# Patient Record
Sex: Male | Born: 1945
Health system: Southern US, Community
[De-identification: ages and names within clinical notes are randomized; demographics above are authoritative.]

## PROBLEM LIST (undated history)

## (undated) ENCOUNTER — Ambulatory Visit (HOSPITAL_COMMUNITY): Payer: Medicare Other

## (undated) DIAGNOSIS — T8859XA Other complications of anesthesia, initial encounter: Secondary | ICD-10-CM

## (undated) DIAGNOSIS — I6529 Occlusion and stenosis of unspecified carotid artery: Secondary | ICD-10-CM

## (undated) DIAGNOSIS — I10 Essential (primary) hypertension: Secondary | ICD-10-CM

## (undated) DIAGNOSIS — T4145XA Adverse effect of unspecified anesthetic, initial encounter: Secondary | ICD-10-CM

## (undated) DIAGNOSIS — E785 Hyperlipidemia, unspecified: Secondary | ICD-10-CM

## (undated) DIAGNOSIS — I739 Peripheral vascular disease, unspecified: Secondary | ICD-10-CM

## (undated) DIAGNOSIS — J189 Pneumonia, unspecified organism: Secondary | ICD-10-CM

## (undated) DIAGNOSIS — I639 Cerebral infarction, unspecified: Secondary | ICD-10-CM

## (undated) DIAGNOSIS — M1990 Unspecified osteoarthritis, unspecified site: Secondary | ICD-10-CM

## (undated) HISTORY — DX: Hyperlipidemia, unspecified: E78.5

## (undated) HISTORY — PX: CAROTID ENDARTERECTOMY: SUR193

## (undated) HISTORY — PX: TONSILLECTOMY: SUR1361

## (undated) HISTORY — PX: SHOULDER SURGERY: SHX246

## (undated) HISTORY — DX: Occlusion and stenosis of unspecified carotid artery: I65.29

---

## 2005-08-31 ENCOUNTER — Encounter: Admission: RE | Admit: 2005-08-31 | Discharge: 2005-08-31 | Payer: Self-pay | Admitting: Sports Medicine

## 2011-06-19 ENCOUNTER — Emergency Department (INDEPENDENT_AMBULATORY_CARE_PROVIDER_SITE_OTHER)
Admission: EM | Admit: 2011-06-19 | Discharge: 2011-06-19 | Disposition: A | Discharge status: Home / Self Care | Payer: Medicare Other | Source: Home / Self Care | Attending: Emergency Medicine | Admitting: Emergency Medicine

## 2011-06-19 ENCOUNTER — Encounter (HOSPITAL_COMMUNITY): Payer: Self-pay | Admitting: *Deleted

## 2011-06-19 DIAGNOSIS — H6120 Impacted cerumen, unspecified ear: Secondary | ICD-10-CM | POA: Diagnosis not present

## 2011-06-19 HISTORY — DX: Essential (primary) hypertension: I10

## 2011-06-19 MED ORDER — NEOMYCIN-POLYMYXIN-HC 3.5-10000-1 OT SUSP: 4.0000 [drp] | Freq: Three times a day (TID) | OTIC | Status: AC

## 2011-06-19 NOTE — ED Notes (Signed)
Pt reports having a cold the past week.  He has had bilateral ear pain the past 2 days.  He denies fever

## 2011-06-19 NOTE — Discharge Instructions (Signed)
Cerumen Impaction  A cerumen impaction is when the wax in your ear forms a plug. This plug usually causes reduced hearing. Sometimes it also causes an earache or dizziness. Removing a cerumen impaction can be difficult and painful. The wax sticks to the ear canal. The canal is sensitive and bleeds easily. If you try to remove a heavy wax buildup with a cotton tipped swab, you may push it in further.  Irrigation with water, suction, and small ear curettes may be used to clear out the wax. If the impaction is fixed to the skin in the ear canal, ear drops may be needed for a few days to loosen the wax. People who build up a lot of wax frequently can use ear wax removal products available in your local drugstore.  SEEK MEDICAL CARE IF:    You develop an earache, increased hearing loss, or marked dizziness.  Document Released: 03/01/2004 Document Revised: 01/11/2011 Document Reviewed: 04/21/2009  [employer] Patient Information 2012 [employer].

## 2011-06-19 NOTE — ED Provider Notes (Signed)
History     CSN: [id number]  Arrival date & time 06/19/11  4098   First MD Initiated Contact with Patient 06/19/11 1050      Chief Complaint  Patient presents with  . Otalgia    (Consider location/radiation/quality/duration/timing/severity/associated sxs/prior treatment) HPI Comments: Patient with ongoing bilateral ear pressure and discomfort for about a week. No fevers, no ear injuries, no recent swimming. Patient describes it in the past he has had ear blockage because of ear wax. And have had ear irrigation as before. Patient denies any fevers, or current sinus congestion, cough stuffy nose or sore throat.  Patient is a 66 y.o. male presenting with ear pain.  Otalgia This is a new problem. The current episode started more than 1 week ago. There is pain in both ears. The problem occurs constantly. The problem has not changed since onset.There has been no fever. Pertinent negatives include no ear discharge, no headaches, no rhinorrhea, no cough and no rash.    Past Medical History  Diagnosis Date  . Hypertension     Past Surgical History  Procedure Date  . Shoulder surgery     History reviewed. No pertinent family history.  History  Substance Use Topics  . Smoking status: Never Smoker   . Smokeless tobacco: Not on file  . Alcohol Use: No      Review of Systems  Constitutional: Positive for chills. Negative for fever, diaphoresis, activity change and appetite change.  HENT: Positive for ear pain. Negative for congestion, rhinorrhea and ear discharge.   Respiratory: Negative for cough.   Gastrointestinal: Negative for nausea.  Skin: Negative for rash.  Neurological: Negative for tremors, syncope, weakness and headaches.    Allergies  Amoxicillin  Home Medications   Current Outpatient Rx  Name Route Sig Dispense Refill  . ATENOLOL 50 MG PO TABS Oral Take 50 mg by mouth daily.    . NEOMYCIN-POLYMYXIN-HC 3.5-10000-1 OT SUSP Right Ear Place 4 drops into the  right ear 3 (three) times daily. 7.5 mL 0    BP 165/84  Pulse 51  Temp(Src) 99 F (37.2 C) (Oral)  Resp 18  SpO2 100%  Physical Exam  Nursing note and vitals reviewed. Constitutional: He appears well-developed and well-nourished.  HENT:  Head: Normocephalic.  Right Ear: Tympanic membrane normal. No drainage, swelling or tenderness. Decreased hearing is noted.  Left Ear: Tympanic membrane normal. No drainage, swelling or tenderness. Decreased hearing is noted.  Ears:  Eyes: Conjunctivae are normal.  Neck: Neck supple. No JVD present.  Pulmonary/Chest: No respiratory distress. He has no wheezes.  Lymphadenopathy:    He has no cervical adenopathy.  Skin: Skin is warm. No rash noted. No erythema.    ED Course  Procedures (including critical care time)  Labs Reviewed - No data to display No results found.   1. Cerumen impaction       MDM  Bilateral ear cerumen obstruction. Post irrigation sustaining a secondary superficial abrasion to the outer aspect of his right ear canal. Preventive optic antibiotics prescribed. Patient understand treatment plan and advised of symptoms or further evaluation. No signs of otitis media or any eardrum perforation or any other abnormalities.        Jimmie Molly, MD 06/19/11 [phone]

## 2012-02-22 DIAGNOSIS — H04129 Dry eye syndrome of unspecified lacrimal gland: Secondary | ICD-10-CM | POA: Diagnosis not present

## 2012-02-22 DIAGNOSIS — H40019 Open angle with borderline findings, low risk, unspecified eye: Secondary | ICD-10-CM | POA: Diagnosis not present

## 2012-02-22 DIAGNOSIS — H2510 Age-related nuclear cataract, unspecified eye: Secondary | ICD-10-CM | POA: Diagnosis not present

## 2012-07-08 DIAGNOSIS — S61209A Unspecified open wound of unspecified finger without damage to nail, initial encounter: Secondary | ICD-10-CM | POA: Diagnosis not present

## 2012-08-28 DIAGNOSIS — H40019 Open angle with borderline findings, low risk, unspecified eye: Secondary | ICD-10-CM | POA: Diagnosis not present

## 2013-03-25 DIAGNOSIS — H2510 Age-related nuclear cataract, unspecified eye: Secondary | ICD-10-CM | POA: Diagnosis not present

## 2013-03-25 DIAGNOSIS — H43399 Other vitreous opacities, unspecified eye: Secondary | ICD-10-CM | POA: Diagnosis not present

## 2013-03-25 DIAGNOSIS — H40019 Open angle with borderline findings, low risk, unspecified eye: Secondary | ICD-10-CM | POA: Diagnosis not present

## 2013-08-01 ENCOUNTER — Encounter (HOSPITAL_COMMUNITY): Payer: Self-pay | Admitting: Emergency Medicine

## 2013-08-01 ENCOUNTER — Emergency Department (INDEPENDENT_AMBULATORY_CARE_PROVIDER_SITE_OTHER)
Admission: EM | Admit: 2013-08-01 | Discharge: 2013-08-01 | Disposition: A | Discharge status: Home / Self Care | Payer: Medicare Other | Source: Home / Self Care | Attending: Family Medicine | Admitting: Family Medicine

## 2013-08-01 DIAGNOSIS — X58XXXA Exposure to other specified factors, initial encounter: Secondary | ICD-10-CM | POA: Diagnosis not present

## 2013-08-01 DIAGNOSIS — S76311A Strain of muscle, fascia and tendon of the posterior muscle group at thigh level, right thigh, initial encounter: Secondary | ICD-10-CM

## 2013-08-01 DIAGNOSIS — IMO0002 Reserved for concepts with insufficient information to code with codable children: Secondary | ICD-10-CM | POA: Diagnosis not present

## 2013-08-01 NOTE — Discharge Instructions (Signed)
Heat, gentle stretch, ibuprofen return as needed.

## 2013-08-01 NOTE — ED Notes (Signed)
C/o right sided possible sciatica pain.  On set 3 to 4 days ago.  Mild relief with antiinflammatory meds.

## 2013-08-01 NOTE — ED Provider Notes (Signed)
CSN: [id number]     Arrival date & time 08/01/13  1227 History   First MD Initiated Contact with Patient 08/01/13 1238     Chief Complaint  Patient presents with  . Sciatica   (Consider location/radiation/quality/duration/timing/severity/associated sxs/prior Treatment) Patient is a 68 y.o. male presenting with hip pain.  Hip Pain This is a new problem. The current episode started more than 2 days ago. The problem has been gradually worsening. Pertinent negatives include no abdominal pain. The symptoms are aggravated by walking.    Past Medical History  Diagnosis Date  . Hypertension    Past Surgical History  Procedure Laterality Date  . Shoulder surgery     History reviewed. No pertinent family history. History  Substance Use Topics  . Smoking status: Never Smoker   . Smokeless tobacco: Not on file  . Alcohol Use: No    Review of Systems  Constitutional: Negative.   Cardiovascular: Negative for leg swelling.  Gastrointestinal: Negative.  Negative for abdominal pain.  Genitourinary: Negative.   Musculoskeletal: Positive for back pain and myalgias. Negative for joint swelling.    Allergies  Amoxicillin  Home Medications   Prior to Admission medications   Medication Sig Start Date End Date Taking? Authorizing Provider  atenolol (TENORMIN) 50 MG tablet Take 50 mg by mouth daily.   Yes Historical Provider, MD   BP 185/89  Pulse 48  Temp(Src) 98.3 F (36.8 C) (Oral)  Resp 14  SpO2 99% Physical Exam  Nursing note and vitals reviewed. Constitutional: He is oriented to person, place, and time. He appears well-developed and well-nourished.  Musculoskeletal: He exhibits tenderness.  Tender right SI-Gluteal area to palpation and with stretching, neg slr, nl heel and toe walking, nl sensation  Neurological: He is alert and oriented to person, place, and time.  Skin: Skin is warm and dry.    ED Course  Procedures (including critical care time) Labs Review Labs  Reviewed - No data to display  Imaging Review No results found.   MDM   1. Hamstring muscle strain, right, initial encounter        Billy Fischer, MD 08/01/13 1323

## 2013-08-04 ENCOUNTER — Emergency Department (INDEPENDENT_AMBULATORY_CARE_PROVIDER_SITE_OTHER): Payer: Medicare Other

## 2013-08-04 ENCOUNTER — Encounter (HOSPITAL_COMMUNITY): Payer: Self-pay | Admitting: Emergency Medicine

## 2013-08-04 ENCOUNTER — Emergency Department (INDEPENDENT_AMBULATORY_CARE_PROVIDER_SITE_OTHER)
Admission: EM | Admit: 2013-08-04 | Discharge: 2013-08-04 | Disposition: A | Discharge status: Home / Self Care | Payer: Medicare Other | Source: Home / Self Care | Attending: Family Medicine | Admitting: Family Medicine

## 2013-08-04 DIAGNOSIS — M76899 Other specified enthesopathies of unspecified lower limb, excluding foot: Secondary | ICD-10-CM

## 2013-08-04 DIAGNOSIS — R937 Abnormal findings on diagnostic imaging of other parts of musculoskeletal system: Secondary | ICD-10-CM | POA: Diagnosis not present

## 2013-08-04 DIAGNOSIS — M7601 Gluteal tendinitis, right hip: Secondary | ICD-10-CM

## 2013-08-04 MED ORDER — KETOROLAC TROMETHAMINE 10 MG PO TABS: 10.0000 mg | ORAL_TABLET | Freq: Four times a day (QID) | ORAL | Status: DC | PRN

## 2013-08-04 MED ORDER — CYCLOBENZAPRINE HCL 5 MG PO TABS: 5.0000 mg | ORAL_TABLET | Freq: Three times a day (TID) | ORAL | Status: DC | PRN

## 2013-08-04 MED ORDER — KETOROLAC TROMETHAMINE 30 MG/ML IJ SOLN
30.0000 mg | Freq: Once | INTRAMUSCULAR | Status: AC
  Administered 2013-08-04: 30 mg via INTRAMUSCULAR

## 2013-08-04 MED ORDER — KETOROLAC TROMETHAMINE 30 MG/ML IJ SOLN
INTRAMUSCULAR | Status: AC
  Filled 2013-08-04: qty 1

## 2013-08-04 NOTE — Discharge Instructions (Signed)
Ice or heat as nbeneficial, use medicine as needed, no biking for 7-10 d., return if needed.

## 2013-08-04 NOTE — ED Provider Notes (Signed)
CSN: [id number]     Arrival date & time 08/04/13  0802 History   First MD Initiated Contact with Patient 08/04/13 0813     Chief Complaint  Patient presents with  . Follow-up   (Consider location/radiation/quality/duration/timing/severity/associated sxs/prior Treatment) Patient is a 68 y.o. male presenting with leg pain. The history is provided by the patient.  Leg Pain Location:  Buttock Time since incident:  1 week Injury: no   Buttock location:  R buttock Pain details:    Quality:  Burning   Severity:  Moderate   Onset quality:  Gradual Chronicity:  New Dislocation: no   Prior injury to area:  No Ineffective treatments:  Heat and rest Associated symptoms: no back pain, no decreased ROM, no muscle weakness and no numbness     Past Medical History  Diagnosis Date  . Hypertension    Past Surgical History  Procedure Laterality Date  . Shoulder surgery     History reviewed. No pertinent family history. History  Substance Use Topics  . Smoking status: Never Smoker   . Smokeless tobacco: Not on file  . Alcohol Use: No    Review of Systems  Constitutional: Negative.   Musculoskeletal: Positive for gait problem. Negative for back pain, joint swelling and myalgias.  Skin: Negative.     Allergies  Amoxicillin  Home Medications   Prior to Admission medications   Medication Sig Start Date End Date Taking? Authorizing Provider  atenolol (TENORMIN) 50 MG tablet Take 50 mg by mouth daily.    Historical Provider, MD  cyclobenzaprine (FLEXERIL) 5 MG tablet Take 1 tablet (5 mg total) by mouth 3 (three) times daily as needed for muscle spasms. 08/04/13   Billy Fischer, MD  ketorolac (TORADOL) 10 MG tablet Take 1 tablet (10 mg total) by mouth every 6 (six) hours as needed. For pain 08/04/13   Billy Fischer, MD   BP 192/94  Pulse 50  Temp(Src) 99.1 F (37.3 C) (Oral)  Resp 14  SpO2 99% Physical Exam  Nursing note and vitals reviewed. Constitutional: He is oriented to  person, place, and time. He appears well-developed and well-nourished.  Musculoskeletal: He exhibits tenderness.       Arms: Neurological: He is alert and oriented to person, place, and time.  Skin: Skin is warm and dry.    ED Course  Procedures (including critical care time) Labs Review Labs Reviewed - No data to display  Imaging Review Dg Hip Complete Right  08/04/2013   CLINICAL DATA:  FOLLOW-UP  EXAM: RIGHT HIP - COMPLETE 2+ VIEW  COMPARISON:  None.  FINDINGS: There is no evidence of hip fracture or dislocation. There is no evidence of arthropathy or other focal bone abnormality.  IMPRESSION: Negative.   Electronically Signed   By: Margaree Mackintosh M.D.   On: 08/04/2013 09:41     MDM   1. Gluteal tendonitis of right buttock        Billy Fischer, MD 08/04/13 [phone]

## 2013-08-04 NOTE — ED Notes (Signed)
Pt  Reports  Pain  r  Lower  Leg       With  Pain radiating  Down the  Affected  Leg    Seen    ucc  4  Days ago  Continues  To  Have  Symptoms

## 2013-08-13 ENCOUNTER — Encounter (HOSPITAL_COMMUNITY): Payer: Self-pay | Admitting: Emergency Medicine

## 2013-08-13 ENCOUNTER — Emergency Department (INDEPENDENT_AMBULATORY_CARE_PROVIDER_SITE_OTHER)
Admission: EM | Admit: 2013-08-13 | Discharge: 2013-08-13 | Disposition: A | Discharge status: Home / Self Care | Payer: Medicare Other | Source: Home / Self Care | Attending: Emergency Medicine | Admitting: Emergency Medicine

## 2013-08-13 DIAGNOSIS — I10 Essential (primary) hypertension: Secondary | ICD-10-CM | POA: Diagnosis not present

## 2013-08-13 DIAGNOSIS — M5430 Sciatica, unspecified side: Secondary | ICD-10-CM

## 2013-08-13 DIAGNOSIS — M5431 Sciatica, right side: Secondary | ICD-10-CM

## 2013-08-13 MED ORDER — MELOXICAM 7.5 MG PO TABS: 7.5000 mg | ORAL_TABLET | Freq: Every day | ORAL | Status: DC

## 2013-08-13 NOTE — ED Provider Notes (Signed)
CSN: [id number]     Arrival date & time 08/13/13  0805 History   First MD Initiated Contact with Patient 08/13/13 0825     Chief Complaint  Patient presents with  . Sciatica   (Consider location/radiation/quality/duration/timing/severity/associated sxs/prior Treatment) HPI Comments: Patient presents requesting refill of medication used for chronic right sided sciatica. Used OTC Aleve at home yesterday with some relief. States symptoms wax and wane, but always occur at right posterior hip and are exacerbated by movement and ambulation. Denies fever, GI or GU sx or anesthesia of genital region. No incontinence or abdominal pain.  PCP: none  The history is provided by the patient.    Past Medical History  Diagnosis Date  . Hypertension    Past Surgical History  Procedure Laterality Date  . Shoulder surgery     History reviewed. No pertinent family history. History  Substance Use Topics  . Smoking status: Never Smoker   . Smokeless tobacco: Not on file  . Alcohol Use: No    Review of Systems  All other systems reviewed and are negative.   Allergies  Amoxicillin  Home Medications   Prior to Admission medications   Medication Sig Start Date End Date Taking? Authorizing Provider  atenolol (TENORMIN) 50 MG tablet Take 50 mg by mouth daily.   Yes Historical Provider, MD  cyclobenzaprine (FLEXERIL) 5 MG tablet Take 1 tablet (5 mg total) by mouth 3 (three) times daily as needed for muscle spasms. 08/04/13   Billy Fischer, MD  ketorolac (TORADOL) 10 MG tablet Take 1 tablet (10 mg total) by mouth every 6 (six) hours as needed. For pain 08/04/13   Billy Fischer, MD  meloxicam (MOBIC) 7.5 MG tablet Take 1 tablet (7.5 mg total) by mouth daily. 08/13/13   Annett Gula Tagan Bartram, PA   BP 178/101  Pulse 51  Temp(Src) 98 F (36.7 C) (Oral)  Resp 16  SpO2 99% Physical Exam  Nursing note and vitals reviewed. Constitutional: He is oriented to person, place, and time. He appears well-developed  and well-nourished. No distress.  HENT:  Head: Normocephalic and atraumatic.  Eyes: Conjunctivae are normal.  Cardiovascular: Normal rate, regular rhythm and normal heart sounds.   Pulmonary/Chest: Effort normal and breath sounds normal.  Abdominal: Soft. Bowel sounds are normal. He exhibits no distension and no mass. There is no tenderness. There is no rebound and no guarding.  Musculoskeletal: Normal range of motion.       Lumbar back: He exhibits tenderness. He exhibits normal range of motion, no bony tenderness, no swelling, no edema, no deformity, no laceration, no pain, no spasm and normal pulse.       Back:  +valgus deformity of left knee No rash CSM exam of RLE normal and intact  Neurological: He is alert and oriented to person, place, and time. He has normal reflexes. He exhibits normal muscle tone. Coordination normal.  Skin: Skin is warm and dry.  Psychiatric: He has a normal mood and affect. His behavior is normal.    ED Course  Procedures (including critical care time) Labs Review Labs Reviewed - No data to display  Imaging Review No results found.   MDM   1. Sciatica of right side   2. Essential hypertension    I had a conversation with the patient about his uncontrolled hypertension and the need to locate a primary care provider so that this can be addressed and treated. Educated patient about risks associated with uncontrolled hypertension. Given chronicity of  patient's back pain issues and modified gait secondary to valgus deformity of left knee, suggested he take daily does of Mobic 7.5mg  to try to minimized acute pain exacerbations.    [place], [place] 08/13/13 [phone]

## 2013-08-13 NOTE — Discharge Instructions (Signed)
Please make every effort to locate primary care physician for follow up regarding your elevated blood pressure. Medication as directed for back pain.   Hypertension Hypertension, commonly called high blood pressure, is when the force of blood pumping through your arteries is too strong. Your arteries are the blood vessels that carry blood from your heart throughout your body. A blood pressure reading consists of a higher number over a lower number, such as 110/72. The higher number (systolic) is the pressure inside your arteries when your heart pumps. The lower number (diastolic) is the pressure inside your arteries when your heart relaxes. Ideally you want your blood pressure below 120/80. Hypertension forces your heart to work harder to pump blood. Your arteries may become narrow or stiff. Having hypertension puts you at risk for heart disease, stroke, and other problems.  RISK FACTORS Some risk factors for high blood pressure are controllable. Others are not.  Risk factors you cannot control include:   Race. You may be at higher risk if you are African American.  Age. Risk increases with age.  Gender. Men are at higher risk than women before age 38 years. After age 68, women are at higher risk than men. Risk factors you can control include:  Not getting enough exercise or physical activity.  Being overweight.  Getting too much fat, sugar, calories, or salt in your diet.  Drinking too much alcohol. SIGNS AND SYMPTOMS Hypertension does not usually cause signs or symptoms. Extremely high blood pressure (hypertensive crisis) may cause headache, anxiety, shortness of breath, and nosebleed. DIAGNOSIS  To check if you have hypertension, your health care provider will measure your blood pressure while you are seated, with your arm held at the level of your heart. It should be measured at least twice using the same arm. Certain conditions can cause a difference in blood pressure between your  right and left arms. A blood pressure reading that is higher than normal on one occasion does not mean that you need treatment. If one blood pressure reading is high, ask your health care provider about having it checked again. TREATMENT  Treating high blood pressure includes making lifestyle changes and possibly taking medication. Living a healthy lifestyle can help lower high blood pressure. You may need to change some of your habits. Lifestyle changes may include:  Following the DASH diet. This diet is high in fruits, vegetables, and whole grains. It is low in salt, red meat, and added sugars.  Getting at least 2 1/2 hours of brisk physical activity every week.  Losing weight if necessary.  Not smoking.  Limiting alcoholic beverages.  Learning ways to reduce stress. If lifestyle changes are not enough to get your blood pressure under control, your health care provider may prescribe medicine. You may need to take more than one. Work closely with your health care provider to understand the risks and benefits. HOME CARE INSTRUCTIONS  Have your blood pressure rechecked as directed by your health care provider.   Only take medicine as directed by your health care provider. Follow the directions carefully. Blood pressure medicines must be taken as prescribed. The medicine does not work as well when you skip doses. Skipping doses also puts you at risk for problems.   Do not smoke.   Monitor your blood pressure at home as directed by your health care provider. SEEK MEDICAL CARE IF:   You think you are having a reaction to medicines taken.  You have recurrent headaches or feel dizzy.  You have swelling in your ankles.  You have trouble with your vision. SEEK IMMEDIATE MEDICAL CARE IF:  You develop a severe headache or confusion.  You have unusual weakness, numbness, or feel faint.  You have severe chest or abdominal pain.  You vomit repeatedly.  You have trouble  breathing. MAKE SURE YOU:   Understand these instructions.  Will watch your condition.  Will get help right away if you are not doing well or get worse. Document Released: 01/22/2005 Document Revised: 01/27/2013 Document Reviewed: 11/14/2012 [facility] Patient Information 2015 [place], [place]. This information is not intended to replace advice given to you by your health care provider. Make sure you discuss any questions you have with your health care provider.  Sciatica Sciatica is pain, weakness, numbness, or tingling along the path of the sciatic nerve. The nerve starts in the lower back and runs down the back of each leg. The nerve controls the muscles in the lower leg and in the back of the knee, while also providing sensation to the back of the thigh, lower leg, and the sole of your foot. Sciatica is a symptom of another medical condition. For instance, nerve damage or certain conditions, such as a herniated disk or bone spur on the spine, pinch or put pressure on the sciatic nerve. This causes the pain, weakness, or other sensations normally associated with sciatica. Generally, sciatica only affects one side of the body. CAUSES   Herniated or slipped disc.  Degenerative disk disease.  A pain disorder involving the narrow muscle in the buttocks (piriformis syndrome).  Pelvic injury or fracture.  Pregnancy.  Tumor (rare). SYMPTOMS  Symptoms can vary from mild to very severe. The symptoms usually travel from the low back to the buttocks and down the back of the leg. Symptoms can include:  Mild tingling or dull aches in the lower back, leg, or hip.  Numbness in the back of the calf or sole of the foot.  Burning sensations in the lower back, leg, or hip.  Sharp pains in the lower back, leg, or hip.  Leg weakness.  Severe back pain inhibiting movement. These symptoms may get worse with coughing, sneezing, laughing, or prolonged sitting or standing. Also, being overweight may  worsen symptoms. DIAGNOSIS  Your caregiver will perform a physical exam to look for common symptoms of sciatica. He or she may ask you to do certain movements or activities that would trigger sciatic nerve pain. Other tests may be performed to find the cause of the sciatica. These may include:  Blood tests.  X-rays.  Imaging tests, such as an MRI or CT scan. TREATMENT  Treatment is directed at the cause of the sciatic pain. Sometimes, treatment is not necessary and the pain and discomfort goes away on its own. If treatment is needed, your caregiver may suggest:  Over-the-counter medicines to relieve pain.  Prescription medicines, such as anti-inflammatory medicine, muscle relaxants, or narcotics.  Applying heat or ice to the painful area.  Steroid injections to lessen pain, irritation, and inflammation around the nerve.  Reducing activity during periods of pain.  Exercising and stretching to strengthen your abdomen and improve flexibility of your spine. Your caregiver may suggest losing weight if the extra weight makes the back pain worse.  Physical therapy.  Surgery to eliminate what is pressing or pinching the nerve, such as a bone spur or part of a herniated disk. HOME CARE INSTRUCTIONS   Only take over-the-counter or prescription medicines for pain or discomfort as  directed by your caregiver.  Apply ice to the affected area for 20 minutes, 3-4 times a day for the first 48-72 hours. Then try heat in the same way.  Exercise, stretch, or perform your usual activities if these do not aggravate your pain.  Attend physical therapy sessions as directed by your caregiver.  Keep all follow-up appointments as directed by your caregiver.  Do not wear high heels or shoes that do not provide proper support.  Check your mattress to see if it is too soft. A firm mattress may lessen your pain and discomfort. SEEK IMMEDIATE MEDICAL CARE IF:   You lose control of your bowel or bladder  (incontinence).  You have increasing weakness in the lower back, pelvis, buttocks, or legs.  You have redness or swelling of your back.  You have a burning sensation when you urinate.  You have pain that gets worse when you lie down or awakens you at night.  Your pain is worse than you have experienced in the past.  Your pain is lasting longer than 4 weeks.  You are suddenly losing weight without reason. MAKE SURE YOU:  Understand these instructions.  Will watch your condition.  Will get help right away if you are not doing well or get worse. Document Released: 01/16/2001 Document Revised: 07/24/2011 Document Reviewed: 06/03/2011 [facility] Patient Information 2015 [place], [place]. This information is not intended to replace advice given to you by your health care provider. Make sure you discuss any questions you have with your health care provider.

## 2013-08-13 NOTE — ED Provider Notes (Signed)
Medical screening examination/treatment/procedure(s) were performed by non-physician practitioner and as supervising physician I was immediately available for consultation/collaboration.  Philipp Deputy, M.D.  Harden Mo, MD 08/13/13 [phone]

## 2013-08-13 NOTE — ED Notes (Signed)
Here for follow up of sciatica.  Pain comes and goes.  Mild relief with Rx.  Out meds needs refill.

## 2013-08-14 DIAGNOSIS — M1710 Unilateral primary osteoarthritis, unspecified knee: Secondary | ICD-10-CM | POA: Diagnosis not present

## 2013-08-14 DIAGNOSIS — M25569 Pain in unspecified knee: Secondary | ICD-10-CM | POA: Diagnosis not present

## 2013-08-28 ENCOUNTER — Encounter (HOSPITAL_COMMUNITY): Payer: Self-pay | Admitting: Emergency Medicine

## 2013-08-28 ENCOUNTER — Emergency Department (INDEPENDENT_AMBULATORY_CARE_PROVIDER_SITE_OTHER)
Admission: EM | Admit: 2013-08-28 | Discharge: 2013-08-28 | Disposition: A | Discharge status: Home / Self Care | Payer: Medicare Other | Source: Home / Self Care | Attending: Family Medicine | Admitting: Family Medicine

## 2013-08-28 DIAGNOSIS — M5430 Sciatica, unspecified side: Secondary | ICD-10-CM

## 2013-08-28 DIAGNOSIS — M5431 Sciatica, right side: Secondary | ICD-10-CM

## 2013-08-28 MED ORDER — MELOXICAM 7.5 MG PO TABS: 7.5000 mg | ORAL_TABLET | Freq: Two times a day (BID) | ORAL | Status: DC

## 2013-08-28 NOTE — ED Notes (Signed)
C/o continued pain in back and into leg, seems to be getting worse. BP elevated.  (has surgery scheduled in November for knee)

## 2013-08-28 NOTE — Discharge Instructions (Signed)
Use ice and medicine as needed, monitor your bp as discussed, return or see your doctor if further problems.

## 2013-08-28 NOTE — ED Provider Notes (Signed)
CSN: [id number]     Arrival date & time 08/28/13  0802 History   First MD Initiated Contact with Patient 08/28/13 [phone]     Chief Complaint  Patient presents with  . Back Pain   (Consider location/radiation/quality/duration/timing/severity/associated sxs/prior Treatment) Patient is a 68 y.o. male presenting with back pain. The history is provided by the patient.  Back Pain Location:  Gluteal region Quality:  Burning Radiates to:  R posterior upper leg Pain severity:  Mild Pain is:  Worse during the night Onset quality:  Gradual Chronicity:  New Context comment:  Meds for sciatica more or less helping, good and bad days and times. Relieved by:  Cold packs and NSAIDs Associated symptoms: paresthesias   Associated symptoms: no perianal numbness and no tingling     Past Medical History  Diagnosis Date  . Hypertension    Past Surgical History  Procedure Laterality Date  . Shoulder surgery     History reviewed. No pertinent family history. History  Substance Use Topics  . Smoking status: Never Smoker   . Smokeless tobacco: Not on file  . Alcohol Use: No    Review of Systems  Constitutional: Negative.   Gastrointestinal: Negative.   Genitourinary: Negative.   Musculoskeletal: Positive for back pain.  Neurological: Positive for paresthesias. Negative for tingling.    Allergies  Amoxicillin  Home Medications   Prior to Admission medications   Medication Sig Start Date End Date Taking? Authorizing Provider  atenolol (TENORMIN) 50 MG tablet Take 50 mg by mouth daily.   Yes Historical Provider, MD  cyclobenzaprine (FLEXERIL) 5 MG tablet Take 1 tablet (5 mg total) by mouth 3 (three) times daily as needed for muscle spasms. 08/04/13   Billy Fischer, MD  ketorolac (TORADOL) 10 MG tablet Take 1 tablet (10 mg total) by mouth every 6 (six) hours as needed. For pain 08/04/13   Billy Fischer, MD  meloxicam (MOBIC) 7.5 MG tablet Take 1 tablet (7.5 mg total) by mouth daily. 08/13/13    Lahoma Rocker, PA  meloxicam (MOBIC) 7.5 MG tablet Take 1 tablet (7.5 mg total) by mouth 2 (two) times daily after a meal. 08/28/13   Billy Fischer, MD   BP 187/90  Pulse 53  Temp(Src) 97.9 F (36.6 C) (Oral)  Resp 16  SpO2 98% Physical Exam  Nursing note and vitals reviewed. Constitutional: He is oriented to person, place, and time. He appears well-developed and well-nourished.  Cardiovascular: Regular rhythm and normal heart sounds.   Pulmonary/Chest: Effort normal and breath sounds normal.  Musculoskeletal: He exhibits no edema and no tenderness.  Neurological: He is alert and oriented to person, place, and time.  Skin: Skin is warm and dry.    ED Course  Procedures (including critical care time) Labs Review Labs Reviewed - No data to display  Imaging Review No results found.   MDM   1. Sciatica neuralgia, right        Billy Fischer, MD 08/28/13 [phone]

## 2013-09-04 ENCOUNTER — Encounter (HOSPITAL_COMMUNITY): Payer: Self-pay | Admitting: Emergency Medicine

## 2013-09-04 ENCOUNTER — Emergency Department (INDEPENDENT_AMBULATORY_CARE_PROVIDER_SITE_OTHER)
Admission: EM | Admit: 2013-09-04 | Discharge: 2013-09-04 | Disposition: A | Discharge status: Home / Self Care | Payer: Medicare Other | Source: Home / Self Care | Attending: Family Medicine | Admitting: Family Medicine

## 2013-09-04 DIAGNOSIS — I10 Essential (primary) hypertension: Secondary | ICD-10-CM | POA: Diagnosis not present

## 2013-09-04 NOTE — Discharge Instructions (Signed)
Continue home monitoring randomly, recheck as needed.

## 2013-09-04 NOTE — ED Provider Notes (Signed)
CSN: [id number]     Arrival date & time 09/04/13  0802 History   First MD Initiated Contact with Patient 09/04/13 [phone]     Chief Complaint  Patient presents with  . Hypertension   (Consider location/radiation/quality/duration/timing/severity/associated sxs/prior Treatment) Patient is a 68 y.o. male presenting with hypertension. The history is provided by the patient.  Hypertension This is a chronic problem. The current episode started more than 1 week ago (home monitoring variable wnl.). The problem has not changed since onset.Pertinent negatives include no chest pain, no abdominal pain, no headaches and no shortness of breath.    Past Medical History  Diagnosis Date  . Hypertension    Past Surgical History  Procedure Laterality Date  . Shoulder surgery     History reviewed. No pertinent family history. History  Substance Use Topics  . Smoking status: Never Smoker   . Smokeless tobacco: Not on file  . Alcohol Use: No    Review of Systems  Constitutional: Negative.   Respiratory: Negative for chest tightness and shortness of breath.   Cardiovascular: Negative for chest pain and palpitations.  Gastrointestinal: Negative for abdominal pain.  Neurological: Negative for headaches.    Allergies  Amoxicillin  Home Medications   Prior to Admission medications   Medication Sig Start Date End Date Taking? Authorizing Provider  atenolol (TENORMIN) 50 MG tablet Take 50 mg by mouth daily.    Historical Provider, MD  cyclobenzaprine (FLEXERIL) 5 MG tablet Take 1 tablet (5 mg total) by mouth 3 (three) times daily as needed for muscle spasms. 08/04/13   Billy Fischer, MD  ketorolac (TORADOL) 10 MG tablet Take 1 tablet (10 mg total) by mouth every 6 (six) hours as needed. For pain 08/04/13   Billy Fischer, MD  meloxicam (MOBIC) 7.5 MG tablet Take 1 tablet (7.5 mg total) by mouth daily. 08/13/13   Lahoma Rocker, PA  meloxicam (MOBIC) 7.5 MG tablet Take 1 tablet (7.5 mg total) by  mouth 2 (two) times daily after a meal. 08/28/13   Billy Fischer, MD   BP 175/80  Pulse 49  Temp(Src) 97.7 F (36.5 C) (Oral)  SpO2 99% Physical Exam  Nursing note and vitals reviewed. Constitutional: He is oriented to person, place, and time. He appears well-developed and well-nourished.  Neck: Normal range of motion. Neck supple.  Cardiovascular: Normal heart sounds.   Pulmonary/Chest: Breath sounds normal.  Musculoskeletal: He exhibits no edema.  Neurological: He is alert and oriented to person, place, and time.  Skin: Skin is warm and dry.    ED Course  Procedures (including critical care time) Labs Review Labs Reviewed - No data to display  Imaging Review No results found.   MDM   1. Essential hypertension        Billy Fischer, MD 09/04/13 [phone]

## 2013-09-04 NOTE — ED Notes (Signed)
Pt      Here  For  A  Check  Of    Blood  Pressure                 He  Is  In no  Acute  Distress       He  Is  Here  To  See  Dr Juventino Slovak

## 2013-11-02 ENCOUNTER — Ambulatory Visit: Payer: Medicare Other | Admitting: Internal Medicine

## 2013-11-30 ENCOUNTER — Ambulatory Visit (INDEPENDENT_AMBULATORY_CARE_PROVIDER_SITE_OTHER): Payer: Medicare Other | Admitting: Family

## 2013-11-30 ENCOUNTER — Ambulatory Visit: Payer: Medicare Other | Admitting: Family

## 2013-11-30 ENCOUNTER — Encounter: Payer: Self-pay | Admitting: Family

## 2013-11-30 VITALS — BP 140/90 | HR 66 | Ht 64.5 in | Wt 152.0 lb

## 2013-11-30 DIAGNOSIS — N400 Enlarged prostate without lower urinary tract symptoms: Secondary | ICD-10-CM | POA: Diagnosis not present

## 2013-11-30 DIAGNOSIS — M1712 Unilateral primary osteoarthritis, left knee: Secondary | ICD-10-CM | POA: Diagnosis not present

## 2013-11-30 DIAGNOSIS — Z125 Encounter for screening for malignant neoplasm of prostate: Secondary | ICD-10-CM | POA: Diagnosis not present

## 2013-11-30 DIAGNOSIS — I10 Essential (primary) hypertension: Secondary | ICD-10-CM | POA: Diagnosis not present

## 2013-11-30 DIAGNOSIS — Z23 Encounter for immunization: Secondary | ICD-10-CM

## 2013-11-30 LAB — HEPATIC FUNCTION PANEL
ALBUMIN: 3.3 g/dL — AB (ref 3.5–5.2)
ALT: 19 U/L (ref 0–53)
AST: 27 U/L (ref 0–37)
Alkaline Phosphatase: 54 U/L (ref 39–117)
BILIRUBIN DIRECT: 0.1 mg/dL (ref 0.0–0.3)
Total Bilirubin: 0.7 mg/dL (ref 0.2–1.2)
Total Protein: 6.8 g/dL (ref 6.0–8.3)

## 2013-11-30 LAB — BASIC METABOLIC PANEL
BUN: 16 mg/dL (ref 6–23)
CALCIUM: 9.6 mg/dL (ref 8.4–10.5)
CHLORIDE: 104 meq/L (ref 96–112)
CO2: 26 meq/L (ref 19–32)
Creatinine, Ser: 0.8 mg/dL (ref 0.4–1.5)
GFR: 97.35 mL/min (ref >60.00)
GLUCOSE: 98 mg/dL (ref 70–99)
POTASSIUM: 5.3 meq/L — AB (ref 3.5–5.1)
Sodium: 140 mEq/L (ref 135–145)

## 2013-11-30 LAB — POCT URINALYSIS DIPSTICK
Bilirubin, UA: NEGATIVE
Blood, UA: NEGATIVE
Glucose, UA: NEGATIVE
Ketones, UA: NEGATIVE
LEUKOCYTES UA: NEGATIVE
NITRITE UA: NEGATIVE
PH UA: 7.5
PROTEIN UA: NEGATIVE
Spec Grav, UA: 1.015
UROBILINOGEN UA: 0.2

## 2013-11-30 LAB — CBC WITH DIFFERENTIAL/PLATELET
Basophils Absolute: 0 10*3/uL (ref 0.0–0.1)
Basophils Relative: 0.3 % (ref 0.0–3.0)
EOS ABS: 0.2 10*3/uL (ref 0.0–0.7)
EOS PCT: 3.1 % (ref 0.0–5.0)
HCT: 40.6 % (ref 39.0–52.0)
Hemoglobin: 13.3 g/dL (ref 13.0–17.0)
LYMPHS ABS: 1.2 10*3/uL (ref 0.7–4.0)
LYMPHS PCT: 19.7 % (ref 12.0–46.0)
MCHC: 32.7 g/dL (ref 30.0–36.0)
MCV: 96.7 fl (ref 78.0–100.0)
MONO ABS: 0.5 10*3/uL (ref 0.1–1.0)
Monocytes Relative: 8.3 % (ref 3.0–12.0)
NEUTROS PCT: 68.6 % (ref 43.0–77.0)
Neutro Abs: 4 10*3/uL (ref 1.4–7.7)
Platelets: 319 10*3/uL (ref 150.0–400.0)
RBC: 4.2 Mil/uL — ABNORMAL LOW (ref 4.22–5.81)
RDW: 13.4 % (ref 11.5–15.5)
WBC: 5.9 10*3/uL (ref 4.0–10.5)

## 2013-11-30 LAB — PSA: PSA: 1.22 ng/mL (ref 0.10–4.00)

## 2013-11-30 MED ORDER — ATENOLOL 50 MG PO TABS: 50.0000 mg | ORAL_TABLET | Freq: Every day | ORAL | Status: DC

## 2013-11-30 NOTE — Patient Instructions (Signed)

## 2013-11-30 NOTE — Progress Notes (Signed)
Pre visit review using our clinic review tool, if applicable. No additional management support is needed unless otherwise documented below in the visit note. 

## 2013-11-30 NOTE — Progress Notes (Signed)
Subjective:    Patient ID: Ricky [name], male    DOB: Jun 20, 1945, 68 y.o.   MRN: [id number]  HPI 68 year old white male, nonsmoker, is in today to be established and for surgical clearance. He has not seen a primary care provider in over 5 years. Is being seen by [facility] orthopedics who has suggested that he have knee replacement surgery to the left knee next month. Denies are cardiac issues. Has a history of HTN and taking Atenolol for many years. Exercises daily and has a known bradycardic heart rate as a result.    Review of Systems  Constitutional: Negative.   HENT: Negative.   Eyes: Negative.   Respiratory: Negative.   Cardiovascular: Negative.   Gastrointestinal: Negative.   Endocrine: Negative.   Genitourinary: Negative.   Musculoskeletal: Negative.   Skin: Negative.   Allergic/Immunologic: Negative.   Neurological: Negative.   Hematological: Negative.   Psychiatric/Behavioral: Negative.    Past Medical History  Diagnosis Date  . Hypertension     History   Social History  . Marital Status: Married    Spouse Name: N/A    Number of Children: N/A  . Years of Education: N/A   Occupational History  . Not on file.   Social History Main Topics  . Smoking status: Never Smoker   . Smokeless tobacco: Not on file  . Alcohol Use: No  . Drug Use: No  . Sexual Activity: Yes   Other Topics Concern  . Not on file   Social History Narrative  . No narrative on file    Past Surgical History  Procedure Laterality Date  . Shoulder surgery      No family history on file.  Allergies  Allergen Reactions  . Amoxicillin Rash    Current Outpatient Prescriptions on File Prior to Visit  Medication Sig Dispense Refill  . cyclobenzaprine (FLEXERIL) 5 MG tablet Take 1 tablet (5 mg total) by mouth 3 (three) times daily as needed for muscle spasms.  30 tablet  0  . ketorolac (TORADOL) 10 MG tablet Take 1 tablet (10 mg total) by mouth every 6 (six) hours as needed.  For pain  20 tablet  0  . meloxicam (MOBIC) 7.5 MG tablet Take 1 tablet (7.5 mg total) by mouth 2 (two) times daily after a meal.  30 tablet  1   No current facility-administered medications on file prior to visit.    BP 140/90  Pulse 66  Ht 5' 4.5" (1.638 m)  Wt 152 lb (68.947 kg)  BMI 25.70 kg/m2chart    Objective:   Physical Exam  Constitutional: He is oriented to person, place, and time. He appears well-developed and well-nourished.  HENT:  Right Ear: External ear normal.  Left Ear: External ear normal.  Nose: Nose normal.  Mouth/Throat: Oropharynx is clear and moist.  Neck: Normal range of motion. Neck supple. No thyromegaly present.  Cardiovascular: Normal rate, regular rhythm and normal heart sounds.   Pulmonary/Chest: Effort normal and breath sounds normal.  Abdominal: Soft. Bowel sounds are normal.  Musculoskeletal: Normal range of motion.  Neurological: He is alert and oriented to person, place, and time.  Skin: Skin is warm and dry.  Psychiatric: He has a normal mood and affect.          Assessment & Plan:  Winnie was seen today for no specified reason.  Diagnoses and associated orders for this visit:  Essential hypertension - EKG 12-Lead - CBC with Differential - Basic Metabolic  Panel - Hepatic Function Panel - POC Urinalysis Dipstick  Need for prophylactic vaccination against Streptococcus pneumoniae (pneumococcus) - Pneumococcal conjugate vaccine 13-valent  Primary osteoarthritis of left knee - Basic Metabolic Panel  Screening for prostate cancer  Enlarged prostate - PSA  Other Orders - atenolol (TENORMIN) 50 MG tablet; Take 1 tablet (50 mg total) by mouth daily.   Call the office with any questions or concerns. Recheck in 6 months and sooner as needed. If labs are stable, we will clear for surgery.

## 2013-12-01 ENCOUNTER — Ambulatory Visit: Payer: Self-pay | Admitting: Orthopedic Surgery

## 2013-12-01 ENCOUNTER — Telehealth: Payer: Self-pay | Admitting: Family

## 2013-12-01 ENCOUNTER — Encounter (HOSPITAL_COMMUNITY): Payer: Self-pay | Admitting: Pharmacy Technician

## 2013-12-01 NOTE — Progress Notes (Signed)
Please put orders in Epic surgery 12-14-13 pre op 12-07-13 Thanks

## 2013-12-01 NOTE — Telephone Encounter (Signed)
emmi mailed  °

## 2013-12-01 NOTE — Progress Notes (Signed)
Preoperative surgical orders have been place into the Epic hospital system for [facility] on 12/01/2013, 12:04 PM  by Mickel Crow for surgery on 12/14/2013.  Preop Total Knee orders including Experal, IV Tylenol, and IV Decadron as long as there are no contraindications to the above medications. Arlee Muslim, PA-C

## 2013-12-04 ENCOUNTER — Other Ambulatory Visit (HOSPITAL_COMMUNITY): Payer: Self-pay | Admitting: *Deleted

## 2013-12-04 NOTE — Patient Instructions (Addendum)
Ricky [name]  12/04/2013                           YOUR PROCEDURE IS SCHEDULED ON:  12/14/13                ENTER FROM [address] - GO TO [address]K               LOOK FOR [employer]  / [employer]                              FOLLOW  SIGNS TO SHORT STAY CENTER                 ARRIVE AT SHORT STAY AT: 11:45 am               CALL THIS NUMBER IF ANY PROBLEMS THE DAY OF SURGERY :               [phone]                                REMEMBER:   Do not eat food  AFTER MIDNIGHT              MAY HAVE CLEAR LIQUIDS UNTIL 8:45 AM     CLEAR LIQUID DIET   Foods Allowed                                                                     Foods Excluded  Coffee and tea, regular and decaf                             liquids that you cannot  Plain Jell-O in any flavor                                             see through such as: Fruit ices (not with fruit pulp)                                     milk, soups, orange juice  Iced Popsicles                                    All solid food Carbonated beverages, regular and diet                                    Cranberry, grape and apple juices Sports drinks like Gatorade Lightly seasoned clear broth or consume(fat free) Sugar, honey syrup  _____________________________________________________________________                    Take these medicines the morning of surgery with  A SIPS OF WATER :    ATENOLOL     Do not wear jewelry, make-up   Do not wear lotions, powders, or perfumes.   Do not shave legs or underarms 12 hrs. before surgery (men may shave face)  Do not bring valuables to the hospital.  Contacts, dentures or bridgework may not be worn into surgery.  Leave suitcase in the car. After surgery it may be brought to your room.  For patients admitted to the hospital more than one night, checkout time is            11:00 AM                                                        ________________________________________________________________________                                                                                                  [place]  Before surgery, you can play an important role.  Because skin is not sterile, your skin needs to be as free of germs as possible.  You can reduce the number of germs on your skin by washing with CHG (chlorahexidine gluconate) soap before surgery.  CHG is an antiseptic cleaner which kills germs and bonds with the skin to continue killing germs even after washing. Please DO NOT use if you have an allergy to CHG or antibacterial soaps.  If your skin becomes reddened/irritated stop using the CHG and inform your nurse when you arrive at Short Stay. Do not shave (including legs and underarms) for at least 48 hours prior to the first CHG shower.  You may shave your face. Please follow these instructions carefully:   1.  Shower with CHG Soap the night before surgery and the  morning of Surgery.   2.  If you choose to wash your hair, wash your hair first as usual with your  normal  Shampoo.   3.  After you shampoo, rinse your hair and body thoroughly to remove the  shampoo.                                         4.  Use CHG as you would any other liquid soap.  You can apply chg directly  to the skin and wash . Gently wash with scrungie or clean wascloth    5.  Apply the CHG Soap to your body ONLY FROM THE NECK DOWN.   Do not use on open                           Wound or open sores. Avoid contact with eyes, ears mouth and genitals (private parts).  Genitals (private parts) with your normal soap.              6.  Wash thoroughly, paying special attention to the area where your surgery  will be performed.   7.  Thoroughly rinse your body with warm water from the neck down.   8.  DO NOT shower/wash with your normal soap after using and rinsing off  the CHG Soap .                 9.  Pat yourself dry with a clean towel.             10.  Wear clean pajamas.             11.  Place clean sheets on your bed the night of your first shower and do not  sleep with pets.  Day of Surgery : Do not apply any lotions/deodorants the morning of surgery.  Please wear clean clothes to the hospital/surgery center.  FAILURE TO FOLLOW THESE INSTRUCTIONS MAY RESULT IN THE CANCELLATION OF YOUR SURGERY    PATIENT SIGNATURE_________________________________  ______________________________________________________________________     Ricky [name]  An incentive spirometer is a tool that can help keep your lungs clear and active. This tool measures how well you are filling your lungs with each breath. Taking long deep breaths may help reverse or decrease the chance of developing breathing (pulmonary) problems (especially infection) following:  A long period of time when you are unable to move or be active. BEFORE THE PROCEDURE   If the spirometer includes an indicator to show your best effort, your nurse or respiratory therapist will set it to a desired goal.  If possible, sit up straight or lean slightly forward. Try not to slouch.  Hold the incentive spirometer in an upright position. INSTRUCTIONS FOR USE  1. Sit on the edge of your bed if possible, or sit up as far as you can in bed or on a chair. 2. Hold the incentive spirometer in an upright position. 3. Breathe out normally. 4. Place the mouthpiece in your mouth and seal your lips tightly around it. 5. Breathe in slowly and as deeply as possible, raising the piston or the ball toward the top of the column. 6. Hold your breath for 3-5 seconds or for as long as possible. Allow the piston or ball to fall to the bottom of the column. 7. Remove the mouthpiece from your mouth and breathe out normally. 8. Rest for a few seconds and repeat Steps 1 through 7 at least 10 times every 1-2 hours when you are awake.  Take your time and take a few normal breaths between deep breaths. 9. The spirometer may include an indicator to show your best effort. Use the indicator as a goal to work toward during each repetition. 10. After each set of 10 deep breaths, practice coughing to be sure your lungs are clear. If you have an incision (the cut made at the time of surgery), support your incision when coughing by placing a pillow or rolled up towels firmly against it. Once you are able to get out of bed, walk around indoors and cough well. You may stop using the incentive spirometer when instructed by your caregiver.  RISKS AND COMPLICATIONS  Take your time so you do not get dizzy or light-headed.  If you are in pain, you may need to take or ask for pain medication before doing incentive spirometry. It is harder to take a  deep breath if you are having pain. AFTER USE  Rest and breathe slowly and easily.  It can be helpful to keep track of a log of your progress. Your caregiver can provide you with a simple table to help with this. If you are using the spirometer at home, follow these instructions: [place] IF:   You are having difficultly using the spirometer.  You have trouble using the spirometer as often as instructed.  Your pain medication is not giving enough relief while using the spirometer.  You develop fever of 100.5 F (38.1 C) or higher. SEEK IMMEDIATE MEDICAL CARE IF:   You cough up bloody sputum that had not been present before.  You develop fever of 102 F (38.9 C) or greater.  You develop worsening pain at or near the incision site. MAKE SURE YOU:   Understand these instructions.  Will watch your condition.  Will get help right away if you are not doing well or get worse. Document Released: 06/04/2006 Document Revised: 04/16/2011 Document Reviewed: 08/05/2006 Exit[employer] Patient Information 2014 Exit[employer],  [place].   ________________________________________________________________________  WHAT IS A BLOOD TRANSFUSION? Blood Transfusion Information  A transfusion is the replacement of blood or some of its parts. Blood is made up of multiple cells which provide different functions.  Red blood cells carry oxygen and are used for blood loss replacement.  White blood cells fight against infection.  Platelets control bleeding.  Plasma helps clot blood.  Other blood products are available for specialized needs, such as hemophilia or other clotting disorders. BEFORE THE TRANSFUSION  Who gives blood for transfusions?   Healthy volunteers who are fully evaluated to make sure their blood is safe. This is blood bank blood. Transfusion therapy is the safest it has ever been in the practice of medicine. Before blood is taken from a donor, a complete history is taken to make sure that person has no history of diseases nor engages in risky social behavior (examples are intravenous drug use or sexual activity with multiple partners). The donor's travel history is screened to minimize risk of transmitting infections, such as malaria. The donated blood is tested for signs of infectious diseases, such as HIV and hepatitis. The blood is then tested to be sure it is compatible with you in order to minimize the chance of a transfusion reaction. If you or a relative donates blood, this is often done in anticipation of surgery and is not appropriate for emergency situations. It takes many days to process the donated blood. RISKS AND COMPLICATIONS Although transfusion therapy is very safe and saves many lives, the main dangers of transfusion include:   Getting an infectious disease.  Developing a transfusion reaction. This is an allergic reaction to something in the blood you were given. Every precaution is taken to prevent this. The decision to have a blood transfusion has been considered carefully by your caregiver  before blood is given. Blood is not given unless the benefits outweigh the risks. AFTER THE TRANSFUSION  Right after receiving a blood transfusion, you will usually feel much better and more energetic. This is especially true if your red blood cells have gotten low (anemic). The transfusion raises the level of the red blood cells which carry oxygen, and this usually causes an energy increase.  The nurse administering the transfusion will monitor you carefully for complications. HOME CARE INSTRUCTIONS  No special instructions are needed after a transfusion. You may find your energy is better. Speak with your caregiver about  any limitations on activity for underlying diseases you may have. SEEK MEDICAL CARE IF:   Your condition is not improving after your transfusion.  You develop redness or irritation at the intravenous (IV) site. SEEK IMMEDIATE MEDICAL CARE IF:  Any of the following symptoms occur over the next 12 hours:  Shaking chills.  You have a temperature by mouth above 102 F (38.9 C), not controlled by medicine.  Chest, back, or muscle pain.  People around you feel you are not acting correctly or are confused.  Shortness of breath or difficulty breathing.  Dizziness and fainting.  You get a rash or develop hives.  You have a decrease in urine output.  Your urine turns a dark color or changes to pink, red, or brown. Any of the following symptoms occur over the next 10 days:  You have a temperature by mouth above 102 F (38.9 C), not controlled by medicine.  Shortness of breath.  Weakness after normal activity.  The white part of the eye turns yellow (jaundice).  You have a decrease in the amount of urine or are urinating less often.  Your urine turns a dark color or changes to pink, red, or brown. Document Released: 01/20/2000 Document Revised: 04/16/2011 Document Reviewed: 09/08/2007 [employer] [facility] Patient Information 2014 [place],  [place].  _______________________________________________________________________

## 2013-12-07 ENCOUNTER — Encounter (HOSPITAL_COMMUNITY): Payer: Self-pay

## 2013-12-07 ENCOUNTER — Ambulatory Visit (HOSPITAL_COMMUNITY)
Admission: RE | Admit: 2013-12-07 | Discharge: 2013-12-07 | Disposition: A | Discharge status: Home / Self Care | Payer: Medicare Other | Source: Ambulatory Visit | Attending: Anesthesiology | Admitting: Anesthesiology

## 2013-12-07 ENCOUNTER — Encounter (HOSPITAL_COMMUNITY)
Admission: RE | Admit: 2013-12-07 | Discharge: 2013-12-07 | Disposition: A | Discharge status: Home / Self Care | Payer: Medicare Other | Source: Ambulatory Visit | Attending: Orthopedic Surgery | Admitting: Orthopedic Surgery

## 2013-12-07 DIAGNOSIS — I10 Essential (primary) hypertension: Secondary | ICD-10-CM

## 2013-12-07 DIAGNOSIS — Z01818 Encounter for other preprocedural examination: Secondary | ICD-10-CM | POA: Insufficient documentation

## 2013-12-07 DIAGNOSIS — M179 Osteoarthritis of knee, unspecified: Secondary | ICD-10-CM | POA: Diagnosis not present

## 2013-12-07 HISTORY — DX: Unspecified osteoarthritis, unspecified site: M19.90

## 2013-12-07 LAB — COMPREHENSIVE METABOLIC PANEL
ALK PHOS: 64 U/L (ref 39–117)
ALT: 22 U/L (ref 0–53)
AST: 28 U/L (ref 0–37)
Albumin: 3.6 g/dL (ref 3.5–5.2)
Anion gap: 10 (ref 5–15)
BILIRUBIN TOTAL: 0.4 mg/dL (ref 0.3–1.2)
BUN: 13 mg/dL (ref 6–23)
CHLORIDE: 102 meq/L (ref 96–112)
CO2: 28 meq/L (ref 19–32)
Calcium: 9.2 mg/dL (ref 8.4–10.5)
Creatinine, Ser: 0.78 mg/dL (ref 0.50–1.35)
GFR calc Af Amer: >90 mL/min (ref >90)
GFR, EST NON AFRICAN AMERICAN: 89 mL/min — AB (ref >90)
Glucose, Bld: 113 mg/dL — ABNORMAL HIGH (ref 70–99)
POTASSIUM: 4.7 meq/L (ref 3.7–5.3)
SODIUM: 140 meq/L (ref 137–147)
Total Protein: 7.3 g/dL (ref 6.0–8.3)

## 2013-12-07 LAB — URINALYSIS, ROUTINE W REFLEX MICROSCOPIC
BILIRUBIN URINE: NEGATIVE
Glucose, UA: NEGATIVE mg/dL
HGB URINE DIPSTICK: NEGATIVE
Ketones, ur: NEGATIVE mg/dL
Leukocytes, UA: NEGATIVE
Nitrite: NEGATIVE
Protein, ur: NEGATIVE mg/dL
SPECIFIC GRAVITY, URINE: 1.018 (ref 1.005–1.030)
Urobilinogen, UA: 0.2 mg/dL (ref 0.0–1.0)
pH: 7.5 (ref 5.0–8.0)

## 2013-12-07 LAB — CBC
HCT: 42.4 % (ref 39.0–52.0)
Hemoglobin: 14 g/dL (ref 13.0–17.0)
MCH: 31.8 pg (ref 26.0–34.0)
MCHC: 33 g/dL (ref 30.0–36.0)
MCV: 96.4 fL (ref 78.0–100.0)
PLATELETS: 345 10*3/uL (ref 150–400)
RBC: 4.4 MIL/uL (ref 4.22–5.81)
RDW: 13.2 % (ref 11.5–15.5)
WBC: 6.1 10*3/uL (ref 4.0–10.5)

## 2013-12-07 LAB — APTT: APTT: 32 s (ref 24–37)

## 2013-12-07 LAB — SURGICAL PCR SCREEN
MRSA, PCR: NEGATIVE
Staphylococcus aureus: NEGATIVE

## 2013-12-07 LAB — PROTIME-INR
INR: 0.99 (ref 0.00–1.49)
Prothrombin Time: 13.1 seconds (ref 11.6–15.2)

## 2013-12-07 NOTE — Progress Notes (Signed)
Chest xray results faxed by epic to dr Wynelle Link

## 2013-12-09 ENCOUNTER — Other Ambulatory Visit: Payer: Self-pay | Admitting: Orthopedic Surgery

## 2013-12-09 DIAGNOSIS — R9389 Abnormal findings on diagnostic imaging of other specified body structures: Secondary | ICD-10-CM

## 2013-12-09 NOTE — Progress Notes (Signed)
Fax received from dr Wynelle Link chest ct without contrast ordered for 12-10-13, fax placed on patient chart

## 2013-12-10 ENCOUNTER — Ambulatory Visit
Admission: RE | Admit: 2013-12-10 | Discharge: 2013-12-10 | Disposition: A | Discharge status: Home / Self Care | Payer: Medicare Other | Source: Ambulatory Visit | Attending: Orthopedic Surgery | Admitting: Orthopedic Surgery

## 2013-12-10 DIAGNOSIS — R9389 Abnormal findings on diagnostic imaging of other specified body structures: Secondary | ICD-10-CM

## 2013-12-10 DIAGNOSIS — R599 Enlarged lymph nodes, unspecified: Secondary | ICD-10-CM | POA: Diagnosis not present

## 2013-12-11 NOTE — Progress Notes (Addendum)
CT  chest done 12/10/2013 routed to Dr Wynelle Link via [facility]

## 2013-12-13 ENCOUNTER — Ambulatory Visit: Payer: Self-pay | Admitting: Orthopedic Surgery

## 2013-12-13 NOTE — H&P (Signed)
Ricky [name]. Ricky [name] DOB: [birth year] Married / Language: English / Race: White Male Date of Admission:  12-14-2013 Chief Complaint:  Left Knee Pain History of Present Illness  The patient is a 68 year old male who comes in  for a preoperative history and physical. The patient is scheduled for a left total knee arthroplasty to be performed by Dr. Dione Plover. Aluisio, MD at [facility] on 12/14/2013. The patient reports left knee symptoms including: pain and swelling which began 6 year(s) ago without any known injury. The patient describes the severity of the symptoms as moderate in severity.The patient feels that the symptoms are worsening. He reports that he did have a football injury on the left knee when he was in high school in which he had a valgus stress placed on the knee. He has had increasing problems with the knee over the past few years. He has modified his activity to the point where he cannot even walk long distances. He does continue to ride the bike and do some weight training. He has never had any injections in the knee. No groin pain. No pain in the right knee. Does have some issues with sciatica but more on the right. He has recently started Mobic which is not providing him with much relief. He is at a point where he is tired of the pain and tired of having to give up things that he likes to do. He is having a significant dysfunction from his left knee. The only thing he can basically do now is ride a bicycle. He states that makes the knee feel better. All other activities make the knee feel worse. This limits what he can and can not do. He has not had injections in the past. He is not really interested in doing them. He said his function is so compromised now that he needs to do something to get his function back. He does have a lot of pain with this also. The pain is in the knee. There is no hip pain, lower extremity weakness or paraesthesia. The right knee bothers him a little bit but  no where as bad as the left. He is ready to proceed with the left knee replacement. They have been treated conservatively in the past for the above stated problem and despite conservative measures, they continue to have progressive pain and severe functional limitations and dysfunction. They have failed non-operative management including home exercise, medications. It is felt that they would benefit from undergoing total joint replacement. Risks and benefits of the procedure have been discussed with the patient and they elect to proceed with surgery. There are no active contraindications to surgery such as ongoing infection or rapidly progressive neurological disease.  Allergies  Amoxicillin *PENICILLINS* swelling  Problem List/Past Medical  Primary osteoarthritis of left knee (M17.12) Sciatic nerve pain, right (M54.31) Hypertension  Family History  Osteoarthritis Father, Mother. mother Hypertension Father. father  Social History No history of drug/alcohol rehab Not under pain contract Number of flights of stairs before winded 4-5 Illicit drug use no Living situation live with spouse Marital status married Pain Contract no Tobacco / smoke exposure yes outdoors only Tobacco use Never smoker. never smoker; smoke(d) 3 or more pack(s) per day Alcohol use former drinker Drug/Alcohol Rehab (Previously) no Exercise Exercises daily; does other and gym / weights Former drinker 08/10/2013: In the past drank Children 1 Current work status retired Engineer, agricultural (Currently) no  Medication History  Multi-Day Plus Minerals (Oral) Active. Atenolol (  50MG  Tablet, Oral) Active. Aleve (220MG  Capsule, Oral) Active. Ibuprofen (200MG  Capsule, Oral) Active.  Past Surgical History  Rotator Cuff Repair bilateral Tonsillectomy  Review of Systems ( General Not Present- Chills, Fatigue, Fever, Memory Loss, Night Sweats, Weight Gain and Weight Loss. Skin Not Present-  Eczema, Hives, Itching, Lesions and Rash. HEENT Not Present- Dentures, Double Vision, Headache, Hearing Loss, Tinnitus and Visual Loss. Respiratory Not Present- Allergies, Chronic Cough, Coughing up blood, Shortness of breath at rest and Shortness of breath with exertion. Cardiovascular Not Present- Chest Pain, Difficulty Breathing Lying Down, Murmur, Palpitations, Racing/skipping heartbeats and Swelling. Gastrointestinal Not Present- Abdominal Pain, Bloody Stool, Constipation, Diarrhea, Difficulty Swallowing, Heartburn, Jaundice, Loss of appetitie, Nausea and Vomiting. Male Genitourinary Not Present- Blood in Urine, Discharge, Flank Pain, Incontinence, Painful Urination, Urgency, Urinary frequency, Urinary Retention, Urinating at Night and Weak urinary stream. Musculoskeletal Present- Back Pain and Joint Pain. Not Present- Joint Swelling, Morning Stiffness, Muscle Pain, Muscle Weakness and Spasms. Neurological Not Present- Blackout spells, Difficulty with balance, Dizziness, Paralysis, Tremor and Weakness. Psychiatric Not Present- Insomnia.  Vitals Weight: 117 lb Height: 65in Weight was reported by patient. Height was reported by patient. Body Surface Area: 1.56 m Body Mass Index: 19.47 kg/m  Pulse: 64 (Regular)  BP: 134/70 (Sitting, Left Arm, Standard)   Physical Exam General Mental Status -Alert, cooperative and good historian. General Appearance-pleasant, Not in acute distress. Orientation-Oriented X3. Build & Nutrition-Well nourished and Well developed.  Head and Neck Head-normocephalic, atraumatic . Neck Global Assessment - supple, no bruit auscultated on the right, no bruit auscultated on the left.  Eye Pupil - Bilateral-Regular and Round. Motion - Bilateral-EOMI.  Chest and Lung Exam Auscultation Breath sounds - clear at anterior chest wall and clear at posterior chest wall. Adventitious sounds - No Adventitious  sounds.  Cardiovascular Auscultation Rhythm - Regular rate and rhythm. Heart Sounds - S1 WNL and S2 WNL. Murmurs & Other Heart Sounds - Auscultation of the heart reveals - No Murmurs.  Abdomen Palpation/Percussion Tenderness - Abdomen is non-tender to palpation. Rigidity (guarding) - Abdomen is soft. Auscultation Auscultation of the abdomen reveals - Bowel sounds normal.  Male Genitourinary Note: Not done, not pertinent to present illness   Musculoskeletal Note: He is a very pleasant, well developed male. He is alert and oriented. No apparent distress. Evaluation of his left knee shows no effusion. He has a significant varus deformity. He has some hypertrophic degenerative changes about the knee. He has a varus deformity. Range is 5-125. Marked crepitus on range of motion. There is some slight tenderness, medial greater than lateral. There is no instability noted. The right knee shows no effusion, range is 0-135 with no swelling, tenderness or instability. Pulses, sensation and motor are intact in both lower extremities.  RADIOGRAPHS: AP both knees and lateral show the right knee is normal. The left knee shows severe bone on bone arthritis in the medial and patellofemoral compartments with significant lateral subluxation in the tibia and significant varus deformity.   Assessment & Plan  Primary osteoarthritis of left knee (M17.12) Note:Plan is for a Left Total Knee Replacement by Dr. Wynelle Link.  Plan is to go home with wife.  The patient does not have any contraindications and will receive TXA (tranexamic acid) prior to surgery.  Signed electronically by Joelene Millin, III PA-C

## 2013-12-14 ENCOUNTER — Encounter (HOSPITAL_COMMUNITY)
Admission: RE | Disposition: A | Discharge status: Home Health | Payer: Self-pay | Source: Ambulatory Visit | Attending: Orthopedic Surgery

## 2013-12-14 ENCOUNTER — Encounter (HOSPITAL_COMMUNITY): Payer: Self-pay | Admitting: *Deleted

## 2013-12-14 ENCOUNTER — Inpatient Hospital Stay (HOSPITAL_COMMUNITY): Payer: Medicare Other | Admitting: Anesthesiology

## 2013-12-14 ENCOUNTER — Inpatient Hospital Stay (HOSPITAL_COMMUNITY)
Admission: RE | Admit: 2013-12-14 | Discharge: 2013-12-16 | DRG: 470 | Disposition: A | Discharge status: Home Health | Payer: Medicare Other | Source: Ambulatory Visit | Attending: Orthopedic Surgery | Admitting: Orthopedic Surgery

## 2013-12-14 DIAGNOSIS — Z79899 Other long term (current) drug therapy: Secondary | ICD-10-CM

## 2013-12-14 DIAGNOSIS — M1712 Unilateral primary osteoarthritis, left knee: Secondary | ICD-10-CM | POA: Diagnosis not present

## 2013-12-14 DIAGNOSIS — Z8261 Family history of arthritis: Secondary | ICD-10-CM | POA: Diagnosis not present

## 2013-12-14 DIAGNOSIS — M21162 Varus deformity, not elsewhere classified, left knee: Secondary | ICD-10-CM | POA: Diagnosis present

## 2013-12-14 DIAGNOSIS — Z791 Long term (current) use of non-steroidal anti-inflammatories (NSAID): Secondary | ICD-10-CM

## 2013-12-14 DIAGNOSIS — Z8249 Family history of ischemic heart disease and other diseases of the circulatory system: Secondary | ICD-10-CM

## 2013-12-14 DIAGNOSIS — M25562 Pain in left knee: Secondary | ICD-10-CM | POA: Diagnosis not present

## 2013-12-14 DIAGNOSIS — Z880 Allergy status to penicillin: Secondary | ICD-10-CM

## 2013-12-14 DIAGNOSIS — I10 Essential (primary) hypertension: Secondary | ICD-10-CM | POA: Diagnosis present

## 2013-12-14 DIAGNOSIS — M179 Osteoarthritis of knee, unspecified: Secondary | ICD-10-CM | POA: Diagnosis present

## 2013-12-14 DIAGNOSIS — M1710 Unilateral primary osteoarthritis, unspecified knee: Secondary | ICD-10-CM | POA: Diagnosis present

## 2013-12-14 DIAGNOSIS — M25762 Osteophyte, left knee: Secondary | ICD-10-CM | POA: Diagnosis present

## 2013-12-14 HISTORY — PX: TOTAL KNEE ARTHROPLASTY: SHX125

## 2013-12-14 LAB — TYPE AND SCREEN
ABO/RH(D): O POS
ANTIBODY SCREEN: NEGATIVE

## 2013-12-14 LAB — ABO/RH: ABO/RH(D): O POS

## 2013-12-14 SURGERY — ARTHROPLASTY, KNEE, TOTAL
Anesthesia: Spinal | Site: Knee | Laterality: Left

## 2013-12-14 MED ORDER — BISACODYL 10 MG RE SUPP: 10.0000 mg | Freq: Every day | RECTAL | Status: DC | PRN

## 2013-12-14 MED ORDER — FENTANYL CITRATE 0.05 MG/ML IJ SOLN
INTRAMUSCULAR | Status: DC | PRN
  Administered 2013-12-14: 50 ug via INTRAVENOUS

## 2013-12-14 MED ORDER — FLEET ENEMA 7-19 GM/118ML RE ENEM: 1.0000 | ENEMA | Freq: Once | RECTAL | Status: AC | PRN

## 2013-12-14 MED ORDER — PROMETHAZINE HCL 25 MG/ML IJ SOLN: 6.2500 mg | INTRAMUSCULAR | Status: DC | PRN

## 2013-12-14 MED ORDER — 0.9 % SODIUM CHLORIDE (POUR BTL) OPTIME
TOPICAL | Status: DC | PRN
  Administered 2013-12-14: 1000 mL

## 2013-12-14 MED ORDER — BUPIVACAINE LIPOSOME 1.3 % IJ SUSP
INTRAMUSCULAR | Status: DC | PRN
  Administered 2013-12-14: 20 mL

## 2013-12-14 MED ORDER — DEXAMETHASONE SODIUM PHOSPHATE 10 MG/ML IJ SOLN
10.0000 mg | Freq: Once | INTRAMUSCULAR | Status: AC
Start: 2013-12-15 — End: 2013-12-15
  Administered 2013-12-15: 10 mg via INTRAVENOUS
  Filled 2013-12-14: qty 1

## 2013-12-14 MED ORDER — LIDOCAINE HCL (CARDIAC) 20 MG/ML IV SOLN
INTRAVENOUS | Status: DC | PRN
  Administered 2013-12-14: 70 mg via INTRAVENOUS

## 2013-12-14 MED ORDER — BUPIVACAINE HCL (PF) 0.25 % IJ SOLN
INTRAMUSCULAR | Status: AC
  Filled 2013-12-14: qty 30

## 2013-12-14 MED ORDER — LACTATED RINGERS IV SOLN
INTRAVENOUS | Status: DC
  Administered 2013-12-14: 1000 mL via INTRAVENOUS
  Administered 2013-12-14 (×2): via INTRAVENOUS

## 2013-12-14 MED ORDER — DEXAMETHASONE SODIUM PHOSPHATE 10 MG/ML IJ SOLN
INTRAMUSCULAR | Status: AC
  Filled 2013-12-14: qty 1

## 2013-12-14 MED ORDER — RIVAROXABAN 10 MG PO TABS
10.0000 mg | ORAL_TABLET | Freq: Every day | ORAL | Status: DC
  Administered 2013-12-15 – 2013-12-16 (×2): 10 mg via ORAL
  Filled 2013-12-14 (×3): qty 1

## 2013-12-14 MED ORDER — ONDANSETRON HCL 4 MG PO TABS: 4.0000 mg | ORAL_TABLET | Freq: Four times a day (QID) | ORAL | Status: DC | PRN

## 2013-12-14 MED ORDER — HYDROMORPHONE HCL 1 MG/ML IJ SOLN: 0.2500 mg | INTRAMUSCULAR | Status: DC | PRN

## 2013-12-14 MED ORDER — PROPOFOL 10 MG/ML IV BOLUS
INTRAVENOUS | Status: AC
  Filled 2013-12-14: qty 20

## 2013-12-14 MED ORDER — SODIUM CHLORIDE 0.9 % IV SOLN
INTRAVENOUS | Status: DC
  Administered 2013-12-14 – 2013-12-15 (×2): via INTRAVENOUS

## 2013-12-14 MED ORDER — METOCLOPRAMIDE HCL 10 MG PO TABS: 5.0000 mg | ORAL_TABLET | Freq: Three times a day (TID) | ORAL | Status: DC | PRN

## 2013-12-14 MED ORDER — ACETAMINOPHEN 10 MG/ML IV SOLN
1000.0000 mg | Freq: Once | INTRAVENOUS | Status: AC
  Administered 2013-12-14: 1000 mg via INTRAVENOUS
  Filled 2013-12-14: qty 100

## 2013-12-14 MED ORDER — OXYCODONE HCL 5 MG PO TABS
5.0000 mg | ORAL_TABLET | ORAL | Status: DC | PRN
  Administered 2013-12-14 (×2): 5 mg via ORAL
  Administered 2013-12-15 – 2013-12-16 (×12): 10 mg via ORAL
  Filled 2013-12-14 (×5): qty 2
  Filled 2013-12-14: qty 1
  Filled 2013-12-14 (×3): qty 2
  Filled 2013-12-14: qty 1
  Filled 2013-12-14 (×4): qty 2

## 2013-12-14 MED ORDER — SODIUM CHLORIDE 0.9 % IR SOLN
Status: DC | PRN
  Administered 2013-12-14: 1000 mL

## 2013-12-14 MED ORDER — POLYETHYLENE GLYCOL 3350 17 G PO PACK: 17.0000 g | PACK | Freq: Every day | ORAL | Status: DC | PRN

## 2013-12-14 MED ORDER — DIPHENHYDRAMINE HCL 12.5 MG/5ML PO ELIX: 12.5000 mg | ORAL_SOLUTION | ORAL | Status: DC | PRN

## 2013-12-14 MED ORDER — SODIUM CHLORIDE 0.9 % IJ SOLN
INTRAMUSCULAR | Status: AC
  Filled 2013-12-14: qty 50

## 2013-12-14 MED ORDER — ONDANSETRON HCL 4 MG/2ML IJ SOLN
4.0000 mg | Freq: Four times a day (QID) | INTRAMUSCULAR | Status: DC | PRN
  Administered 2013-12-15 (×2): 4 mg via INTRAVENOUS
  Filled 2013-12-14 (×2): qty 2

## 2013-12-14 MED ORDER — SODIUM CHLORIDE 0.9 % IJ SOLN
INTRAMUSCULAR | Status: DC | PRN
  Administered 2013-12-14: 30 mL via INTRAVENOUS

## 2013-12-14 MED ORDER — CEFAZOLIN SODIUM-DEXTROSE 2-3 GM-% IV SOLR
INTRAVENOUS | Status: AC
  Filled 2013-12-14: qty 50

## 2013-12-14 MED ORDER — FENTANYL CITRATE 0.05 MG/ML IJ SOLN
INTRAMUSCULAR | Status: AC
  Filled 2013-12-14: qty 2

## 2013-12-14 MED ORDER — CEFAZOLIN SODIUM-DEXTROSE 2-3 GM-% IV SOLR
2.0000 g | Freq: Four times a day (QID) | INTRAVENOUS | Status: AC
  Administered 2013-12-14 – 2013-12-15 (×2): 2 g via INTRAVENOUS
  Filled 2013-12-14 (×2): qty 50

## 2013-12-14 MED ORDER — ACETAMINOPHEN 325 MG PO TABS: 650.0000 mg | ORAL_TABLET | Freq: Four times a day (QID) | ORAL | Status: DC | PRN

## 2013-12-14 MED ORDER — LIDOCAINE HCL (CARDIAC) 20 MG/ML IV SOLN
INTRAVENOUS | Status: AC
Start: 2013-12-14 — End: 2013-12-14
  Filled 2013-12-14: qty 5

## 2013-12-14 MED ORDER — TRAMADOL HCL 50 MG PO TABS
50.0000 mg | ORAL_TABLET | Freq: Four times a day (QID) | ORAL | Status: DC | PRN
  Administered 2013-12-16: 100 mg via ORAL
  Filled 2013-12-14: qty 2

## 2013-12-14 MED ORDER — TRANEXAMIC ACID 100 MG/ML IV SOLN
1000.0000 mg | INTRAVENOUS | Status: AC
  Administered 2013-12-14: 1000 mg via INTRAVENOUS
  Filled 2013-12-14: qty 10

## 2013-12-14 MED ORDER — MIDAZOLAM HCL 5 MG/5ML IJ SOLN
INTRAMUSCULAR | Status: DC | PRN
  Administered 2013-12-14: 2 mg via INTRAVENOUS

## 2013-12-14 MED ORDER — BUPIVACAINE IN DEXTROSE 0.75-8.25 % IT SOLN
INTRATHECAL | Status: DC | PRN
  Administered 2013-12-14: 1.7 mL via INTRATHECAL

## 2013-12-14 MED ORDER — METHOCARBAMOL 1000 MG/10ML IJ SOLN
500.0000 mg | Freq: Four times a day (QID) | INTRAMUSCULAR | Status: DC | PRN
  Administered 2013-12-14 (×2): 500 mg via INTRAVENOUS
  Filled 2013-12-14 (×4): qty 5

## 2013-12-14 MED ORDER — BUPIVACAINE LIPOSOME 1.3 % IJ SUSP
20.0000 mL | Freq: Once | INTRAMUSCULAR | Status: DC
  Filled 2013-12-14: qty 20

## 2013-12-14 MED ORDER — ONDANSETRON HCL 4 MG/2ML IJ SOLN
INTRAMUSCULAR | Status: AC
  Filled 2013-12-14: qty 2

## 2013-12-14 MED ORDER — ACETAMINOPHEN 500 MG PO TABS
1000.0000 mg | ORAL_TABLET | Freq: Four times a day (QID) | ORAL | Status: AC
  Administered 2013-12-14 – 2013-12-15 (×4): 1000 mg via ORAL
  Filled 2013-12-14 (×6): qty 2

## 2013-12-14 MED ORDER — SODIUM CHLORIDE 0.9 % IV SOLN: INTRAVENOUS | Status: DC

## 2013-12-14 MED ORDER — BUPIVACAINE HCL 0.25 % IJ SOLN
INTRAMUSCULAR | Status: DC | PRN
  Administered 2013-12-14: 20 mL

## 2013-12-14 MED ORDER — CEFAZOLIN SODIUM-DEXTROSE 2-3 GM-% IV SOLR
2.0000 g | INTRAVENOUS | Status: AC
  Administered 2013-12-14: 2 g via INTRAVENOUS

## 2013-12-14 MED ORDER — KETOROLAC TROMETHAMINE 15 MG/ML IJ SOLN
7.5000 mg | Freq: Four times a day (QID) | INTRAMUSCULAR | Status: AC | PRN
  Administered 2013-12-14: 7.5 mg via INTRAVENOUS
  Filled 2013-12-14: qty 1

## 2013-12-14 MED ORDER — MIDAZOLAM HCL 2 MG/2ML IJ SOLN
INTRAMUSCULAR | Status: AC
  Filled 2013-12-14: qty 2

## 2013-12-14 MED ORDER — DOCUSATE SODIUM 100 MG PO CAPS
100.0000 mg | ORAL_CAPSULE | Freq: Two times a day (BID) | ORAL | Status: DC
  Administered 2013-12-14 – 2013-12-16 (×4): 100 mg via ORAL

## 2013-12-14 MED ORDER — ONDANSETRON HCL 4 MG/2ML IJ SOLN
INTRAMUSCULAR | Status: DC | PRN
  Administered 2013-12-14: 4 mg via INTRAVENOUS

## 2013-12-14 MED ORDER — MENTHOL 3 MG MT LOZG
1.0000 | LOZENGE | OROMUCOSAL | Status: DC | PRN
  Filled 2013-12-14: qty 9

## 2013-12-14 MED ORDER — PROPOFOL INFUSION 10 MG/ML OPTIME
INTRAVENOUS | Status: DC | PRN
  Administered 2013-12-14: 75 ug/kg/min via INTRAVENOUS

## 2013-12-14 MED ORDER — CHLORHEXIDINE GLUCONATE 4 % EX LIQD: 60.0000 mL | Freq: Once | CUTANEOUS | Status: DC

## 2013-12-14 MED ORDER — PHENOL 1.4 % MT LIQD
1.0000 | OROMUCOSAL | Status: DC | PRN
  Filled 2013-12-14: qty 177

## 2013-12-14 MED ORDER — ACETAMINOPHEN 650 MG RE SUPP
650.0000 mg | Freq: Four times a day (QID) | RECTAL | Status: DC | PRN
Start: 2013-12-15 — End: 2013-12-16

## 2013-12-14 MED ORDER — METOCLOPRAMIDE HCL 5 MG/ML IJ SOLN: 5.0000 mg | Freq: Three times a day (TID) | INTRAMUSCULAR | Status: DC | PRN

## 2013-12-14 MED ORDER — METHOCARBAMOL 500 MG PO TABS
500.0000 mg | ORAL_TABLET | Freq: Four times a day (QID) | ORAL | Status: DC | PRN
  Administered 2013-12-15 – 2013-12-16 (×5): 500 mg via ORAL
  Filled 2013-12-14 (×5): qty 1

## 2013-12-14 MED ORDER — ATENOLOL 50 MG PO TABS
50.0000 mg | ORAL_TABLET | Freq: Every day | ORAL | Status: DC
  Administered 2013-12-15: 50 mg via ORAL
  Filled 2013-12-14 (×2): qty 1

## 2013-12-14 MED ORDER — DEXAMETHASONE SODIUM PHOSPHATE 10 MG/ML IJ SOLN
10.0000 mg | Freq: Once | INTRAMUSCULAR | Status: AC
  Administered 2013-12-14: 10 mg via INTRAVENOUS

## 2013-12-14 SURGICAL SUPPLY — 62 items
BAG SPEC THK2 15X12 ZIP CLS (MISCELLANEOUS) ×1
BAG ZIPLOCK 12X15 (MISCELLANEOUS) ×3 IMPLANT
BANDAGE ELASTIC 6 VELCRO ST LF (GAUZE/BANDAGES/DRESSINGS) ×3 IMPLANT
BANDAGE ESMARK 6X9 LF (GAUZE/BANDAGES/DRESSINGS) ×1 IMPLANT
BLADE SAG 18X100X1.27 (BLADE) ×3 IMPLANT
BLADE SAW SGTL 11.0X1.19X90.0M (BLADE) ×3 IMPLANT
BNDG CMPR 9X6 STRL LF SNTH (GAUZE/BANDAGES/DRESSINGS) ×1
BNDG ESMARK 6X9 LF (GAUZE/BANDAGES/DRESSINGS) ×3
BOWL SMART MIX CTS (DISPOSABLE) ×3 IMPLANT
CAPT RP KNEE ×2 IMPLANT
CEMENT HV SMART SET (Cement) ×6 IMPLANT
CLOSURE WOUND 1/2 X4 (GAUZE/BANDAGES/DRESSINGS) ×1
CUFF TOURN SGL QUICK 34 (TOURNIQUET CUFF) ×3
CUFF TRNQT CYL 34X4X40X1 (TOURNIQUET CUFF) ×1 IMPLANT
DECANTER SPIKE VIAL GLASS SM (MISCELLANEOUS) ×3 IMPLANT
DRAPE EXTREMITY TIBURON (DRAPES) ×3 IMPLANT
DRAPE POUCH INSTRU U-SHP 10X18 (DRAPES) ×3 IMPLANT
DRAPE U-SHAPE 47X51 STRL (DRAPES) ×3 IMPLANT
DRSG ADAPTIC 3X8 NADH LF (GAUZE/BANDAGES/DRESSINGS) ×3 IMPLANT
DRSG PAD ABDOMINAL 8X10 ST (GAUZE/BANDAGES/DRESSINGS) ×3 IMPLANT
DURAPREP 26ML APPLICATOR (WOUND CARE) ×3 IMPLANT
ELECT REM PT RETURN 9FT ADLT (ELECTROSURGICAL) ×3
ELECTRODE REM PT RTRN 9FT ADLT (ELECTROSURGICAL) ×1 IMPLANT
EVACUATOR 1/8 PVC DRAIN (DRAIN) ×3 IMPLANT
FACESHIELD WRAPAROUND (MASK) ×15 IMPLANT
FACESHIELD WRAPAROUND OR TEAM (MASK) ×5 IMPLANT
GAUZE SPONGE 4X4 12PLY STRL (GAUZE/BANDAGES/DRESSINGS) ×3 IMPLANT
GLOVE BIO SURGEON STRL SZ7.5 (GLOVE) IMPLANT
GLOVE BIO SURGEON STRL SZ8 (GLOVE) ×3 IMPLANT
GLOVE BIOGEL PI IND STRL 6.5 (GLOVE) IMPLANT
GLOVE BIOGEL PI IND STRL 8 (GLOVE) ×1 IMPLANT
GLOVE BIOGEL PI INDICATOR 6.5 (GLOVE)
GLOVE BIOGEL PI INDICATOR 8 (GLOVE) ×2
GLOVE SURG SS PI 6.5 STRL IVOR (GLOVE) IMPLANT
GOWN STRL REUS W/TWL LRG LVL3 (GOWN DISPOSABLE) ×3 IMPLANT
GOWN STRL REUS W/TWL XL LVL3 (GOWN DISPOSABLE) IMPLANT
HANDPIECE INTERPULSE COAX TIP (DISPOSABLE) ×3
IMMOBILIZER KNEE 20 (SOFTGOODS) ×5 IMPLANT
IMMOBILIZER KNEE 20 THIGH 36 (SOFTGOODS) ×1 IMPLANT
KIT BASIN OR (CUSTOM PROCEDURE TRAY) ×3 IMPLANT
MANIFOLD NEPTUNE II (INSTRUMENTS) ×3 IMPLANT
NDL SAFETY ECLIPSE 18X1.5 (NEEDLE) ×2 IMPLANT
NEEDLE HYPO 18GX1.5 SHARP (NEEDLE) ×6
NS IRRIG 1000ML POUR BTL (IV SOLUTION) ×3 IMPLANT
PACK TOTAL JOINT (CUSTOM PROCEDURE TRAY) ×3 IMPLANT
PAD ABD 8X10 STRL (GAUZE/BANDAGES/DRESSINGS) ×2 IMPLANT
PADDING CAST COTTON 6X4 STRL (CAST SUPPLIES) ×4 IMPLANT
POSITIONER SURGICAL ARM (MISCELLANEOUS) ×3 IMPLANT
SET HNDPC FAN SPRY TIP SCT (DISPOSABLE) ×1 IMPLANT
STRIP CLOSURE SKIN 1/2X4 (GAUZE/BANDAGES/DRESSINGS) ×3 IMPLANT
SUCTION FRAZIER 12FR DISP (SUCTIONS) ×3 IMPLANT
SUT MNCRL AB 4-0 PS2 18 (SUTURE) ×3 IMPLANT
SUT VIC AB 2-0 CT1 27 (SUTURE) ×9
SUT VIC AB 2-0 CT1 TAPERPNT 27 (SUTURE) ×3 IMPLANT
SUT VLOC 180 0 24IN GS25 (SUTURE) ×3 IMPLANT
SYR 20CC LL (SYRINGE) ×3 IMPLANT
SYR 50ML LL SCALE MARK (SYRINGE) ×3 IMPLANT
TOWEL OR 17X26 10 PK STRL BLUE (TOWEL DISPOSABLE) ×3 IMPLANT
TOWEL OR NON WOVEN STRL DISP B (DISPOSABLE) IMPLANT
TRAY FOLEY BAG SILVER LF 16FR (CATHETERS) ×2 IMPLANT
WATER STERILE IRR 1500ML POUR (IV SOLUTION) ×3 IMPLANT
WRAP KNEE MAXI GEL POST OP (GAUZE/BANDAGES/DRESSINGS) ×3 IMPLANT

## 2013-12-14 NOTE — Interval H&P Note (Signed)
History and Physical Interval Note:  12/14/2013 3:09 PM  Ricky [name]  has presented today for surgery, with the diagnosis of LEFT KNEE OA   The various methods of treatment have been discussed with the patient and family. After consideration of risks, benefits and other options for treatment, the patient has consented to  Procedure(s): LEFT TOTAL KNEE ARTHROPLASTY (Left) as a surgical intervention .  The patient's history has been reviewed, patient examined, no change in status, stable for surgery.  I have reviewed the patient's chart and labs.  Questions were answered to the patient's satisfaction.     Gearlean Alf

## 2013-12-14 NOTE — Transfer of Care (Signed)
Immediate Anesthesia Transfer of Care Note  Patient: Ricky [name]  Procedure(s) Performed: Procedure(s): LEFT TOTAL KNEE ARTHROPLASTY (Left)  Patient Location: PACU  Anesthesia Type:Spinal  Level of Consciousness: awake, alert , oriented and patient cooperative  Airway & Oxygen Therapy: Patient Spontanous Breathing and Patient connected to face mask oxygen  Post-op Assessment: Report given to PACU RN and Post -op Vital signs reviewed and stable  Post vital signs: Reviewed and stable  Complications: No apparent anesthesia complications

## 2013-12-14 NOTE — H&P (View-Only) (Signed)
Ricky [name]. Ricky [name] DOB: [birth year] Married / Language: English / Race: White Male Date of Admission:  12-14-2013 Chief Complaint:  Left Knee Pain History of Present Illness  The patient is a 68 year old male who comes in  for a preoperative history and physical. The patient is scheduled for a left total knee arthroplasty to be performed by Dr. Dione Plover. Aluisio, MD at [facility] on 12/14/2013. The patient reports left knee symptoms including: pain and swelling which began 6 year(s) ago without any known injury. The patient describes the severity of the symptoms as moderate in severity.The patient feels that the symptoms are worsening. He reports that he did have a football injury on the left knee when he was in high school in which he had a valgus stress placed on the knee. He has had increasing problems with the knee over the past few years. He has modified his activity to the point where he cannot even walk long distances. He does continue to ride the bike and do some weight training. He has never had any injections in the knee. No groin pain. No pain in the right knee. Does have some issues with sciatica but more on the right. He has recently started Mobic which is not providing him with much relief. He is at a point where he is tired of the pain and tired of having to give up things that he likes to do. He is having a significant dysfunction from his left knee. The only thing he can basically do now is ride a bicycle. He states that makes the knee feel better. All other activities make the knee feel worse. This limits what he can and can not do. He has not had injections in the past. He is not really interested in doing them. He said his function is so compromised now that he needs to do something to get his function back. He does have a lot of pain with this also. The pain is in the knee. There is no hip pain, lower extremity weakness or paraesthesia. The right knee bothers him a little bit but  no where as bad as the left. He is ready to proceed with the left knee replacement. They have been treated conservatively in the past for the above stated problem and despite conservative measures, they continue to have progressive pain and severe functional limitations and dysfunction. They have failed non-operative management including home exercise, medications. It is felt that they would benefit from undergoing total joint replacement. Risks and benefits of the procedure have been discussed with the patient and they elect to proceed with surgery. There are no active contraindications to surgery such as ongoing infection or rapidly progressive neurological disease.  Allergies  Amoxicillin *PENICILLINS* swelling  Problem List/Past Medical  Primary osteoarthritis of left knee (M17.12) Sciatic nerve pain, right (M54.31) Hypertension  Family History  Osteoarthritis Father, Mother. mother Hypertension Father. father  Social History No history of drug/alcohol rehab Not under pain contract Number of flights of stairs before winded 4-5 Illicit drug use no Living situation live with spouse Marital status married Pain Contract no Tobacco / smoke exposure yes outdoors only Tobacco use Never smoker. never smoker; smoke(d) 3 or more pack(s) per day Alcohol use former drinker Drug/Alcohol Rehab (Previously) no Exercise Exercises daily; does other and gym / weights Former drinker 08/10/2013: In the past drank Children 1 Current work status retired Engineer, agricultural (Currently) no  Medication History  Multi-Day Plus Minerals (Oral) Active. Atenolol (  50MG  Tablet, Oral) Active. Aleve (220MG  Capsule, Oral) Active. Ibuprofen (200MG  Capsule, Oral) Active.  Past Surgical History  Rotator Cuff Repair bilateral Tonsillectomy  Review of Systems ( General Not Present- Chills, Fatigue, Fever, Memory Loss, Night Sweats, Weight Gain and Weight Loss. Skin Not Present-  Eczema, Hives, Itching, Lesions and Rash. HEENT Not Present- Dentures, Double Vision, Headache, Hearing Loss, Tinnitus and Visual Loss. Respiratory Not Present- Allergies, Chronic Cough, Coughing up blood, Shortness of breath at rest and Shortness of breath with exertion. Cardiovascular Not Present- Chest Pain, Difficulty Breathing Lying Down, Murmur, Palpitations, Racing/skipping heartbeats and Swelling. Gastrointestinal Not Present- Abdominal Pain, Bloody Stool, Constipation, Diarrhea, Difficulty Swallowing, Heartburn, Jaundice, Loss of appetitie, Nausea and Vomiting. Male Genitourinary Not Present- Blood in Urine, Discharge, Flank Pain, Incontinence, Painful Urination, Urgency, Urinary frequency, Urinary Retention, Urinating at Night and Weak urinary stream. Musculoskeletal Present- Back Pain and Joint Pain. Not Present- Joint Swelling, Morning Stiffness, Muscle Pain, Muscle Weakness and Spasms. Neurological Not Present- Blackout spells, Difficulty with balance, Dizziness, Paralysis, Tremor and Weakness. Psychiatric Not Present- Insomnia.  Vitals Weight: 117 lb Height: 65in Weight was reported by patient. Height was reported by patient. Body Surface Area: 1.56 m Body Mass Index: 19.47 kg/m  Pulse: 64 (Regular)  BP: 134/70 (Sitting, Left Arm, Standard)   Physical Exam General Mental Status -Alert, cooperative and good historian. General Appearance-pleasant, Not in acute distress. Orientation-Oriented X3. Build & Nutrition-Well nourished and Well developed.  Head and Neck Head-normocephalic, atraumatic . Neck Global Assessment - supple, no bruit auscultated on the right, no bruit auscultated on the left.  Eye Pupil - Bilateral-Regular and Round. Motion - Bilateral-EOMI.  Chest and Lung Exam Auscultation Breath sounds - clear at anterior chest wall and clear at posterior chest wall. Adventitious sounds - No Adventitious  sounds.  Cardiovascular Auscultation Rhythm - Regular rate and rhythm. Heart Sounds - S1 WNL and S2 WNL. Murmurs & Other Heart Sounds - Auscultation of the heart reveals - No Murmurs.  Abdomen Palpation/Percussion Tenderness - Abdomen is non-tender to palpation. Rigidity (guarding) - Abdomen is soft. Auscultation Auscultation of the abdomen reveals - Bowel sounds normal.  Male Genitourinary Note: Not done, not pertinent to present illness   Musculoskeletal Note: He is a very pleasant, well developed male. He is alert and oriented. No apparent distress. Evaluation of his left knee shows no effusion. He has a significant varus deformity. He has some hypertrophic degenerative changes about the knee. He has a varus deformity. Range is 5-125. Marked crepitus on range of motion. There is some slight tenderness, medial greater than lateral. There is no instability noted. The right knee shows no effusion, range is 0-135 with no swelling, tenderness or instability. Pulses, sensation and motor are intact in both lower extremities.  RADIOGRAPHS: AP both knees and lateral show the right knee is normal. The left knee shows severe bone on bone arthritis in the medial and patellofemoral compartments with significant lateral subluxation in the tibia and significant varus deformity.   Assessment & Plan  Primary osteoarthritis of left knee (M17.12) Note:Plan is for a Left Total Knee Replacement by Dr. Wynelle Link.  Plan is to go home with wife.  The patient does not have any contraindications and will receive TXA (tranexamic acid) prior to surgery.  Signed electronically by Joelene Millin, III PA-C

## 2013-12-14 NOTE — Anesthesia Postprocedure Evaluation (Signed)
  Anesthesia Post-op Note  Patient: Ricky [name]  Procedure(s) Performed: Procedure(s) (LRB): LEFT TOTAL KNEE ARTHROPLASTY (Left)  Patient Location: PACU  Anesthesia Type: Spinal  Level of Consciousness: awake and alert   Airway and Oxygen Therapy: Patient Spontanous Breathing  Post-op Pain: mild  Post-op Assessment: Post-op Vital signs reviewed, Patient's Cardiovascular Status Stable, Respiratory Function Stable, Patent Airway and No signs of Nausea or vomiting  Last Vitals:  Filed Vitals:   12/14/13 1815  BP: 113/58  Pulse: 41  Temp:   Resp: 13    Post-op Vital Signs: stable   Complications: No apparent anesthesia complications

## 2013-12-14 NOTE — Anesthesia Preprocedure Evaluation (Addendum)
Anesthesia Evaluation  Patient identified by MRN, date of birth, ID band Patient awake    Reviewed: Allergy & Precautions, H&P , NPO status , Patient's Chart, lab work & pertinent test results  Airway Mallampati: II  TM Distance: >3 FB Neck ROM: Full    Dental no notable dental hx.    Pulmonary neg pulmonary ROS,  breath sounds clear to auscultation  Pulmonary exam normal       Cardiovascular hypertension, Pt. on medications and Pt. on home beta blockers Rhythm:Regular Rate:Normal     Neuro/Psych negative neurological ROS  negative psych ROS   GI/Hepatic negative GI ROS, Neg liver ROS,   Endo/Other  negative endocrine ROS  Renal/GU negative Renal ROS  negative genitourinary   Musculoskeletal  (+) Arthritis -,   Abdominal   Peds negative pediatric ROS (+)  Hematology negative hematology ROS (+)   Anesthesia Other Findings   Reproductive/Obstetrics negative OB ROS                           Anesthesia Physical Anesthesia Plan  ASA: II  Anesthesia Plan: Spinal   Post-op Pain Management:    Induction: Intravenous  Airway Management Planned:   Additional Equipment:   Intra-op Plan:   Post-operative Plan: Extubation in OR  Informed Consent: I have reviewed the patients History and Physical, chart, labs and discussed the procedure including the risks, benefits and alternatives for the proposed anesthesia with the patient or authorized representative who has indicated his/her understanding and acceptance.   Dental advisory given  Plan Discussed with: CRNA  Anesthesia Plan Comments: (Discussed spinal and general. Discussed risks/benefits of spinal including headache, backache, failure, bleeding, infection, and nerve damage. Patient consents to spinal. Questions answered. Coagulation studies and platelet count acceptable.)       Anesthesia Quick Evaluation

## 2013-12-14 NOTE — Op Note (Signed)
Pre-operative diagnosis- Osteoarthritis  Left knee(s)  Post-operative diagnosis- Osteoarthritis Left knee(s)  Procedure-  Left  Total Knee Arthroplasty  Surgeon- Ricky [name]. Ricky Dascenzo, MD  Assistant- Ricky Muslim, PA-C   Anesthesia-  Spinal  EBL-* No blood loss amount entered *   Drains Hemovac  Tourniquet time-  37 minutes @ 809 mm Hg  Complications- None  Condition-PACU - hemodynamically stable.   Brief Clinical Note   Ricky [name] is a 68 y.o. year old male with end stage OA of his left knee with progressively worsening pain and dysfunction. He has constant pain, with activity and at rest and significant functional deficits with difficulties even with ADLs. He has had extensive non-op management including analgesics, injections of cortisone and viscosupplements, and home exercise program, but remains in significant pain with significant dysfunction. Radiographs show bone on bone arthritis all 3 compartments with varus deformity and tibial subluxation. He presents now for left Total Knee Arthroplasty.     Procedure in detail---   The patient is brought into the operating room and positioned supine on the operating table. After successful administration of  Spinal,   a tourniquet is placed high on the  Left thigh(s) and the lower extremity is prepped and draped in the usual sterile fashion. Time out is performed by the operating team and then the  Left lower extremity is wrapped in Esmarch, knee flexed and the tourniquet inflated to 300 mmHg.       A midline incision is made with a ten blade through the subcutaneous tissue to the level of the extensor mechanism. A fresh blade is used to make a medial parapatellar arthrotomy. Soft tissue over the proximal medial tibia is subperiosteally elevated to the joint line with a knife and into the semimembranosus bursa with a Cobb elevator. Soft tissue over the proximal lateral tibia is elevated with attention being paid to avoiding the patellar  tendon on the tibial tubercle. The patella is everted, knee flexed 90 degrees and the ACL and PCL are removed. Findings are bone on bone all 3 compartments with massive global osteophytes.        The drill is used to create a starting hole in the distal femur and the canal is thoroughly irrigated with sterile saline to remove the fatty contents. The 5 degree Left  valgus alignment guide is placed into the femoral canal and the distal femoral cutting block is pinned to remove 10 mm off the distal femur. Resection is made with an oscillating saw.      The tibia is subluxed forward and the menisci are removed. The extramedullary alignment guide is placed referencing proximally at the medial aspect of the tibial tubercle and distally along the second metatarsal axis and tibial crest. The block is pinned to remove 70mm off the more deficient medial  side. Resection is made with an oscillating saw. Size 4is the most appropriate size for the tibia and the proximal tibia is prepared with the modular drill and keel punch for that size.      The femoral sizing guide is placed and size 5 is most appropriate. Rotation is marked off the epicondylar axis and confirmed by creating a rectangular flexion gap at 90 degrees. The size 5 cutting block is pinned in this rotation and the anterior, posterior and chamfer cuts are made with the oscillating saw. The intercondylar block is then placed and that cut is made.      Trial size 4 tibial component, trial size 5 posterior stabilized  femur and a 12.5  mm posterior stabilized rotating platform insert trial is placed. Full extension is achieved with excellent varus/valgus and anterior/posterior balance throughout full range of motion. The patella is everted and thickness measured to be 24  mm. Free hand resection is taken to 14 mm, a 38 template is placed, lug holes are drilled, trial patella is placed, and it tracks normally. Osteophytes are removed off the posterior femur with the  trial in place. All trials are removed and the cut bone surfaces prepared with pulsatile lavage. Cement is mixed and once ready for implantation, the size 4 tibial implant, size  5 posterior stabilized femoral component, and the size 38 patella are cemented in place and the patella is held with the clamp. The trial insert is placed and the knee held in full extension. The Exparel (20 ml mixed with 30 ml saline) and .25% Bupivicaine, are injected into the extensor mechanism, posterior capsule, medial and lateral gutters and subcutaneous tissues.  All extruded cement is removed and once the cement is hard the permanent 12.5 mm posterior stabilized rotating platform insert is placed into the tibial tray.      The wound is copiously irrigated with saline solution and the extensor mechanism closed over a hemovac drain with #1 V-loc suture. The tourniquet is released for a total tourniquet time of 37  minutes. Flexion against gravity is 140 degrees and the patella tracks normally. Subcutaneous tissue is closed with 2.0 vicryl and subcuticular with running 4.0 Monocryl. The incision is cleaned and dried and steri-strips and a bulky sterile dressing are applied. The limb is placed into a knee immobilizer and the patient is awakened and transported to recovery in stable condition.      Please note that a surgical assistant was a medical necessity for this procedure in order to perform it in a safe and expeditious manner. Surgical assistant was necessary to retract the ligaments and vital neurovascular structures to prevent injury to them and also necessary for proper positioning of the limb to allow for anatomic placement of the prosthesis.   Ricky [name] Ricky Scrima, MD    12/14/2013, 4:32 PM

## 2013-12-14 NOTE — Progress Notes (Signed)
Utilization review completed.  

## 2013-12-14 NOTE — Anesthesia Procedure Notes (Signed)
Spinal  Start time: 12/14/2013 3:18 PM End time: 12/14/2013 3:21 PM Staffing Resident/CRNA: Carleene Cooper A Performed by: resident/CRNA  Preanesthetic Checklist Completed: patient identified, site marked, surgical consent, pre-op evaluation, timeout performed, IV checked, risks and benefits discussed and monitors and equipment checked Spinal Block Patient position: sitting Prep: Betadine and ChloraPrep Patient monitoring: cardiac monitor, continuous pulse ox and blood pressure Approach: midline Location: L2-3 Injection technique: single-shot Needle Needle type: Quincke  Needle gauge: 22 G Needle length: 9 cm Assessment Sensory level: T4 Additional Notes Pt tolerated procedure well. + CSF, - blood.  Expiration date of spinal kit- 2017-04

## 2013-12-15 ENCOUNTER — Encounter (HOSPITAL_COMMUNITY): Payer: Self-pay | Admitting: Orthopedic Surgery

## 2013-12-15 LAB — BASIC METABOLIC PANEL
ANION GAP: 12 (ref 5–15)
BUN: 20 mg/dL (ref 6–23)
CO2: 22 mEq/L (ref 19–32)
Calcium: 8.5 mg/dL (ref 8.4–10.5)
Chloride: 105 mEq/L (ref 96–112)
Creatinine, Ser: 0.78 mg/dL (ref 0.50–1.35)
GFR calc non Af Amer: 89 mL/min — ABNORMAL LOW (ref >90)
Glucose, Bld: 138 mg/dL — ABNORMAL HIGH (ref 70–99)
Potassium: 4.6 mEq/L (ref 3.7–5.3)
Sodium: 139 mEq/L (ref 137–147)

## 2013-12-15 LAB — CBC
HCT: 33 % — ABNORMAL LOW (ref 39.0–52.0)
Hemoglobin: 11.3 g/dL — ABNORMAL LOW (ref 13.0–17.0)
MCH: 32.3 pg (ref 26.0–34.0)
MCHC: 34.2 g/dL (ref 30.0–36.0)
MCV: 94.3 fL (ref 78.0–100.0)
PLATELETS: 240 10*3/uL (ref 150–400)
RBC: 3.5 MIL/uL — ABNORMAL LOW (ref 4.22–5.81)
RDW: 12.9 % (ref 11.5–15.5)
WBC: 8.7 10*3/uL (ref 4.0–10.5)

## 2013-12-15 MED ORDER — OXYCODONE HCL 5 MG PO TABS: 5.0000 mg | ORAL_TABLET | ORAL | Status: DC | PRN

## 2013-12-15 MED ORDER — METHOCARBAMOL 500 MG PO TABS: 500.0000 mg | ORAL_TABLET | Freq: Four times a day (QID) | ORAL | Status: DC | PRN

## 2013-12-15 MED ORDER — TRAMADOL HCL 50 MG PO TABS: 50.0000 mg | ORAL_TABLET | Freq: Four times a day (QID) | ORAL | Status: DC | PRN

## 2013-12-15 MED ORDER — RIVAROXABAN 10 MG PO TABS: 10.0000 mg | ORAL_TABLET | Freq: Every day | ORAL | Status: DC

## 2013-12-15 MED ORDER — SODIUM CHLORIDE 0.9 % IV BOLUS (SEPSIS)
250.0000 mL | Freq: Once | INTRAVENOUS | Status: AC
  Administered 2013-12-15: 250 mL via INTRAVENOUS

## 2013-12-15 MED ORDER — HYDROMORPHONE HCL 1 MG/ML IJ SOLN
0.5000 mg | INTRAMUSCULAR | Status: DC | PRN
  Administered 2013-12-15: 0.5 mg via INTRAVENOUS
  Filled 2013-12-15 (×2): qty 1

## 2013-12-15 NOTE — Plan of Care (Signed)
Problem: Phase II Progression Outcomes Goal: Ambulates Outcome: Progressing Goal: Tolerating diet Outcome: Progressing Goal: Discharge plan established Outcome: Progressing

## 2013-12-15 NOTE — Plan of Care (Signed)
Problem: Phase I Progression Outcomes Goal: CMS/Neurovascular status WDL Outcome: Completed/Met Date Met:  12/15/13

## 2013-12-15 NOTE — Plan of Care (Signed)
Problem: Phase I Progression Outcomes Goal: Hemodynamically stable Outcome: Completed/Met Date Met:  12/15/13

## 2013-12-15 NOTE — Care Management Note (Signed)
    Page 1 of 2   12/15/2013     12:12:01 PM CARE MANAGEMENT NOTE 12/15/2013  Patient:  Ricky [name],Ricky [name]   Account Number:  [id number]  Date Initiated:  12/15/2013  Documentation initiated by:  [facility]  Subjective/Objective Assessment:   adm: LEFT TOTAL KNEE ARTHROPLASTY (Left)     Action/Plan:   discharge planning   Anticipated DC Date:  12/16/2013   Anticipated DC Plan:  [place]  CM consult      [facility] Choice  HOME HEALTH   Choice offered to / List presented to:  C-1 Patient   DME arranged  3-N-1  Vassie Moselle      DME agency  [place] arranged  [place]   Status of service:  Completed, signed off Medicare Important Message given?   (If response is "NO", the following Medicare IM given date fields will be blank) Date Medicare IM given:   Medicare IM given by:   Date Additional Medicare IM given:   Additional Medicare IM given by:    Discharge Disposition:  [place]  Per UR Regulation:    If discussed at Long Length of Stay Meetings, dates discussed:    Comments:  12/15/13 Cm met with pt in room to offer choice of home health agency.  Pt chooses [facility] to render HHPT.  CM called [facility] DME rep, Lecretia to please deliver 3n1 and Rolling walker to room prior to discharge.  Address and contact information verified with pt.  Referral called to [facility].  No other CM needs were communicated. Mariane Masters, BSN, CM [phone].

## 2013-12-15 NOTE — Plan of Care (Signed)
Problem: Phase I Progression Outcomes Goal: Pain controlled with appropriate interventions Outcome: Progressing     

## 2013-12-15 NOTE — Progress Notes (Signed)
Physical Therapy Treatment Patient Details Name: Ricky [name] MRN: [id number] DOB: [birth year] Today's Date: 12/15/2013    History of Present Illness 68 yo male s/p L TKA.     PT Comments    Progressing well with mobility. Will need more practice with stair negotiation on tomorrow. Plan is for possible d/c home tomorrow.   Follow Up Recommendations  Home health PT     Equipment Recommendations  Rolling walker with 5" wheels    Recommendations for Other Services OT consult     Precautions / Restrictions Precautions Precautions: Knee;Fall Required Braces or Orthoses: Knee Immobilizer - Left Knee Immobilizer - Left: Discontinue once straight leg raise with < 10 degree lag Restrictions Weight Bearing Restrictions: No LLE Weight Bearing: Weight bearing as tolerated    Mobility  Bed Mobility Overal bed mobility: Needs Assistance Bed Mobility: Sit to Supine     Supine to sit: Min guard Sit to supine: Min guard      Transfers Overall transfer level: Needs assistance Equipment used: Rolling walker (2 wheeled) Transfers: Sit to/from Stand Sit to Stand: Min guard         General transfer comment: verbal cues for hand placement and LE management.  Ambulation/Gait Ambulation/Gait assistance: Min guard Ambulation Distance (Feet): 160 Feet Assistive device: Rolling walker (2 wheeled) Gait Pattern/deviations: Step-to pattern;Decreased stride length;Antalgic     General Gait Details: VCs safety, technique, sequence.    Stairs     Stair Management: Step to pattern;Forwards;Backwards;With walker;With crutches;One rail Left Number of Stairs: 2 (x2) General stair comments: Practiced x 1: backwards with RW; x1: forwards with 1 crutch, 1 rail. Will practice steps again on tomorrow to see if pt is able to use crutch for entry steps as well to decrease level of confusion.  Wheelchair Mobility    Modified Rankin (Stroke Patients Only)       Balance                                     Cognition Arousal/Alertness: Awake/alert Behavior During Therapy: WFL for tasks assessed/performed Overall Cognitive Status: Within Functional Limits for tasks assessed                      Exercises Total Joint Exercises Ankle Circles/Pumps: AROM;10 reps;Both;Supine Quad Sets: AROM;Both;10 reps;Supine Heel Slides: AAROM;Left;10 reps;Supine Hip ABduction/ADduction: AROM;Left;10 reps;Supine Straight Leg Raises: AROM;AAROM;Left;10 reps;Supine Goniometric ROM: 10-45 degrees    General Comments        Pertinent Vitals/Pain Pain Assessment: 0-10 Pain Score: 6  Pain Location: L knee Pain Intervention(s): Monitored during session;Ice applied    Home Living                      Prior Function            PT Goals (current goals can now be found in the care plan section) Acute Rehab PT Goals Patient Stated Goal: home soon.  PT Goal Formulation: With patient Time For Goal Achievement: 12/22/13 Potential to Achieve Goals: Good Progress towards PT goals: Progressing toward goals    Frequency  7X/week    PT Plan Current plan remains appropriate    Co-evaluation             End of Session Equipment Utilized During Treatment: Gait belt;Left knee immobilizer Activity Tolerance: Patient tolerated treatment well Patient left: in bed;with call bell/phone within reach;with family/visitor present  Time: 9678-9381 PT Time Calculation (min) (ACUTE ONLY): 24 min  Charges:  $Gait Training: 23-37 mins $Therapeutic Exercise: 8-22 mins                    G Codes:      Weston Anna, MPT Pager: [phone]

## 2013-12-15 NOTE — Discharge Summary (Signed)
Physician Discharge Summary   Patient ID: Ricky [name] MRN: [id number] DOB/AGE: 1945/04/29 68 y.o.  Admit date: 12/14/2013 Discharge date: 12/16/2013  Primary Diagnosis:  Osteoarthritis Left knee(s) Admission Diagnoses:  Past Medical History  Diagnosis Date  . Hypertension   . Arthritis    Discharge Diagnoses:   Principal Problem:   OA (osteoarthritis) of knee  Estimated body mass index is 25.46 kg/(m^2) as calculated from the following:   Height as of this encounter: _0  (1.651 m).   Weight as of this encounter: 69.4 kg (153 lb).  Procedure:  Procedure(s) (LRB): LEFT TOTAL KNEE ARTHROPLASTY (Left)   Consults: None  HPI: Ricky [name] is a 68 y.o. year old male with end stage OA of his left knee with progressively worsening pain and dysfunction. He has constant pain, with activity and at rest and significant functional deficits with difficulties even with ADLs. He has had extensive non-op management including analgesics, injections of cortisone and viscosupplements, and home exercise program, but remains in significant pain with significant dysfunction. Radiographs show bone on bone arthritis all 3 compartments with varus deformity and tibial subluxation. He presents now for left Total Knee Arthroplasty.  Laboratory Data: Admission on 12/14/2013, Discharged on 12/16/2013  Component Date Value Ref Range Status  . ABO/RH(D) 12/14/2013 O POS   Final  . Antibody Screen 12/14/2013 NEG   Final  . Sample Expiration 12/14/2013 12/17/2013   Final  . ABO/RH(D) 12/14/2013 O POS   Final  . WBC 12/15/2013 8.7  4.0 - 10.5 K/uL Final  . RBC 12/15/2013 3.50* 4.22 - 5.81 MIL/uL Final  . Hemoglobin 12/15/2013 11.3* 13.0 - 17.0 g/dL Final  . HCT 12/15/2013 33.0* 39.0 - 52.0 % Final  . MCV 12/15/2013 94.3  78.0 - 100.0 fL Final  . MCH 12/15/2013 32.3  26.0 - 34.0 pg Final  . MCHC 12/15/2013 34.2  30.0 - 36.0 g/dL Final  . RDW 12/15/2013 12.9  11.5 - 15.5 % Final  . Platelets 12/15/2013  240  150 - 400 K/uL Final  . Sodium 12/15/2013 139  137 - 147 mEq/L Final  . Potassium 12/15/2013 4.6  3.7 - 5.3 mEq/L Final  . Chloride 12/15/2013 105  96 - 112 mEq/L Final  . CO2 12/15/2013 22  19 - 32 mEq/L Final  . Glucose, Bld 12/15/2013 138* 70 - 99 mg/dL Final  . BUN 12/15/2013 20  6 - 23 mg/dL Final  . Creatinine, Ser 12/15/2013 0.78  0.50 - 1.35 mg/dL Final  . Calcium 12/15/2013 8.5  8.4 - 10.5 mg/dL Final  . GFR calc non Af Amer 12/15/2013 89* >90 mL/min Final  . GFR calc Af Amer 12/15/2013 >90  >90 mL/min Final   Comment: (NOTE) The eGFR has been calculated using the CKD EPI equation. This calculation has not been validated in all clinical situations. eGFR's persistently <90 mL/min signify possible Chronic Kidney Disease.   . Anion gap 12/15/2013 12  5 - 15 Final  . WBC 12/16/2013 11.8* 4.0 - 10.5 K/uL Final  . RBC 12/16/2013 3.21* 4.22 - 5.81 MIL/uL Final  . Hemoglobin 12/16/2013 10.2* 13.0 - 17.0 g/dL Final  . HCT 12/16/2013 30.4* 39.0 - 52.0 % Final  . MCV 12/16/2013 94.7  78.0 - 100.0 fL Final  . MCH 12/16/2013 31.8  26.0 - 34.0 pg Final  . MCHC 12/16/2013 33.6  30.0 - 36.0 g/dL Final  . RDW 12/16/2013 13.2  11.5 - 15.5 % Final  . Platelets 12/16/2013 223  150 - 400  K/uL Final  . Sodium 12/16/2013 141  137 - 147 mEq/L Final  . Potassium 12/16/2013 3.9  3.7 - 5.3 mEq/L Final  . Chloride 12/16/2013 107  96 - 112 mEq/L Final  . CO2 12/16/2013 27  19 - 32 mEq/L Final  . Glucose, Bld 12/16/2013 128* 70 - 99 mg/dL Final  . BUN 12/16/2013 17  6 - 23 mg/dL Final  . Creatinine, Ser 12/16/2013 0.81  0.50 - 1.35 mg/dL Final  . Calcium 12/16/2013 8.4  8.4 - 10.5 mg/dL Final  . GFR calc non Af Amer 12/16/2013 88* >90 mL/min Final  . GFR calc Af Amer 12/16/2013 >90  >90 mL/min Final   Comment: (NOTE) The eGFR has been calculated using the CKD EPI equation. This calculation has not been validated in all clinical situations. eGFR's persistently <90 mL/min signify possible  Chronic Kidney Disease.   Georgiann Hahn gap 12/16/2013 7  5 - 15 Final  Hospital Outpatient Visit on 12/07/2013  Component Date Value Ref Range Status  . MRSA, PCR 12/07/2013 NEGATIVE  NEGATIVE Final  . Staphylococcus aureus 12/07/2013 NEGATIVE  NEGATIVE Final   Comment:        The Xpert SA Assay (FDA approved for NASAL specimens in patients over 59 years of age), is one component of a comprehensive surveillance program.  Test performance has been validated by [employer] for patients greater than or equal to 64 year old. It is not intended to diagnose infection nor to guide or monitor treatment.   Marland Kitchen aPTT 12/07/2013 32  24 - 37 seconds Final  . WBC 12/07/2013 6.1  4.0 - 10.5 K/uL Final  . RBC 12/07/2013 4.40  4.22 - 5.81 MIL/uL Final  . Hemoglobin 12/07/2013 14.0  13.0 - 17.0 g/dL Final  . HCT 12/07/2013 42.4  39.0 - 52.0 % Final  . MCV 12/07/2013 96.4  78.0 - 100.0 fL Final  . MCH 12/07/2013 31.8  26.0 - 34.0 pg Final  . MCHC 12/07/2013 33.0  30.0 - 36.0 g/dL Final  . RDW 12/07/2013 13.2  11.5 - 15.5 % Final  . Platelets 12/07/2013 345  150 - 400 K/uL Final  . Sodium 12/07/2013 140  137 - 147 mEq/L Final  . Potassium 12/07/2013 4.7  3.7 - 5.3 mEq/L Final  . Chloride 12/07/2013 102  96 - 112 mEq/L Final  . CO2 12/07/2013 28  19 - 32 mEq/L Final  . Glucose, Bld 12/07/2013 113* 70 - 99 mg/dL Final  . BUN 12/07/2013 13  6 - 23 mg/dL Final  . Creatinine, Ser 12/07/2013 0.78  0.50 - 1.35 mg/dL Final  . Calcium 12/07/2013 9.2  8.4 - 10.5 mg/dL Final  . Total Protein 12/07/2013 7.3  6.0 - 8.3 g/dL Final  . Albumin 12/07/2013 3.6  3.5 - 5.2 g/dL Final  . AST 12/07/2013 28  0 - 37 U/L Final  . ALT 12/07/2013 22  0 - 53 U/L Final  . Alkaline Phosphatase 12/07/2013 64  39 - 117 U/L Final  . Total Bilirubin 12/07/2013 0.4  0.3 - 1.2 mg/dL Final  . GFR calc non Af Amer 12/07/2013 89* >90 mL/min Final  . GFR calc Af Amer 12/07/2013 >90  >90 mL/min Final   Comment: (NOTE) The eGFR has  been calculated using the CKD EPI equation. This calculation has not been validated in all clinical situations. eGFR's persistently <90 mL/min signify possible Chronic Kidney Disease.   . Anion gap 12/07/2013 10  5 - 15 Final  . Prothrombin  Time 12/07/2013 13.1  11.6 - 15.2 seconds Final  . INR 12/07/2013 0.99  0.00 - 1.49 Final  . Color, Urine 12/07/2013 YELLOW  YELLOW Final  . APPearance 12/07/2013 CLEAR  CLEAR Final  . Specific Gravity, Urine 12/07/2013 1.018  1.005 - 1.030 Final  . pH 12/07/2013 7.5  5.0 - 8.0 Final  . Glucose, UA 12/07/2013 NEGATIVE  NEGATIVE mg/dL Final  . Hgb urine dipstick 12/07/2013 NEGATIVE  NEGATIVE Final  . Bilirubin Urine 12/07/2013 NEGATIVE  NEGATIVE Final  . Ketones, ur 12/07/2013 NEGATIVE  NEGATIVE mg/dL Final  . Protein, ur 12/07/2013 NEGATIVE  NEGATIVE mg/dL Final  . Urobilinogen, UA 12/07/2013 0.2  0.0 - 1.0 mg/dL Final  . Nitrite 12/07/2013 NEGATIVE  NEGATIVE Final  . Leukocytes, UA 12/07/2013 NEGATIVE  NEGATIVE Final   MICROSCOPIC NOT DONE ON URINES WITH NEGATIVE PROTEIN, BLOOD, LEUKOCYTES, NITRITE, OR GLUCOSE <1000 mg/dL.  Office Visit on 11/30/2013  Component Date Value Ref Range Status  . WBC 11/30/2013 5.9  4.0 - 10.5 K/uL Final  . RBC 11/30/2013 4.20* 4.22 - 5.81 Mil/uL Final  . Hemoglobin 11/30/2013 13.3  13.0 - 17.0 g/dL Final  . HCT 11/30/2013 40.6  39.0 - 52.0 % Final  . MCV 11/30/2013 96.7  78.0 - 100.0 fl Final  . MCHC 11/30/2013 32.7  30.0 - 36.0 g/dL Final  . RDW 11/30/2013 13.4  11.5 - 15.5 % Final  . Platelets 11/30/2013 319.0  150.0 - 400.0 K/uL Final  . Neutrophils Relative % 11/30/2013 68.6  43.0 - 77.0 % Final  . Lymphocytes Relative 11/30/2013 19.7  12.0 - 46.0 % Final  . Monocytes Relative 11/30/2013 8.3  3.0 - 12.0 % Final  . Eosinophils Relative 11/30/2013 3.1  0.0 - 5.0 % Final  . Basophils Relative 11/30/2013 0.3  0.0 - 3.0 % Final  . Neutro Abs 11/30/2013 4.0  1.4 - 7.7 K/uL Final  . Lymphs Abs 11/30/2013 1.2  0.7 -  4.0 K/uL Final  . Monocytes Absolute 11/30/2013 0.5  0.1 - 1.0 K/uL Final  . Eosinophils Absolute 11/30/2013 0.2  0.0 - 0.7 K/uL Final  . Basophils Absolute 11/30/2013 0.0  0.0 - 0.1 K/uL Final  . Sodium 11/30/2013 140  135 - 145 mEq/L Final  . Potassium 11/30/2013 5.3* 3.5 - 5.1 mEq/L Final  . Chloride 11/30/2013 104  96 - 112 mEq/L Final  . CO2 11/30/2013 26  19 - 32 mEq/L Final  . Glucose, Bld 11/30/2013 98  70 - 99 mg/dL Final  . BUN 11/30/2013 16  6 - 23 mg/dL Final  . Creatinine, Ser 11/30/2013 0.8  0.4 - 1.5 mg/dL Final  . Calcium 11/30/2013 9.6  8.4 - 10.5 mg/dL Final  . GFR 11/30/2013 97.35  >60.00 mL/min Final  . Total Bilirubin 11/30/2013 0.7  0.2 - 1.2 mg/dL Final  . Bilirubin, Direct 11/30/2013 0.1  0.0 - 0.3 mg/dL Final  . Alkaline Phosphatase 11/30/2013 54  39 - 117 U/L Final  . AST 11/30/2013 27  0 - 37 U/L Final  . ALT 11/30/2013 19  0 - 53 U/L Final  . Total Protein 11/30/2013 6.8  6.0 - 8.3 g/dL Final  . Albumin 11/30/2013 3.3* 3.5 - 5.2 g/dL Final  . Color, UA 11/30/2013 yellow   Final  . Clarity, UA 11/30/2013 clear   Final  . Glucose, UA 11/30/2013 n   Final  . Bilirubin, UA 11/30/2013 n   Final  . Ketones, UA 11/30/2013 n   Final  . Fritzi Mandes  Grav, UA 11/30/2013 1.015   Final  . Blood, UA 11/30/2013 n   Final  . pH, UA 11/30/2013 7.5   Final  . Protein, UA 11/30/2013 n   Final  . Urobilinogen, UA 11/30/2013 0.2   Final  . Nitrite, UA 11/30/2013 n   Final  . Leukocytes, UA 11/30/2013 Negative   Final  . PSA 11/30/2013 1.22  0.10 - 4.00 ng/mL Final     X-Rays:Dg Chest 2 View  12/07/2013   CLINICAL DATA:  Preop for are total knee replacement. Hypertension history.  EXAM: CHEST  2 VIEW  COMPARISON:  None.  FINDINGS: No cardiomegaly. The aorta is markedly tortuous, but not definitively aneurysmal.  There is no edema, consolidation, effusion, or pneumothorax.  Density at the left apex, overlapping a portion of the anterior first rib is likely osseous, but cannot  entirely exclude an overlapping pulmonary nodule measuring 16 mm.  IMPRESSION: 1. No active cardiopulmonary disease. 2. Tortuous aorta, common with chronic hypertension. 3. Nodular density at the left apex is likely osseous, but noncontrast chest CT recommended to exclude a 16 mm pulmonary nodule (if no outside comparison available).   Electronically Signed   By: Jorje Guild M.D.   On: 12/07/2013 09:26   Ct Chest Wo Contrast  12/10/2013   CLINICAL DATA:  Nodular density in left apex seen on preoperative chest radiographs.  EXAM: CT CHEST WITHOUT CONTRAST  TECHNIQUE: Multidetector CT imaging of the chest was performed following the standard protocol without IV contrast.  COMPARISON:  Chest radiographs 12/07/2013  FINDINGS: Subcentimeter bilateral axillary lymph nodes are likely reactive. No enlarged mediastinal or hilar lymph nodes are identified. Heart is normal in size. There is moderate LAD coronary artery calcification. Thoracic aorta is tortuous.  No pleural or pericardial effusion is seen. Small right-sided Bochdalek's hernia is noted containing fat. There is mild anterior eventration of the left hemidiaphragm. Subpleural ground-glass opacities in the dependent right greater than left lower lobes are favored to represent subsegmental atelectasis. Mild scarring or atelectasis is noted in the lingula and right middle lobe. No lung nodules are identified. Left apical density on chest radiographs corresponds to osseous overgrowth of the first costochondral articulation.  4.6 cm hypodensity is partially visualized in the upper pole the right kidney, likely a cyst but incompletely evaluated. Small metallic densities are partially visualized adjacent to the proximal duodenum, query prior surgery in this area. There is grade 1 retrolisthesis L1 on L2.  IMPRESSION: No lung nodules or acute abnormality identified. Incidental findings as above.   Electronically Signed   By: Logan Bores   On: 12/10/2013 17:06     EKG: Orders placed or performed in visit on 11/30/13  . EKG 12-Lead     Hospital Course: Ricky [name] is a 68 y.o. who was admitted to [facility]. They were brought to the operating room on 12/14/2013 and underwent Procedure(s): LEFT TOTAL KNEE ARTHROPLASTY.  Patient tolerated the procedure well and was later transferred to the recovery room and then to the orthopaedic floor for postoperative care.  They were given PO and IV analgesics for pain control following their surgery.  They were given 24 hours of postoperative antibiotics of  Anti-infectives    Start     Dose/Rate Route Frequency Ordered Stop   12/14/13 2100  ceFAZolin (ANCEF) IVPB 2 g/50 mL premix     2 g100 mL/hr over 30 Minutes Intravenous Every 6 hours 12/14/13 2010 12/15/13 0415   12/14/13 1138  ceFAZolin (ANCEF)  IVPB 2 g/50 mL premix     2 g100 mL/hr over 30 Minutes Intravenous On call to O.R. 12/14/13 1138 12/14/13 1522     and started on DVT prophylaxis in the form of Xarelto.   PT and OT were ordered for total joint protocol.  Discharge planning consulted to help with postop disposition and equipment needs.  Patient had a togh night on the evening of surgery with some pain but doing better the next morning.  They started to get up OOB with therapy on day one. Hemovac drain was pulled without difficulty.  Continued to work with therapy into day two.  Dressing was changed on day two and the incision was healing well.  Patient was seen in rounds and was ready to go home later that afternoon.  Discharge home with home health Diet - Cardiac diet Follow up - in 2 weeks Activity - WBAT Disposition - Home Condition Upon Discharge - Good D/C Meds - See DC Summary DVT Prophylaxis - Xarelto       Discharge Instructions    Call MD / Call 911    Complete by:  As directed   If you experience chest pain or shortness of breath, CALL 911 and be transported to the hospital emergency room.  If you develope a fever above  101 F, pus (white drainage) or increased drainage or redness at the wound, or calf pain, call your surgeon's office.     Change dressing    Complete by:  As directed   Change dressing daily with sterile 4 x 4 inch gauze dressing and apply TED hose. Do not submerge the incision under water.     Constipation Prevention    Complete by:  As directed   Drink plenty of fluids.  Prune juice may be helpful.  You may use a stool softener, such as Colace (over the counter) 100 mg twice a day.  Use MiraLax (over the counter) for constipation as needed.     Diet - low sodium heart healthy    Complete by:  As directed      Discharge instructions    Complete by:  As directed   Pick up stool softner and laxative for home. Do not submerge incision under water. May shower. Continue to use ice for pain and swelling from surgery.  Take Xarelto for two and a half more weeks, then discontinue Xarelto. Once the patient has completed the blood thinner regimen, then take a Baby 81 mg Aspirin daily for three more weeks.     Do not put a pillow under the knee. Place it under the heel.    Complete by:  As directed      Do not sit on low chairs, stoools or toilet seats, as it may be difficult to get up from low surfaces    Complete by:  As directed      Driving restrictions    Complete by:  As directed   No driving until released by the physician.     Increase activity slowly as tolerated    Complete by:  As directed      Lifting restrictions    Complete by:  As directed   No lifting until released by the physician.     Patient may shower    Complete by:  As directed   You may shower without a dressing once there is no drainage.  Do not wash over the wound.  If drainage remains, do not shower until  drainage stops.     TED hose    Complete by:  As directed   Use stockings (TED hose) for 3 weeks on both leg(s).  You may remove them at night for sleeping.     Weight bearing as tolerated    Complete by:  As  directed             Medication List    STOP taking these medications        ALEVE 220 MG Caps  Generic drug:  Naproxen Sodium     ibuprofen 200 MG tablet  Commonly known as:  ADVIL,MOTRIN     multivitamin with minerals Tabs tablet      TAKE these medications        atenolol 50 MG tablet  Commonly known as:  TENORMIN  Take 50 mg by mouth every morning.     methocarbamol 500 MG tablet  Commonly known as:  ROBAXIN  Take 1 tablet (500 mg total) by mouth every 6 (six) hours as needed for muscle spasms.     oxyCODONE 5 MG immediate release tablet  Commonly known as:  Oxy IR/ROXICODONE  Take 1-2 tablets (5-10 mg total) by mouth every 3 (three) hours as needed for moderate pain, severe pain or breakthrough pain.     rivaroxaban 10 MG Tabs tablet  Commonly known as:  XARELTO  - Take 1 tablet (10 mg total) by mouth daily with breakfast. Take Xarelto for two and a half more weeks, then discontinue Xarelto.  - Once the patient has completed the blood thinner regimen, then take a Baby 81 mg Aspirin daily for three more weeks.     traMADol 50 MG tablet  Commonly known as:  ULTRAM  Take 1-2 tablets (50-100 mg total) by mouth every 6 (six) hours as needed (mild pain).       Follow-up Information    Follow up with [facility].   Why:  3n1 (commode) and rolling walker   Contact information:   [address] [place] [address] [phone]       Follow up with [facility].   Why:  home health physical therapy   Contact information:   [address] [place] [address] [phone]       Follow up with Gearlean Alf, MD. Schedule an appointment as soon as possible for a visit on 12/29/2013.   Specialty:  Orthopedic Surgery   Why:  Call office at [phone] for follow up on Tuesday 12/29/2013.   Contact information:   [address] [place] [address] [phone]       Signed: Arlee Muslim,  PA-C Orthopaedic Surgery 12/24/2013, 9:35 AM

## 2013-12-15 NOTE — Discharge Instructions (Signed)
° °Dr. Frank Aluisio °Total Joint Specialist °[facility] °[address] °[place], [place] [address] °[phone] ° °TOTAL KNEE REPLACEMENT POSTOPERATIVE DIRECTIONS ° ° ° °Knee Rehabilitation, Guidelines Following Surgery  °Results after knee surgery are often greatly improved when you follow the exercise, range of motion and muscle strengthening exercises prescribed by your doctor. Safety measures are also important to protect the knee from further injury. Any time any of these exercises cause you to have increased pain or swelling in your knee joint, decrease the amount until you are comfortable again and slowly increase them. If you have problems or questions, call your caregiver or physical therapist for advice.  ° °HOME CARE INSTRUCTIONS  °Remove items at home which could result in a fall. This includes throw rugs or furniture in walking pathways.  °Continue medications as instructed at time of discharge. °You may have some home medications which will be placed on hold until you complete the course of blood thinner medication.  °You may start showering once you are discharged home but do not submerge the incision under water. Just pat the incision dry and apply a dry gauze dressing on daily. °Walk with walker as instructed.  °You may resume a sexual relationship in one month or when given the OK by  your doctor.  °· Use walker as long as suggested by your caregivers. °· Avoid periods of inactivity such as sitting longer than an hour when not asleep. This helps prevent blood clots.  °You may put full weight on your legs and walk as much as is comfortable.  °You may return to work once you are cleared by your doctor.  °Do not drive a car for 6 weeks or until released by you surgeon.  °· Do not drive while taking narcotics.  °Wear the elastic stockings for three weeks following surgery during the day but you may remove then at night. °Make sure you keep all of your appointments after your  operation with all of your doctors and caregivers. You should call the office at the above phone number and make an appointment for approximately two weeks after the date of your surgery. °Change the dressing daily and reapply a dry dressing each time. °Please pick up a stool softener and laxative for home use as long as you are requiring pain medications. °· Continue to use ice on the knee for pain and swelling from surgery. You may notice swelling that will progress down to the foot and ankle.  This is normal after surgery.  Elevate the leg when you are not up walking on it.   °It is important for you to complete the blood thinner medication as prescribed by your doctor. °· Continue to use the breathing machine which will help keep your temperature down.  It is common for your temperature to cycle up and down following surgery, especially at night when you are not up moving around and exerting yourself.  The breathing machine keeps your lungs expanded and your temperature down. ° °RANGE OF MOTION AND STRENGTHENING EXERCISES  °Rehabilitation of the knee is important following a knee injury or an operation. After just a few days of immobilization, the muscles of the thigh which control the knee become weakened and shrink (atrophy). Knee exercises are designed to build up the tone and strength of the thigh muscles and to improve knee motion. Often times heat used for twenty to thirty minutes before working out will loosen up your tissues and help with improving the   range of motion but do not use heat for the first two weeks following surgery. These exercises can be done on a training (exercise) mat, on the floor, on a table or on a bed. Use what ever works the best and is most comfortable for you Knee exercises include:  Leg Lifts - While your knee is still immobilized in a splint or cast, you can do straight leg raises. Lift the leg to 60 degrees, hold for 3 sec, and slowly lower the leg. Repeat 10-20 times 2-3  times daily. Perform this exercise against resistance later as your knee gets better.  Quad and Hamstring Sets - Tighten up the muscle on the front of the thigh (Quad) and hold for 5-10 sec. Repeat this 10-20 times hourly. Hamstring sets are done by pushing the foot backward against an object and holding for 5-10 sec. Repeat as with quad sets.  A rehabilitation program following serious knee injuries can speed recovery and prevent re-injury in the future due to weakened muscles. Contact your doctor or a physical therapist for more information on knee rehabilitation.   SKILLED REHAB INSTRUCTIONS: If the patient is transferred to a skilled rehab facility following release from the hospital, a list of the current medications will be sent to the facility for the patient to continue.  When discharged from the skilled rehab facility, please have the facility set up the patient's [place] prior to being released. Also, the skilled facility will be responsible for providing the patient with their medications at time of release from the facility to include their pain medication, the muscle relaxants, and their blood thinner medication. If the patient is still at the rehab facility at time of the two week follow up appointment, the skilled rehab facility will also need to assist the patient in arranging follow up appointment in our office and any transportation needs.  MAKE SURE YOU:  Understand these instructions.  Will watch your condition.  Will get help right away if you are not doing well or get worse.    Pick up stool softner and laxative for home. Do not submerge incision under water. May shower. Continue to use ice for pain and swelling from surgery.  Take Xarelto for two and a half more weeks, then discontinue Xarelto. Once the patient has completed the blood thinner regimen, then take a Baby 81 mg Aspirin daily for three more weeks.  Postoperative Constipation  Protocol  Constipation - defined medically as fewer than three stools per week and severe constipation as less than one stool per week.  One of the most common issues patients have following surgery is constipation.  Even if you have a regular bowel pattern at home, your normal regimen is likely to be disrupted due to multiple reasons following surgery.  Combination of anesthesia, postoperative narcotics, change in appetite and fluid intake all can affect your bowels.  In order to avoid complications following surgery, here are some recommendations in order to help you during your recovery period.  Colace (docusate) - Pick up an over-the-counter form of Colace or another stool softener and take twice a day as long as you are requiring postoperative pain medications.  Take with a full glass of water daily.  If you experience loose stools or diarrhea, hold the colace until you stool forms back up.  If your symptoms do not get better within 1 week or if they get worse, check with your doctor.  Dulcolax (bisacodyl) - Pick up over-the-counter and  take as directed by the product packaging as needed to assist with the movement of your bowels.  Take with a full glass of water.  Use this product as needed if not relieved by Colace only.   MiraLax (polyethylene glycol) - Pick up over-the-counter to have on hand.  MiraLax is a solution that will increase the amount of water in your bowels to assist with bowel movements.  Take as directed and can mix with a glass of water, juice, soda, coffee, or tea.  Take if you go more than two days without a movement. Do not use MiraLax more than once per day. Call your doctor if you are still constipated or irregular after using this medication for 7 days in a row.  If you continue to have problems with postoperative constipation, please contact the office for further assistance and recommendations.  If you experience "the worst abdominal pain ever" or develop nausea or  vomiting, please contact the office immediatly for further recommendations for treatment.  Information on my medicine - XARELTO (Rivaroxaban)  This medication education was reviewed with me or my healthcare representative as part of my discharge preparation.  The pharmacist that spoke with me during my hospital stay was:  Minda Ditto, [facility]  Why was Xarelto prescribed for you? Xarelto was prescribed for you to reduce the risk of blood clots forming after orthopedic surgery. The medical term for these abnormal blood clots is venous thromboembolism (VTE).  What do you need to know about xarelto ? Take your Xarelto ONCE DAILY at the same time every day. You may take it either with or without food.  If you have difficulty swallowing the tablet whole, you may crush it and mix in applesauce just prior to taking your dose.  Take Xarelto exactly as prescribed by your doctor and DO NOT stop taking Xarelto without talking to the doctor who prescribed the medication.  Stopping without other VTE prevention medication to take the place of Xarelto may increase your risk of developing a clot.  After discharge, you should have regular check-up appointments with your healthcare provider that is prescribing your Xarelto.    What do you do if you miss a dose? If you miss a dose, take it as soon as you remember on the same day then continue your regularly scheduled once daily regimen the next day. Do not take two doses of Xarelto on the same day.   Important Safety Information A possible side effect of Xarelto is bleeding. You should call your healthcare provider right away if you experience any of the following: ? Bleeding from an injury or your nose that does not stop. ? Unusual colored urine (red or dark brown) or unusual colored stools (red or black). ? Unusual bruising for unknown reasons. ? A serious fall or if you hit your head (even if there is no bleeding).  Some medicines may interact  with Xarelto and might increase your risk of bleeding while on Xarelto. To help avoid this, consult your healthcare provider or pharmacist prior to using any new prescription or non-prescription medications, including herbals, vitamins, non-steroidal anti-inflammatory drugs (NSAIDs) and supplements.  This website has more information on Xarelto: [email or web address]

## 2013-12-15 NOTE — Evaluation (Signed)
Occupational Therapy Evaluation Patient Details Name: Ricky [name] MRN: [id number] DOB: 30-Apr-1945 Today's Date: 12/15/2013    History of Present Illness Pt is s/p L TKA   Clinical Impression   Pt dong well. Up to bathroom to void and practiced 3in1 transfer. Will follow for acute OT to progress ADL independence.     Follow Up Recommendations  No OT follow up;Supervision/Assistance - 24 hour    Equipment Recommendations  3 in 1 bedside comode    Recommendations for Other Services       Precautions / Restrictions Precautions Precautions: Knee Restrictions Weight Bearing Restrictions: No Other Position/Activity Restrictions: WBAT      Mobility Bed Mobility                  Transfers Overall transfer level: Needs assistance Equipment used: Rolling walker (2 wheeled) Transfers: Sit to/from Stand Sit to Stand: Min assist         General transfer comment: verbal cues for hand placement and LE management.    Balance                                            ADL Overall ADL's : Needs assistance/impaired Eating/Feeding: Independent;Sitting   Grooming: Set up;Sitting   Upper Body Bathing: Set up;Sitting   Lower Body Bathing: Minimal assistance;Sit to/from stand   Upper Body Dressing : Set up;Sitting   Lower Body Dressing: Moderate assistance;Sit to/from stand   Toilet Transfer: Minimal assistance;Ambulation;BSC;RW   Toileting- Clothing Manipulation and Hygiene: Minimal assistance;Sit to/from stand         General ADL Comments: Discussed sequencing for LB dressing and introduced AE. Pt states wife can assist with LB self care. Discussed 3in1 and pt concerned whether it will fit in space at home. Pt has a tub and discussed option of a tubseat and where to obtain. Pt stood to use urinal and voided.      Vision                     Perception     Praxis      Pertinent Vitals/Pain Pain Assessment: 0-10 Pain Score:  6  Pain Location: L knee Pain Descriptors / Indicators: Aching Pain Intervention(s): Other (comment);Repositioned;Ice applied (nursing in room to give pain meds)     Hand Dominance     Extremity/Trunk Assessment Upper Extremity Assessment Upper Extremity Assessment: Overall WFL for tasks assessed           Communication Communication Communication: No difficulties   Cognition Arousal/Alertness: Awake/alert Behavior During Therapy: WFL for tasks assessed/performed Overall Cognitive Status: Within Functional Limits for tasks assessed                     General Comments       Exercises       Shoulder Instructions      Home Living Family/patient expects to be discharged to:: Private residence Living Arrangements: Spouse/significant other Available Help at Discharge: Family Type of Home: House Home Access: Stairs to enter Technical brewer of Steps: 5 Entrance Stairs-Rails: None Home Layout: Two level Alternate Level Stairs-Number of Steps: 1 flight   Bathroom Shower/Tub: Teacher, early years/pre: Standard     Home Equipment: None          Prior Functioning/Environment Level of Independence: Independent  OT Diagnosis: Generalized weakness   OT Problem List: Decreased strength;Decreased knowledge of use of DME or AE   OT Treatment/Interventions: Self-care/ADL training;Patient/family education;Therapeutic activities;DME and/or AE instruction    OT Goals(Current goals can be found in the care plan section) Acute Rehab OT Goals Patient Stated Goal: none stated. OT Goal Formulation: With patient Time For Goal Achievement: 12/22/13 Potential to Achieve Goals: Good  OT Frequency: Min 2X/week   Barriers to D/C:            Co-evaluation              End of Session Equipment Utilized During Treatment: Gait belt;Rolling walker;Left knee immobilizer  Activity Tolerance: Patient tolerated treatment well Patient  left: in chair;with call bell/phone within reach   Time: 1058-1130 OT Time Calculation (min): 32 min Charges:  OT General Charges $OT Visit: 1 Procedure OT Evaluation $Initial OT Evaluation Tier I: 1 Procedure OT Treatments $Self Care/Home Management : 8-22 mins $Therapeutic Activity: 8-22 mins G-Codes:    Jules Schick  [phone] 12/15/2013, 11:50 AM

## 2013-12-15 NOTE — Evaluation (Signed)
Physical Therapy Evaluation Patient Details Name: Ricky [name] MRN: [id number] DOB: 1945/06/11 Today's Date: 12/15/2013   History of Present Illness  68 yo male s/p L TKA.   Clinical Impression  On eval, pt required Min guard assist for mobility-able to ambulate ~100 feet with walker. Tolerated well. Plan is for home possibly tomorrow. Has a flight of steps to get up to bedroom.     Follow Up Recommendations Home health PT    Equipment Recommendations  Rolling walker with 5" wheels    Recommendations for Other Services OT consult     Precautions / Restrictions Precautions Precautions: Knee;Fall Required Braces or Orthoses: Knee Immobilizer - Left Knee Immobilizer - Left: Discontinue once straight leg raise with < 10 degree lag Restrictions Weight Bearing Restrictions: No LLE Weight Bearing: Weight bearing as tolerated Other Position/Activity Restrictions: WBAT      Mobility  Bed Mobility Overal bed mobility: Needs Assistance Bed Mobility: Supine to Sit     Supine to sit: Min guard        Transfers Overall transfer level: Needs assistance Equipment used: Rolling walker (2 wheeled) Transfers: Sit to/from Stand Sit to Stand: Min guard         General transfer comment: verbal cues for hand placement and LE management.  Ambulation/Gait Ambulation/Gait assistance: Min guard Ambulation Distance (Feet): 100 Feet Assistive device: Rolling walker (2 wheeled) Gait Pattern/deviations: Step-to pattern;Decreased stride length;Antalgic     General Gait Details: VCs safety, technique, sequence.   Stairs            Wheelchair Mobility    Modified Rankin (Stroke Patients Only)       Balance                                             Pertinent Vitals/Pain Pain Assessment: 0-10 Pain Score: 6  Pain Location: L knee Pain Descriptors / Indicators: Aching Pain Intervention(s): Monitored during session;Ice applied    Home Living  Family/patient expects to be discharged to:: Private residence Living Arrangements: Spouse/significant other Available Help at Discharge: Family Type of Home: House Home Access: Stairs to enter Entrance Stairs-Rails: None Technical brewer of Steps: 5 Home Layout: Two level Home Equipment: None      Prior Function Level of Independence: Independent               Hand Dominance        Extremity/Trunk Assessment   Upper Extremity Assessment: Defer to OT evaluation           Lower Extremity Assessment: LLE deficits/detail   LLE Deficits / Details: hip flex 3-/5, hip abd/add 3-/5, moves ankle  Cervical / Trunk Assessment: Normal  Communication   Communication: No difficulties  Cognition Arousal/Alertness: Awake/alert Behavior During Therapy: WFL for tasks assessed/performed Overall Cognitive Status: Within Functional Limits for tasks assessed                      General Comments      Exercises Total Joint Exercises Ankle Circles/Pumps: AROM;10 reps;Both;Supine Quad Sets: AROM;Both;10 reps;Supine Heel Slides: AAROM;Left;10 reps;Supine Hip ABduction/ADduction: AROM;Left;10 reps;Supine Straight Leg Raises: AROM;AAROM;Left;10 reps;Supine Goniometric ROM: 10-45 degrees      Assessment/Plan    PT Assessment Patient needs continued PT services  PT Diagnosis Difficulty walking;Abnormality of gait;Acute pain   PT Problem List Decreased strength;Decreased range of motion;Decreased mobility;Pain;Decreased knowledge of  use of DME  PT Treatment Interventions DME instruction;Gait training;Stair training;Functional mobility training;Therapeutic activities;Therapeutic exercise;Patient/family education;Balance training   PT Goals (Current goals can be found in the Care Plan section) Acute Rehab PT Goals Patient Stated Goal: home soon.  PT Goal Formulation: With patient Time For Goal Achievement: 12/22/13 Potential to Achieve Goals: Good    Frequency  7X/week   Barriers to discharge        Co-evaluation               End of Session Equipment Utilized During Treatment: Gait belt Activity Tolerance: Patient tolerated treatment well Patient left: in chair;with call bell/phone within reach           Time: 1026-1055 PT Time Calculation (min) (ACUTE ONLY): 29 min   Charges:   PT Evaluation $Initial PT Evaluation Tier I: 1 Procedure PT Treatments $Gait Training: 8-22 mins $Therapeutic Exercise: 8-22 mins   PT G Codes:          Weston Anna, MPT Pager: [phone]

## 2013-12-15 NOTE — Progress Notes (Signed)
   Subjective: 1 Day Post-Op Procedure(s) (LRB): LEFT TOTAL KNEE ARTHROPLASTY (Left) Patient reports pain as mild and moderate.   Patient seen in rounds with Dr. Wynelle Link. Patient is well, and has had no acute complaints or problems We will start therapy today.  Plan is to go Home after hospital stay.  Objective: Vital signs in last 24 hours: Temp:  [97.4 F (36.3 C)-98.6 F (37 C)] 98.3 F (36.8 C) (11/10 0547) Pulse Rate:  [40-55] 55 (11/10 0547) Resp:  [12-17] 16 (11/10 0547) BP: (89-153)/(48-114) 105/67 mmHg (11/10 0547) SpO2:  [98 %-100 %] 98 % (11/10 0547) Weight:  [69.4 kg (153 lb)] 69.4 kg (153 lb) (11/09 1207)  Intake/Output from previous day:  Intake/Output Summary (Last 24 hours) at 12/15/13 0846 Last data filed at 12/15/13 0600  Gross per 24 hour  Intake 4298.33 ml  Output   1950 ml  Net 2348.33 ml   Labs:  Recent Labs  12/15/13 0405  HGB 11.3*    Recent Labs  12/15/13 0405  WBC 8.7  RBC 3.50*  HCT 33.0*  PLT 240    Recent Labs  12/15/13 0405  NA 139  K 4.6  CL 105  CO2 22  BUN 20  CREATININE 0.78  GLUCOSE 138*  CALCIUM 8.5   No results for input(s): LABPT, INR in the last 72 hours.  EXAM General - Patient is Alert, Appropriate and Oriented Extremity - Neurovascular intact Sensation intact distally Dressing - dressing C/D/I Motor Function - intact, moving foot and toes well on exam.  Hemovac pulled without difficulty.  Past Medical History  Diagnosis Date  . Hypertension   . Arthritis     Assessment/Plan: 1 Day Post-Op Procedure(s) (LRB): LEFT TOTAL KNEE ARTHROPLASTY (Left) Principal Problem:   OA (osteoarthritis) of knee  Estimated body mass index is 25.46 kg/(m^2) as calculated from the following:   Height as of this encounter: 5\' 5"  (1.651 m).   Weight as of this encounter: 69.4 kg (153 lb). Advance diet Up with therapy Plan for discharge tomorrow Discharge home with home health  DVT Prophylaxis -  Xarelto Weight-Bearing as tolerated to left leg D/C O2 and Pulse OX and try on Room Air  Arlee Muslim, PA-C Orthopaedic Surgery 12/15/2013, 8:46 AM

## 2013-12-16 LAB — CBC
HEMATOCRIT: 30.4 % — AB (ref 39.0–52.0)
Hemoglobin: 10.2 g/dL — ABNORMAL LOW (ref 13.0–17.0)
MCH: 31.8 pg (ref 26.0–34.0)
MCHC: 33.6 g/dL (ref 30.0–36.0)
MCV: 94.7 fL (ref 78.0–100.0)
Platelets: 223 10*3/uL (ref 150–400)
RBC: 3.21 MIL/uL — ABNORMAL LOW (ref 4.22–5.81)
RDW: 13.2 % (ref 11.5–15.5)
WBC: 11.8 10*3/uL — ABNORMAL HIGH (ref 4.0–10.5)

## 2013-12-16 LAB — BASIC METABOLIC PANEL
ANION GAP: 7 (ref 5–15)
BUN: 17 mg/dL (ref 6–23)
CHLORIDE: 107 meq/L (ref 96–112)
CO2: 27 mEq/L (ref 19–32)
Calcium: 8.4 mg/dL (ref 8.4–10.5)
Creatinine, Ser: 0.81 mg/dL (ref 0.50–1.35)
GFR calc Af Amer: >90 mL/min (ref >90)
GFR calc non Af Amer: 88 mL/min — ABNORMAL LOW (ref >90)
Glucose, Bld: 128 mg/dL — ABNORMAL HIGH (ref 70–99)
POTASSIUM: 3.9 meq/L (ref 3.7–5.3)
SODIUM: 141 meq/L (ref 137–147)

## 2013-12-16 NOTE — Progress Notes (Signed)
Physical Therapy Treatment Patient Details Name: [name] MRN: [id number] DOB: [birth year] Today's Date: 12/16/2013    History of Present Illness 68 yo male s/p L TKA.     PT Comments    Pt reports increased pain today-began earlier this morning. Pt rates pain as 8/10 with activity. Pt was able to ambulate and perform exercises. Will plan to practice stair negotiation later today.   Follow Up Recommendations  Home health PT     Equipment Recommendations  Rolling walker with 5" wheels    Recommendations for Other Services OT consult     Precautions / Restrictions Precautions Precautions: Knee;Fall Required Braces or Orthoses: Knee Immobilizer - Left Knee Immobilizer - Left: Discontinue once straight leg raise with < 10 degree lag Restrictions Weight Bearing Restrictions: No LLE Weight Bearing: Weight bearing as tolerated    Mobility  Bed Mobility Overal bed mobility: Needs Assistance Bed Mobility: Supine to Sit     Supine to sit: Min assist     General bed mobility comments: assist for L LE  Transfers Overall transfer level: Needs assistance Equipment used: Rolling walker (2 wheeled) Transfers: Sit to/from Stand Sit to Stand: Min guard         General transfer comment: verbal cues for hand placement and LE management.  Ambulation/Gait Ambulation/Gait assistance: Min guard Ambulation Distance (Feet): 150 Feet Assistive device: Rolling walker (2 wheeled) Gait Pattern/deviations: Step-to pattern;Decreased stride length;Antalgic;Decreased stance time - left     General Gait Details: Gait slightly more antalgic today. close guard for safety. Pt reports increased pain today as well.    Stairs            Wheelchair Mobility    Modified Rankin (Stroke Patients Only)       Balance                                    Cognition Arousal/Alertness: Awake/alert Behavior During Therapy: WFL for tasks assessed/performed Overall  Cognitive Status: Within Functional Limits for tasks assessed                      Exercises Total Joint Exercises Ankle Circles/Pumps: AROM;Both;15 reps;Seated Quad Sets: AROM;Both;10 reps;Seated Heel Slides: AAROM;Left;10 reps;Seated Hip ABduction/ADduction: AROM;Left;10 reps;Seated Straight Leg Raises: AROM;AAROM;Left;10 reps;Seated Goniometric ROM: 10-40 degrees (decreased ROM due to pain most likely)    General Comments        Pertinent Vitals/Pain Pain Assessment: 0-10 Pain Score: 8  Pain Location: L knee Pain Intervention(s): Monitored during session;Ice applied;Repositioned    Home Living                      Prior Function            PT Goals (current goals can now be found in the care plan section) Progress towards PT goals: Progressing toward goals    Frequency  7X/week    PT Plan Current plan remains appropriate    Co-evaluation             End of Session Equipment Utilized During Treatment: Gait belt;Left knee immobilizer Activity Tolerance: Patient limited by pain Patient left: in chair;with call bell/phone within reach     Time: 0927-1007 PT Time Calculation (min) (ACUTE ONLY): 40 min  Charges:  $Gait Training: 8-22 mins $Therapeutic Exercise: 8-22 mins $Therapeutic Activity: 8-22 mins  G Codes:      Weston Anna, MPT Pager: [phone]

## 2013-12-16 NOTE — Plan of Care (Signed)
Problem: Phase I Progression Outcomes Goal: Other Phase I Outcomes/Goals Outcome: Completed/Met Date Met:  12/16/13     

## 2013-12-16 NOTE — Progress Notes (Signed)
   Subjective: 2 Days Post-Op Procedure(s) (LRB): LEFT TOTAL KNEE ARTHROPLASTY (Left) Patient reports pain as mild and moderate.   Patient seen in rounds with Dr. Wynelle Link. Patient is well, but has had some minor complaints of pain in the knee, requiring pain medications since up with therapy yesterday. Patient is ready to go home later today but will get two sessions before discharge  Objective: Vital signs in last 24 hours: Temp:  [97.5 F (36.4 C)-98.8 F (37.1 C)] 98.8 F (37.1 C) (11/11 0502) Pulse Rate:  [51-55] 55 (11/11 0502) Resp:  [15-16] 16 (11/11 0502) BP: (117-154)/(63-79) 126/65 mmHg (11/11 0502) SpO2:  [96 %-100 %] 98 % (11/11 0502)  Intake/Output from previous day:  Intake/Output Summary (Last 24 hours) at 12/16/13 0754 Last data filed at 12/16/13 0600  Gross per 24 hour  Intake 3461.67 ml  Output   1500 ml  Net 1961.67 ml     Labs:  Recent Labs  12/15/13 0405 12/16/13 0415  HGB 11.3* 10.2*    Recent Labs  12/15/13 0405 12/16/13 0415  WBC 8.7 11.8*  RBC 3.50* 3.21*  HCT 33.0* 30.4*  PLT 240 223    Recent Labs  12/15/13 0405 12/16/13 0415  NA 139 141  K 4.6 3.9  CL 105 107  CO2 22 27  BUN 20 17  CREATININE 0.78 0.81  GLUCOSE 138* 128*  CALCIUM 8.5 8.4   No results for input(s): LABPT, INR in the last 72 hours.  EXAM: General - Patient is Alert, Appropriate and Oriented Extremity - Neurovascular intact Sensation intact distally Dorsiflexion/Plantar flexion intact Incision - clean, dry, no drainage, healing Motor Function - intact, moving foot and toes well on exam.   Assessment/Plan: 2 Days Post-Op Procedure(s) (LRB): LEFT TOTAL KNEE ARTHROPLASTY (Left) Procedure(s) (LRB): LEFT TOTAL KNEE ARTHROPLASTY (Left) Past Medical History  Diagnosis Date  . Hypertension   . Arthritis    Principal Problem:   OA (osteoarthritis) of knee  Estimated body mass index is 25.46 kg/(m^2) as calculated from the following:   Height as of  this encounter: 5\' 5"  (1.651 m).   Weight as of this encounter: 69.4 kg (153 lb). Up with therapy Discharge home with home health Diet - Cardiac diet Follow up - in 2 weeks Activity - WBAT Disposition - Home Condition Upon Discharge - Good D/C Meds - See DC Summary DVT Prophylaxis - Xarelto  Arlee Muslim, PA-C Orthopaedic Surgery 12/16/2013, 7:54 AM

## 2013-12-16 NOTE — Plan of Care (Signed)
Problem: Phase I Progression Outcomes Goal: Dangle or out of bed evening of surgery Outcome: Completed/Met Date Met:  12/16/13

## 2013-12-16 NOTE — Plan of Care (Signed)
Problem: Phase II Progression Outcomes Goal: Other Phase II Outcomes/Goals Outcome: Completed/Met Date Met:  12/16/13

## 2013-12-16 NOTE — Plan of Care (Signed)
Problem: Phase II Progression Outcomes Goal: Ambulates Outcome: Completed/Met Date Met:  12/16/13

## 2013-12-16 NOTE — Progress Notes (Signed)
Occupational Therapy Treatment Patient Details Name: Ricky [name] MRN: [id number] DOB: [birth year] Today's Date: 12/16/2013    History of present illness 68 yo male s/p L TKA.    OT comments  Pt stating pain is much worse today but overall doing very well as far as his functional activities. Wife can assist at home. Educated on AE options and practiced toilet transfers with 3in1. Pt to hopefully d/c this afternoon.   Follow Up Recommendations  No OT follow up;Supervision/Assistance - 24 hour    Equipment Recommendations  3 in 1 bedside comode    Recommendations for Other Services      Precautions / Restrictions Precautions Precautions: Knee;Fall Required Braces or Orthoses: Knee Immobilizer - Left Knee Immobilizer - Left: Discontinue once straight leg raise with < 10 degree lag Restrictions Weight Bearing Restrictions: No LLE Weight Bearing: Weight bearing as tolerated       Mobility Bed Mobility             Transfers Overall transfer level: Needs assistance Equipment used: Rolling walker (2 wheeled) Transfers: Sit to/from Stand Sit to Stand: Min guard         General transfer comment: verbal cues for hand placement and LE management.    Balance                                   ADL                           Toilet Transfer: Min guard;Ambulation;BSC;RW   Toileting- Water quality scientist and Hygiene: Min guard;Sit to/from stand         General ADL Comments: Pt Interested in [place] so demonstrated all AE pieces and pt states he will likely obtain kit. he verbalizes undestanding of how to use pieces based on demonstration. Wife also able to assist. Pt with concerns about 3in1 fitting over  downstairs toilet but states he will be upstairs to start out with . Discussed how he can use 3in1 as a BSC downstairs if needed and doesnt fit over toilet. He will sponge bathe initially. Pt with 7/10 pain but able to perform toilet transfer  well.       Vision                     Perception     Praxis      Cognition   Behavior During Therapy: [facility] for tasks assessed/performed Overall Cognitive Status: Within Functional Limits for tasks assessed                       Extremity/Trunk Assessment               Exercises Total Joint Exercises Ankle Circles/Pumps: AROM;Both;15 reps;Seated Quad Sets: AROM;Both;10 reps;Seated Heel Slides: AAROM;Left;10 reps;Seated Hip ABduction/ADduction: AROM;Left;10 reps;Seated Straight Leg Raises: AROM;AAROM;Left;10 reps;Seated Goniometric ROM: 10-40 degrees (decreased ROM due to pain most likely)   Shoulder Instructions       General Comments      Pertinent Vitals/ Pain       Pain Assessment: 0-10 Pain Score: 7  Pain Location: L knee Pain Descriptors / Indicators: Aching Pain Intervention(s): Patient requesting pain meds-RN notified;Repositioned;Ice applied (nursing in room)  Home Living  Prior Functioning/Environment              Frequency Min 2X/week     Progress Toward Goals  OT Goals(current goals can now be found in the care plan section)  Progress towards OT goals: Progressing toward goals     Plan Discharge plan remains appropriate    Co-evaluation                 End of Session Equipment Utilized During Treatment: Gait belt;Rolling walker;Left knee immobilizer   Activity Tolerance Patient limited by pain   Patient Left in chair;with call bell/phone within reach   Nurse Communication          Time: 1024-1100 OT Time Calculation (min): 36 min  Charges: OT General Charges $OT Visit: 1 Procedure OT Treatments $Self Care/Home Management : 8-22 mins $Therapeutic Activity: 8-22 mins  Jules Schick  [phone] 12/16/2013, 11:53 AM

## 2013-12-16 NOTE — Plan of Care (Signed)
Problem: Phase I Progression Outcomes Goal: Initial discharge plan identified Outcome: Completed/Met Date Met:  12/16/13     

## 2013-12-16 NOTE — Progress Notes (Signed)
RN reviewed discharge instructions with patient and spouse. All questions answered.   Paperwork and prescriptions given.   NT rolled patient down in wheelchair to family car.

## 2013-12-16 NOTE — Progress Notes (Signed)
Physical Therapy Treatment Patient Details Name: Ricky [name] MRN: [id number] DOB: 19-May-1945 Today's Date: 12/16/2013    History of Present Illness 68 yo male s/p L TKA.     PT Comments    Pt continues to perform well. Pt rates pain as 7/10 with activity. Practiced steps with use of crutch to simplify things (pt will use 1 crutch for home entry steps (wife providing 1 HHA)  and steps to 2nd level (1 handrail available for use). Wife present to observe and practice with pt. Also issued handout for pt/wife to read over. Recommended to wife and pt that they consider having pt stay on 1st level for one night if they feel uncomfortable tackling flight of steps to bedroom. Reviewed car transfer. All education completed. Ready for d/c from PT standpoint.    Follow Up Recommendations  Home health PT     Equipment Recommendations  Rolling walker with 5" wheels    Recommendations for Other Services OT consult     Precautions / Restrictions Precautions Precautions: Knee;Fall Required Braces or Orthoses: Knee Immobilizer - Left Knee Immobilizer - Left: Discontinue once straight leg raise with < 10 degree lag Restrictions Weight Bearing Restrictions: No LLE Weight Bearing: Weight bearing as tolerated    Mobility   Transfers Overall transfer level: Needs assistance Equipment used: Rolling walker (2 wheeled) Transfers: Sit to/from Stand Sit to Stand: Min guard         General transfer comment: verbal cues for hand placement and LE management.  Ambulation/Gait Ambulation/Gait assistance: Min guard Ambulation Distance (Feet): 100 Feet Assistive device: Rolling walker (2 wheeled) Gait Pattern/deviations: Step-to pattern;Decreased stride length;Antalgic;Trunk flexed     General Gait Details: Gait slightly more antalgic today. close guard for safety. Pt reports increased pain today as well.    Stairs Stairs: Yes     Number of Stairs: 4 (x2) General stair comments:  Practiced x 1 with 1 crutch, 1 HHA (to simulate not having a rail to hold onto for steps to enter home); x1 with 1 crutch, 1 rail (to simulate steps up to bedroom). Min assist to steady. Wife observed and practiced with pt as well.   Wheelchair Mobility    Modified Rankin (Stroke Patients Only)       Balance                                    Cognition Arousal/Alertness: Awake/alert Behavior During Therapy: WFL for tasks assessed/performed Overall Cognitive Status: Within Functional Limits for tasks assessed                      Exercises Total Joint Exercises Ankle Circles/Pumps: AROM;Both;15 reps;Seated Quad Sets: AROM;Both;10 reps;Seated Heel Slides: AAROM;Left;10 reps;Seated Hip ABduction/ADduction: AROM;Left;10 reps;Seated Straight Leg Raises: AROM;AAROM;Left;10 reps;Seated Goniometric ROM: 10-40 degrees (decreased ROM due to pain most likely)    General Comments        Pertinent Vitals/Pain Pain Assessment: 0-10 Pain Score: 6  Pain Location: L knee Pain Descriptors / Indicators: Aching Pain Intervention(s): Monitored during session    Home Living                      Prior Function            PT Goals (current goals can now be found in the care plan section) Progress towards PT goals: Progressing toward goals    Frequency  7X/week  PT Plan Current plan remains appropriate    Co-evaluation             End of Session Equipment Utilized During Treatment: Gait belt;Left knee immobilizer Activity Tolerance: Patient tolerated treatment well Patient left: in chair;with call bell/phone within reach;with family/visitor present     Time: 1320-1405 PT Time Calculation (min) (ACUTE ONLY): 45 min  Charges:  $Gait Training: 23-37 mins $Therapeutic Exercise: 8-22 mins $Therapeutic Activity: 8-22 mins $Self Care/Home Management: 8-22                    G Codes:      Weston Anna, MPT Pager: [phone]

## 2013-12-16 NOTE — Plan of Care (Signed)
Problem: Phase II Progression Outcomes Goal: Tolerating diet Outcome: Completed/Met Date Met:  12/16/13

## 2013-12-16 NOTE — Plan of Care (Signed)
Problem: Phase I Progression Outcomes Goal: Pain controlled with appropriate interventions Outcome: Completed/Met Date Met:  12/16/13     

## 2013-12-16 NOTE — Plan of Care (Signed)
Problem: Phase II Progression Outcomes Goal: Discharge plan established Outcome: Completed/Met Date Met:  12/16/13

## 2013-12-16 NOTE — Progress Notes (Signed)
[facility]  [facility] is providing the following services: RW and Commode  If patient discharges after hours, please call [phone].   Linward Headland 12/16/2013, 11:00 AM

## 2013-12-17 DIAGNOSIS — Z96652 Presence of left artificial knee joint: Secondary | ICD-10-CM | POA: Diagnosis not present

## 2013-12-17 DIAGNOSIS — M5431 Sciatica, right side: Secondary | ICD-10-CM | POA: Diagnosis not present

## 2013-12-17 DIAGNOSIS — Z471 Aftercare following joint replacement surgery: Secondary | ICD-10-CM | POA: Diagnosis not present

## 2013-12-17 DIAGNOSIS — I10 Essential (primary) hypertension: Secondary | ICD-10-CM | POA: Diagnosis not present

## 2013-12-21 DIAGNOSIS — Z96652 Presence of left artificial knee joint: Secondary | ICD-10-CM | POA: Diagnosis not present

## 2013-12-21 DIAGNOSIS — I10 Essential (primary) hypertension: Secondary | ICD-10-CM | POA: Diagnosis not present

## 2013-12-21 DIAGNOSIS — Z471 Aftercare following joint replacement surgery: Secondary | ICD-10-CM | POA: Diagnosis not present

## 2013-12-21 DIAGNOSIS — M5431 Sciatica, right side: Secondary | ICD-10-CM | POA: Diagnosis not present

## 2013-12-23 DIAGNOSIS — I10 Essential (primary) hypertension: Secondary | ICD-10-CM | POA: Diagnosis not present

## 2013-12-23 DIAGNOSIS — M5431 Sciatica, right side: Secondary | ICD-10-CM | POA: Diagnosis not present

## 2013-12-23 DIAGNOSIS — Z96652 Presence of left artificial knee joint: Secondary | ICD-10-CM | POA: Diagnosis not present

## 2013-12-23 DIAGNOSIS — Z471 Aftercare following joint replacement surgery: Secondary | ICD-10-CM | POA: Diagnosis not present

## 2013-12-25 DIAGNOSIS — I10 Essential (primary) hypertension: Secondary | ICD-10-CM | POA: Diagnosis not present

## 2013-12-25 DIAGNOSIS — M5431 Sciatica, right side: Secondary | ICD-10-CM | POA: Diagnosis not present

## 2013-12-25 DIAGNOSIS — Z471 Aftercare following joint replacement surgery: Secondary | ICD-10-CM | POA: Diagnosis not present

## 2013-12-25 DIAGNOSIS — Z96652 Presence of left artificial knee joint: Secondary | ICD-10-CM | POA: Diagnosis not present

## 2013-12-28 DIAGNOSIS — I10 Essential (primary) hypertension: Secondary | ICD-10-CM | POA: Diagnosis not present

## 2013-12-28 DIAGNOSIS — Z471 Aftercare following joint replacement surgery: Secondary | ICD-10-CM | POA: Diagnosis not present

## 2013-12-28 DIAGNOSIS — M5431 Sciatica, right side: Secondary | ICD-10-CM | POA: Diagnosis not present

## 2013-12-28 DIAGNOSIS — Z96652 Presence of left artificial knee joint: Secondary | ICD-10-CM | POA: Diagnosis not present

## 2013-12-30 DIAGNOSIS — Z471 Aftercare following joint replacement surgery: Secondary | ICD-10-CM | POA: Diagnosis not present

## 2013-12-30 DIAGNOSIS — M5431 Sciatica, right side: Secondary | ICD-10-CM | POA: Diagnosis not present

## 2013-12-30 DIAGNOSIS — Z96652 Presence of left artificial knee joint: Secondary | ICD-10-CM | POA: Diagnosis not present

## 2013-12-30 DIAGNOSIS — I10 Essential (primary) hypertension: Secondary | ICD-10-CM | POA: Diagnosis not present

## 2014-01-01 DIAGNOSIS — Z471 Aftercare following joint replacement surgery: Secondary | ICD-10-CM | POA: Diagnosis not present

## 2014-01-01 DIAGNOSIS — I10 Essential (primary) hypertension: Secondary | ICD-10-CM | POA: Diagnosis not present

## 2014-01-01 DIAGNOSIS — Z96652 Presence of left artificial knee joint: Secondary | ICD-10-CM | POA: Diagnosis not present

## 2014-01-01 DIAGNOSIS — M5431 Sciatica, right side: Secondary | ICD-10-CM | POA: Diagnosis not present

## 2014-01-04 DIAGNOSIS — M1712 Unilateral primary osteoarthritis, left knee: Secondary | ICD-10-CM | POA: Diagnosis not present

## 2014-01-06 DIAGNOSIS — M1712 Unilateral primary osteoarthritis, left knee: Secondary | ICD-10-CM | POA: Diagnosis not present

## 2014-01-11 DIAGNOSIS — M1712 Unilateral primary osteoarthritis, left knee: Secondary | ICD-10-CM | POA: Diagnosis not present

## 2014-01-13 DIAGNOSIS — M1712 Unilateral primary osteoarthritis, left knee: Secondary | ICD-10-CM | POA: Diagnosis not present

## 2014-01-18 DIAGNOSIS — M1712 Unilateral primary osteoarthritis, left knee: Secondary | ICD-10-CM | POA: Diagnosis not present

## 2014-01-21 DIAGNOSIS — Z96652 Presence of left artificial knee joint: Secondary | ICD-10-CM | POA: Diagnosis not present

## 2014-01-21 DIAGNOSIS — Z471 Aftercare following joint replacement surgery: Secondary | ICD-10-CM | POA: Diagnosis not present

## 2014-01-21 DIAGNOSIS — M1712 Unilateral primary osteoarthritis, left knee: Secondary | ICD-10-CM | POA: Diagnosis not present

## 2014-01-25 DIAGNOSIS — M1712 Unilateral primary osteoarthritis, left knee: Secondary | ICD-10-CM | POA: Diagnosis not present

## 2014-01-27 DIAGNOSIS — M1712 Unilateral primary osteoarthritis, left knee: Secondary | ICD-10-CM | POA: Diagnosis not present

## 2014-02-09 DIAGNOSIS — M1712 Unilateral primary osteoarthritis, left knee: Secondary | ICD-10-CM | POA: Diagnosis not present

## 2014-02-11 DIAGNOSIS — M1712 Unilateral primary osteoarthritis, left knee: Secondary | ICD-10-CM | POA: Diagnosis not present

## 2014-02-16 DIAGNOSIS — M1712 Unilateral primary osteoarthritis, left knee: Secondary | ICD-10-CM | POA: Diagnosis not present

## 2014-02-18 DIAGNOSIS — M1712 Unilateral primary osteoarthritis, left knee: Secondary | ICD-10-CM | POA: Diagnosis not present

## 2014-02-23 DIAGNOSIS — Z471 Aftercare following joint replacement surgery: Secondary | ICD-10-CM | POA: Diagnosis not present

## 2014-02-23 DIAGNOSIS — M1712 Unilateral primary osteoarthritis, left knee: Secondary | ICD-10-CM | POA: Diagnosis not present

## 2014-02-23 DIAGNOSIS — Z96652 Presence of left artificial knee joint: Secondary | ICD-10-CM | POA: Diagnosis not present

## 2014-06-01 ENCOUNTER — Ambulatory Visit: Payer: Medicare Other | Admitting: Family

## 2014-06-17 ENCOUNTER — Telehealth: Payer: Self-pay | Admitting: Family

## 2014-06-17 MED ORDER — ATENOLOL 50 MG PO TABS: 50.0000 mg | ORAL_TABLET | ORAL | Status: DC

## 2014-06-17 NOTE — Telephone Encounter (Signed)
Done. Pt needs f/u

## 2014-06-17 NOTE — Telephone Encounter (Signed)
Pt needs a refill on his Atenolol sent to [facility] on [address] please.

## 2014-08-18 DIAGNOSIS — Z96652 Presence of left artificial knee joint: Secondary | ICD-10-CM | POA: Diagnosis not present

## 2014-08-18 DIAGNOSIS — Z471 Aftercare following joint replacement surgery: Secondary | ICD-10-CM | POA: Diagnosis not present

## 2014-10-04 ENCOUNTER — Telehealth: Payer: Self-pay | Admitting: Family Medicine

## 2014-10-04 MED ORDER — ATENOLOL 50 MG PO TABS
50.0000 mg | ORAL_TABLET | ORAL | Status: DC
Start: 2014-10-04 — End: 2014-12-20

## 2014-10-04 NOTE — Telephone Encounter (Signed)
Pt states he needs at least enough refills to make it to the middle of November when he has an appt to see Dr. Yong Channel.  Medication is Atenolol 50 mg tablet, one every morning.  [facility] in [place].

## 2014-12-16 DIAGNOSIS — Z471 Aftercare following joint replacement surgery: Secondary | ICD-10-CM | POA: Diagnosis not present

## 2014-12-16 DIAGNOSIS — Z96652 Presence of left artificial knee joint: Secondary | ICD-10-CM | POA: Diagnosis not present

## 2014-12-20 ENCOUNTER — Ambulatory Visit (INDEPENDENT_AMBULATORY_CARE_PROVIDER_SITE_OTHER): Payer: Medicare Other | Admitting: Family Medicine

## 2014-12-20 ENCOUNTER — Encounter: Payer: Self-pay | Admitting: Family Medicine

## 2014-12-20 ENCOUNTER — Telehealth: Payer: Self-pay | Admitting: Family Medicine

## 2014-12-20 ENCOUNTER — Other Ambulatory Visit: Payer: Self-pay | Admitting: Family Medicine

## 2014-12-20 DIAGNOSIS — M1712 Unilateral primary osteoarthritis, left knee: Secondary | ICD-10-CM

## 2014-12-20 DIAGNOSIS — I10 Essential (primary) hypertension: Secondary | ICD-10-CM | POA: Insufficient documentation

## 2014-12-20 DIAGNOSIS — Z20828 Contact with and (suspected) exposure to other viral communicable diseases: Secondary | ICD-10-CM | POA: Diagnosis not present

## 2014-12-20 DIAGNOSIS — Z1211 Encounter for screening for malignant neoplasm of colon: Secondary | ICD-10-CM | POA: Diagnosis not present

## 2014-12-20 DIAGNOSIS — Z23 Encounter for immunization: Secondary | ICD-10-CM

## 2014-12-20 LAB — LIPID PANEL
CHOL/HDL RATIO: 2
CHOLESTEROL: 136 mg/dL (ref 0–200)
HDL: 60.2 mg/dL (ref >39.00)
LDL Cholesterol: 69 mg/dL (ref 0–99)
NonHDL: 76.05
Triglycerides: 37 mg/dL (ref 0.0–149.0)
VLDL: 7.4 mg/dL (ref 0.0–40.0)

## 2014-12-20 LAB — COMPREHENSIVE METABOLIC PANEL
ALBUMIN: 4 g/dL (ref 3.5–5.2)
ALK PHOS: 48 U/L (ref 39–117)
ALT: 21 U/L (ref 0–53)
AST: 26 U/L (ref 0–37)
BILIRUBIN TOTAL: 0.7 mg/dL (ref 0.2–1.2)
BUN: 20 mg/dL (ref 6–23)
CALCIUM: 9.6 mg/dL (ref 8.4–10.5)
CO2: 30 mEq/L (ref 19–32)
CREATININE: 0.8 mg/dL (ref 0.40–1.50)
Chloride: 106 mEq/L (ref 96–112)
GFR: 101.27 mL/min (ref >60.00)
Glucose, Bld: 105 mg/dL — ABNORMAL HIGH (ref 70–99)
Potassium: 4.7 mEq/L (ref 3.5–5.1)
Sodium: 142 mEq/L (ref 135–145)
TOTAL PROTEIN: 6.2 g/dL (ref 6.0–8.3)

## 2014-12-20 LAB — CBC
HCT: 41.4 % (ref 39.0–52.0)
HEMOGLOBIN: 13.8 g/dL (ref 13.0–17.0)
MCHC: 33.2 g/dL (ref 30.0–36.0)
MCV: 95.7 fl (ref 78.0–100.0)
PLATELETS: 225 10*3/uL (ref 150.0–400.0)
RBC: 4.33 Mil/uL (ref 4.22–5.81)
RDW: 14 % (ref 11.5–15.5)
WBC: 3.4 10*3/uL — ABNORMAL LOW (ref 4.0–10.5)

## 2014-12-20 MED ORDER — ATENOLOL 50 MG PO TABS: 50.0000 mg | ORAL_TABLET | ORAL | Status: DC

## 2014-12-20 NOTE — Telephone Encounter (Signed)
Pt had his tetanus in spring [place] 2015[place]. Pt was to let dr hunter know.

## 2014-12-20 NOTE — Assessment & Plan Note (Signed)
S: controlled. On atenolol 50mg . Excellent exercise routine.  BP Readings from Last 3 Encounters:  12/20/14 124/80  12/16/13 150/66  12/07/13 177/79  A/P:Continue current meds:  Refilled x 1 year

## 2014-12-20 NOTE — Progress Notes (Signed)
Garret Reddish, MD  Subjective:  Ricky [name]. is a 69 y.o. year old very pleasant male patient who presents for/with See problem oriented charting ROS- No chest pain or shortness of breath. No headache or blurry vision.   Past Medical History-  Patient Active Problem List   Diagnosis Date Noted  . Essential hypertension 12/20/2014    Priority: Medium  . OA (osteoarthritis) of knee 12/14/2013    Priority: Low   Medications- reviewed and updated Current Outpatient Prescriptions  Medication Sig Dispense Refill  . atenolol (TENORMIN) 50 MG tablet Take 1 tablet (50 mg total) by mouth every morning. 90 tablet 3   No current facility-administered medications for this visit.    Objective: BP 124/80 mmHg  Pulse 52  Temp(Src) 97.6 F (36.4 C)  Wt 144 lb (65.318 kg) Gen: NAD, resting comfortably CV: RRR no murmurs rubs or gallops Lungs: CTAB no crackles, wheeze, rhonchi Abdomen: soft/nontender/nondistended/normal bowel sounds. No rebound or guarding.  Ext: no edema Skin: warm, dry Neuro: grossly normal, moves all extremities  Assessment/Plan:  Essential hypertension S: controlled. On atenolol 50mg . Excellent exercise routine.  BP Readings from Last 3 Encounters:  12/20/14 124/80  12/16/13 150/66  12/07/13 177/79  A/P:Continue current meds:  Refilled x 1 year   Return precautions advised.   Orders Placed This Encounter  Procedures  . Flu Vaccine QUAD 36+ mos IM  . Pneumococcal polysaccharide vaccine 23-valent greater than or equal to 2yo subcutaneous/IM  . POC Hemoccult Bld/Stl (3-Cd Home Screen)    Send home    Standing Status: Future     Number of Occurrences: 1     Standing Expiration Date: 12/20/2015    Meds ordered this encounter  Medications  . atenolol (TENORMIN) 50 MG tablet    Sig: Take 1 tablet (50 mg total) by mouth every morning.    Dispense:  90 tablet    Refill:  3   Health Maintenance Due  Topic Date Due  . Hepatitis C Screening - today  Jan 21, 1946  . TETANUS/TDAP - thinks he had it 2-3 years ago at [facility] 11/28/1962  . COLONOSCOPY - opts for stool cards instead 11/27/1993  . INFLUENZA VACCINE - today, high dose 09/06/2014  . PNA vac Low Risk Adult (2 of 2 - PPSV23) - pneumovax 23 as welll 12/01/2014   Immunization History  Administered Date(s) Administered  . Influenza,inj,Quad PF,36+ Mos 11/30/2013, 12/20/2014  . Pneumococcal Conjugate-13 11/30/2013  . Pneumococcal Polysaccharide-23 12/20/2014  . Td 04/20/2014   >50% of 25 minute office visit was spent on counseling (continued healthy habits including regular exercise, benefits risks of colonoscopy and patient opts for stool cards instead, reasons for immunizations and screenign test including hep c) and coordination of care  Results for orders placed or performed in visit on 12/20/14 (from the past 24 hour(s))  CBC     Status: Abnormal   Collection Time: 12/20/14  8:32 AM  Result Value Ref Range   WBC 3.4 (L) 4.0 - 10.5 K/uL   RBC 4.33 4.22 - 5.81 Mil/uL   Platelets 225.0 150.0 - 400.0 K/uL   Hemoglobin 13.8 13.0 - 17.0 g/dL   HCT 41.4 39.0 - 52.0 %   MCV 95.7 78.0 - 100.0 fl   MCHC 33.2 30.0 - 36.0 g/dL   RDW 14.0 11.5 - 15.5 %  Comprehensive metabolic panel     Status: Abnormal   Collection Time: 12/20/14  8:32 AM  Result Value Ref Range   Sodium 142 135 -  145 mEq/L   Potassium 4.7 3.5 - 5.1 mEq/L   Chloride 106 96 - 112 mEq/L   CO2 30 19 - 32 mEq/L   Glucose, Bld 105 (H) 70 - 99 mg/dL   BUN 20 6 - 23 mg/dL   Creatinine, Ser 0.80 0.40 - 1.50 mg/dL   Total Bilirubin 0.7 0.2 - 1.2 mg/dL   Alkaline Phosphatase 48 39 - 117 U/L   AST 26 0 - 37 U/L   ALT 21 0 - 53 U/L   Total Protein 6.2 6.0 - 8.3 g/dL   Albumin 4.0 3.5 - 5.2 g/dL   Calcium 9.6 8.4 - 10.5 mg/dL   GFR 101.27 >60.00 mL/min  Lipid panel     Status: None   Collection Time: 12/20/14  8:32 AM  Result Value Ref Range   Cholesterol 136 0 - 200 mg/dL   Triglycerides 37.0 0.0 - 149.0 mg/dL   HDL  60.20 >39.00 mg/dL   VLDL 7.4 0.0 - 40.0 mg/dL   LDL Cholesterol 69 0 - 99 mg/dL   Total CHOL/HDL Ratio 2    NonHDL 76.05   My notes: Increased risk for diabetes with glucose/sugar between 100-125 at 105. Rest of kidney, liver, electrolytes normal.  Cholesterol looks fantastic!  No anemia or clotting issues. Infection fighting cells were hair low- keep an eye on this with repeat with next bloodwork.

## 2014-12-20 NOTE — Telephone Encounter (Signed)
FYI: Tetanus updated in HM

## 2014-12-20 NOTE — Patient Instructions (Addendum)
Flu shot received today. Final pneumonia shot as well.   Refilled atenolol for 1 year  See you in 1 year- sign up for annual wellness visit at that time  Labs before you go  Return stool cards after collection- ask lab for instructions

## 2014-12-21 LAB — HEPATITIS C ANTIBODY: HCV AB: NEGATIVE

## 2016-01-10 ENCOUNTER — Ambulatory Visit (INDEPENDENT_AMBULATORY_CARE_PROVIDER_SITE_OTHER): Payer: Medicare Other | Admitting: Family Medicine

## 2016-01-10 ENCOUNTER — Encounter: Payer: Self-pay | Admitting: Family Medicine

## 2016-01-10 VITALS — BP 124/82 | HR 49 | Temp 97.5°F | Ht 64.75 in | Wt 144.0 lb

## 2016-01-10 DIAGNOSIS — I10 Essential (primary) hypertension: Secondary | ICD-10-CM | POA: Diagnosis not present

## 2016-01-10 DIAGNOSIS — R21 Rash and other nonspecific skin eruption: Secondary | ICD-10-CM

## 2016-01-10 DIAGNOSIS — Z23 Encounter for immunization: Secondary | ICD-10-CM | POA: Diagnosis not present

## 2016-01-10 DIAGNOSIS — M6530 Trigger finger, unspecified finger: Secondary | ICD-10-CM

## 2016-01-10 DIAGNOSIS — R6889 Other general symptoms and signs: Secondary | ICD-10-CM

## 2016-01-10 DIAGNOSIS — Z0000 Encounter for general adult medical examination without abnormal findings: Secondary | ICD-10-CM | POA: Diagnosis not present

## 2016-01-10 LAB — COMPREHENSIVE METABOLIC PANEL
ALT: 20 U/L (ref 0–53)
AST: 28 U/L (ref 0–37)
Albumin: 4.2 g/dL (ref 3.5–5.2)
Alkaline Phosphatase: 45 U/L (ref 39–117)
BUN: 18 mg/dL (ref 6–23)
CALCIUM: 9.3 mg/dL (ref 8.4–10.5)
CHLORIDE: 105 meq/L (ref 96–112)
CO2: 29 meq/L (ref 19–32)
Creatinine, Ser: 0.8 mg/dL (ref 0.40–1.50)
GFR: 100.96 mL/min (ref >60.00)
Glucose, Bld: 98 mg/dL (ref 70–99)
POTASSIUM: 4.5 meq/L (ref 3.5–5.1)
Sodium: 142 mEq/L (ref 135–145)
Total Bilirubin: 0.9 mg/dL (ref 0.2–1.2)
Total Protein: 6.8 g/dL (ref 6.0–8.3)

## 2016-01-10 LAB — CBC
HEMATOCRIT: 42.1 % (ref 39.0–52.0)
Hemoglobin: 14.1 g/dL (ref 13.0–17.0)
MCHC: 33.5 g/dL (ref 30.0–36.0)
MCV: 95 fl (ref 78.0–100.0)
PLATELETS: 228 10*3/uL (ref 150.0–400.0)
RBC: 4.44 Mil/uL (ref 4.22–5.81)
RDW: 14.1 % (ref 11.5–15.5)
WBC: 3.2 10*3/uL — ABNORMAL LOW (ref 4.0–10.5)

## 2016-01-10 LAB — TSH: TSH: 1.64 u[IU]/mL (ref 0.35–4.50)

## 2016-01-10 LAB — LIPID PANEL
CHOL/HDL RATIO: 2
CHOLESTEROL: 154 mg/dL (ref 0–200)
HDL: 70.8 mg/dL (ref >39.00)
LDL CALC: 75 mg/dL (ref 0–99)
NonHDL: 83.36
TRIGLYCERIDES: 44 mg/dL (ref 0.0–149.0)
VLDL: 8.8 mg/dL (ref 0.0–40.0)

## 2016-01-10 MED ORDER — ATENOLOL 25 MG PO TABS: 25.0000 mg | ORAL_TABLET | Freq: Every day | ORAL | 3 refills | Status: DC

## 2016-01-10 MED ORDER — KETOCONAZOLE 2 % EX CREA: 1.0000 "application " | TOPICAL_CREAM | Freq: Every day | CUTANEOUS | 0 refills | Status: DC

## 2016-01-10 NOTE — Patient Instructions (Addendum)
  [name] , Thank you for taking time to come for your Medicare Wellness Visit. I appreciate your ongoing commitment to your health goals. Please review the following plan we discussed and let me know if I can assist you in the future.   These are the goals we discussed: 1. Reduce atenolol to 25mg - see me back in 3 months for recheck or sooner if you note above 1435/85 regularly. Hopeful to get HR at least always in the 50s and not 40s.  2. Labs before you leave- checking thyroid for cold sensitivity.  3. If trigger finger gets worse we could try 7 days of meloxicam- an antiinflammatory- you can let me know if you opt for this 4. Trial ketoconazole for possible ringworm- can refer to dermatology if not improving within 2 weeks  This is a list of the screening recommended for you and due dates:  Health Maintenance  Topic Date Due  . Colon Cancer Screening  11/27/1993  . Shingles Vaccine  11/28/2003  . Tetanus Vaccine  04/19/2024  . Flu Shot  Completed  .  Hepatitis C: One time screening is recommended by Center for Disease Control  (CDC) for  adults born from [birth year] through 1965.   Completed  . Pneumonia vaccines  Completed

## 2016-01-10 NOTE — Progress Notes (Signed)
Pre visit review using our clinic review tool, if applicable. No additional management support is needed unless otherwise documented below in the visit note. 

## 2016-01-10 NOTE — Progress Notes (Signed)
Phone: [phone]  Subjective:  Patient presents today for their annual wellness visit.    Preventive Screening-Counseling & Management  Smoking Status: Never Smoker Second Hand Smoking status: No smokers in home  Risk Factors Regular exercise: 5 days a week- 1-2.5 hours. Up to an hour of biking. Crunches. 45-mins to an hour of weights Diet: very balanced, weight maintained  Fall Risk: None  Fall Risk  01/10/2016 12/20/2014 11/30/2013  Falls in the past year? No No No    Cardiac risk factors:  advanced age (older than 47 for men, 60 for women)  Hypertension but controlled Hyperlipidemia none No diabetes.  Family History: father was in 3s - possible CAD   Depression Screen None. PHQ2 0  Depression screen [facility] 2/9 01/10/2016 12/20/2014 11/30/2013  Decreased Interest 0 0 0  Down, Depressed, Hopeless 0 0 0  PHQ - 2 Score 0 0 0    Activities of Daily Living Independent ADLs and IADLs   Hearing Difficulties: -patient declines  Cognitive Testing No reported trouble.   Normal 3 word recall  List the Names of Other Physician/Practitioners you currently use: -Goes to [place] [facility]. -Dr. Maureen Ralphs ortho for knee in 2015  Immunization History  Administered Date(s) Administered  . Influenza, High Dose Seasonal PF 01/10/2016  . Influenza,inj,Quad PF,36+ Mos 11/30/2013, 12/20/2014  . Pneumococcal Conjugate-13 11/30/2013  . Pneumococcal Polysaccharide-23 12/20/2014  . Td 04/20/2014   Required Immunizations needed today de  Screening tests- up to date Health Maintenance Due  Topic Date Due  . COLONOSCOPY - will do cologuard instead 11/27/1993  . ZOSTAVAX - discussed deferring to next year 11/28/2003   ROS- No pertinent positives discovered in course of AWV ROS- No chest pain or shortness of breath. No headache or blurry vision.  One episode of dizziness and BP under 100 at time.   The following were reviewed and entered/updated in epic: Past Medical History:   Diagnosis Date  . Arthritis   . Hypertension    Patient Active Problem List   Diagnosis Date Noted  . Essential hypertension 12/20/2014    Priority: Medium  . OA (osteoarthritis) of knee 12/14/2013    Priority: Low   Past Surgical History:  Procedure Laterality Date  . SHOULDER SURGERY     left- injections intermittent now  . TONSILLECTOMY    . TOTAL KNEE ARTHROPLASTY Left 12/14/2013   Procedure: LEFT TOTAL KNEE ARTHROPLASTY;  Surgeon: Gearlean Alf, MD;  Location: [facility];  Service: Orthopedics;  Laterality: Left;    Family History  Problem Relation Age of Onset  . Heart disease Father     in 22s- specifics unclear  . Other Mother     passed in late 42s    Medications- reviewed and updated Current Outpatient Prescriptions  Medication Sig Dispense Refill  . atenolol (TENORMIN) 50 MG tablet Take 1 tablet (50 mg total) by mouth every morning. 90 tablet 3  . Multiple Vitamin (MULTIVITAMIN) tablet Take 1 tablet by mouth daily.     No current facility-administered medications for this visit.     Allergies-reviewed and updated Allergies  Allergen Reactions  . Amoxicillin Rash    Social History   Social History  . Marital status: Married    Spouse name: N/A  . Number of children: N/A  . Years of education: N/A   Social History Main Topics  . Smoking status: Never Smoker  . Smokeless tobacco: None  . Alcohol use No  . Drug use: No  . Sexual activity: Yes  Other Topics Concern  . None   Social History Narrative   Married. 1 son. 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       Hobbies: gym daily- riding exercise bike plus weights/machines, photography- used to do a lot of black and white, gardening    Objective: BP 124/82 (BP Location: Left Arm, Patient Position: Sitting, Cuff Size: Normal)   Pulse (!) 49   Temp 97.5 F (36.4 C) (Oral)   Ht 5' 4.75" (1.645 m)   Wt 144 lb (65.3 kg)   SpO2  95%   BMI 24.15 kg/m  Gen: NAD, resting comfortably HEENT: Mucous membranes are moist. Oropharynx normal Neck: no thyromegaly CV: RRR no murmurs rubs or gallops Lungs: CTAB no crackles, wheeze, rhonchi Abdomen: soft/nontender/nondistended/normal bowel sounds. No rebound or guarding.  Ext: no edema Skin: warm, dry Neuro: grossly normal, moves all extremities, PERRLA  Assessment/Plan:  AWV completed- discussed recommended screenings anddocumented any personalized health advice and referrals for preventive counseling. See AVS as well which was given to patient.   Status of chronic or acute concerns   Opts out of prostate cancer screening  Opts in cologuard, declines colonoscopy  Rash right upper arm S: noted within last few weeks, does not think growing, fine scale on it O: small largely circular lesion with raised borders A/P: discussed trial of ketoconazole once a day for 2 weeks, refer to drematology if does not improve  Left 3rd finger trigger finger S: has noted some catching feeling in left hand 3rd finger at times. Mild annoyance- no severe pain O: small nodule felt in 3rd finger on left hand- only able to get it to cause trigger sensation once  A/P: offered nsaid trial given no CAD/CVA and HTN only- declines for now   Essential hypertension S: controlled on atenolol 50mg  but HR under 50. And cannot get HR above 100 with exercise BP Readings from Last 3 Encounters:  01/10/16 124/82  12/20/14 124/80  12/16/13 (!) 150/66  A/P:Atenolol 50mg --> trial 25mg  with HR in high 40s at times. 3 month recheck  3 months  Orders Placed This Encounter  Procedures  . Flu vaccine HIGH DOSE PF  . CBC    [place]  . Comprehensive metabolic panel    [place]    Order Specific Question:   Has the patient fasted?    Answer:   No  . Lipid panel    [place]    Order Specific Question:   Has the patient fasted?    Answer:   No  . TSH    [place]    Meds ordered this encounter    Medications  . Multiple Vitamin (MULTIVITAMIN) tablet    Sig: Take 1 tablet by mouth daily.  Marland Kitchen ketoconazole (NIZORAL) 2 % cream    Sig: Apply 1 application topically daily. For at least 2 weeks    Dispense:  30 g    Refill:  0  . atenolol (TENORMIN) 25 MG tablet    Sig: Take 1 tablet (25 mg total) by mouth daily.    Dispense:  90 tablet    Refill:  3    Return precautions advised.  Garret Reddish, MD

## 2016-01-10 NOTE — Assessment & Plan Note (Signed)
S: controlled on atenolol 50mg  but HR under 50. And cannot get HR above 100 with exercise BP Readings from Last 3 Encounters:  01/10/16 124/82  12/20/14 124/80  12/16/13 (!) 150/66  A/P:Atenolol 50mg --> trial 25mg  with HR in high 40s at times. 3 month recheck

## 2016-01-19 DIAGNOSIS — Z1211 Encounter for screening for malignant neoplasm of colon: Secondary | ICD-10-CM | POA: Diagnosis not present

## 2016-01-19 DIAGNOSIS — Z1212 Encounter for screening for malignant neoplasm of rectum: Secondary | ICD-10-CM | POA: Diagnosis not present

## 2016-01-19 LAB — COLOGUARD: Cologuard: NEGATIVE

## 2016-01-25 ENCOUNTER — Other Ambulatory Visit: Payer: Self-pay | Admitting: Family Medicine

## 2016-02-01 ENCOUNTER — Encounter: Payer: Self-pay | Admitting: Family Medicine

## 2016-02-28 ENCOUNTER — Telehealth: Payer: Self-pay | Admitting: Family Medicine

## 2016-02-28 ENCOUNTER — Observation Stay (HOSPITAL_COMMUNITY): Payer: Medicare Other

## 2016-02-28 ENCOUNTER — Ambulatory Visit (INDEPENDENT_AMBULATORY_CARE_PROVIDER_SITE_OTHER): Payer: Medicare Other | Admitting: Family Medicine

## 2016-02-28 ENCOUNTER — Inpatient Hospital Stay (HOSPITAL_COMMUNITY)
Admission: EM | Admit: 2016-02-28 | Discharge: 2016-03-01 | DRG: 066 | Disposition: A | Discharge status: Home / Self Care | Payer: Medicare Other | Attending: Internal Medicine | Admitting: Internal Medicine

## 2016-02-28 ENCOUNTER — Encounter (HOSPITAL_COMMUNITY): Payer: Self-pay | Admitting: Emergency Medicine

## 2016-02-28 ENCOUNTER — Emergency Department (HOSPITAL_COMMUNITY): Payer: Medicare Other

## 2016-02-28 ENCOUNTER — Encounter: Payer: Self-pay | Admitting: Family Medicine

## 2016-02-28 VITALS — BP 150/94 | HR 70 | Temp 98.3°F | Ht 64.75 in | Wt 143.0 lb

## 2016-02-28 DIAGNOSIS — I6529 Occlusion and stenosis of unspecified carotid artery: Secondary | ICD-10-CM | POA: Diagnosis present

## 2016-02-28 DIAGNOSIS — I4891 Unspecified atrial fibrillation: Secondary | ICD-10-CM | POA: Diagnosis present

## 2016-02-28 DIAGNOSIS — R29701 NIHSS score 1: Secondary | ICD-10-CM | POA: Diagnosis present

## 2016-02-28 DIAGNOSIS — R29898 Other symptoms and signs involving the musculoskeletal system: Secondary | ICD-10-CM | POA: Diagnosis not present

## 2016-02-28 DIAGNOSIS — I639 Cerebral infarction, unspecified: Secondary | ICD-10-CM

## 2016-02-28 DIAGNOSIS — R29818 Other symptoms and signs involving the nervous system: Secondary | ICD-10-CM | POA: Diagnosis not present

## 2016-02-28 DIAGNOSIS — I63411 Cerebral infarction due to embolism of right middle cerebral artery: Secondary | ICD-10-CM | POA: Diagnosis not present

## 2016-02-28 DIAGNOSIS — Z8673 Personal history of transient ischemic attack (TIA), and cerebral infarction without residual deficits: Secondary | ICD-10-CM | POA: Diagnosis present

## 2016-02-28 DIAGNOSIS — I10 Essential (primary) hypertension: Secondary | ICD-10-CM | POA: Diagnosis not present

## 2016-02-28 DIAGNOSIS — Z881 Allergy status to other antibiotic agents status: Secondary | ICD-10-CM

## 2016-02-28 DIAGNOSIS — E785 Hyperlipidemia, unspecified: Secondary | ICD-10-CM | POA: Diagnosis present

## 2016-02-28 DIAGNOSIS — Z8249 Family history of ischemic heart disease and other diseases of the circulatory system: Secondary | ICD-10-CM

## 2016-02-28 DIAGNOSIS — I6789 Other cerebrovascular disease: Secondary | ICD-10-CM | POA: Diagnosis not present

## 2016-02-28 DIAGNOSIS — M1990 Unspecified osteoarthritis, unspecified site: Secondary | ICD-10-CM | POA: Diagnosis present

## 2016-02-28 DIAGNOSIS — I6521 Occlusion and stenosis of right carotid artery: Secondary | ICD-10-CM | POA: Diagnosis present

## 2016-02-28 DIAGNOSIS — R270 Ataxia, unspecified: Secondary | ICD-10-CM | POA: Diagnosis present

## 2016-02-28 DIAGNOSIS — Z96652 Presence of left artificial knee joint: Secondary | ICD-10-CM | POA: Diagnosis present

## 2016-02-28 DIAGNOSIS — R531 Weakness: Secondary | ICD-10-CM | POA: Diagnosis not present

## 2016-02-28 LAB — PROTIME-INR
INR: 1.02
Prothrombin Time: 13.4 seconds (ref 11.4–15.2)

## 2016-02-28 LAB — COMPREHENSIVE METABOLIC PANEL
ALK PHOS: 51 U/L (ref 38–126)
ALT: 37 U/L (ref 17–63)
AST: 37 U/L (ref 15–41)
Albumin: 3.7 g/dL (ref 3.5–5.0)
Anion gap: 7 (ref 5–15)
BILIRUBIN TOTAL: 0.5 mg/dL (ref 0.3–1.2)
BUN: 17 mg/dL (ref 6–20)
CO2: 26 mmol/L (ref 22–32)
CREATININE: 0.79 mg/dL (ref 0.61–1.24)
Calcium: 9 mg/dL (ref 8.9–10.3)
Chloride: 106 mmol/L (ref 101–111)
GFR calc non Af Amer: >60 mL/min (ref >60)
Glucose, Bld: 126 mg/dL — ABNORMAL HIGH (ref 65–99)
Potassium: 4 mmol/L (ref 3.5–5.1)
Sodium: 139 mmol/L (ref 135–145)
Total Protein: 6.4 g/dL — ABNORMAL LOW (ref 6.5–8.1)

## 2016-02-28 LAB — I-STAT TROPONIN, ED: Troponin i, poc: 0 ng/mL (ref 0.00–0.08)

## 2016-02-28 LAB — I-STAT CHEM 8, ED
BUN: 18 mg/dL (ref 6–20)
CALCIUM ION: 1.15 mmol/L (ref 1.15–1.40)
CHLORIDE: 103 mmol/L (ref 101–111)
Creatinine, Ser: 0.8 mg/dL (ref 0.61–1.24)
GLUCOSE: 122 mg/dL — AB (ref 65–99)
HCT: 40 % (ref 39.0–52.0)
HEMOGLOBIN: 13.6 g/dL (ref 13.0–17.0)
Potassium: 3.8 mmol/L (ref 3.5–5.1)
Sodium: 142 mmol/L (ref 135–145)
TCO2: 27 mmol/L (ref 0–100)

## 2016-02-28 LAB — DIFFERENTIAL
Basophils Absolute: 0 10*3/uL (ref 0.0–0.1)
Basophils Relative: 1 %
Eosinophils Absolute: 0.1 10*3/uL (ref 0.0–0.7)
Eosinophils Relative: 1 %
LYMPHS PCT: 21 %
Lymphs Abs: 1 10*3/uL (ref 0.7–4.0)
MONO ABS: 0.4 10*3/uL (ref 0.1–1.0)
MONOS PCT: 8 %
NEUTROS ABS: 3.1 10*3/uL (ref 1.7–7.7)
Neutrophils Relative %: 69 %

## 2016-02-28 LAB — CBC
HEMATOCRIT: 39.4 % (ref 39.0–52.0)
Hemoglobin: 13.4 g/dL (ref 13.0–17.0)
MCH: 31.5 pg (ref 26.0–34.0)
MCHC: 34 g/dL (ref 30.0–36.0)
MCV: 92.5 fL (ref 78.0–100.0)
Platelets: 210 10*3/uL (ref 150–400)
RBC: 4.26 MIL/uL (ref 4.22–5.81)
RDW: 12.9 % (ref 11.5–15.5)
WBC: 4.4 10*3/uL (ref 4.0–10.5)

## 2016-02-28 LAB — APTT: aPTT: 31 seconds (ref 24–36)

## 2016-02-28 MED ORDER — ACETAMINOPHEN 650 MG RE SUPP: 650.0000 mg | RECTAL | Status: DC | PRN

## 2016-02-28 MED ORDER — STROKE: EARLY STAGES OF RECOVERY BOOK
Freq: Once | Status: DC
  Filled 2016-02-28: qty 1

## 2016-02-28 MED ORDER — GADOBENATE DIMEGLUMINE 529 MG/ML IV SOLN
15.0000 mL | Freq: Once | INTRAVENOUS | Status: AC
  Administered 2016-02-28: 14 mL via INTRAVENOUS

## 2016-02-28 MED ORDER — ASPIRIN EC 81 MG PO TBEC
81.0000 mg | DELAYED_RELEASE_TABLET | Freq: Every day | ORAL | Status: DC
  Administered 2016-02-29 – 2016-03-01 (×2): 81 mg via ORAL
  Filled 2016-02-28 (×2): qty 1

## 2016-02-28 MED ORDER — SODIUM CHLORIDE 0.9 % IV SOLN
INTRAVENOUS | Status: DC
  Administered 2016-02-28: via INTRAVENOUS

## 2016-02-28 MED ORDER — IOPAMIDOL (ISOVUE-370) INJECTION 76%
INTRAVENOUS | Status: AC
  Filled 2016-02-28: qty 50

## 2016-02-28 MED ORDER — ACETAMINOPHEN 325 MG PO TABS: 650.0000 mg | ORAL_TABLET | ORAL | Status: DC | PRN

## 2016-02-28 MED ORDER — ACETAMINOPHEN 160 MG/5ML PO SOLN: 650.0000 mg | ORAL | Status: DC | PRN

## 2016-02-28 NOTE — ED Notes (Signed)
Returned from CT.

## 2016-02-28 NOTE — ED Provider Notes (Signed)
[facility] DEPT Provider Note   CSN: [id number] Arrival date & time: 02/28/16  1638   An emergency department physician performed an initial assessment on this suspected stroke patient at 1700.  History   Chief Complaint Chief Complaint  Patient presents with  . Code Stroke    HPI Ricky [name]. is a 71 y.o. male.  HPI The patient presents with left hand weakness that started at approximately 11:30 AM this morning. He also complains of decreased sensation in his left hand. He has no additional acute complaints. He denies headache or changes in vision. He denies chest pain or shortness of breath. He denies abdominal pain. He has no additional acute complaints or concerns today.   Past Medical History:  Diagnosis Date  . Arthritis   . Hypertension     Patient Active Problem List   Diagnosis Date Noted  . Essential hypertension 12/20/2014  . OA (osteoarthritis) of knee 12/14/2013    Past Surgical History:  Procedure Laterality Date  . SHOULDER SURGERY     left- injections intermittent now  . TONSILLECTOMY    . TOTAL KNEE ARTHROPLASTY Left 12/14/2013   Procedure: LEFT TOTAL KNEE ARTHROPLASTY;  Surgeon: Gearlean Alf, MD;  Location: [facility];  Service: Orthopedics;  Laterality: Left;       Home Medications    Prior to Admission medications   Medication Sig Start Date End Date Taking? Authorizing Provider  atenolol (TENORMIN) 25 MG tablet Take 1 tablet (25 mg total) by mouth daily. 01/10/16   Marin Olp, MD  ketoconazole (NIZORAL) 2 % cream Apply 1 application topically daily. For at least 2 weeks 01/10/16   Marin Olp, MD  Multiple Vitamin (MULTIVITAMIN) tablet Take 1 tablet by mouth daily.    Historical Provider, MD    Family History Family History  Problem Relation Age of Onset  . Heart disease Father     in 16s- specifics unclear  . Other Mother     passed in late 75s    Social History Social History  Substance Use Topics  . Smoking  status: Never Smoker  . Smokeless tobacco: Never Used  . Alcohol use No     Allergies   Amoxicillin   Review of Systems Review of Systems  All other systems reviewed and are negative.    Physical Exam Updated Vital Signs BP 154/83 (BP Location: Right Arm)   Pulse (!) 57   Temp 98.3 F (36.8 C) (Oral)   Resp 16   SpO2 100%   Physical Exam  Constitutional: He is oriented to person, place, and time. He appears well-developed and well-nourished.  HENT:  Head: Normocephalic and atraumatic.  Neurological: He is alert and oriented to person, place, and time. No cranial nerve deficit.  Left pronator drift positive. Sensation unequal in the left and right hand. No cranial nerve deficit appreciated. Heel shin intact. Finger-nose-finger normal on the right and mildly abnormal on the left.     ED Treatments / Results  Labs (all labs ordered are listed, but only abnormal results are displayed) Labs Reviewed  I-STAT CHEM 8, ED - Abnormal; Notable for the following:       Result Value   Glucose, Bld 122 (*)    All other components within normal limits  CBC  DIFFERENTIAL  PROTIME-INR  APTT  COMPREHENSIVE METABOLIC PANEL  Randolm Idol, ED  CBG MONITORING, ED    EKG  EKG Interpretation  Date/Time:  Tuesday February 28 2016 16:52:00 EST Ventricular  Rate:  59 PR Interval:    QRS Duration: 107 QT Interval:  463 QTC Calculation: 459 R Axis:   -4 Text Interpretation:  Sinus rhythm No previous ECGs available Confirmed by YAO  MD, DAVID ([id number]) on 02/28/2016 4:56:51 PM       Radiology No results found.  Procedures Procedures (including critical care time)  Medications Ordered in ED Medications  iopamidol (ISOVUE-370) 76 % injection (not administered)     Initial Impression / Assessment and Plan / ED Course  I have reviewed the triage vital signs and the nursing notes.  Pertinent labs & imaging results that were available during my care of the patient were  reviewed by me and considered in my medical decision making (see chart for details).  The patient presented with acute onset of left hand weakness. Review of systems was unremarkable. Physical examination revealed left-sided pronator drift and difficulties with finger-nose-finger. Subjective sensation difference in the left hand compared with the right. Code stroke was activated. The patient will be admitted to inpatient medicine for further management and evaluation.  Final Clinical Impressions(s) / ED Diagnoses   Final diagnoses:  None    New Prescriptions New Prescriptions   No medications on file     Ophelia Shoulder, MD 02/28/16 Antimony Yao, MD 03/01/16 1537

## 2016-02-28 NOTE — Telephone Encounter (Signed)
Noted  

## 2016-02-28 NOTE — Progress Notes (Signed)
Subjective:     Patient ID: Ricky Days., male   DOB: 1945-02-08, 71 y.o.   MRN: [id number]  HPI Patient seen as acute work in with left upper extremity clumsiness and weakness which started around 11:30 AM today. He has history of hypertension which is treated with low-dose atenolol and was in his usual state of health until about 11:30 this morning. He got up this morning and felt fine and went to the gym and worked out as usual. Sometime around 11:30 AM he noticed difficulty writing with the left hand. He is left-hand dominant. He also noticed some sensory changes of the hand and possibly forearm and possibly some weakness as well. He had clumsiness with things like eating. No speech changes. No lower extremity symptoms. No chest pain.  Denied any visual changes or dysphagia. He feels his symptoms may be slightly better but he still has some weakness and clumsiness persisting. He apparently called [facility] and was scheduled for 3:00 appointment here. He denies any prior history of cerebrovascular disease. No cardiac history other than hypertension. Denies any headache. Does not take any aspirin. His only medication is low-dose atenolol 25 mg once daily. Generally very healthy.  He's had favorable lipids in the past. No history of diabetes. No history of any known arrhythmia such as atrial fibrillation. No cognitive changes  Past Medical History:  Diagnosis Date  . Arthritis   . Hypertension    Past Surgical History:  Procedure Laterality Date  . SHOULDER SURGERY     left- injections intermittent now  . TONSILLECTOMY    . TOTAL KNEE ARTHROPLASTY Left 12/14/2013   Procedure: LEFT TOTAL KNEE ARTHROPLASTY;  Surgeon: Gearlean Alf, MD;  Location: [facility];  Service: Orthopedics;  Laterality: Left;    reports that he has never smoked. He has never used smokeless tobacco. He reports that he does not drink alcohol or use drugs. family history includes Heart disease in his father; Other in  his mother. Allergies  Allergen Reactions  . Amoxicillin Rash     Review of Systems  Constitutional: Negative for fatigue.  HENT: Negative for trouble swallowing.   Eyes: Negative for visual disturbance.  Respiratory: Negative for cough, chest tightness and shortness of breath.   Cardiovascular: Negative for chest pain, palpitations and leg swelling.  Gastrointestinal: Negative for abdominal pain.  Neurological: Positive for weakness and numbness. Negative for dizziness, seizures, syncope, facial asymmetry, speech difficulty, light-headedness and headaches.  Psychiatric/Behavioral: Negative for confusion.       Objective:   Physical Exam  Constitutional: He is oriented to person, place, and time. He appears well-developed and well-nourished.  HENT:  Mouth/Throat: Oropharynx is clear and moist.  Eyes: Pupils are equal, round, and reactive to light.  Neck: Neck supple. No thyromegaly present.  No carotid bruits  Cardiovascular: Normal rate and regular rhythm.  Exam reveals no gallop.   Pulmonary/Chest: Effort normal and breath sounds normal. No respiratory distress. He has no wheezes. He has no rales.  Musculoskeletal: He exhibits no edema.  Neurological: He is alert and oriented to person, place, and time. No cranial nerve deficit. Coordination abnormal.  Patient has good grip strength bilaterally. He has some pronator drift on the left upper extremity. Abnormal finger to nose testing on the left but normal on the right. Gait is normal  Psychiatric: He has a normal mood and affect. His behavior is normal.       Assessment:     Acute onset around 11:30 AM  today of some left upper extremity weakness and abnormal coordination left hand.  Concern is for TIA versus CVA. He has not had any progressive symptoms since onset    Plan:     -We recommended transport by EMS to come hospital for further evaluation -Patient and his wife agree to this plan  Eulas Post MD [place]e  Primary Care at [facility]

## 2016-02-28 NOTE — ED Triage Notes (Signed)
Pt to ED via [facility] from [facility]-- had left handed coordination problems.

## 2016-02-28 NOTE — Consult Note (Signed)
Neurology Consult Note  Reason for Consultation: CODE STROKE  Requesting provider: Shirlyn Goltz, MD  CC: Left arm clumsiness  HPI: This is a 77-yo RH man who presents to the ED for the evaluation of L hand/arm clumsiness. History is obtained directly from the patient hwo is an excellent historian.   He reports that he was in his usual state of health until about 11 am today. He had gone to the gym where he rode the stationary bike, did some crunches and did some light weight training. On his way home he went to the store. He felt fine while shopping. When he got home, however, he states that his wife noted that he seemed to be having trouble controlling his L hand. He initially was not aware of this but after it was called to his attention he noted that his left hand felt a little strange. He was having difficulty holding things with that hand and also noted a decrease in fine motor control with the left hand. Of note, he is left hand dominant. He does not think he had any actual weakness. He does endorse decreased sensation in the L hand. He thought that his symptoms would go away but when they persisted he came to the ED for evaluation. Presently he thinks that his symptoms have improved but he is not back to his normal baseline yet.   He denies any vision loss, double vision, difficulty swallowing, difficulty speaking, headache, weakness, gait changes, or balance difficulties. He has not had any recent blows or injuries to the head or neck.   Last known well: 1100 NIHSS score: 1  tPA given?: No, out of window with minimal deficits   PMH: Reviewed with patient, as noted below: Past Medical History:  Diagnosis Date  . Arthritis   . Hypertension     PSH: Reviewed with patient, as noted below: Past Surgical History:  Procedure Laterality Date  . SHOULDER SURGERY     left- injections intermittent now  . TONSILLECTOMY    . TOTAL KNEE ARTHROPLASTY Left 12/14/2013   Procedure: LEFT TOTAL KNEE  ARTHROPLASTY;  Surgeon: Gearlean Alf, MD;  Location: [facility];  Service: Orthopedics;  Laterality: Left;    Family history: Reviewed with patient, as noted below: Family History  Problem Relation Age of Onset  . Heart disease Father     in 67s- specifics unclear  . Other Mother     passed in late 44s    Social history:  Reviewed with patient, as noted below: Social History   Social History  . Marital status: Married    Spouse name: N/A  . Number of children: N/A  . Years of education: N/A   Occupational History  . Not on file.   Social History Main Topics  . Smoking status: Never Smoker  . Smokeless tobacco: Never Used  . Alcohol use No     Comment: former drinker  . Drug use: No  . Sexual activity: Yes   Other Topics Concern  . Not on file   Social History Narrative   Married. 1 son. 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       Hobbies: gym daily- riding exercise bike plus weights/machines, photography- used to do a lot of black and white, gardening      Current inpatient meds:  Current Facility-Administered Medications  Medication Dose Route Frequency Provider Last Rate Last Dose  . iopamidol (ISOVUE-370)  76 % injection            Current Outpatient Prescriptions  Medication Sig Dispense Refill  . atenolol (TENORMIN) 25 MG tablet Take 1 tablet (25 mg total) by mouth daily. 90 tablet 3  . ketoconazole (NIZORAL) 2 % cream Apply 1 application topically daily. For at least 2 weeks 30 g 0  . Multiple Vitamin (MULTIVITAMIN) tablet Take 1 tablet by mouth daily.      Allergies: Allergies  Allergen Reactions  . Amoxicillin Rash    ROS: As per HPI. A full 14-point review of systems was performed and is otherwise unremarkable. He does endorse some occasional discomfort in his left knee if he stands up too long, attributed to the fact that he had a L knee replacement in the past.   PE:  BP 154/83  (BP Location: Right Arm)   Pulse (!) 57   Temp 98.3 F (36.8 C) (Oral)   Resp 16   SpO2 100%   General: WDWN, no acute distress. AAO x4. Speech clear, no dysarthria. No aphasia. Follows commands briskly. Affect is bright with congruent mood. Comportment is normal.  HEENT: Normocephalic. Neck supple without LAD. MMM, OP clear. Dentition good. Sclerae anicteric. No conjunctival injection.  CV: Regular, no murmur. Carotid pulses full and symmetric, no bruits. Distal pulses 2+ and symmetric.  Lungs: CTAB.  Abdomen: Soft, non-distended, non-tender. Bowel sounds present x4.  Extremities: No C/C/E. Neuro:  CN: Pupils are equal and round. They are symmetrically reactive from 3-->2 mm. EOMI without nystagmus. No reported diplopia. Facial sensation is intact to light touch. Face is symmetric at rest with normal strength and mobility. Hearing is intact to conversational voice. Palate elevates symmetrically and uvula is midline. Voice is normal in tone, pitch and quality. Bilateral SCM and trapezii are 5/5. Tongue is midline with normal bulk and mobility.  Motor: Normal bulk, tone, and strength. No tremor or other abnormal movements. There is upward drift of the outstretched L hand.  Sensation: Decreased to light touch, pinprick, and vibration on the L side.  DTRs: 2+, symmetric. Toes downgoing bilaterally. No pathologic reflexes.  Coordination: Finger-to-nose is mildly unsteady on the L but no overt dysmetria. Heel-to-shin is without dysmetria bilaterally. Finger taps are normal in amplitude and speed, no decrement.   Labs:  Lab Results  Component Value Date   WBC 4.4 02/28/2016   HGB 13.6 02/28/2016   HCT 40.0 02/28/2016   PLT 210 02/28/2016   GLUCOSE 122 (H) 02/28/2016   CHOL 154 01/10/2016   TRIG 44.0 01/10/2016   HDL 70.80 01/10/2016   LDLCALC 75 01/10/2016   ALT 20 01/10/2016   AST 28 01/10/2016   NA 142 02/28/2016   K 3.8 02/28/2016   CL 103 02/28/2016   CREATININE 0.80 02/28/2016    BUN 18 02/28/2016   CO2 29 01/10/2016   TSH 1.64 01/10/2016   PSA 1.22 11/30/2013   INR 1.02 02/28/2016    Imaging:  I have personally and independently reviewed the [facility] without contrast from today. There is no acute abnormality. Mild chronic small vessel disease is noted in the bihemispheric white matter.    Assessment and Plan:  1. Acute Ischemic Stroke: This is an acute stroke. It is most likely small vessel in distribution given symptoms. Known risk factors for cerebrovascular disease in this patient include HTN and age. Additional workup will be ordered to include MRI brain, MRA of the head and neck, TTE, fasting lipids, and hemoglobin a1c. Further testing will  be determined by results from these initial studies. Recommend antiplatelet therapy with aspirin 81 mg daily for secondary stroke prevention once cleared to take oral medications. I will start atorvastatin 40 mg with goal LDL less than 70. Ensure adequate glucose control. Allow permissive hypertension in the acute phase, treating only SBP greater than 220 mmHg and/or DBP greater than 110 mmHg. BP can be gradually lowered to goal after 24 hours. Avoid fever and hyperglycemia as these can extend the infarct. Avoid hypotonic IVF to minimize exacerbation of post-stroke edema. Initiate rehab services. DVT prophylaxis as needed.   2. L hemisensory loss: This is mild but involves all modalities on the L. This is acute, consistent with acute stroke. PT/OT as needed.   3. L ataxia: He has a mild sensory ataxia. This is acute, due to stroke.  PT/OT.   This was discussed with the patient who is in agreement with the plan. He was given the chance to ask any questions and these were addressed to his satisfaction.   This was discussed with the ED MD at the time of my consult.   Thanks for this consult. Please call if any questions. The stroke team will assume care of the patient on 02/29/16.

## 2016-02-28 NOTE — ED Notes (Signed)
Patient transported to CT 

## 2016-02-28 NOTE — Telephone Encounter (Signed)
Patient Name: Ricky [name]  DOB: December 18, 1945    Initial Comment Caller states c/o left arm loss of sensation and difficulty holding items since this morning.    Nurse Assessment  Nurse: Thad Ranger RN, Denise Date/Time (Eastern Time): 02/28/2016 2:29:48 PM  Confirm and document reason for call. If symptomatic, describe symptoms. ---Caller states c/o left arm loss of sensation in his hand and difficulty holding items since this morning. Denies neck, arm, or shoulder pain.  Does the patient have any new or worsening symptoms? ---Yes  Will a triage be completed? ---Yes  Related visit to physician within the last 2 weeks? ---No  Does the PT have any chronic conditions? (i.e. diabetes, asthma, etc.) ---No  Is this a behavioral health or substance abuse call? ---No     Guidelines    Guideline Title Affirmed Question Affirmed Notes  Neurologic Deficit [1] Numbness (i.e., loss of sensation) of the face, arm / hand, or leg / foot on one side of the body AND [2] gradual onset (e.g., days to weeks) AND [3] present now    Final Disposition User   See Physician within 4 Hours (or PCP triage) Thad Ranger, RN, Langley Gauss    Comments  Loss of control of fine movments in the left hand (writing) and drops a glass if he is not paying direct attention to having an object in his hand. States he is unsure if he has weakness in the hand/fingers, but unable to perform any action w/o firm concentration on what he is attempting to do. States if he has alos lost his sense of touch in the fingers. States if he touches his hair, he would not know it is hair that he is touching. States he worked out this am and s/s started just as he got home, but he denies neck/back, shoulder, arm, hand or finger injuries. Erring on side of caution and having him eval by MD.  Durward Fortes to have another adult drive him to the [facility]. Unable to sche an appt at the avail opening at 1500 today w/Dr Burchette. Called [facility] and Vita Barley was able to sched the appt at  3pm today. Pt aware/agreeable with poc.   Referrals  REFERRED TO PCP OFFICE   Disagree/Comply: Comply

## 2016-02-28 NOTE — H&P (Signed)
History and Physical    Ricky [name]. [id number] DOB: 03/21/1945 DOA: 02/28/2016  PCP: Ricky Reddish, MD   Patient coming from: PCP's office  Chief Complaint: Left hand numbness, decreased coordination  HPI: Ricky [name]. is a 71 y.o. gentleman with a history of HTN and OA who was in his baseline state of health this morning.  He was just returning from the gym around 11:30AM, when he developed acute onset decreased coordination and decreased sensation in his left hand.  He denies "true weakness" in the left upper extremity, but he admits that he was having trouble holding things like his cell phone or the remote control.  His wife observed his symptoms.  No slurred speech or word finding difficulty.  No associated headache, light-headedness, or LOC.  No facial droop.  No weakness in his left leg.  The patient did not actually present to his PCP for evaluation until several hours later, around 3PM.  He was referred to the ED immediately via EMS, as a Code Stroke, for emergent evaluation.    ED Course: STAT head CT negative for acute CVA.  EKG shows a sinus bradycardia.  Labs are essentially unremarkable.  Neurology has seen the patient.  MRI brain and MRA head and neck already ordered.  Hospitalist asked to admit.  Review of Systems: As per HPI otherwise 10 point review of systems negative.    Past Medical History:  Diagnosis Date  . Arthritis   . Hypertension     Past Surgical History:  Procedure Laterality Date  . SHOULDER SURGERY     left- injections intermittent now  . TONSILLECTOMY    . TOTAL KNEE ARTHROPLASTY Left 12/14/2013   Procedure: LEFT TOTAL KNEE ARTHROPLASTY;  Surgeon: Ricky Alf, MD;  Location: [facility];  Service: Orthopedics;  Laterality: Left;     reports that he has never smoked. He has never used smokeless tobacco. He reports that he does not drink alcohol or use drugs.  He is married.  Allergies  Allergen Reactions  . Amoxicillin Rash     Family History  Problem Relation Age of Onset  . Heart disease Father     in 2s- specifics unclear  . Other Mother     passed in late 17s     Prior to Admission medications   Medication Sig Start Date End Date Taking? Authorizing Provider  acetaminophen (TYLENOL) 500 MG tablet Take 500 mg by mouth every 6 (six) hours as needed.   Yes Historical Provider, MD  atenolol (TENORMIN) 25 MG tablet Take 1 tablet (25 mg total) by mouth daily. 01/10/16  Yes Ricky Olp, MD  Multiple Vitamin (MULTIVITAMIN) tablet Take 1 tablet by mouth daily.   Yes Historical Provider, MD    Physical Exam: Vitals:   02/28/16 1644  BP: 154/83  Pulse: (!) 57  Resp: 16  Temp: 98.3 F (36.8 C)  TempSrc: Oral  SpO2: 100%      Constitutional: NAD, calm, comfortable, NONtoxic appearing Vitals:   02/28/16 1644  BP: 154/83  Pulse: (!) 57  Resp: 16  Temp: 98.3 F (36.8 C)  TempSrc: Oral  SpO2: 100%   Eyes: PERRL, lids and conjunctivae normal ENMT: Mucous membranes are moist. Posterior pharynx clear of any exudate or lesions. Normal dentition.  Neck: normal appearance, supple, no masses Respiratory: clear to auscultation bilaterally, no wheezing, no crackles. Normal respiratory effort. No accessory muscle use.  Cardiovascular: Normal rate, regular rhythm, no murmurs / rubs / gallops. No extremity  edema. 2+ pedal pulses. No carotid bruits.  GI: abdomen is soft and compressible.  No distention.  No tenderness.  No masses palpated.  Bowel sounds are present. Musculoskeletal:  No joint deformity in upper and lower extremities. Good ROM, no contractures. Normal muscle tone.  Skin: no rashes, warm and dry Neurologic: CN 2-12 grossly intact. Decreased sensation in his left hand though strength actually appears symmetric.  he has difficulty with finger-to-nose pointing with his left hand compared to the right.  No deficits in his lower extremities.  Psychiatric: Normal judgment and insight. Alert and  oriented x 3. Normal mood.     Labs on Admission: I have personally reviewed following labs and imaging studies  CBC:  Recent Labs Lab 02/28/16 1701 02/28/16 1709  WBC 4.4  --   NEUTROABS 3.1  --   HGB 13.4 13.6  HCT 39.4 40.0  MCV 92.5  --   PLT 210  --    Basic Metabolic Panel:  Recent Labs Lab 02/28/16 1701 02/28/16 1709  NA 139 142  K 4.0 3.8  CL 106 103  CO2 26  --   GLUCOSE 126* 122*  BUN 17 18  CREATININE 0.79 0.80  CALCIUM 9.0  --    GFR: Estimated Creatinine Clearance: 71.9 mL/min (by C-G formula based on SCr of 0.8 mg/dL). Liver Function Tests:  Recent Labs Lab 02/28/16 1701  AST 37  ALT 37  ALKPHOS 51  BILITOT 0.5  PROT 6.4*  ALBUMIN 3.7   Coagulation Profile:  Recent Labs Lab 02/28/16 1701  INR 1.02   Radiological Exams on Admission: Ct Head Code Stroke W/o Cm  Result Date: 02/28/2016 CLINICAL DATA:  Code stroke. Code stroke. Sudden onset left hand weakness. Loss of cord nasion in the left hand. EXAM: CT HEAD WITHOUT CONTRAST TECHNIQUE: Contiguous axial images were obtained from the base of the skull through the vertex without intravenous contrast. COMPARISON:  None. FINDINGS: Brain: No acute cortical infarct, hemorrhage, or mass lesion is present. The precentral gyrus is intact. No focal cortical lesions are evident. The basal ganglia are within normal limits bilaterally. The insular cortex is normal. Brainstem and cerebellum are normal. Vascular: No hyperdense vessel or unexpected calcification. Skull: The calvarium is intact. No focal lytic or blastic lesions are present. Sinuses/Orbits: Mild mucosal thickening is present within posterior ethmoid air cells bilaterally. The remaining paranasal sinuses and the mastoid air cells are clear. ASPECTS [facility] Stroke Program Early CT Score) - Ganglionic level infarction (caudate, lentiform nuclei, internal capsule, insula, M1-M3 cortex): 7/7 - Supraganglionic infarction (M4-M6 cortex): 3/3 Total score  (0-10 with 10 being normal): 10/10 IMPRESSION: 1. Normal CT appearance of the brain for age.  No acute infarct. 2. Mild posterior ethmoid sinus disease. 3. ASPECTS is 10/10 These results were called by telephone at the time of interpretation on 02/28/2016 at 5:19 pm to Dr. Shon Hale, who verbally acknowledged these results. Electronically Signed   By: San Morelle M.D.   On: 02/28/2016 17:28    EKG: Independently reviewed. Sinus bradycardia.  Assessment/Plan Principal Problem:   Acute ischemic stroke [facility]) Active Problems:   Essential hypertension      Acute CVA --Neurology input greatly appreciated. --MRI/MRA pending. --He will need echo. --ASA 81mg  daily for now. --A1c pending.  It appears that the patient had a recent lipid panel in December. --Permissive HTN for now; will hold home dose of atenolol and monitor. --PT/OT/Speech eval and treat per protocol  DVT prophylaxis: SCDs Code Status: FULL Family Communication:  Patient alone in the ED at time of admission. Disposition Plan: Expect he will go home at discharge. Consults called: Neurology Admission status: Place in observation with telemetry monitoring.   TIME SPENT: 50 minutes   Eber Jones MD [facility] Pager [phone]  If 7PM-7AM, please contact night-coverage [email or web address] Password [email or web address]  02/28/2016, 8:25 PM

## 2016-02-28 NOTE — Progress Notes (Signed)
Pre visit review using our clinic review tool, if applicable. No additional management support is needed unless otherwise documented below in the visit note. 

## 2016-02-29 ENCOUNTER — Observation Stay (HOSPITAL_BASED_OUTPATIENT_CLINIC_OR_DEPARTMENT_OTHER): Payer: Medicare Other

## 2016-02-29 ENCOUNTER — Encounter: Payer: Self-pay | Admitting: *Deleted

## 2016-02-29 DIAGNOSIS — M1990 Unspecified osteoarthritis, unspecified site: Secondary | ICD-10-CM | POA: Diagnosis present

## 2016-02-29 DIAGNOSIS — R270 Ataxia, unspecified: Secondary | ICD-10-CM | POA: Diagnosis present

## 2016-02-29 DIAGNOSIS — I639 Cerebral infarction, unspecified: Secondary | ICD-10-CM

## 2016-02-29 DIAGNOSIS — I6789 Other cerebrovascular disease: Secondary | ICD-10-CM | POA: Diagnosis not present

## 2016-02-29 DIAGNOSIS — I4891 Unspecified atrial fibrillation: Secondary | ICD-10-CM | POA: Diagnosis present

## 2016-02-29 DIAGNOSIS — Z006 Encounter for examination for normal comparison and control in clinical research program: Secondary | ICD-10-CM

## 2016-02-29 DIAGNOSIS — Z881 Allergy status to other antibiotic agents status: Secondary | ICD-10-CM | POA: Diagnosis not present

## 2016-02-29 DIAGNOSIS — I6521 Occlusion and stenosis of right carotid artery: Secondary | ICD-10-CM | POA: Diagnosis present

## 2016-02-29 DIAGNOSIS — Z8249 Family history of ischemic heart disease and other diseases of the circulatory system: Secondary | ICD-10-CM | POA: Diagnosis not present

## 2016-02-29 DIAGNOSIS — E785 Hyperlipidemia, unspecified: Secondary | ICD-10-CM | POA: Diagnosis present

## 2016-02-29 DIAGNOSIS — R29701 NIHSS score 1: Secondary | ICD-10-CM | POA: Diagnosis present

## 2016-02-29 DIAGNOSIS — Z96652 Presence of left artificial knee joint: Secondary | ICD-10-CM | POA: Diagnosis present

## 2016-02-29 DIAGNOSIS — I6529 Occlusion and stenosis of unspecified carotid artery: Secondary | ICD-10-CM | POA: Diagnosis present

## 2016-02-29 DIAGNOSIS — I6522 Occlusion and stenosis of left carotid artery: Secondary | ICD-10-CM | POA: Diagnosis not present

## 2016-02-29 DIAGNOSIS — I63411 Cerebral infarction due to embolism of right middle cerebral artery: Secondary | ICD-10-CM | POA: Diagnosis present

## 2016-02-29 DIAGNOSIS — I10 Essential (primary) hypertension: Secondary | ICD-10-CM | POA: Diagnosis not present

## 2016-02-29 LAB — VAS US CAROTID
LCCADDIAS: 24 cm/s
LCCAPSYS: 129 cm/s
LEFT ECA DIAS: -15 cm/s
LEFT VERTEBRAL DIAS: 23 cm/s
LICADSYS: -123 cm/s
Left CCA dist sys: 112 cm/s
Left CCA prox dias: 33 cm/s
Left ICA dist dias: -37 cm/s
Left ICA prox dias: 24 cm/s
Left ICA prox sys: 110 cm/s
RCCADSYS: -83 cm/s
RCCAPDIAS: 17 cm/s
RIGHT ECA DIAS: -13 cm/s
RIGHT VERTEBRAL DIAS: 13 cm/s
Right CCA prox sys: 98 cm/s

## 2016-02-29 LAB — ECHOCARDIOGRAM COMPLETE
HEIGHTINCHES: 65.5 in
Weight: 2268.8 oz

## 2016-02-29 MED ORDER — ATORVASTATIN CALCIUM 10 MG PO TABS
20.0000 mg | ORAL_TABLET | Freq: Every day | ORAL | Status: DC
  Administered 2016-02-29: 20 mg via ORAL
  Filled 2016-02-29: qty 2

## 2016-02-29 NOTE — Progress Notes (Signed)
Pt leaving unit for Echo.

## 2016-02-29 NOTE — Progress Notes (Signed)
  Echocardiogram 2D Echocardiogram has been performed.  Jennette Dubin 02/29/2016, 9:21 AM

## 2016-02-29 NOTE — Care Management Note (Signed)
Case Management Note  Patient Details  Name: Ricky [name]. MRN: [id number] Date of Birth: 23-Nov-1945  Subjective/Objective:        Patient was admitted with CVA. Lives at home with spouse. CM will follow for discharge needs pending PT/OT evals and physician orders.             Action/Plan:   Expected Discharge Date:                  Expected Discharge Plan:     In-House Referral:     Discharge planning Services     Post Acute Care Choice:    Choice offered to:     DME Arranged:    DME Agency:     HH Arranged:    HH Agency:     Status of Service:     If discussed at [employer] of Stay Meetings, dates discussed:    Additional Comments:  Rolm Baptise, RN 02/29/2016, 12:22 PM

## 2016-02-29 NOTE — Progress Notes (Signed)
STROKE TEAM PROGRESS NOTE   HISTORY OF PRESENT ILLNESS (per record) This is a [name] who presents to the ED for the evaluation of L hand/arm clumsiness. History is obtained directly from the patient hwo is an excellent historian.   He reports that he was in his usual state of health until about 11 am today. He had gone to the gym where he rode the stationary bike, did some crunches and did some light weight training. On his way home he went to the store. He felt fine while shopping. When he got home, however, he states that his wife noted that he seemed to be having trouble controlling his L hand. He initially was not aware of this but after it was called to his attention he noted that his left hand felt a little strange. He was having difficulty holding things with that hand and also noted a decrease in fine motor control with the left hand. Of note, he is left hand dominant. He does not think he had any actual weakness. He does endorse decreased sensation in the L hand. He thought that his symptoms would go away but when they persisted he came to the ED for evaluation. Presently he thinks that his symptoms have improved but he is not back to his normal baseline yet.   He denies any vision loss, double vision, difficulty swallowing, difficulty speaking, headache, weakness, gait changes, or balance difficulties. He has not had any recent blows or injuries to the head or neck.   Last known well: 1100 NIHSS score: 1  tPA given?: No, out of window with minimal deficits   SUBJECTIVE (INTERVAL HISTORY) Patient still has right-sided incoordination and mild weakness. He denies prior history of stroke or TIA or known   carotid stenosis   OBJECTIVE Temp:  [97.7 F (36.5 C)-99.3 F (37.4 C)] 99.3 F (37.4 C) (01/24 1319) Pulse Rate:  [52-70] 56 (01/24 1319) Cardiac Rhythm: Sinus bradycardia (01/24 0701) Resp:  [16-20] 18 (01/24 1319) BP: (115-154)/(57-94) 119/69 (01/24 1319) SpO2:  [95  %-100 %] 95 % (01/24 1319) Weight:  [64.3 kg (141 lb 12.8 oz)-64.9 kg (143 lb)] 64.3 kg (141 lb 12.8 oz) (01/23 2115)  CBC:   Recent Labs Lab 02/28/16 1701 02/28/16 1709  WBC 4.4  --   NEUTROABS 3.1  --   HGB 13.4 13.6  HCT 39.4 40.0  MCV 92.5  --   PLT 210  --     Basic Metabolic Panel:   Recent Labs Lab 02/28/16 1701 02/28/16 1709  NA 139 142  K 4.0 3.8  CL 106 103  CO2 26  --   GLUCOSE 126* 122*  BUN 17 18  CREATININE 0.79 0.80  CALCIUM 9.0  --     Lipid Panel:     Component Value Date/Time   CHOL 154 01/10/2016 1100   TRIG 44.0 01/10/2016 1100   HDL 70.80 01/10/2016 1100   CHOLHDL 2 01/10/2016 1100   VLDL 8.8 01/10/2016 1100   LDLCALC 75 01/10/2016 1100   HgbA1c: No results found for: HGBA1C Urine Drug Screen: No results found for: LABOPIA, COCAINSCRNUR, LABBENZ, AMPHETMU, THCU, LABBARB    IMAGING  Mr Ricky [name] Neck W Wo Contrast 02/28/2016  MRI HEAD  1. Acute moderate-size right MCA territory infarct involving the right insula as well as the right frontal and parietal lobes as above. No associated mass effect or hemorrhage.  2. Otherwise negative MRI of the brain.   MRA HEAD 1. Occluded distal right  M3 branch, compatible with acute right MCA infarction.  2. Hypoplastic/absent right A1 segment with overall diminutive right carotid artery system.  3. Otherwise negative MRA of the intracranial circulation.   MRA NECK  1. Short-segment severe stenosis at the origin of the right ICA (estimated 80-90%).   Stenosis begins at the bifurcation and measures approximately 5 mm in length.  2. Widely patent left carotid artery system.  3. Widely patent vertebral arteries within the neck.    Ct Head Code Stroke W/o Cm 02/28/2016 1. Normal CT appearance of the brain for age.  No acute infarct.  2. Mild posterior ethmoid sinus disease.  3. ASPECTS is 10/10    Transthoracic Echocardiogram 02/29/2016 Study Conclusions - Left ventricle: The cavity size was  normal. Wall thickness was   normal. Systolic function was normal. The estimated ejection   fraction was in the range of 60% to 65%. Wall motion was normal;   there were no regional wall motion abnormalities. Doppler   parameters are consistent with abnormal left ventricular   relaxation (grade 1 diastolic dysfunction). The E/e&' ratio is <8,   suggesting normal LV filling pressure. 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 There was mild regurgitation. - Left atrium: The atrium was normal in size. - Tricuspid valve: There was mild regurgitation. - Pulmonary arteries: PA peak pressure: 32 mm Hg (S). - Inferior vena cava: The vessel was normal in size. The   respirophasic diameter changes were in the normal range (>= 50%),   consistent with normal central venous pressure. Impressions: - LVEF 60-65%, normal wall thickness, normal wall motion, diastolic   dysfunction with normal LV filling pressure, mild posterior   leaflet prolapse of the mitral valve with mild MR, normal LA   size, mild TR, RVSP 32 mmHg, normal IVC.    PHYSICAL EXAM Pleasant elderly Caucasian male not in distress. . Afebrile. Head is nontraumatic. Neck is supple without bruit.    Cardiac exam no murmur or gallop. Lungs are clear to auscultation. Distal pulses are well felt.  Neurological Exam :  Awake alert oriented 3 with normal speech and language function. Pupils equal reactive. Fundi were not visualized. Vision acuity and fields seem adequate. Mild left lower facial asymmetry when he smiles. Tongue midline. Mild left upper extremity drift. Mild weakness of left grip and intrinsic hand muscles. Diminished touch pinprick sensation on the left hemibody. Left mild sensory inattention present. Impaired proprioception in the left upper and lower extremity. Mild incoordination of the left upper and lower extremity. Deep tendon reflexes are symmetric. Plantars are  downgoing. Gait was not tested.   ASSESSMENT/PLAN [name]. is a 71 y.o. male with history of hypertension and arthritis  presenting with left upper extremity clumsiness. He did not receive IV t-PA due to late presentation.  Stroke:  Non-dominant infarct likely embolic embolic secondary to proximal  right internal carotid artery stenosis.  Resultant  Left hemibody sensory and proprioceptive loss  MRI - Acute moderate-size Rt MCA territory infarct involving the Rt insula as well as the Rt frontal and parietal lobes.  MRA Head - Occluded distal right M3 branch, compatible with acute right MCA infarction.  MRA Neck - Short-segment severe stenosis at the origin of the right ICA (estimated 80-90%).  Carotid Doppler - MRA neck  2D Echo - EF 60-65%. Mild MR - see report above.  LDL - 75  HgbA1c pending  VTE prophylaxis - SCDs Diet  regular Room service appropriate? Yes; Fluid consistency: Thin  No antithrombotic prior to admission, now on aspirin 81 mg daily  Patient counseled to be compliant with his antithrombotic medications  Ongoing aggressive stroke risk factor management  Therapy recommendations: No follow-up PT recommended.  Disposition:  Pending  Hypertension  Stable  Permissive hypertension (OK if < 220/120) but gradually normalize in 5-7 days  Long-term BP goal normotensive  Hyperlipidemia  Home meds: No lipid lowering medications prior to admission.  LDL 75, goal < 70  Add low-dose Lipitor 20 mg daily  Continue statin at discharge    Other Stroke Risk Factors  Advanced age    Other Active Problems  Right internal carotid artery stenosis - consulted Dr. Oneida Alar  Mild hyperglycemia - await hemoglobin A1c    PLAN  Dr. Leonie Man spoke to the patient regarding the atrial fibrillation trial. The research coordinator will meet with the patient.Vascular surgery consult for Rt CEA in 1-2 weeks.  Hospital day # 0  Mikey Bussing PA-C [facility] Pager [phone] 02/29/2016, 2:29 PM I have personally examined this patient, reviewed notes, independently viewed imaging studies, participated in medical decision making and plan of care.ROS completed by me personally and pertinent positives fully documented  I have made any additions or clarifications directly to the above note. Agree with note above. He has presented with mild left-sided weakness and mostly sensory impairment secondary to symptomatic right proximal carotid stenosis and will benefit with right carotid revascularization. We'll consult vascular surgery. He may also consider possible participation in the stroke atrial fibrillation trial. He shown some interest. Coordinator will talk to him. No study specific procedure is done prior to patient signing consent form. Greater than 50% time during this 35 minute visit was spent on counseling and coordination of care about stroke risk, evaluation, carotid stenosis, revascularization and answering questions  Antony Contras, MD Medical Director [facility] Pager: [phone] 02/29/2016 2:59 PM   To contact Stroke Continuity provider, please refer to [email or web address] After hours, contact General Neurology

## 2016-02-29 NOTE — Consult Note (Signed)
SLP Cancellation Note  Patient Details Name: Ricky [name]. MRN: [id number] DOB: 06/03/1945   Cancelled treatment:       Orders received for cog/com evaluation. Unable to complete at this time, as pt is currently off unit for testing.  Will continue efforts.  Aashna Matson B. [place], [facility], [place]  Shonna Chock 02/29/2016, 9:21 AM

## 2016-02-29 NOTE — Progress Notes (Signed)
Presented the STROKE-AF research study to patient and family. Patient would like to think about participating in study. Informed patient that if he would be randomized to loop that Dr. Rayann Heman could implant tomorrow Monday or Tuesday. But he officially he had until Friday to make a decision. Questions encouraged and answered

## 2016-02-29 NOTE — Progress Notes (Signed)
Pt returned from Echo

## 2016-02-29 NOTE — Evaluation (Signed)
Occupational Therapy Evaluation Patient Details Name: Ricky [name]. MRN: [id number] DOB: May 24, 1945 Today's Date: 02/29/2016    History of Present Illness Patient is a 71 y/o male admitted with decreased coordination and decreased sensation in his left hand.  MRI positive for R MCA CVA.  H/o TKA, shoulder surgery and HTN.   Clinical Impression   Pt is active and independent at baseline. He demonstrates good balance, supervised mobility only due to IV pole. Pt presents with impaired proprioception, light touch and pain sensation in L UE and describes his L arm as feeling like it belongs to somebody else. Pt educated in compensatory strategies for ADL and sensation loss as well as safety to prevent injury to L UE. Will follow acutely. Recommend OPOT upon discharge.    Follow Up Recommendations  Outpatient OT    Equipment Recommendations  None recommended by OT    Recommendations for Other Services       Precautions / Restrictions Precautions Precautions: None Restrictions Weight Bearing Restrictions: No      Mobility Bed Mobility Overal bed mobility: Modified Independent                Transfers Overall transfer level: Modified independent                    Balance Overall balance assessment: Needs assistance   Sitting balance-Leahy Scale: Good     Standing balance support: No upper extremity supported Standing balance-Leahy Scale: Good                ADL Overall ADL's : Needs assistance/impaired Eating/Feeding: Set up;Sitting Eating/Feeding Details (indicate cue type and reason): uses R hand, issued foam build up and encouraged pt to use L for thick foods and finger foods Grooming: Wash/dry hands;Standing;Supervision/safety   Upper Body Bathing: Minimal assistance;Sitting   Lower Body Bathing: Supervison/ safety;Sit to/from stand   Upper Body Dressing : Set up;Sitting   Lower Body Dressing: Minimal assistance;Sit to/from stand    Toilet Transfer: Supervision/safety;Ambulation (for management of IV pole)   Toileting- Clothing Manipulation and Hygiene: Supervision/safety;Sit to/from stand       Functional mobility during ADLs: Supervision/safety (to manage IV pole) General ADL Comments: Educated pt and wife in compensatory strategies for ADL and safety issues related to impairment of sensation in L UE. Pt educated in use of theraputty and foam build ups and to use L UE as much as possible and safe in daily living.     Vision     Perception     Praxis      Pertinent Vitals/Pain Pain Assessment: No/denies pain     Hand Dominance Left   Extremity/Trunk Assessment Upper Extremity Assessment Upper Extremity Assessment: RUE deficits/detail;LUE deficits/detail RUE Deficits / Details: h/o rotator cuff repair LUE Deficits / Details: 4+/5 strength, +drift, requires visual monitoring for functional use LUE Sensation: decreased light touch;decreased proprioception (decreased pain, "arm feels like someone elses") LUE Coordination: decreased fine motor;decreased gross motor   Lower Extremity Assessment Lower Extremity Assessment: Defer to PT evaluation LLE Deficits / Details: some slight decreased strength compared to R   Cervical / Trunk Assessment Cervical / Trunk Assessment: Kyphotic   Communication Communication Communication: No difficulties   Cognition Arousal/Alertness: Awake/alert Behavior During Therapy: WFL for tasks assessed/performed Overall Cognitive Status: Within Functional Limits for tasks assessed                 General Comments: some decreased left side awareness   General Comments  Exercises       Shoulder Instructions      Home Living Family/patient expects to be discharged to:: Private residence Living Arrangements: Spouse/significant other Available Help at Discharge: Family Type of Home: House Home Access: Stairs to enter Technical brewer of Steps:  4 Entrance Stairs-Rails: None Home Layout: Two level Alternate Level Stairs-Number of Steps: flight Alternate Level Stairs-Rails: Right Bathroom Shower/Tub: Teacher, early years/pre: Standard     Home Equipment: Radio producer - single point      Lives With: Spouse    Prior Functioning/Environment Level of Independence: Independent        Comments: works out at gym        OT Problem List: Decreased strength;Decreased coordination;Decreased safety awareness;Impaired sensation;Impaired UE functional use   OT Treatment/Interventions: Self-care/ADL training;Patient/family education;Therapeutic activities;Neuromuscular education    OT Goals(Current goals can be found in the care plan section) Acute Rehab OT Goals Patient Stated Goal: to regain use of LUE OT Goal Formulation: With patient Time For Goal Achievement: 03/07/16 Potential to Achieve Goals: Good ADL Goals Pt Will Perform Eating: with set-up;sitting;with adaptive utensils (pt will eat 50% of meal with L hand and foam build up) Pt Will Perform Grooming: with modified independence;standing (including opening containers) Pt/caregiver will Perform Home Exercise Program: Left upper extremity;With theraputty;Independently Additional ADL Goal #1: Pt will be knowlegeable in compensatory strategies and safety related to impaired sensation L UE.  OT Frequency: Min 3X/week   Barriers to D/C:            Co-evaluation              End of Session Equipment Utilized During Treatment: Gait belt Nurse Communication:  (pt could be independent in mobility without IV)  Activity Tolerance: Patient tolerated treatment well Patient left: in bed;with call bell/phone within reach;with family/visitor present   Time: 1514-1600 OT Time Calculation (min): 46 min Charges:  OT General Charges $OT Visit: 1 Procedure OT Evaluation $OT Eval Low Complexity: 1 Procedure OT Treatments $Self Care/Home Management : 8-22  mins $Neuromuscular Re-education: 8-22 mins G-Codes: OT G-codes **NOT FOR INPATIENT CLASS** Functional Assessment Tool Used: clinical judgement Functional Limitation: Self care Self Care Current Status [id number]): At least 20 percent but less than 40 percent impaired, limited or restricted Self Care Goal Status [id number]): At least 1 percent but less than 20 percent impaired, limited or restricted  Malka So 02/29/2016, 4:18 PM  [phone]

## 2016-02-29 NOTE — Evaluation (Signed)
Speech Language Pathology Evaluation Patient Details Name: Ricky [name]. MRN: [id number] DOB: [birth year] Today's Date: 02/29/2016 Time: [id number] SLP Time Calculation (min) (ACUTE ONLY): 25 min  Problem List:  Patient Active Problem List   Diagnosis Date Noted  . Acute ischemic stroke ([place]) 02/28/2016  . Essential hypertension 12/20/2014  . OA (osteoarthritis) of knee 12/14/2013   Past Medical History:  Past Medical History:  Diagnosis Date  . Arthritis   . Hypertension    Past Surgical History:  Past Surgical History:  Procedure Laterality Date  . SHOULDER SURGERY     left- injections intermittent now  . TONSILLECTOMY    . TOTAL KNEE ARTHROPLASTY Left 12/14/2013   Procedure: LEFT TOTAL KNEE ARTHROPLASTY;  Surgeon: Gearlean Alf, MD;  Location: [facility];  Service: Orthopedics;  Laterality: Left;   HPI:  71 year old male admitted 02/28/16 with left side numbness and decreased coordination. PMH significant for OA and HTN. MRI revealed R MCA infarct involving the right insula and right fronto-parietal lobes   Assessment / Plan / Recommendation Clinical Impression  The Montreal Cognitive Assessment (MoCA) was administered. Pt scored within normal limits with a score of 28/30 (n=26+/30) based on this assessment and pt level of education Veterinary surgeon). 2 points lost on delayed recall subtest, with recall of 3/5 unrelated words after 10 minute delay. Oral motor evaluation reveals minimal lower left labial weakness, noted during labial retraction, however, speech is fully intelligible, and pt has not noticed any difference. Pt noted to have residue on upper teeth from breakfast. He was encouraged to check oral cavity after meals (either with lingual sweep or brushing his teeth) to clear left residue, thereby reducing risk of aspiration and/or infection. No further ST intervention is recommended at this time, however, pt was encouraged to notify MD if cognitive difficulty or changes  in function appear once he returns to daily routine, as OP ST may be beneficial.     SLP Assessment  Patient does not need any further Speech Lanaguage Pathology Services    Follow Up Recommendations  None    Frequency and Duration   n/a        SLP Evaluation Cognition  Overall Cognitive Status: Within Functional Limits for tasks assessed Arousal/Alertness: Awake/alert Orientation Level: Oriented X4 Attention: Focused;Sustained;Selective Focused Attention: Appears intact Sustained Attention: Appears intact Selective Attention: Appears intact Memory: Appears intact Awareness: Appears intact Problem Solving: Appears intact Executive Function: Reasoning;Sequencing;Organizing Reasoning: Appears intact Sequencing: Appears intact Organizing: Appears intact Safety/Judgment: Appears intact       Comprehension  Auditory Comprehension Overall Auditory Comprehension: Appears within functional limits for tasks assessed    Expression Expression Primary Mode of Expression: Verbal Verbal Expression Overall Verbal Expression: Appears within functional limits for tasks assessed   Oral / Motor  Oral Motor/Sensory Function Overall Oral Motor/Sensory Function: Mild impairment Facial ROM: Within Functional Limits Facial Symmetry: Abnormal symmetry left Facial Strength: Within Functional Limits Facial Sensation: Within Functional Limits Lingual ROM: Within Functional Limits Lingual Symmetry: Within Functional Limits Lingual Strength: Within Functional Limits Lingual Sensation: Within Functional Limits Mandible: Within Functional Limits Motor Speech Overall Motor Speech: Appears within functional limits for tasks assessed Intelligibility: Intelligible   GO          Functional Assessment Tool Used: asha noms, clinical judgment, MoCA (28/30) Functional Limitations: Memory Memory Current Status [id number]): At least 1 percent but less than 20 percent impaired, limited or  restricted Memory Goal Status [id number]): At least 1 percent but less than 20  percent impaired, limited or restricted Memory Discharge Status [phone]): At least 1 percent but less than 20 percent impaired, limited or restricted         Celia B. [place], [facility], [place]  Shonna Chock 02/29/2016, 11:22 AM

## 2016-02-29 NOTE — Progress Notes (Signed)
Pt returned from TEE at this time.

## 2016-02-29 NOTE — Consult Note (Signed)
Hospital Consult    Reason for Consult:  CVA with carotid artery stenosis Referring Physician:  Neurology MRN #:  [id number]  History of Present Illness: This is a 71 y.o. male who presented to the ED yesterday with c/o left arm/hand clumsiness.  He states that he went to the gym and did his regular workout then went to the store and then home.  Once he was home, he noticed his left hand was clumsy .  He states he was dropping things and unable to hold on to objects.  He states that this lasted several hours.  He states he had some left lower extremity weakness as well.  He went to his PCP and he sent him to the hospital.  He states he continues to have numbness in his hand and still with difficulty with holding objects.  He denies any speech difficulties, temporary blindness or visual disturbances.  He did not have any trouble walking or issues with balance.  He is left hand dominant.   He denies any hx of irregular heart beat or palpitations.  His surgical hx consists of bilateral rotator cuff repair and left knee replacement.   He was on a beta blocker for hypertension prior to admission.  He states that the dose of the medication was cut in half this past December.   Past Medical History:  Diagnosis Date  . Arthritis   . Hypertension     Past Surgical History:  Procedure Laterality Date  . SHOULDER SURGERY     left- injections intermittent now  . TONSILLECTOMY    . TOTAL KNEE ARTHROPLASTY Left 12/14/2013   Procedure: LEFT TOTAL KNEE ARTHROPLASTY;  Surgeon: Gearlean Alf, MD;  Location: [facility];  Service: Orthopedics;  Laterality: Left;    Allergies  Allergen Reactions  . Amoxicillin Rash    Prior to Admission medications   Medication Sig Start Date End Date Taking? Authorizing Provider  acetaminophen (TYLENOL) 500 MG tablet Take 500 mg by mouth every 6 (six) hours as needed.   Yes Historical Provider, MD  atenolol (TENORMIN) 25 MG tablet Take 1 tablet (25 mg total) by mouth  daily. 01/10/16  Yes Marin Olp, MD  Multiple Vitamin (MULTIVITAMIN) tablet Take 1 tablet by mouth daily.   Yes Historical Provider, MD    Social History   Social History  . Marital status: Married    Spouse name: N/A  . Number of children: N/A  . Years of education: N/A   Occupational History  . Not on file.   Social History Main Topics  . Smoking status: Never Smoker  . Smokeless tobacco: Never Used  . Alcohol use No     Comment: former drinker  . Drug use: No  . Sexual activity: Yes   Other Topics Concern  . Not on file   Social History Narrative   Married. 1 son. 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       Hobbies: gym daily- riding exercise bike plus weights/machines, photography- used to do a lot of black and white, gardening     Family History  Problem Relation Age of Onset  . Heart disease Father     in 48s- specifics unclear  . Other Mother     passed in late 11s    ROS: [x]  Positive   [ ]  Negative   [ ]  All sytems reviewed and are negative  Cardiovascular: []  chest pain/pressure []   palpitations []  SOB lying flat []  DOE []  pain in legs while walking []  pain in legs at rest []  pain in legs at night []  non-healing ulcers []  hx of DVT []  swelling in legs  Pulmonary: []  productive cough []  asthma/wheezing []  home O2  Neurologic: [x]  weakness in [x]  left hand/arm [x]  left leg [x]  decreased sensation left hand [x]  hx of CVA []  mini stroke [] difficulty speaking or slurred speech []  temporary loss of vision in one eye []  dizziness  Hematologic: []  hx of cancer []  bleeding problems []  problems with blood clotting easily  Endocrine:   []  diabetes []  thyroid disease  GI []  vomiting blood []  blood in stool  GU: []  CKD/renal failure []  HD--[]  M/W/F or []  T/T/S []  burning with urination []  blood in urine  Psychiatric: []  anxiety []   depression  Musculoskeletal: [x]  arthritis []  joint pain  Integumentary: []  rashes []  ulcers  Constitutional: []  fever []  chills   Physical Examination  Vitals:   02/29/16 1319 02/29/16 1415  BP: 119/69 128/71  Pulse: (!) 56 (!) 54  Resp: 18 18  Temp: 99.3 F (37.4 C) 98.8 F (37.1 C)   Body mass index is 23.24 kg/m.  General:  WDWN in NAD Gait: Not observed HENT: WNL, normocephalic Pulmonary: normal non-labored breathing, without Rales, rhonchi,  wheezing Cardiac: regular, without  Murmurs, rubs or gallops; without carotid bruits Abdomen:  soft, NT/ND, no masses Skin: without rashes Vascular Exam/Pulses:  Right Left  Radial 2+ (normal) 2+ (normal)  Ulnar Unable to palpate  Unable to palpate   DP 2+ (normal) 2+ (normal)  PT Unable to palpate  Unable to palpate    Extremities: without ischemic changes, without Gangrene , without cellulitis; without open wounds;  Musculoskeletal: no muscle wasting or atrophy  Neurologic: A&O X 3;  No focal weakness or paresthesias are detected; speech is fluent/normal Psychiatric:  The pt has Normal affect.   CBC    Component Value Date/Time   WBC 4.4 02/28/2016 1701   RBC 4.26 02/28/2016 1701   HGB 13.6 02/28/2016 1709   HCT 40.0 02/28/2016 1709   PLT 210 02/28/2016 1701   MCV 92.5 02/28/2016 1701   MCH 31.5 02/28/2016 1701   MCHC 34.0 02/28/2016 1701   RDW 12.9 02/28/2016 1701   LYMPHSABS 1.0 02/28/2016 1701   MONOABS 0.4 02/28/2016 1701   EOSABS 0.1 02/28/2016 1701   BASOSABS 0.0 02/28/2016 1701    BMET    Component Value Date/Time   NA 142 02/28/2016 1709   K 3.8 02/28/2016 1709   CL 103 02/28/2016 1709   CO2 26 02/28/2016 1701   GLUCOSE 122 (H) 02/28/2016 1709   BUN 18 02/28/2016 1709   CREATININE 0.80 02/28/2016 1709   CALCIUM 9.0 02/28/2016 1701   GFRNONAA >60 02/28/2016 1701   GFRAA >60 02/28/2016 1701    COAGS: Lab Results  Component Value Date   INR 1.02 02/28/2016   INR 0.99 12/07/2013      Non-Invasive Vascular Imaging:   Carotid duplex 02/29/16: Carotid Duplex (Doppler) has been completed.  Preliminary findings: Right 40-59% ICA stenosis, high end of scale. Left 1-39% ICA stenosis. Antegrade vertebral flow.  MRI 02/28/16: 1. Acute moderate-size right MCA territory infarct involving the right insula as well as the right frontal and parietal lobes as above. No associated mass effect or hemorrhage. 2. Otherwise negative MRI of the brain. MRA HEAD IMPRESSION:  1. Occluded distal right M3 branch, compatible with acute right MCA infarction. 2. Hypoplastic/absent right  A1 segment with overall diminutive right carotid artery system. 3. Otherwise negative MRA of the intracranial circulation. MRA NECK IMPRESSION:  1. Short-segment severe stenosis at the origin of the right ICA (estimated 80-90%). Stenosis begins at the bifurcation and measures approximately 5 mm in length. 2. Widely patent left carotid artery system. 3. Widely patent vertebral arteries within the neck.  TTE 02/29/16: - Left ventricle: The cavity size was normal. Wall thickness was   normal. Systolic function was normal. The estimated ejection   fraction was in the range of 60% to 65%. Wall motion was normal;   there were no regional wall motion abnormalities. Doppler   parameters are consistent with abnormal left ventricular   relaxation (grade 1 diastolic dysfunction). The E/e&' ratio is <8,   suggesting normal LV filling pressure. 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 There was mild regurgitation. - Left atrium: The atrium was normal in size. - Tricuspid valve: There was mild regurgitation. - Pulmonary arteries: PA peak pressure: 32 mm Hg (S). - Inferior vena cava: The vessel was normal in size. The   respirophasic diameter changes were in the normal range (>= 50%),   consistent with normal central venous pressure.  Statin:  Yes.    this admission Beta Blocker:  Yes.   Aspirin:  Yes.   this admission ACEI:  No. ARB:  No. CCB use:  No Other antiplatelets/anticoagulants:  No.    ASSESSMENT/PLAN: This is a 71 y.o. male with symptomatic right carotid artery stenosis   -pt continues to have fine motor deficit of left hand; motor is in tact -carotid duplex reveals 40-59% right carotid artery stenosis, however MRI reveals a short segment of severe stenosis at the origin of the right ICA that begins at the bifurcation and measures 7mm in length.   -pt has been started on statin/aspirin  -Dr. Oneida Alar will be in to see the pt this afternoon    Leontine Locket, PA-C [facility] [phone]   History and exam findings as above.  Acute CVA most likely secondary to right ICA stenosis.  Has some clumsiness to right hand otherwise no deficits.  He has been started on ASA/statin.  He has good exercise tolerance with no chest pain or dyspnea so I do not believe he needs preop cardiac workup.  Benefit with CEA over 50% stenosis so discrepancy between MRI and [place] not really relevant.  Risk benefit possible complications discussed with pt including but not limited to stroke 3% MI 5% bleeding infection 1% cranial nerve injury 10%.  I will schedule him for Monday January 29.  Can be dc'd to home in meanwhile from my standpoint if BP control is reasonable.  Ruta Hinds, MD [facility] [place] Office: [phone] Pager: [phone]

## 2016-02-29 NOTE — Progress Notes (Signed)
Unable to do admission health history will defer to next shift.

## 2016-02-29 NOTE — Evaluation (Signed)
Physical Therapy Evaluation & Discharge Patient Details Name: Zayaan Sanghera. MRN: [id number] DOB: 04-16-1945 Today's Date: 02/29/2016   History of Present Illness  Patient is a 71 y/o male admitted with decreased coordination and decreased sensation in his left hand.  MRI positive for R MCA CVA.  H/o TKA, shoulder surgery and HTN.  Clinical Impression  Patient presents with L UE symptoms of coordination and strength.  Feel though some L LE weakness no issues with mobility at this time (scored 24.24 on DGI).  Did educate on fall prevention, and safety for home as well as on gradual return to activity (especially gym).  Will sign off at this time, anticipate he may have follow up OT needs.     Follow Up Recommendations No PT follow up    Equipment Recommendations  None recommended by PT    Recommendations for Other Services       Precautions / Restrictions Precautions Precautions: None      Mobility  Bed Mobility Overal bed mobility: Modified Independent                Transfers Overall transfer level: Modified independent                  Ambulation/Gait Ambulation/Gait assistance: Independent Ambulation Distance (Feet): 200 Feet Assistive device: None Gait Pattern/deviations: Step-through pattern     General Gait Details: completed DGI without difficulty, but note some L side pelvic retraction at times.  Stairs Stairs: Yes Stairs assistance: Supervision Stair Management: No rails;Alternating pattern Number of Stairs: 3 General stair comments: for safety  Wheelchair Mobility    Modified Rankin (Stroke Patients Only) Modified Rankin (Stroke Patients Only) Pre-Morbid Rankin Score: No symptoms Modified Rankin: Moderate disability     Balance Overall balance assessment: Needs assistance   Sitting balance-Leahy Scale: Good     Standing balance support: No upper extremity supported Standing balance-Leahy Scale: Good     Single Leg Stance  - Left Leg: 7       Rhomberg - Eyes Closed: 30     Standardized Balance Assessment Standardized Balance Assessment : Dynamic Gait Index   Dynamic Gait Index Level Surface: Normal Change in Gait Speed: Normal Gait with Horizontal Head Turns: Normal Gait with Vertical Head Turns: Normal Gait and Pivot Turn: Normal Step Over Obstacle: Normal Step Around Obstacles: Normal Steps: Normal Total Score: 24       Pertinent Vitals/Pain Pain Assessment: No/denies pain    Home Living Family/patient expects to be discharged to:: Private residence Living Arrangements: Spouse/significant other Available Help at Discharge: Family Type of Home: House Home Access: Stairs to enter Entrance Stairs-Rails: None Technical brewer of Steps: 4 Home Layout: Two level Home Equipment: Cane - single point      Prior Function Level of Independence: Independent         Comments: works out at [employer]   Dominant Hand: Left    Extremity/Trunk Assessment   Upper Extremity Assessment Upper Extremity Assessment: LUE deficits/detail LUE Deficits / Details: decreased coordination with finger to nose and increased effort for touching each finger with thumb, noted decreased grip compared to R.    Lower Extremity Assessment Lower Extremity Assessment: LLE deficits/detail LLE Deficits / Details: some slight decreased strength compared to R    Cervical / Trunk Assessment Cervical / Trunk Assessment: Kyphotic  Communication   Communication: No difficulties  Cognition Arousal/Alertness: Awake/alert Behavior During Therapy: WFL for tasks assessed/performed Overall Cognitive Status: Impaired/Different  from baseline                 General Comments: some decreased left side awareness    General Comments General comments (skin integrity, edema, etc.): still educated on home safety for fall prevention as well as in very slow gradual return to activity    Exercises      Assessment/Plan    PT Assessment Patent does not need any further PT services  PT Problem List            PT Treatment Interventions      PT Goals (Current goals can be found in the Care Plan section)  Acute Rehab PT Goals PT Goal Formulation: All assessment and education complete, DC therapy    Frequency     Barriers to discharge        Co-evaluation               End of Session Equipment Utilized During Treatment: Gait belt Activity Tolerance: Patient tolerated treatment well Patient left: in bed;with call bell/phone within reach;with family/visitor present      Functional Assessment Tool Used: Clinical Judgement Functional Limitation: Mobility: Walking and moving around Mobility: Walking and Moving Around Current Status [id number]): At least 1 percent but less than 20 percent impaired, limited or restricted Mobility: Walking and Moving Around Goal Status [phone]): At least 1 percent but less than 20 percent impaired, limited or restricted Mobility: Walking and Moving Around Discharge Status [phone]): At least 1 percent but less than 20 percent impaired, limited or restricted    Time: 1150-1216 PT Time Calculation (min) (ACUTE ONLY): 26 min   Charges:   PT Evaluation $PT Eval Moderate Complexity: 1 Procedure PT Treatments $Self Care/Home Management: 8-22   PT G Codes:   PT G-Codes **NOT FOR INPATIENT CLASS** Functional Assessment Tool Used: Clinical Judgement Functional Limitation: Mobility: Walking and moving around Mobility: Walking and Moving Around Current Status [id number]): At least 1 percent but less than 20 percent impaired, limited or restricted Mobility: Walking and Moving Around Goal Status [phone]): At least 1 percent but less than 20 percent impaired, limited or restricted Mobility: Walking and Moving Around Discharge Status [phone]): At least 1 percent but less than 20 percent impaired, limited or restricted    Reginia Naas 02/29/2016, 1:25 PM   Magda Kiel, [place] 02/29/2016

## 2016-02-29 NOTE — Progress Notes (Signed)
Patient arrived to unit 5 m bed 19 from emergency department.Assited patient to bed by nursing staff.Oriented patient to nursing unit,call bell, and being high fall risk.Yellow bracelet and socks placed on patient and bed alarm set for safety.

## 2016-02-29 NOTE — Progress Notes (Signed)
PROGRESS NOTE    Ricky [name].  [id number] DOB: 1945-05-03 DOA: 02/28/2016 PCP: Garret Reddish, MD    Brief Narrative:  Patient is a 71 year old gentleman history of hypertension, osteoarthritis at his baseline state of health on the morning of admission when he returned from the gym around 11:30 AM when he developed acute onset decreased coordination and decreased sensation in his left hand. Patient was brought to the ED. MRI of the brain consistent with an acute stroke. Neurology following. Vascular surgery follow-up.   Assessment & Plan:   Principal Problem:   Acute ischemic stroke [facility]) Active Problems:   Occlusion and stenosis of carotid artery   Essential hypertension  #1 acute ischemic stroke/Acute moderate sized R MCA infarction The MRI of head that showed an acute moderate sized right MCA territory infarct involving the right insula as well as right frontal and parietal lobes. MRA with an occluded distal right M3 branch compatible with acute right MCA infarction. Short segment of severe stenosis at the origin of the right ICA 80-90% is seen on MRA of the neck. 2-D echo with no source of emboli. Carotid Dopplers with preliminary findings of right 40-59% ICA stenosis, left 1-39% ICA stenosis antegrade vertebral flow. Patient currently on aspirin for secondary stroke prevention. Fasting lipid panel done 01/10/2016 with a LDL of 75. Goal LDL less than 70. Patient started on a statin. Vascular consultation pending for further evaluation of short segment of severe stenosis at the origin of the right ICA. Neurology following and appreciate input and recommendations. PT/OT.  #2 hypertension Stable. Permissive hypertension. Gradually normalizing 5-7 [name]. Blood pressure currently stable. Follow.  #3 right internal carotid artery stenosis Likely symptomatic and etiology of patient's stroke. Vascular surgery has been consulted and following. Area of #4   DVT prophylaxis:  SCDs Code Status: Full Family Communication: Updated patient and wife at bedside. Disposition Plan: Home once stroke workup is completed and per vascular surgery and neurology.   Consultants:   Neurology: Dr. Shon Hale 02/28/2016  Vascular surgery pending    Procedures:   MRI brain 02/28/2016  MRA neck 02/28/2016  MRA head 02/28/2016 2-D echo 02/29/2016  Carotid Dopplers 02/29/2016  CT head 02/28/2016    Antimicrobials:   None   Subjective: Patient with complaints of some numbness and tingling in the left hand as well as decrease in fine motor coordination in his left hand. No chest pain. No shortness of breath.  Objective: Vitals:   02/29/16 1002 02/29/16 1319 02/29/16 1415 02/29/16 1620  BP: (!) 146/80 119/69 128/71 138/72  Pulse: (!) 58 (!) 56 (!) 54 (!) 51  Resp: 18 18 18 18   Temp: 99.3 F (37.4 C) 99.3 F (37.4 C) 98.8 F (37.1 C) 97.2 F (36.2 C)  TempSrc: Oral Oral Oral Oral  SpO2: 99% 95% 100% 98%  Weight:      Height:        Intake/Output Summary (Last 24 hours) at 02/29/16 1652 Last data filed at 02/29/16 0600  Gross per 24 hour  Intake           463.75 ml  Output                0 ml  Net           463.75 ml   Filed Weights   02/28/16 2115  Weight: 64.3 kg (141 lb 12.8 oz)    Examination:  General exam: Appears calm and comfortable  Respiratory system: Clear to auscultation. Respiratory effort normal.  Cardiovascular system: S1 & S2 heard, RRR. No JVD, murmurs, rubs, gallops or clicks. No pedal edema. Gastrointestinal system: Abdomen is nondistended, soft and nontender. No organomegaly or masses felt. Normal bowel sounds heard. Central nervous system: Alert and oriented. No focal neurological deficits. Extremities: Symmetric 5 x 5 power. Skin: No rashes, lesions or ulcers Psychiatry: Judgement and insight appear normal. Mood & affect appropriate.     Data Reviewed: I have personally reviewed following labs and imaging  studies  CBC:  Recent Labs Lab 02/28/16 1701 02/28/16 1709  WBC 4.4  --   NEUTROABS 3.1  --   HGB 13.4 13.6  HCT 39.4 40.0  MCV 92.5  --   PLT 210  --    Basic Metabolic Panel:  Recent Labs Lab 02/28/16 1701 02/28/16 1709  NA 139 142  K 4.0 3.8  CL 106 103  CO2 26  --   GLUCOSE 126* 122*  BUN 17 18  CREATININE 0.79 0.80  CALCIUM 9.0  --    GFR: Estimated Creatinine Clearance: 74 mL/min (by C-G formula based on SCr of 0.8 mg/dL). Liver Function Tests:  Recent Labs Lab 02/28/16 1701  AST 37  ALT 37  ALKPHOS 51  BILITOT 0.5  PROT 6.4*  ALBUMIN 3.7   No results for input(s): LIPASE, AMYLASE in the last 168 hours. No results for input(s): AMMONIA in the last 168 hours. Coagulation Profile:  Recent Labs Lab 02/28/16 1701  INR 1.02   Cardiac Enzymes: No results for input(s): CKTOTAL, CKMB, CKMBINDEX, TROPONINI in the last 168 hours. BNP (last 3 results) No results for input(s): PROBNP in the last 8760 hours. HbA1C: No results for input(s): HGBA1C in the last 72 hours. CBG: No results for input(s): GLUCAP in the last 168 hours. Lipid Profile: No results for input(s): CHOL, HDL, LDLCALC, TRIG, CHOLHDL, LDLDIRECT in the last 72 hours. Thyroid Function Tests: No results for input(s): TSH, T4TOTAL, FREET4, T3FREE, THYROIDAB in the last 72 hours. Anemia Panel: No results for input(s): VITAMINB12, FOLATE, FERRITIN, TIBC, IRON, RETICCTPCT in the last 72 hours. Sepsis Labs: No results for input(s): PROCALCITON, LATICACIDVEN in the last 168 hours.  No results found for this or any previous visit (from the past 240 hour(s)).       Radiology Studies: Mr Jodene Nam Neck W Wo Contrast  Result Date: 02/28/2016 CLINICAL DATA:  Initial evaluation for acute left hand weakness. Concern for stroke. EXAM: MRI HEAD WITHOUT AND WITH CONTRAST MRA HEAD WITHOUT CONTRAST MRA NECK WITHOUT AND WITH CONTRAST TECHNIQUE: Multiplanar, multiecho pulse sequences of the brain and  surrounding structures were obtained without and with intravenous contrast. Angiographic images of the Circle of Willis were obtained using MRA technique without intravenous contrast. Angiographic images of the neck were obtained using MRA technique without and with intravenous contrast. Carotid stenosis measurements (when applicable) are obtained utilizing NASCET criteria, using the distal internal carotid diameter as the denominator. CONTRAST:  68mL MULTIHANCE GADOBENATE DIMEGLUMINE 529 MG/ML IV SOLN COMPARISON:  Prior CT from earlier the same day. FINDINGS: MRI HEAD FINDINGS Brain: Cerebral volume within normal limits for patient age. No significant cerebral white matter changes identified. There is confluent restricted diffusion involving the posterior right insular cortex, with extension into the posterior right frontal and parietal regions, compatible with acute right MCA territory infarct. Infarct is largely cortical in nature. Localized early gyral swelling and edema without significant mass effect. Susceptibility artifact within the right sylvian fissure likely reflects thrombus within the distal right M3 branch (series 10,  image 56). No evidence for associated hemorrhage within the infarct itself. No other evidence for prior or chronic infarction. No mass lesion, midline shift, or mass effect. Ventricles are normal in size without evidence for hydrocephalus. No extra-axial fluid collection. Major dural sinuses are patent. No abnormal enhancement. Pituitary gland and suprasellar region within normal limits. Midline structures intact and normal. Vascular: Major intracranial vascular flow voids are maintained. Probable thrombus within a distal right M3 branch as above. Skull and upper cervical spine: Craniocervical junction normal. Bone marrow signal intensity within normal limits. No acute scalp soft tissue abnormality. T1/T2 hypointense nonenhancing lesion within the left parietal scalp noted, of doubtful  clinical significance. Sinuses/Orbits: Globes and orbital soft tissues within normal limits. Scattered mucosal thickening throughout the maxillary sinuses and ethmoidal air cells. No air-fluid level to suggest active sinus infection. No mastoid effusion. Inner ear structures within normal limits. MRA HEAD FINDINGS ANTERIOR CIRCULATION: Distal cervical segments of the internal carotid arteries are widely patent with antegrade flow. Right ICA is diminutive as compared to the left. Petrous, cavernous, and supraclinoid segments widely patent bilaterally. Left A1 segment widely patent. Right A1 segment hypoplastic/absent, which likely accounts for the diminutive right ICA. Anterior communicating artery normal. Anterior cerebral arteries widely patent to their distal aspects. M1 segments patent bilaterally without stenosis or occlusion. MCA bifurcations normal. No proximal M2 occlusion. Probable right M3 the occlusion noted, correlating with susceptibility artifact seen on brain MRI (series 4, image 128). MCA branches otherwise widely patent bilaterally. POSTERIOR CIRCULATION: Vertebral arteries code dominant and patent to the vertebrobasilar junction. Posterior inferior cerebral arteries patent proximally. Basilar artery widely patent. Superior cerebral arteries patent bilaterally. Both of the posterior cerebral artery supplied via the basilar and are widely patent to their distal aspects. No aneurysm or vascular malformation. MRA NECK FINDINGS Visualized aortic arch of normal caliber with normal 3 vessel morphology. No high-grade stenosis at the origin of the great vessels. Visualized subclavian arteries are widely patent. Right common carotid artery widely patent from its origin to the bifurcation. There is a severe short-segment atheromatous stenosis at the proximal right ICA (estimated 80-90%). Stenosis begins at the bifurcation, and measures approximately 5 mm in length (series 180152, image 4). Right ICA and patent  distally to the circle of Willis without additional stenosis. Overall, right ICA is diminutive as compared to the left, likely due to hypoplastic a 1 segment. Left common carotid artery patent from its origin to the bifurcation. Mild atheromatous irregularity about the left bifurcation without flow-limiting stenosis. Left ICA widely patent distally to the skullbase without stenosis or occlusion. Both of the vertebral arteries arise from the subclavian arteries. Vertebral arteries are largely code dominant and widely patent within the neck without stenosis, dissection, or occlusion. IMPRESSION: MRI HEAD IMPRESSION: 1. Acute moderate-size right MCA territory infarct involving the right insula as well as the right frontal and parietal lobes as above. No associated mass effect or hemorrhage. 2. Otherwise negative MRI of the brain. MRA HEAD IMPRESSION: 1. Occluded distal right M3 branch, compatible with acute right MCA infarction. 2. Hypoplastic/absent right A1 segment with overall diminutive right carotid artery system. 3. Otherwise negative MRA of the intracranial circulation. MRA NECK IMPRESSION: 1. Short-segment severe stenosis at the origin of the right ICA (estimated 80-90%). Stenosis begins at the bifurcation and measures approximately 5 mm in length. 2. Widely patent left carotid artery system. 3. Widely patent vertebral arteries within the neck. Electronically Signed   By: Jeannine Boga M.D.   On: 02/28/2016  21:37   Mr Jeri Cos [id number] Contrast  Result Date: 02/28/2016 CLINICAL DATA:  Initial evaluation for acute left hand weakness. Concern for stroke. EXAM: MRI HEAD WITHOUT AND WITH CONTRAST MRA HEAD WITHOUT CONTRAST MRA NECK WITHOUT AND WITH CONTRAST TECHNIQUE: Multiplanar, multiecho pulse sequences of the brain and surrounding structures were obtained without and with intravenous contrast. Angiographic images of the Circle of Willis were obtained using MRA technique without intravenous contrast.  Angiographic images of the neck were obtained using MRA technique without and with intravenous contrast. Carotid stenosis measurements (when applicable) are obtained utilizing NASCET criteria, using the distal internal carotid diameter as the denominator. CONTRAST:  20mL MULTIHANCE GADOBENATE DIMEGLUMINE 529 MG/ML IV SOLN COMPARISON:  Prior CT from earlier the same day. FINDINGS: MRI HEAD FINDINGS Brain: Cerebral volume within normal limits for patient age. No significant cerebral white matter changes identified. There is confluent restricted diffusion involving the posterior right insular cortex, with extension into the posterior right frontal and parietal regions, compatible with acute right MCA territory infarct. Infarct is largely cortical in nature. Localized early gyral swelling and edema without significant mass effect. Susceptibility artifact within the right sylvian fissure likely reflects thrombus within the distal right M3 branch (series 10, image 56). No evidence for associated hemorrhage within the infarct itself. No other evidence for prior or chronic infarction. No mass lesion, midline shift, or mass effect. Ventricles are normal in size without evidence for hydrocephalus. No extra-axial fluid collection. Major dural sinuses are patent. No abnormal enhancement. Pituitary gland and suprasellar region within normal limits. Midline structures intact and normal. Vascular: Major intracranial vascular flow voids are maintained. Probable thrombus within a distal right M3 branch as above. Skull and upper cervical spine: Craniocervical junction normal. Bone marrow signal intensity within normal limits. No acute scalp soft tissue abnormality. T1/T2 hypointense nonenhancing lesion within the left parietal scalp noted, of doubtful clinical significance. Sinuses/Orbits: Globes and orbital soft tissues within normal limits. Scattered mucosal thickening throughout the maxillary sinuses and ethmoidal air cells. No  air-fluid level to suggest active sinus infection. No mastoid effusion. Inner ear structures within normal limits. MRA HEAD FINDINGS ANTERIOR CIRCULATION: Distal cervical segments of the internal carotid arteries are widely patent with antegrade flow. Right ICA is diminutive as compared to the left. Petrous, cavernous, and supraclinoid segments widely patent bilaterally. Left A1 segment widely patent. Right A1 segment hypoplastic/absent, which likely accounts for the diminutive right ICA. Anterior communicating artery normal. Anterior cerebral arteries widely patent to their distal aspects. M1 segments patent bilaterally without stenosis or occlusion. MCA bifurcations normal. No proximal M2 occlusion. Probable right M3 the occlusion noted, correlating with susceptibility artifact seen on brain MRI (series 4, image 128). MCA branches otherwise widely patent bilaterally. POSTERIOR CIRCULATION: Vertebral arteries code dominant and patent to the vertebrobasilar junction. Posterior inferior cerebral arteries patent proximally. Basilar artery widely patent. Superior cerebral arteries patent bilaterally. Both of the posterior cerebral artery supplied via the basilar and are widely patent to their distal aspects. No aneurysm or vascular malformation. MRA NECK FINDINGS Visualized aortic arch of normal caliber with normal 3 vessel morphology. No high-grade stenosis at the origin of the great vessels. Visualized subclavian arteries are widely patent. Right common carotid artery widely patent from its origin to the bifurcation. There is a severe short-segment atheromatous stenosis at the proximal right ICA (estimated 80-90%). Stenosis begins at the bifurcation, and measures approximately 5 mm in length (series 180152, image 4). Right ICA and patent distally to the circle of Willis  without additional stenosis. Overall, right ICA is diminutive as compared to the left, likely due to hypoplastic a 1 segment. Left common carotid  artery patent from its origin to the bifurcation. Mild atheromatous irregularity about the left bifurcation without flow-limiting stenosis. Left ICA widely patent distally to the skullbase without stenosis or occlusion. Both of the vertebral arteries arise from the subclavian arteries. Vertebral arteries are largely code dominant and widely patent within the neck without stenosis, dissection, or occlusion. IMPRESSION: MRI HEAD IMPRESSION: 1. Acute moderate-size right MCA territory infarct involving the right insula as well as the right frontal and parietal lobes as above. No associated mass effect or hemorrhage. 2. Otherwise negative MRI of the brain. MRA HEAD IMPRESSION: 1. Occluded distal right M3 branch, compatible with acute right MCA infarction. 2. Hypoplastic/absent right A1 segment with overall diminutive right carotid artery system. 3. Otherwise negative MRA of the intracranial circulation. MRA NECK IMPRESSION: 1. Short-segment severe stenosis at the origin of the right ICA (estimated 80-90%). Stenosis begins at the bifurcation and measures approximately 5 mm in length. 2. Widely patent left carotid artery system. 3. Widely patent vertebral arteries within the neck. Electronically Signed   By: Jeannine Boga M.D.   On: 02/28/2016 21:37   Mr Jodene Nam Headm  Result Date: 02/28/2016 CLINICAL DATA:  Initial evaluation for acute left hand weakness. Concern for stroke. EXAM: MRI HEAD WITHOUT AND WITH CONTRAST MRA HEAD WITHOUT CONTRAST MRA NECK WITHOUT AND WITH CONTRAST TECHNIQUE: Multiplanar, multiecho pulse sequences of the brain and surrounding structures were obtained without and with intravenous contrast. Angiographic images of the Circle of Willis were obtained using MRA technique without intravenous contrast. Angiographic images of the neck were obtained using MRA technique without and with intravenous contrast. Carotid stenosis measurements (when applicable) are obtained utilizing NASCET criteria,  using the distal internal carotid diameter as the denominator. CONTRAST:  62mL MULTIHANCE GADOBENATE DIMEGLUMINE 529 MG/ML IV SOLN COMPARISON:  Prior CT from earlier the same day. FINDINGS: MRI HEAD FINDINGS Brain: Cerebral volume within normal limits for patient age. No significant cerebral white matter changes identified. There is confluent restricted diffusion involving the posterior right insular cortex, with extension into the posterior right frontal and parietal regions, compatible with acute right MCA territory infarct. Infarct is largely cortical in nature. Localized early gyral swelling and edema without significant mass effect. Susceptibility artifact within the right sylvian fissure likely reflects thrombus within the distal right M3 branch (series 10, image 56). No evidence for associated hemorrhage within the infarct itself. No other evidence for prior or chronic infarction. No mass lesion, midline shift, or mass effect. Ventricles are normal in size without evidence for hydrocephalus. No extra-axial fluid collection. Major dural sinuses are patent. No abnormal enhancement. Pituitary gland and suprasellar region within normal limits. Midline structures intact and normal. Vascular: Major intracranial vascular flow voids are maintained. Probable thrombus within a distal right M3 branch as above. Skull and upper cervical spine: Craniocervical junction normal. Bone marrow signal intensity within normal limits. No acute scalp soft tissue abnormality. T1/T2 hypointense nonenhancing lesion within the left parietal scalp noted, of doubtful clinical significance. Sinuses/Orbits: Globes and orbital soft tissues within normal limits. Scattered mucosal thickening throughout the maxillary sinuses and ethmoidal air cells. No air-fluid level to suggest active sinus infection. No mastoid effusion. Inner ear structures within normal limits. MRA HEAD FINDINGS ANTERIOR CIRCULATION: Distal cervical segments of the internal  carotid arteries are widely patent with antegrade flow. Right ICA is diminutive as compared to the left.  Petrous, cavernous, and supraclinoid segments widely patent bilaterally. Left A1 segment widely patent. Right A1 segment hypoplastic/absent, which likely accounts for the diminutive right ICA. Anterior communicating artery normal. Anterior cerebral arteries widely patent to their distal aspects. M1 segments patent bilaterally without stenosis or occlusion. MCA bifurcations normal. No proximal M2 occlusion. Probable right M3 the occlusion noted, correlating with susceptibility artifact seen on brain MRI (series 4, image 128). MCA branches otherwise widely patent bilaterally. POSTERIOR CIRCULATION: Vertebral arteries code dominant and patent to the vertebrobasilar junction. Posterior inferior cerebral arteries patent proximally. Basilar artery widely patent. Superior cerebral arteries patent bilaterally. Both of the posterior cerebral artery supplied via the basilar and are widely patent to their distal aspects. No aneurysm or vascular malformation. MRA NECK FINDINGS Visualized aortic arch of normal caliber with normal 3 vessel morphology. No high-grade stenosis at the origin of the great vessels. Visualized subclavian arteries are widely patent. Right common carotid artery widely patent from its origin to the bifurcation. There is a severe short-segment atheromatous stenosis at the proximal right ICA (estimated 80-90%). Stenosis begins at the bifurcation, and measures approximately 5 mm in length (series 180152, image 4). Right ICA and patent distally to the circle of Willis without additional stenosis. Overall, right ICA is diminutive as compared to the left, likely due to hypoplastic a 1 segment. Left common carotid artery patent from its origin to the bifurcation. Mild atheromatous irregularity about the left bifurcation without flow-limiting stenosis. Left ICA widely patent distally to the skullbase without  stenosis or occlusion. Both of the vertebral arteries arise from the subclavian arteries. Vertebral arteries are largely code dominant and widely patent within the neck without stenosis, dissection, or occlusion. IMPRESSION: MRI HEAD IMPRESSION: 1. Acute moderate-size right MCA territory infarct involving the right insula as well as the right frontal and parietal lobes as above. No associated mass effect or hemorrhage. 2. Otherwise negative MRI of the brain. MRA HEAD IMPRESSION: 1. Occluded distal right M3 branch, compatible with acute right MCA infarction. 2. Hypoplastic/absent right A1 segment with overall diminutive right carotid artery system. 3. Otherwise negative MRA of the intracranial circulation. MRA NECK IMPRESSION: 1. Short-segment severe stenosis at the origin of the right ICA (estimated 80-90%). Stenosis begins at the bifurcation and measures approximately 5 mm in length. 2. Widely patent left carotid artery system. 3. Widely patent vertebral arteries within the neck. Electronically Signed   By: Jeannine Boga M.D.   On: 02/28/2016 21:37   Ct Head Code Stroke W/o Cm  Result Date: 02/28/2016 CLINICAL DATA:  Code stroke. Code stroke. Sudden onset left hand weakness. Loss of cord nasion in the left hand. EXAM: CT HEAD WITHOUT CONTRAST TECHNIQUE: Contiguous axial images were obtained from the base of the skull through the vertex without intravenous contrast. COMPARISON:  None. FINDINGS: Brain: No acute cortical infarct, hemorrhage, or mass lesion is present. The precentral gyrus is intact. No focal cortical lesions are evident. The basal ganglia are within normal limits bilaterally. The insular cortex is normal. Brainstem and cerebellum are normal. Vascular: No hyperdense vessel or unexpected calcification. Skull: The calvarium is intact. No focal lytic or blastic lesions are present. Sinuses/Orbits: Mild mucosal thickening is present within posterior ethmoid air cells bilaterally. The remaining  paranasal sinuses and the mastoid air cells are clear. ASPECTS [facility] Stroke Program Early CT Score) - Ganglionic level infarction (caudate, lentiform nuclei, internal capsule, insula, M1-M3 cortex): 7/7 - Supraganglionic infarction (M4-M6 cortex): 3/3 Total score (0-10 with 10 being normal): 10/10 IMPRESSION: 1.  Normal CT appearance of the brain for age.  No acute infarct. 2. Mild posterior ethmoid sinus disease. 3. ASPECTS is 10/10 These results were called by telephone at the time of interpretation on 02/28/2016 at 5:19 pm to Dr. Shon Hale, who verbally acknowledged these results. Electronically Signed   By: San Morelle M.D.   On: 02/28/2016 17:28        Scheduled Meds: .  stroke: mapping our early stages of recovery book   Does not apply Once  . aspirin EC  81 mg Oral Daily  . atorvastatin  20 mg Oral q1800   Continuous Infusions: . sodium chloride 75 mL/hr at 02/28/16 2349     LOS: 0 [name]    Time spent: 8 mins    Surie Suchocki, MD [facility] Pager [phone]  If 7PM-7AM, please contact night-coverage [email or web address] Password [facility] 02/29/2016, 4:52 PM

## 2016-02-29 NOTE — Progress Notes (Signed)
*  PRELIMINARY RESULTS* Vascular Ultrasound Carotid Duplex (Doppler) has been completed.  Preliminary findings: Right 40-59% ICA stenosis, high end of scale. Left 1-39% ICA stenosis. Antegrade vertebral flow.    Landry Mellow, RDMS, RVT  02/29/2016, 9:20 AM

## 2016-03-01 ENCOUNTER — Other Ambulatory Visit: Payer: Self-pay

## 2016-03-01 LAB — BASIC METABOLIC PANEL
Anion gap: 5 (ref 5–15)
BUN: 13 mg/dL (ref 6–20)
CALCIUM: 8.5 mg/dL — AB (ref 8.9–10.3)
CHLORIDE: 109 mmol/L (ref 101–111)
CO2: 26 mmol/L (ref 22–32)
CREATININE: 0.83 mg/dL (ref 0.61–1.24)
GFR calc non Af Amer: >60 mL/min (ref >60)
Glucose, Bld: 98 mg/dL (ref 65–99)
Potassium: 3.9 mmol/L (ref 3.5–5.1)
SODIUM: 140 mmol/L (ref 135–145)

## 2016-03-01 LAB — HEMOGLOBIN A1C
Hgb A1c MFr Bld: 5.4 % (ref 4.8–5.6)
Mean Plasma Glucose: 108 mg/dL

## 2016-03-01 MED ORDER — ASPIRIN EC 325 MG PO TBEC: 325.0000 mg | DELAYED_RELEASE_TABLET | Freq: Every day | ORAL | 0 refills | Status: DC

## 2016-03-01 MED ORDER — ATORVASTATIN CALCIUM 20 MG PO TABS: 20.0000 mg | ORAL_TABLET | Freq: Every day | ORAL | 3 refills | Status: DC

## 2016-03-01 NOTE — Care Management Note (Signed)
Case Management Note  Patient Details  Name: Ricky [name]. MRN: [id number] Date of Birth: 11-03-1945  Subjective/Objective:                    Action/Plan: Pt discharging home today. CM consulted for outpatient therapy. CM met with the patient and he was interested in attending at [facility]. Orders placed in EPIC and information on the AVS.   Expected Discharge Date:  03/01/16               Expected Discharge Plan:  Home/Self Care  In-House Referral:     Discharge planning Services  CM Consult  Post Acute Care Choice:    Choice offered to:     DME Arranged:    DME Agency:     HH Arranged:    [place] Agency:     Status of Service:  Completed, signed off  If discussed at [employer] of Stay Meetings, dates discussed:    Additional Comments:  Pollie Friar, RN 03/01/2016, 11:38 AM

## 2016-03-01 NOTE — Progress Notes (Signed)
Discharge instructions reviewed with patient and spouse.

## 2016-03-01 NOTE — Consult Note (Signed)
Further discussions with pt this morning We will plan right CEA Feb 12 Call if questions.  Our office will coordinate with the patient  Ricky [name]

## 2016-03-01 NOTE — Progress Notes (Signed)
Occupational Therapy Treatment Patient Details Name: Ricky [name]. MRN: [id number] DOB: [birth year] Today's Date: 03/01/2016    History of present illness Patient is a 71 y/o male admitted with decreased coordination and decreased sensation in his left hand.  MRI positive for R MCA CVA.  H/o TKA, shoulder surgery and HTN.   OT comments  Continued education in safety and compensatory strategies for L UE impairment. Pt is appropriate for outpatient OT.   Follow Up Recommendations  Outpatient OT    Equipment Recommendations  None recommended by OT    Recommendations for Other Services      Precautions / Restrictions Precautions Precautions: None Restrictions Weight Bearing Restrictions: No       Mobility Bed Mobility Overal bed mobility: Modified Independent                Transfers Overall transfer level: Independent                    Balance                                   ADL Overall ADL's : Needs assistance/impaired Eating/Feeding: Set up;Sitting Eating/Feeding Details (indicate cue type and reason): foam build up not as helpful, but pt is self feeding majority of meal with L hand and visual monitoring, instructed to use 2 hands to drink hot beverages and travel cup if pt wants to use his L hand singly Grooming: Oral care;Sitting Grooming Details (indicate cue type and reason): instructed pt in benefits of battery operated toothbrush vs use of foam build up on regular toothbrush, pt able to set up tooth brush with increased time and both hands         Upper Body Dressing : Set up;Sitting Upper Body Dressing Details (indicate cue type and reason): cued pt for dressing L UE first Lower Body Dressing: Set up;Sitting/lateral leans Lower Body Dressing Details (indicate cue type and reason): set up for socks using both hands, pt able to tie shoe when it is in his lap with increased time and mulitple trials, but not when on his  foot Toilet Transfer: Modified Independent;Ambulation   Toileting- Clothing Manipulation and Hygiene: Modified independent       Functional mobility during ADLs: Modified independent General ADL Comments: Pt able to verbalize safety issues related to impaired L UE sensation including driving. Instructed pt to ask MD about driving.      Vision                     Perception     Praxis      Cognition   Behavior During Therapy: WFL for tasks assessed/performed Overall Cognitive Status: Within Functional Limits for tasks assessed                  General Comments: some naming and memory issues noted today    Extremity/Trunk Assessment               Exercises     Shoulder Instructions       General Comments      Pertinent Vitals/ Pain       Pain Assessment: No/denies pain  Home Living  Prior Functioning/Environment              Frequency  Min 3X/week        Progress Toward Goals  OT Goals(current goals can now be found in the care plan section)  Progress towards OT goals: Progressing toward goals  Acute Rehab OT Goals Patient Stated Goal: to regain use of LUE Time For Goal Achievement: 03/07/16 Potential to Achieve Goals: Good  Plan Discharge plan remains appropriate    Co-evaluation                 End of Session     Activity Tolerance Patient tolerated treatment well   Patient Left in bed;with call bell/phone within reach   Nurse Communication          Time: [id number] OT Time Calculation (min): 42 min  Charges: OT General Charges $OT Visit: 1 Procedure OT Treatments $Self Care/Home Management : 38-52 mins  Malka So 03/01/2016, 11:43 AM [phone]

## 2016-03-01 NOTE — Progress Notes (Signed)
STROKE TEAM PROGRESS NOTE   HISTORY OF PRESENT ILLNESS (per record) This is a [name] who presents to the ED for the evaluation of L hand/arm clumsiness. History is obtained directly from the patient hwo is an excellent historian.   He reports that he was in his usual state of health until about 11 am today. He had gone to the gym where he rode the stationary bike, did some crunches and did some light weight training. On his way home he went to the store. He felt fine while shopping. When he got home, however, he states that his wife noted that he seemed to be having trouble controlling his L hand. He initially was not aware of this but after it was called to his attention he noted that his left hand felt a little strange. He was having difficulty holding things with that hand and also noted a decrease in fine motor control with the left hand. Of note, he is left hand dominant. He does not think he had any actual weakness. He does endorse decreased sensation in the L hand. He thought that his symptoms would go away but when they persisted he came to the ED for evaluation. Presently he thinks that his symptoms have improved but he is not back to his normal baseline yet.   He denies any vision loss, double vision, difficulty swallowing, difficulty speaking, headache, weakness, gait changes, or balance difficulties. He has not had any recent blows or injuries to the head or neck.   Last known well: 1100 NIHSS score: 1  tPA given?: No, out of window with minimal deficits   SUBJECTIVE (INTERVAL HISTORY) Patient still has right-sided incoordination and mild weakness. He denies prior history of stroke or TIA or known   carotid stenosis   OBJECTIVE Temp:  [97.2 F (36.2 C)-98.8 F (37.1 C)] 98.6 F (37 C) (01/25 0923) Pulse Rate:  [47-54] 54 (01/25 0923) Cardiac Rhythm: Sinus bradycardia (01/25 0704) Resp:  [18-20] 20 (01/25 0923) BP: (99-138)/(45-77) 111/66 (01/25 0923) SpO2:  [96 %-100  %] 100 % (01/25 0923)  CBC:   Recent Labs Lab 02/28/16 1701 02/28/16 1709  WBC 4.4  --   NEUTROABS 3.1  --   HGB 13.4 13.6  HCT 39.4 40.0  MCV 92.5  --   PLT 210  --     Basic Metabolic Panel:   Recent Labs Lab 02/28/16 1701 02/28/16 1709 03/01/16 0334  NA 139 142 140  K 4.0 3.8 3.9  CL 106 103 109  CO2 26  --  26  GLUCOSE 126* 122* 98  BUN 17 18 13   CREATININE 0.79 0.80 0.83  CALCIUM 9.0  --  8.5*    Lipid Panel:     Component Value Date/Time   CHOL 154 01/10/2016 1100   TRIG 44.0 01/10/2016 1100   HDL 70.80 01/10/2016 1100   CHOLHDL 2 01/10/2016 1100   VLDL 8.8 01/10/2016 1100   LDLCALC 75 01/10/2016 1100   HgbA1c:  Lab Results  Component Value Date   HGBA1C 5.4 02/29/2016   Urine Drug Screen: No results found for: LABOPIA, COCAINSCRNUR, LABBENZ, AMPHETMU, THCU, LABBARB    IMAGING  Mr Jodene Nam Neck W Wo Contrast 02/28/2016  MRI HEAD  1. Acute moderate-size right MCA territory infarct involving the right insula as well as the right frontal and parietal lobes as above. No associated mass effect or hemorrhage.  2. Otherwise negative MRI of the brain.   MRA HEAD 1. Occluded distal right  M3 branch, compatible with acute right MCA infarction.  2. Hypoplastic/absent right A1 segment with overall diminutive right carotid artery system.  3. Otherwise negative MRA of the intracranial circulation.   MRA NECK  1. Short-segment severe stenosis at the origin of the right ICA (estimated 80-90%).   Stenosis begins at the bifurcation and measures approximately 5 mm in length.  2. Widely patent left carotid artery system.  3. Widely patent vertebral arteries within the neck.    Ct Head Code Stroke W/o Cm 02/28/2016 1. Normal CT appearance of the brain for age.  No acute infarct.  2. Mild posterior ethmoid sinus disease.  3. ASPECTS is 10/10    Transthoracic Echocardiogram 02/29/2016 Study Conclusions - Left ventricle: The cavity size was normal. Wall  thickness was   normal. Systolic function was normal. The estimated ejection   fraction was in the range of 60% to 65%. Wall motion was normal;   there were no regional wall motion abnormalities. Doppler   parameters are consistent with abnormal left ventricular   relaxation (grade 1 diastolic dysfunction). The E/e&' ratio is <8,   suggesting normal LV filling pressure. 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 There was mild regurgitation. - Left atrium: The atrium was normal in size. - Tricuspid valve: There was mild regurgitation. - Pulmonary arteries: PA peak pressure: 32 mm Hg (S). - Inferior vena cava: The vessel was normal in size. The   respirophasic diameter changes were in the normal range (>= 50%),   consistent with normal central venous pressure. Impressions: - LVEF 60-65%, normal wall thickness, normal wall motion, diastolic   dysfunction with normal LV filling pressure, mild posterior   leaflet prolapse of the mitral valve with mild MR, normal LA   size, mild TR, RVSP 32 mmHg, normal IVC.    PHYSICAL EXAM Pleasant elderly Caucasian male not in distress. . Afebrile. Head is nontraumatic. Neck is supple without bruit.    Cardiac exam no murmur or gallop. Lungs are clear to auscultation. Distal pulses are well felt.  Neurological Exam :  Awake alert oriented 3 with normal speech and language function. Pupils equal reactive. Fundi were not visualized. Vision acuity and fields seem adequate. Mild left lower facial asymmetry when he smiles. Tongue midline. Mild left upper extremity drift. Mild weakness of left grip and intrinsic hand muscles. Diminished touch pinprick sensation on the left hemibody. Left mild sensory inattention present. Impaired proprioception in the left upper and lower extremity. Mild incoordination of the left upper and lower extremity. Deep tendon reflexes are symmetric. Plantars are downgoing. Gait was  not tested.   ASSESSMENT/PLAN [name]. is a 71 y.o. male with history of hypertension and arthritis  presenting with left upper extremity clumsiness. He did not receive IV t-PA due to late presentation.  Stroke:  Non-dominant infarct likely embolic embolic secondary to proximal  right internal carotid artery stenosis.  Resultant  Left hemibody sensory and proprioceptive loss  MRI - Acute moderate-size Rt MCA territory infarct involving the Rt insula as well as the Rt frontal and parietal lobes.  MRA Head - Occluded distal right M3 branch, compatible with acute right MCA infarction.  MRA Neck - Short-segment severe stenosis at the origin of the right ICA (estimated 80-90%).  Carotid Doppler - MRA neck  2D Echo - EF 60-65%. Mild MR - see report above.  LDL - 75  HgbA1c pending  VTE prophylaxis - SCDs Diet  regular Room service appropriate? Yes; Fluid consistency: Thin Diet - low sodium heart healthy  No antithrombotic prior to admission, now on aspirin 81 mg daily  Patient counseled to be compliant with his antithrombotic medications  Ongoing aggressive stroke risk factor management  Therapy recommendations: No follow-up PT recommended.  Disposition:  Pending  Hypertension  Stable  Permissive hypertension (OK if < 220/120) but gradually normalize in 5-7 days  Long-term BP goal normotensive  Hyperlipidemia  Home meds: No lipid lowering medications prior to admission.  LDL 75, goal < 70  Add low-dose Lipitor 20 mg daily  Continue statin at discharge    Other Stroke Risk Factors  Advanced age    Other Active Problems  Right internal carotid artery stenosis - consulted Dr. Oneida Alar  Mild hyperglycemia - await hemoglobin A1c    PLAN I spoke to the patient regarding the Stroke atrial fibrillation trial. The research coordinator will meet with the patient.Vascular surgery plan t for Rt CEA on 03/19/16 Dc home today and f/u in stroke clinic in 6  weeks. Hospital day # 1    I have personally examined this patient, reviewed notes, independently viewed imaging studies, participated in medical decision making and plan of care.ROS completed by me personally and pertinent positives fully documented  I have made any additions or clarifications directly to the above note. Agree with note above. He has presented with mild left-sided weakness and mostly sensory impairment secondary to symptomatic right proximal carotid stenosis and will benefit with right carotid revascularization. We'll consult vascular surgery. He may also consider possible participation in the stroke atrial fibrillation trial. He shown some interest. Coordinator will talk to him. No study specific procedure is done prior to patient signing consent form. Greater than 50% time during this 15 minute visit was spent on counseling and coordination of care about stroke risk, evaluation, carotid stenosis, revascularization and answering questions  Antony Contras, MD Medical Director [facility] Pager: [phone] 03/01/2016 1:48 PM   To contact Stroke Continuity provider, please refer to [email or web address] After hours, contact General Neurology

## 2016-03-01 NOTE — Discharge Summary (Signed)
Physician Discharge Summary  Ricky [name]. NX:[id number] DOB: 11/29/1945 DOA: 02/28/2016  PCP: Garret Reddish, MD  Admit date: 02/28/2016 Discharge date: 03/01/2016  Time spent: 65 minutes  Recommendations for Outpatient Follow-up:  1. Follow-up with Garret Reddish, MD in 1-2 weeks for hospital follow-up. On follow-up patient blood pressure need to be reassessed as his antihypertensive medications were discontinued during the hospitalization. Patient need a basic metabolic profile to follow-up on electrolytes and renal function. 2. Follow-up with Dr. Oneida Alar, vascular surgery for right carotid endarterectomy 03/19/2016 3. Follow-up with Dr. Leonie Man, neurology in 6 weeks.   Discharge Diagnoses:  Principal Problem:   Acute ischemic stroke [facility]) Active Problems:   Occlusion and stenosis of carotid artery   Essential hypertension   Discharge Condition: Stable and improved  Diet recommendation: Heart healthy  Filed Weights   02/28/16 2115  Weight: 64.3 kg (141 lb 12.8 oz)    History of present illness:  Per Dr Ricky [name]. is a 71 y.o. gentleman with a history of HTN and OA who was in his baseline state of health the morning of admission.  He was just returning from the gym around 11:30AM, when he developed acute onset decreased coordination and decreased sensation in his left hand.  He denied "true weakness" in the left upper extremity, but he admitted that he was having trouble holding things like his cell phone or the remote control.  His wife observed his symptoms.  No slurred speech or word finding difficulty.  No associated headache, light-headedness, or LOC.  No facial droop.  No weakness in his left leg.  The patient did not actually present to his PCP for evaluation until several hours later, around 3PM.  He was referred to the ED immediately via EMS, as a Code Stroke, for emergent evaluation.    ED Course: STAT head CT negative for acute CVA.  EKG showed a sinus  bradycardia.  Labs are essentially unremarkable.  Neurology consulted on the patient.  MRI brain and MRA head and neck already ordered.  Hospitalist asked to admit.  Hospital Course:  #1 acute ischemic stroke/Acute moderate sized R MCA infarction secondary to right ICA stenosis Patient had presented to the ED with decreased sensation and decreased fine motor skills in his left hand. Patient had presented from PCPs office and due to delayed presentation TPA was not offered. Head CT which was done was negative for acute CVA. Patient was admitted for stroke workup.  The MRI of head done showed an acute moderate sized right MCA territory infarct involving the right insula as well as right frontal and parietal lobes. MRA with an occluded distal right M3 branch compatible with acute right MCA infarction. Short segment of severe stenosis at the origin of the right ICA 80-90% is seen on MRA of the neck.  2-D echo with no source of emboli. Carotid Dopplers with preliminary findings of right 40-59% ICA stenosis, left 1-39% ICA stenosis antegrade vertebral flow. Patient was placed on aspirin for secondary stroke prevention. Neurology was also consulted and followed the patient throughout the hospitalization.   Fasting lipid panel done 01/10/2016 with a LDL of 75. Goal LDL less than 70. Patient started on a statin. Vascular consultation was obtained and patient scheduled for right carotid and direct admit 03/19/2016 in the outpatient setting. Patient was seen by PT OT and patient will be scheduled for outpatient OT and patient will follow-up with Dr. setting of neurology in 6 weeks. Patient will be discharged on aspirin  325 mg daily for secondary stroke prevention.   #2 hypertension Stable. Patient's antihypertensive medications were held during the hospitalization 12 out for permissive hypertension. Outpatient follow-up.   #3 right internal carotid artery stenosis Likely symptomatic and etiology of patient's  stroke. Vascular surgery has been consulted and assessed the patient and I recommended a right carotid endarterectomy to be scheduled for February 12 in the outpatient setting. Patient will be discharged on a full dose aspirin for secondary stroke prevention. Outpatient follow-up.    Procedures:  MRI brain 02/28/2016  MRA neck 02/28/2016  MRA head 02/28/2016 2-D echo 02/29/2016  Carotid Dopplers 02/29/2016  CT head 02/28/2016  2-D echo 02/29/16  Consultations:  Neurology: Dr. Shon Hale 02/28/2016  Vascular surgery: Dr Oneida Alar 02/29/2016  Discharge Exam: Vitals:   03/01/16 0500 03/01/16 0923  BP: 138/71 111/66  Pulse: (!) 48 (!) 54  Resp: 18 20  Temp: 98.1 F (36.7 C) 98.6 F (37 C)    General: NAD Cardiovascular: RRR Respiratory: CTAB  Discharge Instructions   Discharge Instructions    Ambulatory referral to Neurology    Complete by:  As directed    An appointment is requested in approximately: f/u in 6 weeks.   Diet - low sodium heart healthy    Complete by:  As directed    Increase activity slowly    Complete by:  As directed      Current Discharge Medication List    START taking these medications   Details  aspirin EC 325 MG tablet Take 1 tablet (325 mg total) by mouth daily. Qty: 30 tablet, Refills: 0    atorvastatin (LIPITOR) 20 MG tablet Take 1 tablet (20 mg total) by mouth daily at 6 PM. Qty: 30 tablet, Refills: 3      CONTINUE these medications which have NOT CHANGED   Details  acetaminophen (TYLENOL) 500 MG tablet Take 500 mg by mouth every 6 (six) hours as needed.    Multiple Vitamin (MULTIVITAMIN) tablet Take 1 tablet by mouth daily.      STOP taking these medications     atenolol (TENORMIN) 25 MG tablet        Allergies  Allergen Reactions  . Amoxicillin Rash   Follow-up Information    Garret Reddish, MD. Schedule an appointment as soon as possible for a visit in 1 week(s).   Specialty:  Family Medicine Why:  f/u in 1-2  weeks. Contact information: [place] [address] [phone]        Ruta Hinds, MD Follow up on 03/19/2016.   Specialties:  Vascular Surgery, Cardiology Why:  office will call to corrdinate appointment time for 03/19/2016 Contact information: [place] [address] [phone]        SETHI,PRAMOD, MD Follow up in 6 week(s).   Specialties:  Neurology, Radiology Why:  Office will call with appointment time. Contact information: [address] [place] [address] [phone]            The results of significant diagnostics from this hospitalization (including imaging, microbiology, ancillary and laboratory) are listed below for reference.    Significant Diagnostic Studies: Mr Jodene Nam Neck W Wo Contrast  Result Date: 02/28/2016 CLINICAL DATA:  Initial evaluation for acute left hand weakness. Concern for stroke. EXAM: MRI HEAD WITHOUT AND WITH CONTRAST MRA HEAD WITHOUT CONTRAST MRA NECK WITHOUT AND WITH CONTRAST TECHNIQUE: Multiplanar, multiecho pulse sequences of the brain and surrounding structures were obtained without and with intravenous contrast. Angiographic images  of the Circle of Willis were obtained using MRA technique without intravenous contrast. Angiographic images of the neck were obtained using MRA technique without and with intravenous contrast. Carotid stenosis measurements (when applicable) are obtained utilizing NASCET criteria, using the distal internal carotid diameter as the denominator. CONTRAST:  19mL MULTIHANCE GADOBENATE DIMEGLUMINE 529 MG/ML IV SOLN COMPARISON:  Prior CT from earlier the same day. FINDINGS: MRI HEAD FINDINGS Brain: Cerebral volume within normal limits for patient age. No significant cerebral white matter changes identified. There is confluent restricted diffusion involving the posterior right insular cortex, with extension into the posterior right frontal and parietal regions, compatible  with acute right MCA territory infarct. Infarct is largely cortical in nature. Localized early gyral swelling and edema without significant mass effect. Susceptibility artifact within the right sylvian fissure likely reflects thrombus within the distal right M3 branch (series 10, image 56). No evidence for associated hemorrhage within the infarct itself. No other evidence for prior or chronic infarction. No mass lesion, midline shift, or mass effect. Ventricles are normal in size without evidence for hydrocephalus. No extra-axial fluid collection. Major dural sinuses are patent. No abnormal enhancement. Pituitary gland and suprasellar region within normal limits. Midline structures intact and normal. Vascular: Major intracranial vascular flow voids are maintained. Probable thrombus within a distal right M3 branch as above. Skull and upper cervical spine: Craniocervical junction normal. Bone marrow signal intensity within normal limits. No acute scalp soft tissue abnormality. T1/T2 hypointense nonenhancing lesion within the left parietal scalp noted, of doubtful clinical significance. Sinuses/Orbits: Globes and orbital soft tissues within normal limits. Scattered mucosal thickening throughout the maxillary sinuses and ethmoidal air cells. No air-fluid level to suggest active sinus infection. No mastoid effusion. Inner ear structures within normal limits. MRA HEAD FINDINGS ANTERIOR CIRCULATION: Distal cervical segments of the internal carotid arteries are widely patent with antegrade flow. Right ICA is diminutive as compared to the left. Petrous, cavernous, and supraclinoid segments widely patent bilaterally. Left A1 segment widely patent. Right A1 segment hypoplastic/absent, which likely accounts for the diminutive right ICA. Anterior communicating artery normal. Anterior cerebral arteries widely patent to their distal aspects. M1 segments patent bilaterally without stenosis or occlusion. MCA bifurcations normal. No  proximal M2 occlusion. Probable right M3 the occlusion noted, correlating with susceptibility artifact seen on brain MRI (series 4, image 128). MCA branches otherwise widely patent bilaterally. POSTERIOR CIRCULATION: Vertebral arteries code dominant and patent to the vertebrobasilar junction. Posterior inferior cerebral arteries patent proximally. Basilar artery widely patent. Superior cerebral arteries patent bilaterally. Both of the posterior cerebral artery supplied via the basilar and are widely patent to their distal aspects. No aneurysm or vascular malformation. MRA NECK FINDINGS Visualized aortic arch of normal caliber with normal 3 vessel morphology. No high-grade stenosis at the origin of the great vessels. Visualized subclavian arteries are widely patent. Right common carotid artery widely patent from its origin to the bifurcation. There is a severe short-segment atheromatous stenosis at the proximal right ICA (estimated 80-90%). Stenosis begins at the bifurcation, and measures approximately 5 mm in length (series 180152, image 4). Right ICA and patent distally to the circle of Willis without additional stenosis. Overall, right ICA is diminutive as compared to the left, likely due to hypoplastic a 1 segment. Left common carotid artery patent from its origin to the bifurcation. Mild atheromatous irregularity about the left bifurcation without flow-limiting stenosis. Left ICA widely patent distally to the skullbase without stenosis or occlusion. Both of the vertebral arteries arise from the subclavian  arteries. Vertebral arteries are largely code dominant and widely patent within the neck without stenosis, dissection, or occlusion. IMPRESSION: MRI HEAD IMPRESSION: 1. Acute moderate-size right MCA territory infarct involving the right insula as well as the right frontal and parietal lobes as above. No associated mass effect or hemorrhage. 2. Otherwise negative MRI of the brain. MRA HEAD IMPRESSION: 1.  Occluded distal right M3 branch, compatible with acute right MCA infarction. 2. Hypoplastic/absent right A1 segment with overall diminutive right carotid artery system. 3. Otherwise negative MRA of the intracranial circulation. MRA NECK IMPRESSION: 1. Short-segment severe stenosis at the origin of the right ICA (estimated 80-90%). Stenosis begins at the bifurcation and measures approximately 5 mm in length. 2. Widely patent left carotid artery system. 3. Widely patent vertebral arteries within the neck. Electronically Signed   By: Jeannine Boga M.D.   On: 02/28/2016 21:37   Mr Jeri Cos [id number] Contrast  Result Date: 02/28/2016 CLINICAL DATA:  Initial evaluation for acute left hand weakness. Concern for stroke. EXAM: MRI HEAD WITHOUT AND WITH CONTRAST MRA HEAD WITHOUT CONTRAST MRA NECK WITHOUT AND WITH CONTRAST TECHNIQUE: Multiplanar, multiecho pulse sequences of the brain and surrounding structures were obtained without and with intravenous contrast. Angiographic images of the Circle of Willis were obtained using MRA technique without intravenous contrast. Angiographic images of the neck were obtained using MRA technique without and with intravenous contrast. Carotid stenosis measurements (when applicable) are obtained utilizing NASCET criteria, using the distal internal carotid diameter as the denominator. CONTRAST:  27mL MULTIHANCE GADOBENATE DIMEGLUMINE 529 MG/ML IV SOLN COMPARISON:  Prior CT from earlier the same day. FINDINGS: MRI HEAD FINDINGS Brain: Cerebral volume within normal limits for patient age. No significant cerebral white matter changes identified. There is confluent restricted diffusion involving the posterior right insular cortex, with extension into the posterior right frontal and parietal regions, compatible with acute right MCA territory infarct. Infarct is largely cortical in nature. Localized early gyral swelling and edema without significant mass effect. Susceptibility artifact within  the right sylvian fissure likely reflects thrombus within the distal right M3 branch (series 10, image 56). No evidence for associated hemorrhage within the infarct itself. No other evidence for prior or chronic infarction. No mass lesion, midline shift, or mass effect. Ventricles are normal in size without evidence for hydrocephalus. No extra-axial fluid collection. Major dural sinuses are patent. No abnormal enhancement. Pituitary gland and suprasellar region within normal limits. Midline structures intact and normal. Vascular: Major intracranial vascular flow voids are maintained. Probable thrombus within a distal right M3 branch as above. Skull and upper cervical spine: Craniocervical junction normal. Bone marrow signal intensity within normal limits. No acute scalp soft tissue abnormality. T1/T2 hypointense nonenhancing lesion within the left parietal scalp noted, of doubtful clinical significance. Sinuses/Orbits: Globes and orbital soft tissues within normal limits. Scattered mucosal thickening throughout the maxillary sinuses and ethmoidal air cells. No air-fluid level to suggest active sinus infection. No mastoid effusion. Inner ear structures within normal limits. MRA HEAD FINDINGS ANTERIOR CIRCULATION: Distal cervical segments of the internal carotid arteries are widely patent with antegrade flow. Right ICA is diminutive as compared to the left. Petrous, cavernous, and supraclinoid segments widely patent bilaterally. Left A1 segment widely patent. Right A1 segment hypoplastic/absent, which likely accounts for the diminutive right ICA. Anterior communicating artery normal. Anterior cerebral arteries widely patent to their distal aspects. M1 segments patent bilaterally without stenosis or occlusion. MCA bifurcations normal. No proximal M2 occlusion. Probable right M3 the occlusion noted, correlating with  susceptibility artifact seen on brain MRI (series 4, image 128). MCA branches otherwise widely patent  bilaterally. POSTERIOR CIRCULATION: Vertebral arteries code dominant and patent to the vertebrobasilar junction. Posterior inferior cerebral arteries patent proximally. Basilar artery widely patent. Superior cerebral arteries patent bilaterally. Both of the posterior cerebral artery supplied via the basilar and are widely patent to their distal aspects. No aneurysm or vascular malformation. MRA NECK FINDINGS Visualized aortic arch of normal caliber with normal 3 vessel morphology. No high-grade stenosis at the origin of the great vessels. Visualized subclavian arteries are widely patent. Right common carotid artery widely patent from its origin to the bifurcation. There is a severe short-segment atheromatous stenosis at the proximal right ICA (estimated 80-90%). Stenosis begins at the bifurcation, and measures approximately 5 mm in length (series 180152, image 4). Right ICA and patent distally to the circle of Willis without additional stenosis. Overall, right ICA is diminutive as compared to the left, likely due to hypoplastic a 1 segment. Left common carotid artery patent from its origin to the bifurcation. Mild atheromatous irregularity about the left bifurcation without flow-limiting stenosis. Left ICA widely patent distally to the skullbase without stenosis or occlusion. Both of the vertebral arteries arise from the subclavian arteries. Vertebral arteries are largely code dominant and widely patent within the neck without stenosis, dissection, or occlusion. IMPRESSION: MRI HEAD IMPRESSION: 1. Acute moderate-size right MCA territory infarct involving the right insula as well as the right frontal and parietal lobes as above. No associated mass effect or hemorrhage. 2. Otherwise negative MRI of the brain. MRA HEAD IMPRESSION: 1. Occluded distal right M3 branch, compatible with acute right MCA infarction. 2. Hypoplastic/absent right A1 segment with overall diminutive right carotid artery system. 3. Otherwise  negative MRA of the intracranial circulation. MRA NECK IMPRESSION: 1. Short-segment severe stenosis at the origin of the right ICA (estimated 80-90%). Stenosis begins at the bifurcation and measures approximately 5 mm in length. 2. Widely patent left carotid artery system. 3. Widely patent vertebral arteries within the neck. Electronically Signed   By: Jeannine Boga M.D.   On: 02/28/2016 21:37   Mr Jodene Nam Headm  Result Date: 02/28/2016 CLINICAL DATA:  Initial evaluation for acute left hand weakness. Concern for stroke. EXAM: MRI HEAD WITHOUT AND WITH CONTRAST MRA HEAD WITHOUT CONTRAST MRA NECK WITHOUT AND WITH CONTRAST TECHNIQUE: Multiplanar, multiecho pulse sequences of the brain and surrounding structures were obtained without and with intravenous contrast. Angiographic images of the Circle of Willis were obtained using MRA technique without intravenous contrast. Angiographic images of the neck were obtained using MRA technique without and with intravenous contrast. Carotid stenosis measurements (when applicable) are obtained utilizing NASCET criteria, using the distal internal carotid diameter as the denominator. CONTRAST:  55mL MULTIHANCE GADOBENATE DIMEGLUMINE 529 MG/ML IV SOLN COMPARISON:  Prior CT from earlier the same day. FINDINGS: MRI HEAD FINDINGS Brain: Cerebral volume within normal limits for patient age. No significant cerebral white matter changes identified. There is confluent restricted diffusion involving the posterior right insular cortex, with extension into the posterior right frontal and parietal regions, compatible with acute right MCA territory infarct. Infarct is largely cortical in nature. Localized early gyral swelling and edema without significant mass effect. Susceptibility artifact within the right sylvian fissure likely reflects thrombus within the distal right M3 branch (series 10, image 56). No evidence for associated hemorrhage within the infarct itself. No other evidence  for prior or chronic infarction. No mass lesion, midline shift, or mass effect. Ventricles are normal in  size without evidence for hydrocephalus. No extra-axial fluid collection. Major dural sinuses are patent. No abnormal enhancement. Pituitary gland and suprasellar region within normal limits. Midline structures intact and normal. Vascular: Major intracranial vascular flow voids are maintained. Probable thrombus within a distal right M3 branch as above. Skull and upper cervical spine: Craniocervical junction normal. Bone marrow signal intensity within normal limits. No acute scalp soft tissue abnormality. T1/T2 hypointense nonenhancing lesion within the left parietal scalp noted, of doubtful clinical significance. Sinuses/Orbits: Globes and orbital soft tissues within normal limits. Scattered mucosal thickening throughout the maxillary sinuses and ethmoidal air cells. No air-fluid level to suggest active sinus infection. No mastoid effusion. Inner ear structures within normal limits. MRA HEAD FINDINGS ANTERIOR CIRCULATION: Distal cervical segments of the internal carotid arteries are widely patent with antegrade flow. Right ICA is diminutive as compared to the left. Petrous, cavernous, and supraclinoid segments widely patent bilaterally. Left A1 segment widely patent. Right A1 segment hypoplastic/absent, which likely accounts for the diminutive right ICA. Anterior communicating artery normal. Anterior cerebral arteries widely patent to their distal aspects. M1 segments patent bilaterally without stenosis or occlusion. MCA bifurcations normal. No proximal M2 occlusion. Probable right M3 the occlusion noted, correlating with susceptibility artifact seen on brain MRI (series 4, image 128). MCA branches otherwise widely patent bilaterally. POSTERIOR CIRCULATION: Vertebral arteries code dominant and patent to the vertebrobasilar junction. Posterior inferior cerebral arteries patent proximally. Basilar artery widely  patent. Superior cerebral arteries patent bilaterally. Both of the posterior cerebral artery supplied via the basilar and are widely patent to their distal aspects. No aneurysm or vascular malformation. MRA NECK FINDINGS Visualized aortic arch of normal caliber with normal 3 vessel morphology. No high-grade stenosis at the origin of the great vessels. Visualized subclavian arteries are widely patent. Right common carotid artery widely patent from its origin to the bifurcation. There is a severe short-segment atheromatous stenosis at the proximal right ICA (estimated 80-90%). Stenosis begins at the bifurcation, and measures approximately 5 mm in length (series 180152, image 4). Right ICA and patent distally to the circle of Willis without additional stenosis. Overall, right ICA is diminutive as compared to the left, likely due to hypoplastic a 1 segment. Left common carotid artery patent from its origin to the bifurcation. Mild atheromatous irregularity about the left bifurcation without flow-limiting stenosis. Left ICA widely patent distally to the skullbase without stenosis or occlusion. Both of the vertebral arteries arise from the subclavian arteries. Vertebral arteries are largely code dominant and widely patent within the neck without stenosis, dissection, or occlusion. IMPRESSION: MRI HEAD IMPRESSION: 1. Acute moderate-size right MCA territory infarct involving the right insula as well as the right frontal and parietal lobes as above. No associated mass effect or hemorrhage. 2. Otherwise negative MRI of the brain. MRA HEAD IMPRESSION: 1. Occluded distal right M3 branch, compatible with acute right MCA infarction. 2. Hypoplastic/absent right A1 segment with overall diminutive right carotid artery system. 3. Otherwise negative MRA of the intracranial circulation. MRA NECK IMPRESSION: 1. Short-segment severe stenosis at the origin of the right ICA (estimated 80-90%). Stenosis begins at the bifurcation and  measures approximately 5 mm in length. 2. Widely patent left carotid artery system. 3. Widely patent vertebral arteries within the neck. Electronically Signed   By: Jeannine Boga M.D.   On: 02/28/2016 21:37   Ct Head Code Stroke W/o Cm  Result Date: 02/28/2016 CLINICAL DATA:  Code stroke. Code stroke. Sudden onset left hand weakness. Loss of cord nasion in the  left hand. EXAM: CT HEAD WITHOUT CONTRAST TECHNIQUE: Contiguous axial images were obtained from the base of the skull through the vertex without intravenous contrast. COMPARISON:  None. FINDINGS: Brain: No acute cortical infarct, hemorrhage, or mass lesion is present. The precentral gyrus is intact. No focal cortical lesions are evident. The basal ganglia are within normal limits bilaterally. The insular cortex is normal. Brainstem and cerebellum are normal. Vascular: No hyperdense vessel or unexpected calcification. Skull: The calvarium is intact. No focal lytic or blastic lesions are present. Sinuses/Orbits: Mild mucosal thickening is present within posterior ethmoid air cells bilaterally. The remaining paranasal sinuses and the mastoid air cells are clear. ASPECTS [facility] Stroke Program Early CT Score) - Ganglionic level infarction (caudate, lentiform nuclei, internal capsule, insula, M1-M3 cortex): 7/7 - Supraganglionic infarction (M4-M6 cortex): 3/3 Total score (0-10 with 10 being normal): 10/10 IMPRESSION: 1. Normal CT appearance of the brain for age.  No acute infarct. 2. Mild posterior ethmoid sinus disease. 3. ASPECTS is 10/10 These results were called by telephone at the time of interpretation on 02/28/2016 at 5:19 pm to Dr. Shon Hale, who verbally acknowledged these results. Electronically Signed   By: San Morelle M.D.   On: 02/28/2016 17:28    Microbiology: No results found for this or any previous visit (from the past 240 hour(s)).   Labs: Basic Metabolic Panel:  Recent Labs Lab 02/28/16 1701 02/28/16 1709  03/01/16 0334  NA 139 142 140  K 4.0 3.8 3.9  CL 106 103 109  CO2 26  --  26  GLUCOSE 126* 122* 98  BUN 17 18 13   CREATININE 0.79 0.80 0.83  CALCIUM 9.0  --  8.5*   Liver Function Tests:  Recent Labs Lab 02/28/16 1701  AST 37  ALT 37  ALKPHOS 51  BILITOT 0.5  PROT 6.4*  ALBUMIN 3.7   No results for input(s): LIPASE, AMYLASE in the last 168 hours. No results for input(s): AMMONIA in the last 168 hours. CBC:  Recent Labs Lab 02/28/16 1701 02/28/16 1709  WBC 4.4  --   NEUTROABS 3.1  --   HGB 13.4 13.6  HCT 39.4 40.0  MCV 92.5  --   PLT 210  --    Cardiac Enzymes: No results for input(s): CKTOTAL, CKMB, CKMBINDEX, TROPONINI in the last 168 hours. BNP: BNP (last 3 results) No results for input(s): BNP in the last 8760 hours.  ProBNP (last 3 results) No results for input(s): PROBNP in the last 8760 hours.  CBG: No results for input(s): GLUCAP in the last 168 hours.     SignedIrine Seal MD.  [facility] 03/01/2016, 11:23 AM

## 2016-03-02 ENCOUNTER — Encounter: Payer: Self-pay | Admitting: *Deleted

## 2016-03-02 DIAGNOSIS — Z006 Encounter for examination for normal comparison and control in clinical research program: Secondary | ICD-10-CM

## 2016-03-02 NOTE — Progress Notes (Signed)
Went to patient's room for follow up regarding the Stroke AF Research Study on Thursday, March 01, 2016.  Spoke with the patient to see if he was interested in enrolling.  He had been approached on Wednesday, February 29, 2016 by Reuben Likes, Therapist, sports.  The patient told me that his wife had the consent form and information regarding the study and would be coming in later.  He would ask her if they were interested and would let us know.  I gave him my name and the Research office number. If he was interested I would come back to the room and have him sign consent but would not delay his discharge from the hospital. He was discharged later on Thursday to home but never called our office.

## 2016-03-05 ENCOUNTER — Ambulatory Visit: Payer: Medicare Other | Attending: Internal Medicine | Admitting: Occupational Therapy

## 2016-03-05 DIAGNOSIS — R208 Other disturbances of skin sensation: Secondary | ICD-10-CM | POA: Insufficient documentation

## 2016-03-05 DIAGNOSIS — M6281 Muscle weakness (generalized): Secondary | ICD-10-CM | POA: Diagnosis not present

## 2016-03-05 DIAGNOSIS — R278 Other lack of coordination: Secondary | ICD-10-CM | POA: Diagnosis not present

## 2016-03-05 DIAGNOSIS — I69352 Hemiplegia and hemiparesis following cerebral infarction affecting left dominant side: Secondary | ICD-10-CM | POA: Insufficient documentation

## 2016-03-05 NOTE — Therapy (Signed)
[place] [address] [place], [place], [address] Phone: [phone]   Fax:  [phone]  Occupational Therapy Evaluation  Patient Details  Name: Ricky [name]. MRN: [id number] Date of Birth: 1945-12-04 Referring Provider: Dr. Garret Reddish (PCP) (Dr. Irine Seal (hospitalist))  Encounter Date: 03/05/2016      OT End of Session - 03/05/16 0915    Visit Number 1   Number of Visits 17   Date for OT Re-Evaluation 05/03/16   Authorization Type Medicare, G-code needed   Authorization - Visit Number 1   Authorization - Number of Visits 10   OT Start Time 0805   OT Stop Time 0850   OT Time Calculation (min) 45 min   Activity Tolerance Patient tolerated treatment well   Behavior During Therapy [facility] for tasks assessed/performed      Past Medical History:  Diagnosis Date  . Arthritis   . Hypertension     Past Surgical History:  Procedure Laterality Date  . SHOULDER SURGERY     left- injections intermittent now  . TONSILLECTOMY    . TOTAL KNEE ARTHROPLASTY Left 12/14/2013   Procedure: LEFT TOTAL KNEE ARTHROPLASTY;  Surgeon: Gearlean Alf, MD;  Location: [facility];  Service: Orthopedics;  Laterality: Left;    There were no vitals filed for this visit.      Subjective Assessment - 03/05/16 0812    Subjective  Had neck pain this morning, but alleviated with Tylenol   Patient is accompained by: Family member  wife   Pertinent History HTN, L knee replacement, hx of bilateral RTC surgeries (limitations with overhead lifting)   Currently in Pain? No/denies           [facility] OT Assessment - 03/05/16 0001      Assessment   Diagnosis Acute ischemic stroke   Referring Provider Dr. Garret Reddish (PCP)  Dr. 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, gardening, goes to [employer] (bike, crunches, [employer]) almost daily     ADL   Eating/Feeding Modified independent  has foam grips but doesn't use, difficulty/spills    Grooming Modified independent  difficulty, toothbrush turns over, manipulating comb   Upper Body Bathing Modified independent   Lower Body Bathing Modified independent   Upper Body Dressing --  difficulty with fasteners   Lower Body Dressing Modified independent  difficulty with fasteners, donning belt   Toilet Tranfer Modified independent   Toileting - Clothing Manipulation Modified independent   Toileting -  Hygiene Modified Independent   Tub/Shower Transfer Modified independent   Transfers/Ambulation Related to ADL's independent   ADL comments difficulty holding cups steady without turning over and drops things when not focusing on them     IADL   Prior Level of Function Light Housekeeping shares with wife   Light Housekeeping --  has not attempted   Prior Level of Function Meal Prep shares with wife   Meal Prep --  only made  eggs, difficulty spreading   Programmer, applications own vehicle   Medication Management Is responsible for taking medication in correct dosages at correct time     Mobility   Mobility Status Independent     Written Expression   Dominant Hand Left   Handwriting --  approx 85% legibility     Vision - History   Baseline Vision Bifocals     Activity Tolerance   Activity Tolerance --  fatigues by end of the day   Activity Tolerance Comments Pt/wife report incr processing speed and difficulty with coordination by end of the day     Cognition   Overall Cognitive Status Within Functional Limits for  tasks assessed  pt/wife deny changes     Observation/Other Assessments   Other Surveys  Select   Physical Performance Test   Yes   Simulated Eating Time (seconds) 13.60   Simulated Eating Comments holds spoon at the end   Donning Doffing Jacket Time (seconds) 13.32sec   Donning Doffing Jacket Comments Fastening/unfastening 3 buttons in 68.56 sec.     Sensation   Light Touch Impaired by gross assessment   Stereognosis Impaired by gross assessment  0/2   Hot/Cold Impaired by gross assessment  per pt report   Proprioception --  appears grossly [facility]   Additional Comments dropping items when not attending to it     Coordination   9 Hole Peg Test Right;Left   Right 9 Hole Peg Test 26.75   Left 9 Hole Peg Test 78.28  compensation patterns noted, difficulty w/ in-hand manipulti   Box and Blocks R-62blocks, L-40blocks     ROM / Strength   AROM / PROM / Strength AROM;Strength     AROM   Overall AROM  Within functional limits for tasks performed   Overall AROM Comments except pt reports difficulty turning head to the L     Strength   Overall Strength Deficits   Strength Assessment Site Shoulder;Elbow   Right/Left Shoulder Right;Left   Right Shoulder Flexion 3/5   Right Shoulder ABduction 3+/5   Right Shoulder Horizontal ABduction 4-/5   Right Shoulder Horizontal ADduction 4-/5   Left Shoulder Flexion 3+/5   Left Shoulder ABduction 3+/5   Left Shoulder Horizontal ABduction --  4-/5   Left Shoulder Horizontal ADduction 4-/5   Right/Left Elbow Right;Left   Right Elbow Flexion 5/5   Right Elbow Extension 5/5   Left Elbow Flexion 5/5   Left Elbow Extension 5/5     Hand Function   Right Hand Grip (lbs) 75   Left Hand Grip (lbs) 69                         OT Education - 03/05/16 0917    Education provided Yes   Education Details Recommended slow transition to gym activities (start with 59min on bike vs 1hr due to fatigue level, and gradually incr, begin  slowly with LE wt. machines, but hold UE machines currently)   Person(s) Educated Patient;Spouse   Methods Explanation   Comprehension Verbalized understanding          OT Short Term Goals - 03/05/16 0925      OT SHORT TERM GOAL #1   Title Pt will be independent with initial HEP for coordination, LUE strength.--check STGs 03/04/16   Time 4   Period Weeks   Status New     OT SHORT TERM GOAL #2   Title Pt will improve L  hand coordination for ADLs as shown by improving time on 9-hole peg test by at least 15sec.   Baseline 78.28sec   Time 4   Period Weeks   Status New     OT SHORT TERM GOAL #3   Title Pt will improve L coordination/functional reaching for ADLs as shown by improving score on box and blocks test by at least 10.   Baseline R-62, L-40 blocks   Time 4   Period Weeks   Status New     OT SHORT TERM GOAL #4   Title Pt will be able to write 3 sentences with at least 95% legibility with incr time prn.   Baseline approx 85%   Time 4   Period Weeks   Status New     OT SHORT TERM GOAL #5   Title Pt will improve ability to complete dressing fasteners as shown by fastening/unfastening 3 buttons in 55sec or less.   Baseline 68.56sec   Time 4   Period Weeks   Status New     Additional Short Term Goals   Additional Short Term Goals Yes     OT SHORT TERM GOAL #6   Title Pt will be able to carry cup of water at least 200 feet with cognitive task without spills.   Time 4   Period Weeks   Status New           OT Long Term Goals - 03/05/16 0929      OT LONG TERM GOAL #1   Title Pt will verbalize understanding of strategies/AE to incr ease/safety with ADLs/IADLs prn.--check LTGs 05/03/16   Time 8   Period Weeks   Status New     OT LONG TERM GOAL #2   Title Pt will improve L hand coordination for ADLs as shown by improving time on 9-hole peg test by at least 25sec.   Baseline 78.28sec   Time 8   Period Weeks   Status New     OT LONG TERM GOAL #3   Title Pt  will report no significant difficulty completing grooming tasks with dominant LUE.   Baseline 8   Period Weeks   Status New     OT LONG TERM GOAL #4   Title Pt will be able to write 5 sentences with at least 100% legibility with incr time prn.   Baseline approx 85%   Time 8   Period Weeks   Status New     OT LONG TERM GOAL #5   Title Pt will improve ability to complete dressing fasteners as shown by fastening/unfastening 3 buttons in 45sec or less.   Baseline 68.56sec   Period Weeks   Status New     Long Term Additional Goals   Additional Long Term Goals Yes     OT LONG TERM GOAL #6   Title Pt report ability to participate in previous cooking/cleaning tasks mod I.   Time 8   Period Weeks   Status New               Plan - 03/05/16 0920    Clinical Impression Statement Pt is a 71 y.o. male s/p CVA affecting L dominant side.  Pt with PMH that includes:  HTN, L knee replacement, hx of bilateral RTC surgeries (limitations with overhead lifting).  Pt was very active prior to CVA and was going to gym daily and enjoys gardening and photography.  Pt presents with mild decr strength, decr coordination, and decr  sensation affecting dominant LUE functional use.  Pt would benefit from occupational therapy to address these deficits so that pt can return to previous IADLs and leisure activities, improve dominant LUE functional use, and incr quality of life.   Rehab Potential Good   Clinical Impairments Affecting Rehab Potential sensation deficits   OT Frequency 2x / week   OT Duration 8 weeks   OT Treatment/Interventions Cryotherapy;Parrafin;Therapeutic exercise;DME and/or AE instruction;Therapist, nutritional;Therapeutic activities;Patient/family education;Manual Therapy;Neuromuscular education;Fluidtherapy;Ultrasound;Electrical Stimulation;Moist Heat;Contrast Bath;Energy conservation;Passive range of motion;Therapeutic exercises   Plan initiate coordination HEP and HEP for L  shoulder/hand weakness   Recommended Other Services none at this time   Consulted and Agree with Plan of Care Patient;Family member/caregiver   Family Member Consulted wife      Patient will benefit from skilled therapeutic intervention in order to improve the following deficits and impairments:  Decreased activity tolerance, Decreased coordination, Decreased knowledge of use of DME, Decreased safety awareness, Decreased strength, Impaired UE functional use, Impaired sensation  Visit Diagnosis: Other lack of coordination - Plan: Ot plan of care cert/re-cert  Other disturbances of skin sensation - Plan: Ot plan of care cert/re-cert  Muscle weakness (generalized) - Plan: Ot plan of care cert/re-cert  Hemiplegia and hemiparesis following cerebral infarction affecting left dominant side ([place]) - Plan: Ot plan of care cert/re-cert      G-Codes - [id number]    Functional Assessment Tool Used 9-hole peg test:  L-78.28sec.  Box and blocks test:  R-62, L-40 blocks   Functional Limitation Carrying, moving and handling objects   Carrying, Moving and Handling Objects Current Status [phone]) At least 40 percent but less than 60 percent impaired, limited or restricted   Carrying, Moving and Handling Objects Goal Status [id number]) At least 1 percent but less than 20 percent impaired, limited or restricted      Problem List Patient Active Problem List   Diagnosis Date Noted  . Occlusion and stenosis of carotid artery 02/29/2016  . Acute ischemic stroke ([place]) 02/28/2016  . Essential hypertension 12/20/2014  . OA (osteoarthritis) of knee 12/14/2013    [facility] 03/05/2016, 10:06 AM  [place] [address] [place], [place], [address] Phone: [phone]   Fax:  [phone]  Name: Ricky [name]. MRN: [id number] Date of Birth: October 10, 1945   [name], OTR/L [facility] [address] [place], [place]  [address] [phone] phone [phone] 03/05/16 10:06 AM

## 2016-03-07 ENCOUNTER — Ambulatory Visit: Payer: Medicare Other | Admitting: Occupational Therapy

## 2016-03-07 DIAGNOSIS — R208 Other disturbances of skin sensation: Secondary | ICD-10-CM | POA: Diagnosis not present

## 2016-03-07 DIAGNOSIS — M6281 Muscle weakness (generalized): Secondary | ICD-10-CM

## 2016-03-07 DIAGNOSIS — I69352 Hemiplegia and hemiparesis following cerebral infarction affecting left dominant side: Secondary | ICD-10-CM | POA: Diagnosis not present

## 2016-03-07 DIAGNOSIS — R278 Other lack of coordination: Secondary | ICD-10-CM

## 2016-03-07 NOTE — Therapy (Signed)
[place] [address] [place], [place], [address] Phone: [phone]   Fax:  [phone]  Occupational Therapy Treatment  Patient Details  Name: Ricky [name]. MRN: [id number] Date of Birth: 16-Oct-1945 Referring Provider: Dr. Garret Reddish (PCP) (Dr. Irine Seal (hospitalist))  Encounter Date: 03/07/2016      OT End of Session - 03/07/16 0903    Visit Number 2   Number of Visits 17   Authorization Type Medicare, G-code needed   Authorization - Visit Number 2   Authorization - Number of Visits 10   OT Start Time 0850   OT Stop Time 0930   OT Time Calculation (min) 40 min   Activity Tolerance Patient tolerated treatment well   Behavior During Therapy [facility] for tasks assessed/performed      Past Medical History:  Diagnosis Date  . Arthritis   . Hypertension     Past Surgical History:  Procedure Laterality Date  . SHOULDER SURGERY     left- injections intermittent now  . TONSILLECTOMY    . TOTAL KNEE ARTHROPLASTY Left 12/14/2013   Procedure: LEFT TOTAL KNEE ARTHROPLASTY;  Surgeon: Gearlean Alf, MD;  Location: [facility];  Service: Orthopedics;  Laterality: Left;    There were no vitals filed for this visit.      Subjective Assessment - 03/07/16 0903    Subjective  Had neck pain this morning, but alleviated with Tylenol   Pertinent History HTN, L knee replacement, hx of bilateral RTC surgeries (limitations with overhead lifting)   Currently in Pain? No/denies              Pt was educated in initial HEP, see pt instructions. Arm bike x 6 mins level 3 for conditioning                OT Education - 03/07/16 0947    Education provided Yes   Education Details coordination HEP, light strengthening for LUE   Person(s) Educated Patient;Spouse   Methods Explanation;Demonstration   Comprehension Verbalized understanding;Returned demonstration;Verbal cues required          OT  Short Term Goals - 03/05/16 0925      OT SHORT TERM GOAL #1   Title Pt will be independent with initial HEP for coordination, LUE strength.--check STGs 03/04/16   Time 4   Period Weeks   Status New     OT SHORT TERM GOAL #2   Title Pt will improve L hand coordination for ADLs as shown by improving time on 9-hole peg test by at least 15sec.   Baseline 78.28sec   Time 4   Period Weeks   Status New     OT SHORT TERM GOAL #3   Title Pt will improve L coordination/functional reaching for ADLs as shown by improving score on box and blocks test by at least 10.   Baseline R-62, L-40 blocks   Time 4   Period Weeks   Status New     OT SHORT TERM GOAL #4   Title Pt will be able to write 3 sentences with at least 95% legibility with incr time prn.   Baseline approx 85%   Time 4   Period Weeks   Status New     OT SHORT TERM GOAL #5   Title Pt will improve ability to complete dressing fasteners as shown by fastening/unfastening 3 buttons in 55sec or less.   Baseline 68.56sec   Time 4   Period Weeks  Status New     Additional Short Term Goals   Additional Short Term Goals Yes     OT SHORT TERM GOAL #6   Title Pt will be able to carry cup of water at least 200 feet with cognitive task without spills.   Time 4   Period Weeks   Status New           OT Long Term Goals - 03/05/16 0929      OT LONG TERM GOAL #1   Title Pt will verbalize understanding of strategies/AE to incr ease/safety with ADLs/IADLs prn.--check LTGs 05/03/16   Time 8   Period Weeks   Status New     OT LONG TERM GOAL #2   Title Pt will improve L hand coordination for ADLs as shown by improving time on 9-hole peg test by at least 25sec.   Baseline 78.28sec   Time 8   Period Weeks   Status New     OT LONG TERM GOAL #3   Title Pt will report no significant difficulty completing grooming tasks with dominant LUE.   Baseline 8   Period Weeks   Status New     OT LONG TERM GOAL #4   Title Pt will be able  to write 5 sentences with at least 100% legibility with incr time prn.   Baseline approx 85%   Time 8   Period Weeks   Status New     OT LONG TERM GOAL #5   Title Pt will improve ability to complete dressing fasteners as shown by fastening/unfastening 3 buttons in 45sec or less.   Baseline 68.56sec   Period Weeks   Status New     Long Term Additional Goals   Additional Long Term Goals Yes     OT LONG TERM GOAL #6   Title Pt report ability to participate in previous cooking/cleaning tasks mod I.   Time 8   Period Weeks   Status New               Plan - 03/07/16 0950    Clinical Impression Statement Pt is progressing towards goals. Therapist reinforced previous therapist recommendations tha pt doe not use wieght machines at gym for UE's yet except for arm bike due to risk for injury.   Rehab Potential Good   Clinical Impairments Affecting Rehab Potential sensation deficits   OT Frequency 2x / week   OT Duration 8 weeks   OT Treatment/Interventions Cryotherapy;Parrafin;Therapeutic exercise;DME and/or AE instruction;Therapist, nutritional;Therapeutic activities;Patient/family education;Manual Therapy;Neuromuscular education;Fluidtherapy;Ultrasound;Electrical Stimulation;Moist Heat;Contrast Bath;Energy conservation;Passive range of motion;Therapeutic exercises   Plan ? putty for LUE   Consulted and Agree with Plan of Care Patient;Family member/caregiver   Family Member Consulted wife      Patient will benefit from skilled therapeutic intervention in order to improve the following deficits and impairments:  Decreased activity tolerance, Decreased coordination, Decreased knowledge of use of DME, Decreased safety awareness, Decreased strength, Impaired UE functional use, Impaired sensation  Visit Diagnosis: Other lack of coordination  Other disturbances of skin sensation  Muscle weakness (generalized)  Hemiplegia and hemiparesis following cerebral infarction  affecting left dominant side [facility])    Problem List Patient Active Problem List   Diagnosis Date Noted  . Occlusion and stenosis of carotid artery 02/29/2016  . Acute ischemic stroke ([place]) 02/28/2016  . Essential hypertension 12/20/2014  . OA (osteoarthritis) of knee 12/14/2013    Lettie Czarnecki 03/07/2016, 9:55 AM  [place] [id number]  [place], [place], [address] Phone: [phone]   Fax:  [phone]  Name: Ricky [name]. MRN: [id number] Date of Birth: 1945-12-06

## 2016-03-07 NOTE — Patient Instructions (Signed)
  Coordination Activities  Perform the following activities for 20 minutes 1 times per day with left hand(s).   Rotate ball in fingertips (clockwise and counter-clockwise).  Toss ball between hands.  Toss ball in air and catch with the same hand.  Flip cards 1 at a time as fast as you can.  Deal cards with your thumb (Hold deck in hand and push card off top with thumb).  Pick up coins, buttons, marbles, dried beans/pasta of different sizes and place in container.  Pick up coins and place in container or coin bank.  Pick up coins and stack.  Pick up coins one at a time until you get 5-10 in your hand, then move coins from palm to fingertips to stack one at a time.    Hold a 1 lbs weight or bottle in each hand Raise arms to shoulder height 2 sets of 10 reps, then chest press( straighten elbows in front then bend elbows 2 sets of 10 Hold 1 lbs weight in left hand, raise arm out to the side in a low range, do not hike shoulder, hand facing forward 1-2 sets of 10 reps.

## 2016-03-08 ENCOUNTER — Ambulatory Visit (INDEPENDENT_AMBULATORY_CARE_PROVIDER_SITE_OTHER): Payer: Medicare Other | Admitting: Family Medicine

## 2016-03-08 ENCOUNTER — Other Ambulatory Visit (HOSPITAL_COMMUNITY): Payer: Self-pay | Admitting: *Deleted

## 2016-03-08 ENCOUNTER — Encounter: Payer: Self-pay | Admitting: Family Medicine

## 2016-03-08 DIAGNOSIS — E785 Hyperlipidemia, unspecified: Secondary | ICD-10-CM

## 2016-03-08 DIAGNOSIS — I10 Essential (primary) hypertension: Secondary | ICD-10-CM

## 2016-03-08 DIAGNOSIS — I69359 Hemiplegia and hemiparesis following cerebral infarction affecting unspecified side: Secondary | ICD-10-CM | POA: Insufficient documentation

## 2016-03-08 DIAGNOSIS — I6529 Occlusion and stenosis of unspecified carotid artery: Secondary | ICD-10-CM | POA: Diagnosis not present

## 2016-03-08 DIAGNOSIS — I639 Cerebral infarction, unspecified: Secondary | ICD-10-CM

## 2016-03-08 DIAGNOSIS — Z8673 Personal history of transient ischemic attack (TIA), and cerebral infarction without residual deficits: Secondary | ICD-10-CM | POA: Diagnosis not present

## 2016-03-08 HISTORY — DX: Hyperlipidemia, unspecified: E78.5

## 2016-03-08 MED ORDER — AMLODIPINE BESYLATE 2.5 MG PO TABS: 2.5000 mg | ORAL_TABLET | Freq: Every day | ORAL | 3 refills | Status: DC

## 2016-03-08 NOTE — Assessment & Plan Note (Signed)
S: controlled poorly on no meds post stroke.  BP Readings from Last 3 Encounters:  03/08/16 140/90  03/01/16 111/66  02/28/16 (!) 150/94  A/P: Trial amlodipine 2.5mg . Home #s averaging high 130s over high 80s- going to target <135/85 and may consider lower depending how he tolerates new medication. Will not restart atenolol given how low his HR is.

## 2016-03-08 NOTE — Progress Notes (Signed)
Subjective:  Ricky [name]. is a 71 y.o. year old very pleasant male patient who presents for/with See problem oriented charting ROS- minimal left hand weakness, loss of sensation. No slurred words. No confusion.    Past Medical History-  Patient Active Problem List   Diagnosis Date Noted  . Hemiparesis affecting dominant side as late effect of stroke ([place]) 03/08/2016    Priority: High  . Occlusion and stenosis of carotid artery 02/29/2016    Priority: High  . History of CVA (cerebrovascular accident) 02/28/2016    Priority: High  . Essential hypertension 12/20/2014    Priority: Medium  . OA (osteoarthritis) of knee 12/14/2013    Priority: Low    Medications- reviewed and updated Current Outpatient Prescriptions  Medication Sig Dispense Refill  . acetaminophen (TYLENOL) 500 MG tablet Take 500-1,000 mg by mouth every 6 (six) hours as needed (for pain.).     Marland Kitchen aspirin EC 325 MG tablet Take 1 tablet (325 mg total) by mouth daily. 30 tablet 0  . atorvastatin (LIPITOR) 20 MG tablet Take 1 tablet (20 mg total) by mouth daily at 6 PM. 30 tablet 3  . Multiple Vitamin (MULTIVITAMIN WITH MINERALS) TABS tablet Take 1 tablet by mouth daily.    Marland Kitchen amLODipine (NORVASC) 2.5 MG tablet Take 1 tablet (2.5 mg total) by mouth daily. 90 tablet 3   No current facility-administered medications for this visit.     Objective: BP 140/90 (BP Location: Left Arm, Patient Position: Sitting, Cuff Size: Normal)   Pulse 61   Temp 97.9 F (36.6 C) (Oral)   Ht 5' 5.5" (1.664 m)   Wt 142 lb (64.4 kg)   SpO2 98%   BMI 23.27 kg/m  Gen: NAD, resting comfortably CV: RRR no murmurs rubs or gallops Lungs: CTAB no crackles, wheeze, rhonchi Abdomen: soft/nontender/nondistended/normal bowel sounds. No rebound or guarding.  Ext: no edema Skin: warm, dry Neuro: Left hand Not as sensitive to temp. No monofilament sensation. Good grip strength but slightly less compared to right. Rapid alternativing movements ok.  Pronator drift noted on left but none on right  Assessment/Plan:  Hemiparesis affecting dominant side as late effect of stroke ([place]) S: Patient with minimal deficits at this point but primarily sensory deficits. He is working with PT/OT on this A/P: continue PT/OT.    History of CVA (cerebrovascular accident) S: Patient with history of hypertension presented to our office nearly 4 hours after coming home from gym on 1/23/8 and noticing left hand decreased sensation. He admitted toruble to holding objects and dropping some things. Wife noted symptoms and called PCP. He was urgently sent to the hospital for code stroke once seen in our office. Stat head CT negative for acute CVA. Due to presentation over 4 hour past CVA- TPA was not administered. Had sinus bradycardia on atenolol. Labs were largely normal but with LDL around 75.  In hospital 2 nights.   MRA showed right MCA territory infarct. 2-d echo without embolic source. Carotid dopplers showed 40-59% right ICA stenosis but 1-39% left sided ICA stenosis. MRA though showed occluded distal right M3 branch compatible with acute right MCA infarction. Short segment of severe stenosis at the origin of the right ICA was noted at 80-90% in neck and presumed to be culprit lesion. Saw Dr. Oneida Alar nad plan for carotid endarterectomy.   LDL goal less than 70 so started on statin. BP meds held at time of stroke to allow permissive hypertension. Started on aspirin 325mg  for secondary prevention.  A/P: history CVA. Extensive counseling.  1. For risk factor modification- Suspect LDL in better place- recheck 3-4 months 2. Will attempt to tighten BP control 3. Agree with carotid endarterectomy 4. Continue aspirin 325mg  5. Follow up with neurology 6 weeks or so.  6. Work with PT as noted- primarily sensation deficits reduced monofilament, temperature. Good grip strength but slightly reduced compared to right an dis left handed.   Essential hypertension S:  controlled poorly on no meds post stroke.  BP Readings from Last 3 Encounters:  03/08/16 140/90  03/01/16 111/66  02/28/16 (!) 150/94  A/P: Trial amlodipine 2.5mg . Home #s averaging high 130s over high 80s- going to target <135/85 and may consider lower depending how he tolerates new medication. Will not restart atenolol given how low his HR is.   Hyperlipidemia LDL 75 pre stroke. Goal <70 so on atorvastatin 20mg  daily  Early may  Meds ordered this encounter  Medications  . amLODipine (NORVASC) 2.5 MG tablet    Sig: Take 1 tablet (2.5 mg total) by mouth daily.    Dispense:  90 tablet    Refill:  3   The duration of face-to-face time during this visit was greater than 25 minutes. Greater than 50% of this time was spent in counseling about stroke, targeting goals post stroke, meaning of tests/workup he had, consoling patient through tough time.   Return precautions advised.  Garret Reddish, MD

## 2016-03-08 NOTE — Assessment & Plan Note (Addendum)
S: Patient with history of hypertension presented to our office nearly 4 hours after coming home from gym on 1/23/8 and noticing left hand decreased sensation. He admitted toruble to holding objects and dropping some things. Wife noted symptoms and called PCP. He was urgently sent to the hospital for code stroke once seen in our office. Stat head CT negative for acute CVA. Due to presentation over 4 hour past CVA- TPA was not administered. Had sinus bradycardia on atenolol. Labs were largely normal but with LDL around 75.  In hospital 2 nights.   MRA showed right MCA territory infarct. 2-d echo without embolic source. Carotid dopplers showed 40-59% right ICA stenosis but 1-39% left sided ICA stenosis. MRA though showed occluded distal right M3 branch compatible with acute right MCA infarction. Short segment of severe stenosis at the origin of the right ICA was noted at 80-90% in neck and presumed to be culprit lesion. Saw Dr. Oneida Alar nad plan for carotid endarterectomy.   LDL goal less than 70 so started on statin. BP meds held at time of stroke to allow permissive hypertension. Started on aspirin 325mg  for secondary prevention.  A/P: history CVA. Extensive counseling.  1. For risk factor modification- Suspect LDL in better place- recheck 3-4 months 2. Will attempt to tighten BP control 3. Agree with carotid endarterectomy 4. Continue aspirin 325mg  5. Follow up with neurology 6 weeks or so.  6. Work with PT as noted- primarily sensation deficits reduced monofilament, temperature. Good grip strength but slightly reduced compared to right an dis left handed.

## 2016-03-08 NOTE — Progress Notes (Signed)
Pre visit review using our clinic review tool, if applicable. No additional management support is needed unless otherwise documented below in the visit note. 

## 2016-03-08 NOTE — Assessment & Plan Note (Signed)
S: Patient with minimal deficits at this point but primarily sensory deficits. He is working with PT/OT on this A/P: continue PT/OT.

## 2016-03-08 NOTE — Pre-Procedure Instructions (Signed)
Ricky [name].  03/08/2016     Your procedure is scheduled on Monday, March 19, 2016 at 7:30 AM.   Report to [facility] Entrance "A" Admitting Office at 5:30 AM.   Call this number if you have problems the morning of [phone]   Questions prior to day of surgery, please call [phone] between 8 & 4 PM.   Remember:  Do not eat food or drink liquids after midnight Sunday, 03/18/16.   Take these medicines the morning of surgery with A SIP OF WATER: Amlodipine (Norvasc), Aspirin, Tylenol - if needed  Stop Multivitamins 5 days prior to surgery. Do not use NSAIDS (Ibuprofen, Aleve, etc) 5 days prior to surgery.   Do not wear jewelry.  Do not wear lotions, powders, cologne or deodorant.  Men may shave face and neck.  Do not bring valuables to the hospital.  [facility] is not responsible for any belongings or valuables.  Contacts, dentures or bridgework may not be worn into surgery.  Leave your suitcase in the car.  After surgery it may be brought to your room.  For patients admitted to the hospital, discharge time will be determined by your treatment team.  Special instructions:  [place] - Preparing for Surgery  Before surgery, you can play an important role.  Because skin is not sterile, your skin needs to be as free of germs as possible.  You can reduce the number of germs on you skin by washing with CHG (chlorahexidine gluconate) soap before surgery.  CHG is an antiseptic cleaner which kills germs and bonds with the skin to continue killing germs even after washing.  Please DO NOT use if you have an allergy to CHG or antibacterial soaps.  If your skin becomes reddened/irritated stop using the CHG and inform your nurse when you arrive at Short Stay.  Do not shave (including legs and underarms) for at least 48 hours prior to the first CHG shower.  You may shave your face.  Please follow these instructions carefully:   1.  Shower with CHG Soap the  night before surgery and the                    morning of Surgery.  2.  If you choose to wash your hair, wash your hair first as usual with your       normal shampoo.  3.  After you shampoo, rinse your hair and body thoroughly to remove the shampoo.  4.  Use CHG as you would any other liquid soap.  You can apply chg directly       to the skin and wash gently with scrungie or a clean washcloth.  5.  Apply the CHG Soap to your body ONLY FROM THE NECK DOWN.        Do not use on open wounds or open sores.  Avoid contact with your eyes, ears, mouth and genitals (private parts).  Wash genitals (private parts) with your normal soap.  6.  Wash thoroughly, paying special attention to the area where your surgery        will be performed.  7.  Thoroughly rinse your body with warm water from the neck down.  8.  DO NOT shower/wash with your normal soap after using and rinsing off       the CHG Soap.  9.  Pat yourself dry with a clean towel.  10.  Wear clean pajamas.            11.  Place clean sheets on your bed the night of your first shower and do not        sleep with pets.  Day of Surgery  Do not apply any lotions/deodorants the morning of surgery.  Please wear clean clothes to the hospital.   Please read over the fact sheets that you were given.

## 2016-03-08 NOTE — Patient Instructions (Signed)
Please stop by desk and cancel march 1st visit. Lets move that out to 3-4 months but I am more than happy to see you before that if needed  Start amlodipine 2.5mg  daily. This will be repeated at neurology and also Dr. Oneida Alar office. Goal at home would be averaging <135/85 or even less than 130/80.   Continue atorvastatin and aspirin  Stay off atenolol given your heart rate being low normal

## 2016-03-08 NOTE — Assessment & Plan Note (Signed)
LDL 75 pre stroke. Goal <70 so on atorvastatin 20mg  daily

## 2016-03-09 ENCOUNTER — Encounter (HOSPITAL_COMMUNITY)
Admission: RE | Admit: 2016-03-09 | Discharge: 2016-03-09 | Disposition: A | Discharge status: Home / Self Care | Payer: Medicare Other | Source: Ambulatory Visit | Attending: Vascular Surgery | Admitting: Vascular Surgery

## 2016-03-09 ENCOUNTER — Encounter (HOSPITAL_COMMUNITY): Payer: Self-pay

## 2016-03-09 DIAGNOSIS — Z01812 Encounter for preprocedural laboratory examination: Secondary | ICD-10-CM | POA: Diagnosis not present

## 2016-03-09 HISTORY — DX: Cerebral infarction, unspecified: I63.9

## 2016-03-09 HISTORY — DX: Pneumonia, unspecified organism: J18.9

## 2016-03-09 HISTORY — DX: Peripheral vascular disease, unspecified: I73.9

## 2016-03-09 HISTORY — DX: Other complications of anesthesia, initial encounter: T88.59XA

## 2016-03-09 HISTORY — DX: Adverse effect of unspecified anesthetic, initial encounter: T41.45XA

## 2016-03-09 LAB — SURGICAL PCR SCREEN
MRSA, PCR: NEGATIVE
STAPHYLOCOCCUS AUREUS: NEGATIVE

## 2016-03-09 LAB — CBC
HCT: 38.4 % — ABNORMAL LOW (ref 39.0–52.0)
Hemoglobin: 12.8 g/dL — ABNORMAL LOW (ref 13.0–17.0)
MCH: 31.2 pg (ref 26.0–34.0)
MCHC: 33.3 g/dL (ref 30.0–36.0)
MCV: 93.7 fL (ref 78.0–100.0)
Platelets: 343 10*3/uL (ref 150–400)
RBC: 4.1 MIL/uL — ABNORMAL LOW (ref 4.22–5.81)
RDW: 12.5 % (ref 11.5–15.5)
WBC: 4.3 10*3/uL (ref 4.0–10.5)

## 2016-03-09 LAB — URINALYSIS, ROUTINE W REFLEX MICROSCOPIC
Bilirubin Urine: NEGATIVE
GLUCOSE, UA: NEGATIVE mg/dL
HGB URINE DIPSTICK: NEGATIVE
Ketones, ur: 5 mg/dL — AB
Leukocytes, UA: NEGATIVE
Nitrite: NEGATIVE
PROTEIN: NEGATIVE mg/dL
Specific Gravity, Urine: 1.026 (ref 1.005–1.030)
pH: 5 (ref 5.0–8.0)

## 2016-03-09 LAB — COMPREHENSIVE METABOLIC PANEL
ALT: 18 U/L (ref 17–63)
AST: 23 U/L (ref 15–41)
Albumin: 3.3 g/dL — ABNORMAL LOW (ref 3.5–5.0)
Alkaline Phosphatase: 51 U/L (ref 38–126)
Anion gap: 7 (ref 5–15)
BUN: 21 mg/dL — ABNORMAL HIGH (ref 6–20)
CHLORIDE: 106 mmol/L (ref 101–111)
CO2: 26 mmol/L (ref 22–32)
Calcium: 9 mg/dL (ref 8.9–10.3)
Creatinine, Ser: 0.81 mg/dL (ref 0.61–1.24)
GFR calc non Af Amer: >60 mL/min (ref >60)
Glucose, Bld: 103 mg/dL — ABNORMAL HIGH (ref 65–99)
Potassium: 3.9 mmol/L (ref 3.5–5.1)
SODIUM: 139 mmol/L (ref 135–145)
Total Bilirubin: 0.6 mg/dL (ref 0.3–1.2)
Total Protein: 6.9 g/dL (ref 6.5–8.1)

## 2016-03-09 LAB — PROTIME-INR
INR: 1.02
Prothrombin Time: 13.4 seconds (ref 11.4–15.2)

## 2016-03-09 LAB — ABO/RH: ABO/RH(D): O POS

## 2016-03-09 LAB — TYPE AND SCREEN
ABO/RH(D): O POS
Antibody Screen: NEGATIVE

## 2016-03-09 LAB — APTT: APTT: 40 s — AB (ref 24–36)

## 2016-03-12 ENCOUNTER — Ambulatory Visit: Payer: Medicare Other | Attending: Internal Medicine | Admitting: Occupational Therapy

## 2016-03-12 DIAGNOSIS — M6281 Muscle weakness (generalized): Secondary | ICD-10-CM | POA: Diagnosis not present

## 2016-03-12 DIAGNOSIS — I69352 Hemiplegia and hemiparesis following cerebral infarction affecting left dominant side: Secondary | ICD-10-CM | POA: Diagnosis not present

## 2016-03-12 DIAGNOSIS — R208 Other disturbances of skin sensation: Secondary | ICD-10-CM | POA: Diagnosis not present

## 2016-03-12 DIAGNOSIS — R278 Other lack of coordination: Secondary | ICD-10-CM

## 2016-03-12 NOTE — Therapy (Signed)
[facility] [address] [place], [place], [address] Phone: [phone]   Fax:  [phone]  Occupational Therapy Treatment  Patient Details  Name: Ricky [name]. MRN: [id number] Date of Birth: Mar 06, 1945 Referring Provider: Dr. Garret Reddish (PCP) (Dr. Irine Seal (hospitalist))  Encounter Date: 03/12/2016      OT End of Session - 03/12/16 0805    Visit Number 3   Number of Visits 17   Authorization Type Medicare, G-code needed   Authorization - Visit Number 3   Authorization - Number of Visits 10   OT Start Time 0802   OT Stop Time 0845   OT Time Calculation (min) 43 min   Activity Tolerance Patient tolerated treatment well   Behavior During Therapy [facility] for tasks assessed/performed      Past Medical History:  Diagnosis Date  . Arthritis   . Complication of anesthesia    slow to wake up after shoulder surgeries  . Hypertension   . Peripheral vascular disease ([place])   . Pneumonia   . Stroke [facility])    still has decreased sensation in left hand and arm (as of 03/09/16)    Past Surgical History:  Procedure Laterality Date  . SHOULDER SURGERY Bilateral    left- injections intermittent now  Rotator cuff repair on both shoulders, 2 different surgeries  . TONSILLECTOMY    . TOTAL KNEE ARTHROPLASTY Left 12/14/2013   Procedure: LEFT TOTAL KNEE ARTHROPLASTY;  Surgeon: Gearlean Alf, MD;  Location: [facility] ORS;  Service: Orthopedics;  Laterality: Left;    There were no vitals filed for this visit.      Subjective Assessment - 03/12/16 0804    Subjective  Pt reports surgery 2/12 (carotid)--Reviewed that pt would need MD clearance to return to OT   Pertinent History HTN, L knee replacement, hx of bilateral RTC surgeries (limitations with overhead lifting)   Currently in Pain? No/denies      Reviewed coordination HEP and shoulder strengthening HEP.  In standing, bilateral shoulder flex, abduction, and chest  press with BUEs with 1lb wt each UE with min-mod cueing for L shoulder compensation.  Cautioned against compensation and instructed pt to use slow, controlled movements. 2 sets of 10 each.                OT Treatments/Exercises (OP) - 03/12/16 0001      Fine Motor Coordination   Fine Motor Coordination Flipping cards;Dealing card with thumb;Picking up coins;Manipulating coins;Stacking coins;Tossing ball   Flipping cards with min difficulty   Dealing card with thumb with min-mod difficulty, min cues to do with 1 push   Manipulating coins in hand to place in coin bank 1 at a time   Stacking coins with min-mod difficulty    Tossing ball between hands and in just L hand with min difficulty   Other Fine Motor Exercises Rotating ball in fingertips each direction with no significant difficulty                OT Education - 03/12/16 0809    Education Details Green putty HEP--see pt instructions   Person(s) Educated Patient   Methods Explanation;Handout;Verbal cues   Comprehension Verbalized understanding;Returned demonstration  mod-max difficulty with removing pegs from putty due to decr sensation          OT Short Term Goals - 03/05/16 0925      OT SHORT TERM GOAL #1   Title Pt will be independent with initial HEP for  coordination, LUE strength.--check STGs 03/04/16   Time 4   Period Weeks   Status New     OT SHORT TERM GOAL #2   Title Pt will improve L hand coordination for ADLs as shown by improving time on 9-hole peg test by at least 15sec.   Baseline 78.28sec   Time 4   Period Weeks   Status New     OT SHORT TERM GOAL #3   Title Pt will improve L coordination/functional reaching for ADLs as shown by improving score on box and blocks test by at least 10.   Baseline R-62, L-40 blocks   Time 4   Period Weeks   Status New     OT SHORT TERM GOAL #4   Title Pt will be able to write 3 sentences with at least 95% legibility with incr time prn.   Baseline  approx 85%   Time 4   Period Weeks   Status New     OT SHORT TERM GOAL #5   Title Pt will improve ability to complete dressing fasteners as shown by fastening/unfastening 3 buttons in 55sec or less.   Baseline 68.56sec   Time 4   Period Weeks   Status New     Additional Short Term Goals   Additional Short Term Goals Yes     OT SHORT TERM GOAL #6   Title Pt will be able to carry cup of water at least 200 feet with cognitive task without spills.   Time 4   Period Weeks   Status New           OT Long Term Goals - 03/05/16 0929      OT LONG TERM GOAL #1   Title Pt will verbalize understanding of strategies/AE to incr ease/safety with ADLs/IADLs prn.--check LTGs 05/03/16   Time 8   Period Weeks   Status New     OT LONG TERM GOAL #2   Title Pt will improve L hand coordination for ADLs as shown by improving time on 9-hole peg test by at least 25sec.   Baseline 78.28sec   Time 8   Period Weeks   Status New     OT LONG TERM GOAL #3   Title Pt will report no significant difficulty completing grooming tasks with dominant LUE.   Baseline 8   Period Weeks   Status New     OT LONG TERM GOAL #4   Title Pt will be able to write 5 sentences with at least 100% legibility with incr time prn.   Baseline approx 85%   Time 8   Period Weeks   Status New     OT LONG TERM GOAL #5   Title Pt will improve ability to complete dressing fasteners as shown by fastening/unfastening 3 buttons in 45sec or less.   Baseline 68.56sec   Period Weeks   Status New     Long Term Additional Goals   Additional Long Term Goals Yes     OT LONG TERM GOAL #6   Title Pt report ability to participate in previous cooking/cleaning tasks mod I.   Time 8   Period Weeks   Status New               Plan - 03/12/16 [id number]    Clinical Impression Statement Pt is progressing towards goals and verbalized understanding of HEP.   Rehab Potential Good   Clinical Impairments Affecting Rehab Potential  sensation deficits   OT Frequency  2x / week   OT Duration 8 weeks   OT Treatment/Interventions Cryotherapy;Parrafin;Therapeutic exercise;DME and/or AE instruction;Therapist, nutritional;Therapeutic activities;Patient/family education;Manual Therapy;Neuromuscular education;Fluidtherapy;Ultrasound;Electrical Stimulation;Moist Heat;Contrast Bath;Energy conservation;Passive range of motion;Therapeutic exercises   Plan continue with coordination/sensory re-training (carrying items, rotating 2 balls in hand, tracing shapes, flipping cards between fingers, etc)   OT Home Exercise Plan Education provided:  Coordination HEP, Green putty HEP, light shoulder strengthening HEP   Consulted and Agree with Plan of Care Patient;Family member/caregiver   Family Member Consulted wife      Patient will benefit from skilled therapeutic intervention in order to improve the following deficits and impairments:  Decreased activity tolerance, Decreased coordination, Decreased knowledge of use of DME, Decreased safety awareness, Decreased strength, Impaired UE functional use, Impaired sensation  Visit Diagnosis: Other lack of coordination  Other disturbances of skin sensation  Muscle weakness (generalized)  Hemiplegia and hemiparesis following cerebral infarction affecting left dominant side [facility])    Problem List Patient Active Problem List   Diagnosis Date Noted  . Hemiparesis affecting dominant side as late effect of stroke ([place]) 03/08/2016  . Hyperlipidemia 03/08/2016  . Occlusion and stenosis of carotid artery 02/29/2016  . History of CVA (cerebrovascular accident) 02/28/2016  . Essential hypertension 12/20/2014  . OA (osteoarthritis) of knee 12/14/2013    [facility] 03/12/2016, 8:59 AM  [facility] [address] [place], [place], [address] Phone: [phone]   Fax:  [phone]  Name: Ricky [name]. MRN: [id number] Date  of Birth: Dec 23, 1945   [name], OTR/L [facility] [address] [place],   [address] [phone] phone [phone] 03/12/16 8:59 AM

## 2016-03-12 NOTE — Patient Instructions (Signed)
1. Grip Strengthening (Resistive Putty)   Squeeze putty using thumb and all fingers. Repeat 20 times. Do 1 sessions per day.   Extension (Assistive Putty)   Roll putty back and forth, being sure to use all fingertips. Repeat 3 times. Do 1 sessions per day.  Then pinch as below.   Palmar Pinch Strengthening (Resistive Putty)   Pinch putty between thumb and each fingertip in turn after rolling out    Finger and Thumb Extension (Resistive Putty)   With thumb and all fingers in center of putty donut, stretch out. Repeat 10 times. Do 1 sessions per day.  MP Flexion (Resistive Putty)   Bending only at large knuckles, press putty down against thumb. Keep fingertips straight. Repeat 10 times. Do 2 sessions per day.      Place coins/buttons/beads in putty and remove using only Left hand.

## 2016-03-15 ENCOUNTER — Ambulatory Visit: Payer: Medicare Other | Admitting: Occupational Therapy

## 2016-03-15 DIAGNOSIS — R278 Other lack of coordination: Secondary | ICD-10-CM

## 2016-03-15 DIAGNOSIS — R208 Other disturbances of skin sensation: Secondary | ICD-10-CM

## 2016-03-15 DIAGNOSIS — I69352 Hemiplegia and hemiparesis following cerebral infarction affecting left dominant side: Secondary | ICD-10-CM

## 2016-03-15 DIAGNOSIS — M6281 Muscle weakness (generalized): Secondary | ICD-10-CM | POA: Diagnosis not present

## 2016-03-15 NOTE — Therapy (Signed)
[facility] [address] [place], [place], [address] Phone: [phone]   Fax:  [phone]  Occupational Therapy Treatment  Patient Details  Name: Ricky [name]. MRN: [id number] Date of Birth: 09/12/1945 Referring Provider: Dr. Garret Reddish (PCP) (Dr. Irine Seal (hospitalist))  Encounter Date: 03/15/2016      OT End of Session - 03/15/16 0947    Visit Number 4   Number of Visits 17   Authorization Type Medicare, G-code needed   Authorization - Visit Number 4   Authorization - Number of Visits 10   OT Start Time [phone]   OT Stop Time 1015   OT Time Calculation (min) 41 min   Activity Tolerance Patient tolerated treatment well   Behavior During Therapy [facility] for tasks assessed/performed      Past Medical History:  Diagnosis Date  . Arthritis   . Complication of anesthesia    slow to wake up after shoulder surgeries  . Hypertension   . Peripheral vascular disease ([place])   . Pneumonia   . Stroke [facility])    still has decreased sensation in left hand and arm (as of 03/09/16)    Past Surgical History:  Procedure Laterality Date  . SHOULDER SURGERY Bilateral    left- injections intermittent now  Rotator cuff repair on both shoulders, 2 different surgeries  . TONSILLECTOMY    . TOTAL KNEE ARTHROPLASTY Left 12/14/2013   Procedure: LEFT TOTAL KNEE ARTHROPLASTY;  Surgeon: Gearlean Alf, MD;  Location: [facility] ORS;  Service: Orthopedics;  Laterality: Left;    There were no vitals filed for this visit.      Subjective Assessment - 03/15/16 0946    Subjective  Pt reports that he has difficulty doing coordination HEP without hiking shoulder, but that he is more sucessful with eating.   Pertinent History HTN, L knee replacement, hx of bilateral RTC surgeries (limitations with overhead lifting)   Currently in Pain? No/denies                      OT Treatments/Exercises (OP) - 03/15/16 0001      Fine  Motor Coordination   Fine Motor Coordination Small Pegboard;In hand manipuation training   In Hand Manipulation Training Rotating relaxation balls in L hand each direction.  Pt needed mod cueing for harder direction, but improved performance after cues.  Pt instructed in ways to progress this to incr challenge with sensation (performing without looking and then with ambulation).  Pt verbalized understanding.   Small Pegboard Placing small pegs in pegboard to copy design with mod difficulty with in-hand manipulation   Other Fine Motor Exercises Tracing simple shapes and completing distal finger control sheet for incr pen control for writing with min-mod difficulty.       Sensation Exercises   Sensory Retraining Ambulating while carrying plate with loose objects with direction changes in min-mod busy environment without spills.  Writing using a       Reviewed importance of proper positioning with shoulder exercises to prevent injury and reviewed recommendation for seated stepper/arm bike for safe exercise or to perform light shoulder strengthening HEP with holding wt. In BUE (closed chain so that RUE can assist LUE).  However, as pt is scheduled for carotid endarterectomy Monday, pt instructed to obtain specific clearance prior to doing lifting HEP.  Also advised pt to obtain written clearance prior to returning to OT.  Pt verbalized understanding.  OT Short Term Goals - 03/05/16 0925      OT SHORT TERM GOAL #1   Title Pt will be independent with initial HEP for coordination, LUE strength.--check STGs 03/04/16   Time 4   Period Weeks   Status New     OT SHORT TERM GOAL #2   Title Pt will improve L hand coordination for ADLs as shown by improving time on 9-hole peg test by at least 15sec.   Baseline 78.28sec   Time 4   Period Weeks   Status New     OT SHORT TERM GOAL #3   Title Pt will improve L coordination/functional reaching for ADLs as shown by improving score on box  and blocks test by at least 10.   Baseline R-62, L-40 blocks   Time 4   Period Weeks   Status New     OT SHORT TERM GOAL #4   Title Pt will be able to write 3 sentences with at least 95% legibility with incr time prn.   Baseline approx 85%   Time 4   Period Weeks   Status New     OT SHORT TERM GOAL #5   Title Pt will improve ability to complete dressing fasteners as shown by fastening/unfastening 3 buttons in 55sec or less.   Baseline 68.56sec   Time 4   Period Weeks   Status New     Additional Short Term Goals   Additional Short Term Goals Yes     OT SHORT TERM GOAL #6   Title Pt will be able to carry cup of water at least 200 feet with cognitive task without spills.   Time 4   Period Weeks   Status New           OT Long Term Goals - 03/05/16 0929      OT LONG TERM GOAL #1   Title Pt will verbalize understanding of strategies/AE to incr ease/safety with ADLs/IADLs prn.--check LTGs 05/03/16   Time 8   Period Weeks   Status New     OT LONG TERM GOAL #2   Title Pt will improve L hand coordination for ADLs as shown by improving time on 9-hole peg test by at least 25sec.   Baseline 78.28sec   Time 8   Period Weeks   Status New     OT LONG TERM GOAL #3   Title Pt will report no significant difficulty completing grooming tasks with dominant LUE.   Baseline 8   Period Weeks   Status New     OT LONG TERM GOAL #4   Title Pt will be able to write 5 sentences with at least 100% legibility with incr time prn.   Baseline approx 85%   Time 8   Period Weeks   Status New     OT LONG TERM GOAL #5   Title Pt will improve ability to complete dressing fasteners as shown by fastening/unfastening 3 buttons in 45sec or less.   Baseline 68.56sec   Period Weeks   Status New     Long Term Additional Goals   Additional Long Term Goals Yes     OT LONG TERM GOAL #6   Title Pt report ability to participate in previous cooking/cleaning tasks mod I.   Time 8   Period Weeks    Status New               Plan - 03/15/16 0948    Clinical Impression Statement  Pt is progressing towards goals and reports improved ability to eat.   Rehab Potential Good   Clinical Impairments Affecting Rehab Potential sensation deficits   OT Frequency 2x / week   OT Duration 8 weeks   OT Treatment/Interventions Cryotherapy;Parrafin;Therapeutic exercise;DME and/or AE instruction;Therapist, nutritional;Therapeutic activities;Patient/family education;Manual Therapy;Neuromuscular education;Fluidtherapy;Ultrasound;Electrical Stimulation;Moist Heat;Contrast Bath;Energy conservation;Passive range of motion;Therapeutic exercises   Plan continue with coordination/sensory re-training, strengthening   OT Home Exercise Plan Education provided:  Coordination HEP, Green putty HEP, light shoulder strengthening HEP   Consulted and Agree with Plan of Care Patient;Family member/caregiver   Family Member Consulted wife      Patient will benefit from skilled therapeutic intervention in order to improve the following deficits and impairments:  Decreased activity tolerance, Decreased coordination, Decreased knowledge of use of DME, Decreased safety awareness, Decreased strength, Impaired UE functional use, Impaired sensation  Visit Diagnosis: Other lack of coordination  Other disturbances of skin sensation  Hemiplegia and hemiparesis following cerebral infarction affecting left dominant side (HCC)  Muscle weakness (generalized)    Problem List Patient Active Problem List   Diagnosis Date Noted  . Hemiparesis affecting dominant side as late effect of stroke ([place]) 03/08/2016  . Hyperlipidemia 03/08/2016  . Occlusion and stenosis of carotid artery 02/29/2016  . History of CVA (cerebrovascular accident) 02/28/2016  . Essential hypertension 12/20/2014  . OA (osteoarthritis) of knee 12/14/2013    [facility] 03/15/2016, 12:56 PM  [facility] [address] [place], [place], [address] Phone: [phone]   Fax:  [phone]  Name: Ricky [name]. MRN: [id number] Date of Birth: 20-Mar-1945   [name], OTR/L [facility] [address] [place], [place]  [address] [phone] phone [phone] 03/15/16 12:56 PM

## 2016-03-18 ENCOUNTER — Encounter (HOSPITAL_COMMUNITY): Payer: Self-pay | Admitting: Anesthesiology

## 2016-03-18 MED ORDER — VANCOMYCIN HCL 10 G IV SOLR
1250.0000 mg | INTRAVENOUS | Status: AC
  Administered 2016-03-19: 1250 mg via INTRAVENOUS
  Filled 2016-03-18: qty 1250

## 2016-03-18 NOTE — Anesthesia Preprocedure Evaluation (Addendum)
Anesthesia Evaluation  Patient identified by MRN, date of birth, ID band Patient awake    Reviewed: Allergy & Precautions, NPO status , Patient's Chart, lab work & pertinent test results  History of Anesthesia Complications (+) PROLONGED EMERGENCE and history of anesthetic complications  Airway Mallampati: II  TM Distance: >3 FB Neck ROM: Full    Dental no notable dental hx. (+) Teeth Intact   Pulmonary pneumonia, resolved,    Pulmonary exam normal breath sounds clear to auscultation       Cardiovascular hypertension, Pt. on medications + Peripheral Vascular Disease   Rhythm:Regular Rate:Normal  Right carotid stenosis    Neuro/Psych Left hand weakness CVA, Residual Symptoms negative psych ROS   GI/Hepatic negative GI ROS, Neg liver ROS,   Endo/Other  Hyperlipidemia   Renal/GU negative Renal ROS  negative genitourinary   Musculoskeletal  (+) Arthritis , Osteoarthritis,    Abdominal   Peds  Hematology  (+) anemia ,   Anesthesia Other Findings   Reproductive/Obstetrics                            Anesthesia Physical Anesthesia Plan  ASA: III  Anesthesia Plan: General   Post-op Pain Management:    Induction: Intravenous  Airway Management Planned: Oral ETT  Additional Equipment: Arterial line  Intra-op Plan:   Post-operative Plan: Extubation in OR  Informed Consent: I have reviewed the patients History and Physical, chart, labs and discussed the procedure including the risks, benefits and alternatives for the proposed anesthesia with the patient or authorized representative who has indicated his/her understanding and acceptance.   Dental advisory given  Plan Discussed with: Anesthesiologist, CRNA and Surgeon  Anesthesia Plan Comments:        Anesthesia Quick Evaluation

## 2016-03-19 ENCOUNTER — Inpatient Hospital Stay (HOSPITAL_COMMUNITY)
Admission: RE | Admit: 2016-03-19 | Discharge: 2016-03-20 | DRG: 027 | Disposition: A | Discharge status: Home / Self Care | Payer: Medicare Other | Source: Ambulatory Visit | Attending: Vascular Surgery | Admitting: Vascular Surgery

## 2016-03-19 ENCOUNTER — Inpatient Hospital Stay (HOSPITAL_COMMUNITY): Payer: Medicare Other | Admitting: Anesthesiology

## 2016-03-19 ENCOUNTER — Encounter (HOSPITAL_COMMUNITY): Payer: Self-pay | Admitting: Critical Care Medicine

## 2016-03-19 ENCOUNTER — Encounter (HOSPITAL_COMMUNITY)
Admission: RE | Disposition: A | Discharge status: Home / Self Care | Payer: Self-pay | Source: Ambulatory Visit | Attending: Vascular Surgery

## 2016-03-19 DIAGNOSIS — I69354 Hemiplegia and hemiparesis following cerebral infarction affecting left non-dominant side: Principal | ICD-10-CM

## 2016-03-19 DIAGNOSIS — Z79899 Other long term (current) drug therapy: Secondary | ICD-10-CM

## 2016-03-19 DIAGNOSIS — Z96652 Presence of left artificial knee joint: Secondary | ICD-10-CM | POA: Diagnosis present

## 2016-03-19 DIAGNOSIS — I10 Essential (primary) hypertension: Secondary | ICD-10-CM | POA: Diagnosis not present

## 2016-03-19 DIAGNOSIS — Z888 Allergy status to other drugs, medicaments and biological substances status: Secondary | ICD-10-CM | POA: Diagnosis not present

## 2016-03-19 DIAGNOSIS — M179 Osteoarthritis of knee, unspecified: Secondary | ICD-10-CM | POA: Diagnosis not present

## 2016-03-19 DIAGNOSIS — I6521 Occlusion and stenosis of right carotid artery: Secondary | ICD-10-CM | POA: Diagnosis not present

## 2016-03-19 DIAGNOSIS — E785 Hyperlipidemia, unspecified: Secondary | ICD-10-CM | POA: Diagnosis not present

## 2016-03-19 HISTORY — PX: PATCH ANGIOPLASTY: SHX6230

## 2016-03-19 HISTORY — PX: ENDARTERECTOMY: SHX5162

## 2016-03-19 LAB — CBC
HCT: 32.1 % — ABNORMAL LOW (ref 39.0–52.0)
Hemoglobin: 10.6 g/dL — ABNORMAL LOW (ref 13.0–17.0)
MCH: 31 pg (ref 26.0–34.0)
MCHC: 33 g/dL (ref 30.0–36.0)
MCV: 93.9 fL (ref 78.0–100.0)
PLATELETS: 199 10*3/uL (ref 150–400)
RBC: 3.42 MIL/uL — AB (ref 4.22–5.81)
RDW: 13.3 % (ref 11.5–15.5)
WBC: 7.1 10*3/uL (ref 4.0–10.5)

## 2016-03-19 LAB — CREATININE, SERUM
CREATININE: 0.81 mg/dL (ref 0.61–1.24)
GFR calc Af Amer: >60 mL/min (ref >60)

## 2016-03-19 SURGERY — ENDARTERECTOMY, CAROTID
Anesthesia: General | Laterality: Right

## 2016-03-19 MED ORDER — SUCCINYLCHOLINE CHLORIDE 200 MG/10ML IV SOSY
PREFILLED_SYRINGE | INTRAVENOUS | Status: DC | PRN
  Administered 2016-03-19: 100 mg via INTRAVENOUS

## 2016-03-19 MED ORDER — MAGNESIUM SULFATE 2 GM/50ML IV SOLN: 2.0000 g | Freq: Every day | INTRAVENOUS | Status: DC | PRN

## 2016-03-19 MED ORDER — MIDAZOLAM HCL 2 MG/2ML IJ SOLN
INTRAMUSCULAR | Status: AC
  Filled 2016-03-19: qty 2

## 2016-03-19 MED ORDER — PHENOL 1.4 % MT LIQD: 1.0000 | OROMUCOSAL | Status: DC | PRN

## 2016-03-19 MED ORDER — SODIUM CHLORIDE 0.9 % IV SOLN: INTRAVENOUS | Status: DC

## 2016-03-19 MED ORDER — PROTAMINE SULFATE 10 MG/ML IV SOLN
INTRAVENOUS | Status: DC | PRN
  Administered 2016-03-19: 50 mg via INTRAVENOUS
  Administered 2016-03-19: 10 mg via INTRAVENOUS

## 2016-03-19 MED ORDER — METOPROLOL TARTRATE 5 MG/5ML IV SOLN: 2.0000 mg | INTRAVENOUS | Status: DC | PRN

## 2016-03-19 MED ORDER — SUGAMMADEX SODIUM 200 MG/2ML IV SOLN
INTRAVENOUS | Status: DC | PRN
  Administered 2016-03-19: 130 mg via INTRAVENOUS

## 2016-03-19 MED ORDER — LIDOCAINE HCL (PF) 1 % IJ SOLN
INTRAMUSCULAR | Status: AC
  Filled 2016-03-19: qty 30

## 2016-03-19 MED ORDER — ASPIRIN EC 325 MG PO TBEC
325.0000 mg | DELAYED_RELEASE_TABLET | Freq: Every day | ORAL | Status: DC
  Administered 2016-03-20: 325 mg via ORAL
  Filled 2016-03-19: qty 1

## 2016-03-19 MED ORDER — PHENYLEPHRINE HCL 10 MG/ML IJ SOLN
INTRAVENOUS | Status: DC | PRN
  Administered 2016-03-19: 10 ug/min via INTRAVENOUS

## 2016-03-19 MED ORDER — MORPHINE SULFATE (PF) 2 MG/ML IV SOLN: 2.0000 mg | INTRAVENOUS | Status: DC | PRN

## 2016-03-19 MED ORDER — LACTATED RINGERS IV SOLN
INTRAVENOUS | Status: DC | PRN
  Administered 2016-03-19: 07:00:00 via INTRAVENOUS

## 2016-03-19 MED ORDER — EPHEDRINE SULFATE-NACL 50-0.9 MG/10ML-% IV SOSY
PREFILLED_SYRINGE | INTRAVENOUS | Status: DC | PRN
  Administered 2016-03-19 (×2): 2.5 mg via INTRAVENOUS
  Administered 2016-03-19: 5 mg via INTRAVENOUS

## 2016-03-19 MED ORDER — DOCUSATE SODIUM 100 MG PO CAPS
100.0000 mg | ORAL_CAPSULE | Freq: Every day | ORAL | Status: DC
  Filled 2016-03-19: qty 1

## 2016-03-19 MED ORDER — SODIUM CHLORIDE 0.9 % IV SOLN
INTRAVENOUS | Status: DC | PRN
  Administered 2016-03-19: 08:00:00

## 2016-03-19 MED ORDER — OXYCODONE-ACETAMINOPHEN 5-325 MG PO TABS: 1.0000 | ORAL_TABLET | ORAL | Status: DC | PRN

## 2016-03-19 MED ORDER — FENTANYL CITRATE (PF) 100 MCG/2ML IJ SOLN
INTRAMUSCULAR | Status: AC
  Filled 2016-03-19: qty 2

## 2016-03-19 MED ORDER — PROMETHAZINE HCL 25 MG/ML IJ SOLN: 6.2500 mg | INTRAMUSCULAR | Status: DC | PRN

## 2016-03-19 MED ORDER — HEPARIN SODIUM (PORCINE) 1000 UNIT/ML IJ SOLN
INTRAMUSCULAR | Status: DC | PRN
  Administered 2016-03-19: 7000 [IU] via INTRAVENOUS

## 2016-03-19 MED ORDER — ACETAMINOPHEN 500 MG PO TABS
500.0000 mg | ORAL_TABLET | Freq: Four times a day (QID) | ORAL | Status: DC | PRN
  Administered 2016-03-20: 500 mg via ORAL
  Filled 2016-03-19: qty 1

## 2016-03-19 MED ORDER — ROCURONIUM BROMIDE 50 MG/5ML IV SOSY
PREFILLED_SYRINGE | INTRAVENOUS | Status: DC | PRN
  Administered 2016-03-19: 50 mg via INTRAVENOUS

## 2016-03-19 MED ORDER — PANTOPRAZOLE SODIUM 40 MG PO TBEC
40.0000 mg | DELAYED_RELEASE_TABLET | Freq: Every day | ORAL | Status: DC
  Administered 2016-03-19 – 2016-03-20 (×2): 40 mg via ORAL
  Filled 2016-03-19 (×2): qty 1

## 2016-03-19 MED ORDER — LIDOCAINE 2% (20 MG/ML) 5 ML SYRINGE
INTRAMUSCULAR | Status: AC
  Filled 2016-03-19: qty 10

## 2016-03-19 MED ORDER — AMLODIPINE BESYLATE 2.5 MG PO TABS: 2.5000 mg | ORAL_TABLET | Freq: Every day | ORAL | Status: DC

## 2016-03-19 MED ORDER — ONDANSETRON HCL 4 MG/2ML IJ SOLN
INTRAMUSCULAR | Status: DC | PRN
  Administered 2016-03-19: 4 mg via INTRAVENOUS

## 2016-03-19 MED ORDER — VANCOMYCIN HCL IN DEXTROSE 1-5 GM/200ML-% IV SOLN
1000.0000 mg | Freq: Two times a day (BID) | INTRAVENOUS | Status: AC
  Administered 2016-03-19 – 2016-03-20 (×2): 1000 mg via INTRAVENOUS
  Filled 2016-03-19 (×2): qty 200

## 2016-03-19 MED ORDER — ALUM & MAG HYDROXIDE-SIMETH 200-200-20 MG/5ML PO SUSP: 15.0000 mL | ORAL | Status: DC | PRN

## 2016-03-19 MED ORDER — LABETALOL HCL 5 MG/ML IV SOLN: 10.0000 mg | INTRAVENOUS | Status: DC | PRN

## 2016-03-19 MED ORDER — ONDANSETRON HCL 4 MG/2ML IJ SOLN: 4.0000 mg | Freq: Four times a day (QID) | INTRAMUSCULAR | Status: DC | PRN

## 2016-03-19 MED ORDER — ASPIRIN EC 325 MG PO TBEC: 325.0000 mg | DELAYED_RELEASE_TABLET | Freq: Every day | ORAL | Status: DC

## 2016-03-19 MED ORDER — CHLORHEXIDINE GLUCONATE CLOTH 2 % EX PADS: 6.0000 | MEDICATED_PAD | Freq: Once | CUTANEOUS | Status: DC

## 2016-03-19 MED ORDER — HYDRALAZINE HCL 20 MG/ML IJ SOLN
INTRAMUSCULAR | Status: AC
  Filled 2016-03-19: qty 1

## 2016-03-19 MED ORDER — POTASSIUM CHLORIDE CRYS ER 20 MEQ PO TBCR: 20.0000 meq | EXTENDED_RELEASE_TABLET | Freq: Every day | ORAL | Status: DC | PRN

## 2016-03-19 MED ORDER — ADULT MULTIVITAMIN W/MINERALS CH
1.0000 | ORAL_TABLET | Freq: Every day | ORAL | Status: DC
  Administered 2016-03-19 – 2016-03-20 (×2): 1 via ORAL
  Filled 2016-03-19 (×2): qty 1

## 2016-03-19 MED ORDER — FENTANYL CITRATE (PF) 100 MCG/2ML IJ SOLN
INTRAMUSCULAR | Status: AC
  Administered 2016-03-19: 25 ug via INTRAVENOUS
  Filled 2016-03-19: qty 2

## 2016-03-19 MED ORDER — PROTAMINE SULFATE 10 MG/ML IV SOLN
INTRAVENOUS | Status: AC
  Filled 2016-03-19: qty 10

## 2016-03-19 MED ORDER — GLYCOPYRROLATE 0.2 MG/ML IJ SOLN
INTRAMUSCULAR | Status: DC | PRN
  Administered 2016-03-19: 0.2 mg via INTRAVENOUS

## 2016-03-19 MED ORDER — FENTANYL CITRATE (PF) 100 MCG/2ML IJ SOLN
25.0000 ug | INTRAMUSCULAR | Status: DC | PRN
  Administered 2016-03-19 (×2): 25 ug via INTRAVENOUS

## 2016-03-19 MED ORDER — SODIUM CHLORIDE 0.9 % IV SOLN
500.0000 mL | Freq: Once | INTRAVENOUS | Status: AC | PRN
  Administered 2016-03-19: 500 mL via INTRAVENOUS

## 2016-03-19 MED ORDER — HEPARIN SODIUM (PORCINE) 1000 UNIT/ML IJ SOLN
INTRAMUSCULAR | Status: AC
  Filled 2016-03-19: qty 1

## 2016-03-19 MED ORDER — PROPOFOL 10 MG/ML IV BOLUS
INTRAVENOUS | Status: DC | PRN
  Administered 2016-03-19: 140 mg via INTRAVENOUS

## 2016-03-19 MED ORDER — EPHEDRINE 5 MG/ML INJ
INTRAVENOUS | Status: AC
  Filled 2016-03-19: qty 20

## 2016-03-19 MED ORDER — ACETAMINOPHEN 325 MG PO TABS: 325.0000 mg | ORAL_TABLET | ORAL | Status: DC | PRN

## 2016-03-19 MED ORDER — SODIUM CHLORIDE 0.9 % IV SOLN
0.0125 ug/kg/min | INTRAVENOUS | Status: DC
  Administered 2016-03-19: .1 ug/kg/min via INTRAVENOUS
  Filled 2016-03-19 (×3): qty 2000

## 2016-03-19 MED ORDER — ACETAMINOPHEN 325 MG RE SUPP
325.0000 mg | RECTAL | Status: DC | PRN
  Filled 2016-03-19: qty 2

## 2016-03-19 MED ORDER — MEPERIDINE HCL 25 MG/ML IJ SOLN: 6.2500 mg | INTRAMUSCULAR | Status: DC | PRN

## 2016-03-19 MED ORDER — SODIUM CHLORIDE 0.45 % IV SOLN
INTRAVENOUS | Status: DC
  Administered 2016-03-19: 13:00:00 via INTRAVENOUS
  Filled 2016-03-19: qty 1000

## 2016-03-19 MED ORDER — THROMBIN 20000 UNITS EX SOLR
CUTANEOUS | Status: AC
  Filled 2016-03-19: qty 20000

## 2016-03-19 MED ORDER — HYDRALAZINE HCL 20 MG/ML IJ SOLN: 5.0000 mg | INTRAMUSCULAR | Status: DC | PRN

## 2016-03-19 MED ORDER — GUAIFENESIN-DM 100-10 MG/5ML PO SYRP: 15.0000 mL | ORAL_SOLUTION | ORAL | Status: DC | PRN

## 2016-03-19 MED ORDER — ENOXAPARIN SODIUM 40 MG/0.4ML ~~LOC~~ SOLN: 40.0000 mg | SUBCUTANEOUS | Status: DC

## 2016-03-19 MED ORDER — ATORVASTATIN CALCIUM 20 MG PO TABS
20.0000 mg | ORAL_TABLET | Freq: Every day | ORAL | Status: DC
  Administered 2016-03-19: 20 mg via ORAL
  Filled 2016-03-19: qty 1

## 2016-03-19 MED ORDER — ROCURONIUM BROMIDE 50 MG/5ML IV SOSY
PREFILLED_SYRINGE | INTRAVENOUS | Status: AC
  Filled 2016-03-19: qty 10

## 2016-03-19 MED ORDER — 0.9 % SODIUM CHLORIDE (POUR BTL) OPTIME
TOPICAL | Status: DC | PRN
  Administered 2016-03-19: 1000 mL

## 2016-03-19 MED ORDER — LIDOCAINE HCL (CARDIAC) 20 MG/ML IV SOLN
INTRAVENOUS | Status: DC | PRN
  Administered 2016-03-19: 60 mg via INTRAVENOUS

## 2016-03-19 SURGICAL SUPPLY — 51 items
ADH SKN CLS APL DERMABOND .7 (GAUZE/BANDAGES/DRESSINGS) ×1
AGENT HMST SPONGE THK3/8 (HEMOSTASIS) ×1
CANISTER SUCTION 2500CC (MISCELLANEOUS) ×2 IMPLANT
CANNULA VESSEL 3MM 2 BLNT TIP (CANNULA) ×4 IMPLANT
CATH ROBINSON RED A/P 18FR (CATHETERS) ×2 IMPLANT
CLIP TI MEDIUM 6 (CLIP) ×2 IMPLANT
CLIP TI WIDE RED SMALL 6 (CLIP) ×2 IMPLANT
CRADLE DONUT ADULT HEAD (MISCELLANEOUS) ×2 IMPLANT
DECANTER SPIKE VIAL GLASS SM (MISCELLANEOUS) IMPLANT
DERMABOND ADVANCED (GAUZE/BANDAGES/DRESSINGS) ×1
DERMABOND ADVANCED .7 DNX12 (GAUZE/BANDAGES/DRESSINGS) ×1 IMPLANT
DRAIN HEMOVAC 1/8 X 5 (WOUND CARE) IMPLANT
DRAPE INCISE IOBAN 66X45 STRL (DRAPES) ×1 IMPLANT
ELECT REM PT RETURN 9FT ADLT (ELECTROSURGICAL) ×2
ELECTRODE REM PT RTRN 9FT ADLT (ELECTROSURGICAL) ×1 IMPLANT
EVACUATOR SILICONE 100CC (DRAIN) IMPLANT
GLOVE BIO SURGEON STRL SZ 6.5 (GLOVE) ×1 IMPLANT
GLOVE BIO SURGEON STRL SZ7.5 (GLOVE) ×2 IMPLANT
GLOVE BIOGEL PI IND STRL 6.5 (GLOVE) IMPLANT
GLOVE BIOGEL PI IND STRL 7.0 (GLOVE) IMPLANT
GLOVE BIOGEL PI INDICATOR 6.5 (GLOVE) ×1
GLOVE BIOGEL PI INDICATOR 7.0 (GLOVE) ×1
GLOVE ECLIPSE 6.5 STRL STRAW (GLOVE) ×1 IMPLANT
GLOVE SURG SS PI 6.5 STRL IVOR (GLOVE) ×1 IMPLANT
GOWN STRL NON-REIN LRG LVL3 (GOWN DISPOSABLE) ×1 IMPLANT
GOWN STRL REUS W/ TWL LRG LVL3 (GOWN DISPOSABLE) ×3 IMPLANT
GOWN STRL REUS W/TWL LRG LVL3 (GOWN DISPOSABLE) ×6
HEMOSTAT SPONGE AVITENE ULTRA (HEMOSTASIS) ×2 IMPLANT
KIT BASIN OR (CUSTOM PROCEDURE TRAY) ×2 IMPLANT
KIT ROOM TURNOVER OR (KITS) ×2 IMPLANT
LOOP VESSEL MINI RED (MISCELLANEOUS) IMPLANT
NDL HYPO 25GX1X1/2 BEV (NEEDLE) IMPLANT
NEEDLE HYPO 25GX1X1/2 BEV (NEEDLE) IMPLANT
NS IRRIG 1000ML POUR BTL (IV SOLUTION) ×4 IMPLANT
PACK CAROTID (CUSTOM PROCEDURE TRAY) ×2 IMPLANT
PAD ARMBOARD 7.5X6 YLW CONV (MISCELLANEOUS) ×4 IMPLANT
PATCH HEMASHIELD 8X75 (Vascular Products) ×1 IMPLANT
SHUNT CAROTID BYPASS 10 (VASCULAR PRODUCTS) ×1 IMPLANT
SHUNT CAROTID BYPASS 12FRX15.5 (VASCULAR PRODUCTS) IMPLANT
SUT ETHILON 3 0 PS 1 (SUTURE) IMPLANT
SUT PROLENE 6 0 CC (SUTURE) ×2 IMPLANT
SUT PROLENE 7 0 BV 1 (SUTURE) IMPLANT
SUT PROLENE 7 0 BV1 MDA (SUTURE) IMPLANT
SUT SILK 3 0 TIES 17X18 (SUTURE)
SUT SILK 3-0 18XBRD TIE BLK (SUTURE) IMPLANT
SUT VIC AB 3-0 SH 27 (SUTURE) ×2
SUT VIC AB 3-0 SH 27X BRD (SUTURE) ×1 IMPLANT
SUT VICRYL 4-0 PS2 18IN ABS (SUTURE) ×2 IMPLANT
SYR CONTROL 10ML LL (SYRINGE) IMPLANT
TOWEL OR 17X24 6PK STRL BLUE (TOWEL DISPOSABLE) ×1 IMPLANT
WATER STERILE IRR 1000ML POUR (IV SOLUTION) ×2 IMPLANT

## 2016-03-19 NOTE — Op Note (Signed)
Procedure Right carotid endarterectomy  Preoperative diagnosis: Symptomatic right internal carotid artery stenosis  Postoperative diagnosis: Same  Anesthesia General  Asst.: Silva Bandy, [facility]  Operative findings: #1 greater than 70% right internal carotid stenosis                                #2 Dacron patch           #3 10 Fr shunt  Operative details: After obtaining informed consent, the patient was taken to the operating room. The patient was placed in a supine position on the operating room table. After induction of general anesthesia the patient's entire neck and chest was prepped and draped in the usual sterile fashion. An oblique incision was made on the right aspect of the patient's neck anterior to the border the right sternocleidomastoid muscle. The incision was carried into the subcutaneous tissues and through the platysma. The sternocleidomastoid muscle was identified and reflected laterally.  The common carotid artery was then found at the base of the incision this was dissected free circumferentially. It was fairly soft on palpation.  The vagus nerve was identified and protected. Dissection was then carried up to the level carotid bifurcation.   The hyperglossal nerve was well above the primary area of dissection. It was identified and protected.  The internal carotid artery was dissected free circumferentially just below the level of the hypoglossal nerve and it was soft in character at this location and above any palpable disease. A vessel loop was placed around this. Next the external carotid and superior thyroid arteries were dissected free circumferentially and vessel loops were placed around these. The patient was given 7000 units of intravenous heparin.  After 2 minutes of circulation time and raising the mean arterial pressure to 90 mm mercury, the distal internal carotid artery was controlled with small bulldog clamp. The external carotid and superior thyroid arteries were  controlled with vessel loops. The common carotid artery was controlled with a peripheral DeBakey clamp. A longitudinal opening was made in the common carotid artery just below the bifurcation. The arteriotomy was extended distally up into the internal carotid with Potts scissors. There was a large calcified plaque with greater than 70% stenosis in the internal carotid.  A 10 Fr shunt was brought onto the field and fashioned to fit the patient's artery.  This was threaded into the distal internal carotid artery and allowed to backbleed thoroughly.  There was pulsatile backbleeding.  This was then threaded into the common carotid and secured with a Rummel tourniquet.  There was a small amount of air in the shunt so the shunt was removed and allowed to backbleed again and the shunt insertion procedure repeated.  There was no air at this point and flow was restored to the brain.  Attention was then turned to the common carotid artery once again. A suitable endarterectomy plane was obtained and endarterectomy was begun in the common carotid artery and a good proximal endpoint was obtained. An eversion endarterectomy was performed on the external carotid artery and a good endpoint was obtained. The plaque was then elevated in the internal carotid artery and a nice feathered distal endpoint was also obtained.  The plaque was passed off the table. All loose debris was then removed from the carotid bed and everything was thoroughly irrigated with heparinized saline. A Dacron patch was then brought on to the operative field and this was sewn on as  a patch angioplasty using a running 6-0 Prolene suture. The shunt was occluded and removed.  The arteries were again controlled sequentially internal followed by common carotid.  Prior to completion of the anastomosis the internal carotid artery was thoroughly backbled. This was then controlled again with a fine bulldog clamp.  The common carotid was thoroughly flushed forward. The  external carotid was also thoroughly backbled.  The remainder of the patch was completed and the anastomosis was secured. Flow was then restored first retrograde from the external carotid into the carotid bed then antegrade from the common carotid to the external carotid artery and after approximately 5 cardiac cycles to the internal carotid artery. Doppler was used to evaluate the external/internal and common carotid arteries and these all had good Doppler flow. Hemostasis was obtained. The patient was also given 60 mg of Protamine.      The platysma muscle was reapproximated using a running 3-0 Vicryl suture. The skin was closed with 4 0 Vicryl subcuticular stitch.  The patient was awakened in the operating room and was moving upper and lower extremities symmetrically and following commands.  The patient was stable on arrival to the PACU.  Ruta Hinds, MD [facility] Office: [phone] Pager: [phone]

## 2016-03-19 NOTE — Progress Notes (Signed)
  Day of Surgery Note    Subjective:  No complaints  Vitals:   03/19/16 1130 03/19/16 1145  BP: 90/61 (!) 88/58  Pulse: (!) 43 (!) 41  Resp: 14 12  Temp: 97.8 F (36.6 C)     Incisions:   Clean and dry-no hematoma Extremities:  Moving all extremities equally Cardiac:  Regular/brady Lungs:  Non labored Neuro:  In tact; tongue deviation to the right   Assessment/Plan:  This is a 71 y.o. male who is s/p right CEA  -pt doing well in pacu -receiving a fluid bolus for pressure -some tongue deviation to the right   Leontine Locket, PA-C 03/19/2016 11:54 AM [phone]

## 2016-03-19 NOTE — Transfer of Care (Signed)
Immediate Anesthesia Transfer of Care Note  Patient: Ricky [name].  Procedure(s) Performed: Procedure(s): ENDARTERECTOMY CAROTID (Right) PATCH ANGIOPLASTY OF RIGHT CAROTID USING HEMASHIELD PLATINUM FINESSE (Right)  Patient Location: PACU  Anesthesia Type:General  Level of Consciousness: awake, alert  and oriented  Airway & Oxygen Therapy: Patient Spontanous Breathing and Patient connected to nasal cannula oxygen  Post-op Assessment: Report given to RN, Post -op Vital signs reviewed and stable, Patient moving all extremities X 4 and Patient able to stick tongue midline  Post vital signs: Reviewed and stable  Last Vitals:  Vitals:   03/19/16 0550  BP: (!) 167/77  Pulse: (!) 54  Resp: 20  Temp: 36.7 C   HR 61, BP 166/61, RR 15, Sats 95% on 2L [place]   Last Pain:  Vitals:   03/19/16 0550  TempSrc: Oral      Patients Stated Pain Goal: 2 ([id number])  Complications: No apparent anesthesia complications

## 2016-03-19 NOTE — Anesthesia Procedure Notes (Signed)
Procedure Name: Intubation Date/Time: 03/19/2016 7:28 AM Performed by: Merrilyn Puma B Pre-anesthesia Checklist: Patient identified, Emergency Drugs available, Suction available and Patient being monitored Patient Re-evaluated:Patient Re-evaluated prior to inductionOxygen Delivery Method: Circle System Utilized Preoxygenation: Pre-oxygenation with 100% oxygen Intubation Type: IV induction Ventilation: Oral airway inserted - appropriate to patient size and Mask ventilation without difficulty Laryngoscope Size: Miller and 2 Grade View: Grade I Tube type: Oral Tube size: 7.5 mm Number of attempts: 1 Airway Equipment and Method: Stylet and Oral airway Placement Confirmation: ETT inserted through vocal cords under direct vision,  positive ETCO2 and breath sounds checked- equal and bilateral Secured at: 23 cm Tube secured with: Tape Dental Injury: Teeth and Oropharynx as per pre-operative assessment

## 2016-03-19 NOTE — H&P (Signed)
Consult Note Date of Service: 02/29/2016 2:36 PM Ricky Dutch, MD  Vascular Surgery  Expand All Collapse All  Hide copied text  Hospital Consult       Reason for Consult:  CVA with carotid artery stenosis Referring Physician:  Neurology MRN #:  [id number]   History of Present Illness: This is a 71 y.o. male who presented to the ED yesterday with c/o left arm/hand clumsiness.  He states that he went to the gym and did his regular workout then went to the store and then home.  Once he was home, he noticed his left hand was clumsy .  He states he was dropping things and unable to hold on to objects.  He states that this lasted several hours.  He states he had some left lower extremity weakness as well.  He went to his PCP and he sent him to the hospital.  He states he continues to have numbness in his hand and still with difficulty with holding objects.  He denies any speech difficulties, temporary blindness or visual disturbances.  He did not have any trouble walking or issues with balance.  He is left hand dominant.   He denies any hx of irregular heart beat or palpitations.   His surgical hx consists of bilateral rotator cuff repair and left knee replacement.    He was on a beta blocker for hypertension prior to admission.  He states that the dose of the medication was cut in half this past December.        Past Medical History:  Diagnosis Date  . Arthritis    . Hypertension             Past Surgical History:  Procedure Laterality Date  . SHOULDER SURGERY        left- injections intermittent now  . TONSILLECTOMY      . TOTAL KNEE ARTHROPLASTY Left 12/14/2013    Procedure: LEFT TOTAL KNEE ARTHROPLASTY;  Surgeon: Gearlean Alf, MD;  Location: [facility];  Service: Orthopedics;  Laterality: Left;          Allergies  Allergen Reactions  . Amoxicillin Rash             Prior to Admission medications   Medication Sig Start Date End Date Taking? Authorizing Provider    acetaminophen (TYLENOL) 500 MG tablet Take 500 mg by mouth every 6 (six) hours as needed.     Yes Historical Provider, MD  atenolol (TENORMIN) 25 MG tablet Take 1 tablet (25 mg total) by mouth daily. 01/10/16   Yes Marin Olp, MD  Multiple Vitamin (MULTIVITAMIN) tablet Take 1 tablet by mouth daily.     Yes Historical Provider, MD      Social History         Social History  . Marital status: Married      Spouse name: N/A  . Number of children: N/A  . Years of education: N/A    Occupational History  . Not on file.          Social History Main Topics  . Smoking status: Never Smoker  . Smokeless tobacco: Never Used  . Alcohol use No        Comment: former drinker  . Drug use: No  . Sexual activity: Yes        Other Topics Concern  . Not on file       Social History Narrative    Married. 1 son. 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          Hobbies: gym daily- riding exercise bike plus weights/machines, photography- used to do a lot of black and white, gardening              Family History  Problem Relation Age of Onset  . Heart disease Father        in 40s- specifics unclear  . Other Mother        passed in late 49s      ROS: [x]  Positive   [ ]  Negative   [ ]  All sytems reviewed and are negative   Cardiovascular: []  chest pain/pressure []  palpitations []  SOB lying flat []  DOE []  pain in legs while walking []  pain in legs at rest []  pain in legs at night []  non-healing ulcers []  hx of DVT []  swelling in legs   Pulmonary: []  productive cough []  asthma/wheezing []  home O2   Neurologic: [x]  weakness in [x]  left hand/arm [x]  left leg [x]  decreased sensation left hand [x]  hx of CVA []  mini stroke [] difficulty speaking or slurred speech []  temporary loss of vision in one eye []  dizziness   Hematologic: []  hx of cancer []  bleeding problems []  problems with blood clotting  easily   Endocrine:   []  diabetes []  thyroid disease   GI []  vomiting blood []  blood in stool   GU: []  CKD/renal failure []  HD--[]  M/W/F or []  T/T/S []  burning with urination []  blood in urine   Psychiatric: []  anxiety []  depression   Musculoskeletal: [x]  arthritis []  joint pain   Integumentary: []  rashes []  ulcers   Constitutional: []  fever []  chills     Physical Examination   Vitals:   03/19/16 0550  BP: (!) 167/77  Pulse: (!) 54  Resp: 20  Temp: 98 F (36.7 C)  TempSrc: Oral  SpO2: 99%  Weight: 142 lb 3 oz (64.5 kg)  Height: 5' 5.5" (1.664 m)      General:  WDWN in NAD Gait: Not observed HENT: WNL, normocephalic Pulmonary: normal non-labored breathing, without Rales, rhonchi,  wheezing Cardiac: regular, without  Murmurs, rubs or gallops; without carotid bruits Abdomen:  soft, NT/ND, no masses Skin: without rashes Vascular Exam/Pulses:   Right Left  Radial 2+ (normal) 2+ (normal)  Ulnar Unable to palpate   Unable to palpate    DP 2+ (normal) 2+ (normal)  PT Unable to palpate   Unable to palpate      Extremities: without ischemic changes, without Gangrene , without cellulitis; without open wounds;  Musculoskeletal: no muscle wasting or atrophy       Neurologic: A&O X 3;  No focal weakness or paresthesias are detected; speech is fluent/normal Psychiatric:  The pt has Normal affect.     CBC Labs (Brief)          Component Value Date/Time    WBC 4.4 02/28/2016 1701    RBC 4.26 02/28/2016 1701    HGB 13.6 02/28/2016 1709    HCT 40.0 02/28/2016 1709    PLT 210 02/28/2016 1701    MCV 92.5 02/28/2016 1701    MCH 31.5 02/28/2016 1701    MCHC 34.0 02/28/2016 1701    RDW 12.9 02/28/2016 1701    LYMPHSABS 1.0 02/28/2016 1701    MONOABS 0.4 02/28/2016 1701    EOSABS 0.1 02/28/2016 1701    BASOSABS 0.0 02/28/2016 1701  BMET Labs (Brief)          Component Value Date/Time    NA 142 02/28/2016 1709    K 3.8 02/28/2016 1709    CL  103 02/28/2016 1709    CO2 26 02/28/2016 1701    GLUCOSE 122 (H) 02/28/2016 1709    BUN 18 02/28/2016 1709    CREATININE 0.80 02/28/2016 1709    CALCIUM 9.0 02/28/2016 1701    GFRNONAA >60 02/28/2016 1701    GFRAA >60 02/28/2016 1701        COAGS: Recent Labs       Lab Results  Component Value Date    INR 1.02 02/28/2016    INR 0.99 12/07/2013          Non-Invasive Vascular Imaging:   Carotid duplex 02/29/16: Carotid Duplex (Doppler) has been completed.  Preliminary findings: Right 40-59% ICA stenosis, high end of scale. Left 1-39% ICA stenosis. Antegrade vertebral flow.   MRI 02/28/16: 1. Acute moderate-size right MCA territory infarct involving the right insula as well as the right frontal and parietal lobes as above. No associated mass effect or hemorrhage. 2. Otherwise negative MRI of the brain. MRA HEAD IMPRESSION:   1. Occluded distal right M3 branch, compatible with acute right MCA infarction. 2. Hypoplastic/absent right A1 segment with overall diminutive right carotid artery system. 3. Otherwise negative MRA of the intracranial circulation. MRA NECK IMPRESSION:   1. Short-segment severe stenosis at the origin of the right ICA (estimated 80-90%). Stenosis begins at the bifurcation and measures approximately 5 mm in length. 2. Widely patent left carotid artery system. 3. Widely patent vertebral arteries within the neck.   TTE 02/29/16: - Left ventricle: The cavity size was normal. Wall thickness was   normal. Systolic function was normal. The estimated ejection   fraction was in the range of 60% to 65%. Wall motion was normal;   there were no regional wall motion abnormalities. Doppler   parameters are consistent with abnormal left ventricular   relaxation (grade 1 diastolic dysfunction). The E/e&' ratio is <8,   suggesting normal LV filling pressure. 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 There was  mild regurgitation. - Left atrium: The atrium was normal in size. - Tricuspid valve: There was mild regurgitation. - Pulmonary arteries: PA peak pressure: 32 mm Hg (S). - Inferior vena cava: The vessel was normal in size. The   respirophasic diameter changes were in the normal range (>= 50%),   consistent with normal central venous pressure.   Statin:  Yes.   this admission Beta Blocker:  Yes.   Aspirin:  Yes.   this admission ACEI:  No. ARB:  No. CCB use:  No Other antiplatelets/anticoagulants:  No.      ASSESSMENT/PLAN: This is a 71 y.o. male with symptomatic right carotid artery stenosis     -pt continues to have fine motor deficit of left hand; motor is in tact -carotid duplex reveals 40-59% right carotid artery stenosis, however MRI reveals a short segment of severe stenosis at the origin of the right ICA that begins at the bifurcation and measures 73mm in length.   -pt has been started on statin/aspirin  -Dr. Oneida Alar will be in to see the pt this afternoon       Leontine Locket, PA-C [facility] [phone]     History and exam findings as above.  Acute CVA most likely secondary to right ICA stenosis.  Has some clumsiness to leftt hand otherwise no deficits.  He has been started on ASA/statin.  He has good exercise tolerance with no chest pain or dyspnea so I do not believe he needs preop cardiac workup.  Benefit with CEA over 50% stenosis so discrepancy between MRI and [place] not really relevant.  Risk benefit possible complications discussed with pt including but not limited to stroke 3% MI 5% bleeding infection 1% cranial nerve injury 10%.  Right CEA today    Ruta Hinds, MD [facility] [place] Office: [phone] Pager: [phone]

## 2016-03-19 NOTE — Anesthesia Postprocedure Evaluation (Signed)
Anesthesia Post Note  Patient: Ricky [name].  Procedure(s) Performed: Procedure(s) (LRB): ENDARTERECTOMY CAROTID (Right) PATCH ANGIOPLASTY OF RIGHT CAROTID USING HEMASHIELD PLATINUM FINESSE (Right)  Patient location during evaluation: PACU Anesthesia Type: General Level of consciousness: awake and alert and oriented Pain management: pain level controlled Vital Signs Assessment: post-procedure vital signs reviewed and stable Respiratory status: spontaneous breathing, nonlabored ventilation, respiratory function stable and patient connected to nasal cannula oxygen Cardiovascular status: blood pressure returned to baseline and stable Postop Assessment: no signs of nausea or vomiting Anesthetic complications: no       Last Vitals:  Vitals:   03/19/16 1016 03/19/16 1031  BP: 127/73 126/61  Pulse: 90 (!) 49  Resp: 14 13  Temp:      Last Pain:  Vitals:   03/19/16 1031  TempSrc:   PainSc: 6                  Baneza Bartoszek A.

## 2016-03-19 NOTE — Progress Notes (Signed)
Awake alert in PACU Mild right tongue deviation No neck hematoma UE/LE 5/5 motor   Vitals:   03/19/16 0550 03/19/16 1001  BP: (!) 167/77 (!) 146/85  Pulse: (!) 54 62  Resp: 20 (!) 9  Temp: 98 F (36.7 C) 97.3 F (36.3 C)  TempSrc: Oral   SpO2: 99% 94%  Weight: 142 lb 3 oz (64.5 kg)   Height: 5' 5.5" (1.664 m)    Stable post op to stepdown when bed available  Ruta Hinds, MD [facility] [place] Office: [phone] Pager: [phone]

## 2016-03-19 NOTE — Progress Notes (Signed)
Fluid bolus started per orders for low BP per orders

## 2016-03-20 ENCOUNTER — Telehealth: Payer: Self-pay | Admitting: Vascular Surgery

## 2016-03-20 ENCOUNTER — Encounter (HOSPITAL_COMMUNITY): Payer: Self-pay | Admitting: Vascular Surgery

## 2016-03-20 LAB — BASIC METABOLIC PANEL WITH GFR
Anion gap: 11 (ref 5–15)
BUN: 10 mg/dL (ref 6–20)
CO2: 24 mmol/L (ref 22–32)
Calcium: 8.7 mg/dL — ABNORMAL LOW (ref 8.9–10.3)
Chloride: 105 mmol/L (ref 101–111)
Creatinine, Ser: 0.78 mg/dL (ref 0.61–1.24)
GFR calc Af Amer: >60 mL/min (ref >60)
GFR calc non Af Amer: >60 mL/min (ref >60)
Glucose, Bld: 95 mg/dL (ref 65–99)
Potassium: 3.7 mmol/L (ref 3.5–5.1)
Sodium: 140 mmol/L (ref 135–145)

## 2016-03-20 LAB — CBC
HCT: 35.7 % — ABNORMAL LOW (ref 39.0–52.0)
Hemoglobin: 11.7 g/dL — ABNORMAL LOW (ref 13.0–17.0)
MCH: 30.9 pg (ref 26.0–34.0)
MCHC: 32.8 g/dL (ref 30.0–36.0)
MCV: 94.2 fL (ref 78.0–100.0)
Platelets: 226 K/uL (ref 150–400)
RBC: 3.79 MIL/uL — ABNORMAL LOW (ref 4.22–5.81)
RDW: 13.4 % (ref 11.5–15.5)
WBC: 5.9 K/uL (ref 4.0–10.5)

## 2016-03-20 MED ORDER — OXYCODONE-ACETAMINOPHEN 5-325 MG PO TABS: 1.0000 | ORAL_TABLET | ORAL | 0 refills | Status: DC | PRN

## 2016-03-20 NOTE — Telephone Encounter (Signed)
Spoke to pt on home #, will mail letter as well for appt on 3/1 add on to day per Zigmund Daniel

## 2016-03-20 NOTE — Telephone Encounter (Signed)
Error

## 2016-03-20 NOTE — Care Management Note (Signed)
Case Management Note  Patient Details  Name: Ricky [name]. MRN: [id number] Date of Birth: 1945-09-08  Subjective/Objective:    S/p R  CEA, he is from home with spouse, pta idep, he has medication coverage and he has a pcp, he has transportation at [employer].  He is for dc today, no needs.             Action/Plan:   Expected Discharge Date:  03/20/16               Expected Discharge Plan:  Home/Self Care  In-House Referral:     Discharge planning Services  CM Consult  Post Acute Care Choice:    Choice offered to:     DME Arranged:    DME Agency:     HH Arranged:    HH Agency:     Status of Service:  Completed, signed off  If discussed at [employer] of Stay Meetings, dates discussed:    Additional Comments:  Zenon Mayo, RN 03/20/2016, 2:40 PM

## 2016-03-20 NOTE — Progress Notes (Addendum)
  [facility] Progress Note  Subjective  - POD #1  Doing well this morning.   Objective Vitals:   03/19/16 2000 03/19/16 2200  BP: 108/60 (!) 118/53  Pulse: (!) 48 (!) 47  Resp: 15 15  Temp:  98.7 F (37.1 C)    Intake/Output Summary (Last 24 hours) at 03/20/16 0746 Last data filed at 03/19/16 2000  Gross per 24 hour  Intake             2855 ml  Output              925 ml  Net             1930 ml   Right neck without hematoma. Tongue with slight right deviation. 5/5 strength upper and lower extremities bilaterally.   Assessment/Planning: 71 y.o. male is s/p: right carotid endarterectomy 1 Day Post-Op   Mild right tongue deviation this am. Otherwise neuro exam intact.  No issues with swallowing. Has ambulated and voided. BP stable this am. Sinus bradycardia stable. Reports that PCP recently switched him to CCB for bradycardia.  D/c home today.   Alvia Grove 03/20/2016 7:46 AM -- Agree with above. D/c home.  Ruta Hinds, MD [facility] [place] Office: [phone] Pager: [phone]  Laboratory CBC    Component Value Date/Time   WBC 5.9 03/20/2016 0400   HGB 11.7 (L) 03/20/2016 0400   HCT 35.7 (L) 03/20/2016 0400   PLT 226 03/20/2016 0400    BMET    Component Value Date/Time   NA 140 03/20/2016 0400   K 3.7 03/20/2016 0400   CL 105 03/20/2016 0400   CO2 24 03/20/2016 0400   GLUCOSE 95 03/20/2016 0400   BUN 10 03/20/2016 0400   CREATININE 0.78 03/20/2016 0400   CALCIUM 8.7 (L) 03/20/2016 0400   GFRNONAA >60 03/20/2016 0400   GFRAA >60 03/20/2016 0400    COAG Lab Results  Component Value Date   INR 1.02 03/09/2016   INR 1.02 02/28/2016   INR 0.99 12/07/2013   No results found for: PTT  Antibiotics Anti-infectives    Start     Dose/Rate Route Frequency Ordered Stop   03/19/16 1900  vancomycin (VANCOCIN) IVPB 1000 mg/200 mL premix     1,000 mg 200 mL/hr over 60 Minutes Intravenous Every  12 hours 03/19/16 1247 03/20/16 1859   03/19/16 0715  vancomycin (VANCOCIN) 1,250 mg in sodium chloride 0.9 % 250 mL IVPB     1,250 mg 166.7 mL/hr over 90 Minutes Intravenous 60 min pre-op 03/18/16 0756 03/19/16 0832       Virgina Jock, PA-C [facility] Office: [phone] Pager: [phone] 03/20/2016 7:46 AM

## 2016-03-20 NOTE — Progress Notes (Signed)
Patient discharged in wheelchair by volunteer services. 

## 2016-03-20 NOTE — Telephone Encounter (Signed)
-----   Message from Mena Goes, RN sent at 03/20/2016  1:02 PM EST ----- Regarding: RE: 2 weeks  Just add patient on to [facility] 04-05-16 schedule, he only has 20 right now. Needs to be seen by him because postop  ----- Message ----- From: Reginia Naas Sent: 03/20/2016  11:51 AM To: Mena Goes, RN Subject: RE: 2 weeks                                    CEF just does not have any appts in 2 wks, I can move a pt to pa in a little over 3 wks and get him in then or possibly add to the end of his schedule on the 28th?   please advise  ----- Message ----- From: Mena Goes, RN Sent: 03/20/2016  10:24 AM To: Lole[facility] Rose Admin Pool Subject: 2 weeks                                          ----- Message ----- From: Alvia Grove, PA-C Sent: 03/20/2016   7:52 AM To: [facility] Charge Pool  S/p right carotid endarterectomy 03/19/16  F/u with Dr. Oneida Alar in 2 weeks.  Thanks Maudie Mercury

## 2016-03-20 NOTE — Progress Notes (Signed)
Discharge education done with patient. Rn went over medications and when to take them, when to call the Md, wound care, driving restrictions, recommended diet, and information about carotid artery disease.

## 2016-03-23 NOTE — Discharge Summary (Signed)
[facility] Discharge Summary  Ricky [name]. 1945/06/19 71 y.o. male  [id number]  Admission Date: 03/19/2016  Discharge Date: 03/20/2016  Physician: Ruta Hinds, MD  Admission Diagnosis: Right carotid artery stenosis I65.21  HPI:   This is a 71 y.o. malewho presented to the ED yesterday with c/o left arm/hand clumsiness. He states that he went to the gym and did his regular workout then went to the store and then home. Once he was home, he noticed his left hand was clumsy . He states he was dropping things and unable to hold on to objects. He states that this lasted several hours. He states he had some left lower extremity weakness as well. He went to his PCP and he sent him to the hospital. He states he continues to have numbness in his hand and still with difficulty with holding objects. He denies any speech difficulties, temporary blindness or visual disturbances. He did not have any trouble walking or issues with balance. He is left hand dominant. He denies any hx of irregular heart beat or palpitations.  His surgical hx consists of bilateral rotator cuff repair and left knee replacement.   He was on a beta blocker for hypertension prior to admission. He states that the dose of the medication was cut in half this past December.   Hospital Course:  The patient was admitted to the hospital and taken to the operating room on 03/19/2016 and underwent right carotid endarterectomy.  The patient tolerated the procedure well and was transported to the PACU in stable condition.  He did have some right tongue deviation in the recovery room.   By POD 1, the patient's neuro status was intact except for continued right tongue deviation. This however was improved from the previous day. The patient felt like his tongue was "heavy." He denied any issues with swallowing. His blood pressure was stable and sinus bradycardia was stable. He was discharged home  on POD 1 in good condition.    Discharge Instructions:   The patient is discharged to home with extensive instructions on wound care and progressive ambulation.  They are instructed not to drive or perform any heavy lifting until returning to see the physician in his office.  Discharge Instructions    CAROTID Sugery: Call MD for difficulty swallowing or speaking; weakness in arms or legs that is a new symtom; severe headache.  If you have increased swelling in the neck and/or  are having difficulty breathing, CALL [phone]    Complete by:  As directed    Call MD for:  redness, tenderness, or signs of infection (pain, swelling, bleeding, redness, odor or green/yellow discharge around incision site)    Complete by:  As directed    Call MD for:  severe or increased pain, loss or decreased feeling  in affected limb(s)    Complete by:  As directed    Call MD for:  temperature >100.5    Complete by:  As directed    Discharge wound care:    Complete by:  As directed    Shower daily. Wash wound daily with soap and water and pat dry. Do not apply any creams or ointments on your incisions. Do not pull off the skin glue. It will peel off on its own.   Do not shave over your incision. Be very careful shaving around your incision. An Copy is preferable.   Driving Restrictions    Complete by:  As directed  No driving for 1 week and while on pain medication. No driving if feeling weak.   Increase activity slowly    Complete by:  As directed    Walk with assistance use walker or cane as needed   Lifting restrictions    Complete by:  As directed    No heavy lifting for 2 weeks   Resume previous diet    Complete by:  As directed       Discharge Diagnosis:  Right carotid artery stenosis I65.21  Secondary Diagnosis: Patient Active Problem List   Diagnosis Date Noted  . Carotid artery stenosis, symptomatic, right 03/19/2016  . Hemiparesis affecting dominant side as late effect of stroke  ([place]) 03/08/2016  . Hyperlipidemia 03/08/2016  . Occlusion and stenosis of carotid artery 02/29/2016  . History of CVA (cerebrovascular accident) 02/28/2016  . Essential hypertension 12/20/2014  . OA (osteoarthritis) of knee 12/14/2013   Past Medical History:  Diagnosis Date  . Arthritis   . Complication of anesthesia    slow to wake up after shoulder surgeries  . Hypertension   . Peripheral vascular disease ([place])   . Pneumonia   . Stroke [facility])    still has decreased sensation in left hand and arm (as of 03/09/16)    Allergies as of 03/20/2016      Reactions   Amoxicillin Rash   Has patient had a PCN reaction causing immediate rash, facial/tongue/throat swelling, SOB or lightheadedness with hypotension:unsure Has patient had a PCN reaction causing severe rash involving mucus membranes or skin necrosis:unsure Has patient had a PCN reaction that required hospitalization No Has patient had a PCN reaction occurring within the last 10 years: No If all of the above answers are "NO", then may proceed with Cephalosporin use.      Medication List    TAKE these medications   acetaminophen 500 MG tablet Commonly known as:  TYLENOL Take 500-1,000 mg by mouth every 6 (six) hours as needed (for pain.).   amLODipine 2.5 MG tablet Commonly known as:  NORVASC Take 1 tablet (2.5 mg total) by mouth daily.   aspirin EC 325 MG tablet Take 1 tablet (325 mg total) by mouth daily.   atorvastatin 20 MG tablet Commonly known as:  LIPITOR Take 1 tablet (20 mg total) by mouth daily at 6 PM.   multivitamin with minerals Tabs tablet Take 1 tablet by mouth daily.   oxyCODONE-acetaminophen 5-325 MG tablet Commonly known as:  PERCOCET/ROXICET Take 1-2 tablets by mouth every 4 (four) hours as needed for moderate pain.       Percocet #8 No Refill  Disposition: Home  Patient's condition: is Good  Follow up: 1. Dr.  Oneida Alar in 2 weeks.   Virgina Jock, PA-C [facility] [phone]  --- For [facility] use --- Instructions: Press F2 to tab through selections.  Delete question if not applicable.   Modified Rankin score at D/C (0-6): 0  IV medication needed for:  1. Hypertension: No 2. Hypotension: No  Post-op Complications: No  1. Post-op CVA or TIA: No  2. CN injury: Yes  If yes: CN XII injured   3. Myocardial infarction: No  4.  CHF: No  5.  Dysrhythmia (new): No  6. Wound infection: No  7. Reperfusion symptoms: No  8. Return to OR: No  Discharge medications: Statin use:  Yes If No: [ ]  For Medical reasons, [ ]  Non-compliant, [ ]  Not-indicated ASA use:  Yes  If No: [ ]   For Medical reasons, [ ]  Non-compliant, [ ]  Not-indicated Beta blocker use:  No If No: [ ]  For Medical reasons, [ ]  Non-compliant, [x ] Not-indicated ACE-Inhibitor use:  No If No: [ ]  For Medical reasons, [ ]  Non-compliant, [x ] Not-indicated P2Y12 Antagonist use: No, [ ]  Plavix, [ ]  Plasugrel, [ ]  Ticlopinine, [ ]  Ticagrelor, [ ]  Other, [ ]  No for medical reason, [ ]  Non-compliant, [x ] Not-indicated   Anti-coagulant use:  No, [ ]  Warfarin, [ ]  Rivaroxaban, [ ]  Dabigatran, [ ]  Other, [ ]  No for medical reason, [ ]  Non-compliant, [x ] Not-indicated

## 2016-03-30 ENCOUNTER — Encounter: Payer: Self-pay | Admitting: Vascular Surgery

## 2016-04-02 ENCOUNTER — Ambulatory Visit: Payer: Medicare Other | Admitting: Occupational Therapy

## 2016-04-02 DIAGNOSIS — I69352 Hemiplegia and hemiparesis following cerebral infarction affecting left dominant side: Secondary | ICD-10-CM

## 2016-04-02 DIAGNOSIS — R208 Other disturbances of skin sensation: Secondary | ICD-10-CM | POA: Diagnosis not present

## 2016-04-02 DIAGNOSIS — R278 Other lack of coordination: Secondary | ICD-10-CM

## 2016-04-02 DIAGNOSIS — M6281 Muscle weakness (generalized): Secondary | ICD-10-CM

## 2016-04-02 NOTE — Therapy (Signed)
[facility] [address] [place], [place], [address] Phone: [phone]   Fax:  [phone]  Occupational Therapy Treatment  Patient Details  Name: Ricky [name]. MRN: [id number] Date of Birth: 12-25-1945 Referring Provider: Dr. Garret Reddish (PCP) (Dr. Irine Seal (hospitalist))  Encounter Date: 04/02/2016      OT End of Session - 04/02/16 0814    Visit Number 5   Number of Visits 17   Authorization Type Medicare, G-code needed   Authorization - Visit Number 5   Authorization - Number of Visits 10   OT Start Time 0804   OT Stop Time 0845   OT Time Calculation (min) 41 min   Activity Tolerance Patient tolerated treatment well   Behavior During Therapy [facility] for tasks assessed/performed      Past Medical History:  Diagnosis Date  . Arthritis   . Complication of anesthesia    slow to wake up after shoulder surgeries  . Hypertension   . Peripheral vascular disease ([place])   . Pneumonia   . Stroke [facility])    still has decreased sensation in left hand and arm (as of 03/09/16)    Past Surgical History:  Procedure Laterality Date  . ENDARTERECTOMY Right 03/19/2016   Procedure: ENDARTERECTOMY CAROTID;  Surgeon: Elam Dutch, MD;  Location: [facility];  Service: Vascular;  Laterality: Right;  . PATCH ANGIOPLASTY Right 03/19/2016   Procedure: PATCH ANGIOPLASTY OF RIGHT CAROTID USING HEMASHIELD PLATINUM FINESSE;  Surgeon: Elam Dutch, MD;  Location: [facility];  Service: Vascular;  Laterality: Right;  . SHOULDER SURGERY Bilateral    left- injections intermittent now  Rotator cuff repair on both shoulders, 2 different surgeries  . TONSILLECTOMY    . TOTAL KNEE ARTHROPLASTY Left 12/14/2013   Procedure: LEFT TOTAL KNEE ARTHROPLASTY;  Surgeon: Gearlean Alf, MD;  [facility];  Service: Orthopedics;  Laterality: Left;    There were no vitals filed for this visit.      Subjective Assessment - 04/02/16 0809    Subjective  Pt reports that surgery went well.  Pt reports that he has had a little trouble moving tongue after surgery.   Appt with surgeon Friday.   Pertinent History HTN, L knee replacement, hx of bilateral RTC surgeries (limitations with overhead lifting)   Currently in Pain? No/denies            [facility] OT Assessment - 04/02/16 0001      Coordination   Left 9 Hole Peg Test 24.48   Box and Blocks L-55 blocks                  OT Treatments/Exercises (OP) - 04/02/16 0001      ADLs   Writing Practiced writing (copying 5 sentences) with good legibility, but min decr control at end of the lines compared to the beginning of the line.   ADL Comments Began checking STGs and discussing progress.--see goals section     Fine Motor Coordination   Fine Motor Coordination Grooved pegs   Small Pegboard Functional reaching overhead to place small pegs in vertical pegboard for incr coordination/activity tolerance with min difficulty with coordination, but fatigues quickly so finished on tabletop as pt demo compensation with fatigue.  Then removing pegs 3 at a time and manipulating in-hand for incr coordination.   Grooved pegs placing pegs in with min difficulty/compensation from shoulder      Sensation Exercises   Sensory Retraining Removing small pegs from red  putty for incr strength.  Pt demo improved ability to find/feel pegs in putty today.                OT Education - 04/02/16 0831    Education Details Sensory precautions (anti-cut gloves, alternative choppers, testing water temp. with R hand, using long oven mitts)   Person(s) Educated Patient   Methods Explanation   Comprehension Verbalized understanding          OT Short Term Goals - 04/02/16 [id number]      OT SHORT TERM GOAL #1   Title Pt will be independent with initial HEP for coordination, LUE strength.--check STGs 04/04/16   Time 4   Period Weeks   Status Achieved  04/02/16     OT SHORT TERM GOAL #2    Title Pt will improve L hand coordination for ADLs as shown by improving time on 9-hole peg test by at least 15sec.   Baseline 78.28sec   Time 4   Period Weeks   Status Achieved  04/02/16  24.48sec     OT SHORT TERM GOAL #3   Title Pt will improve L coordination/functional reaching for ADLs as shown by improving score on box and blocks test by at least 10.   Baseline R-62, L-40 blocks   Time 4   Period Weeks   Status Achieved  04/02/16: L-55 blocks     OT SHORT TERM GOAL #4   Title Pt will be able to write 3 sentences with at least 95% legibility with incr time prn.   Baseline approx 85%   Time 4   Period Weeks   Status Achieved  04/02/16     OT SHORT TERM GOAL #5   Title Pt will improve ability to complete dressing fasteners as shown by fastening/unfastening 3 buttons in 55sec or less.   Baseline 68.56sec   Time 4   Period Weeks   Status New     OT SHORT TERM GOAL #6   Title Pt will be able to carry cup of water at least 200 feet with cognitive task without spills.   Time 4   Period Weeks   Status New           OT Long Term Goals - 04/02/16 0845      OT LONG TERM GOAL #1   Title Pt will verbalize understanding of strategies/AE to incr ease/safety with ADLs/IADLs prn.--check LTGs 05/03/16   Time 8   Period Weeks   Status New     OT LONG TERM GOAL #2   Title Pt will improve L hand coordination for ADLs as shown by improving time on 9-hole peg test by at least 25sec.   Baseline 78.28sec   Time 8   Period Weeks   Status Achieved  04/02/16     OT LONG TERM GOAL #3   Title Pt will report no significant difficulty completing grooming tasks with dominant LUE.   Baseline 8   Period Weeks   Status New     OT LONG TERM GOAL #4   Title Pt will be able to write 5 sentences with at least 100% legibility with incr time prn.   Baseline approx 85%   Time 8   Period Weeks   Status New     OT LONG TERM GOAL #5   Title Pt will improve ability to complete dressing  fasteners as shown by fastening/unfastening 3 buttons in 45sec or less.   Baseline 68.56sec   Period Weeks  Status New     OT LONG TERM GOAL #6   Title Pt report ability to participate in previous cooking/cleaning tasks mod I.   Time 8   Period Weeks   Status New               Plan - 04/02/16 0815    Clinical Impression Statement Pt is progressing well with improving coordination.  Sensation appears improved as well.   Rehab Potential Good   Clinical Impairments Affecting Rehab Potential sensation deficits   OT Frequency 2x / week   OT Duration 8 weeks   OT Treatment/Interventions Cryotherapy;Parrafin;Therapeutic exercise;DME and/or AE instruction;Therapist, nutritional;Therapeutic activities;Patient/family education;Manual Therapy;Neuromuscular education;Fluidtherapy;Ultrasound;Electrical Stimulation;Moist Heat;Contrast Bath;Energy conservation;Passive range of motion;Therapeutic exercises   Plan continue with coordination/sensory re-training   OT Home Exercise Plan Education provided:  Coordination HEP, Green putty HEP, light shoulder strengthening HEP   Consulted and Agree with Plan of Care Patient;Family member/caregiver   Family Member Consulted wife      Patient will benefit from skilled therapeutic intervention in order to improve the following deficits and impairments:  Decreased activity tolerance, Decreased coordination, Decreased knowledge of use of DME, Decreased safety awareness, Decreased strength, Impaired UE functional use, Impaired sensation  Visit Diagnosis: Other lack of coordination  Other disturbances of skin sensation  Hemiplegia and hemiparesis following cerebral infarction affecting left dominant side (HCC)  Muscle weakness (generalized)    Problem List Patient Active Problem List   Diagnosis Date Noted  . Carotid artery stenosis, symptomatic, right 03/19/2016  . Hemiparesis affecting dominant side as late effect of stroke ([place])  03/08/2016  . Hyperlipidemia 03/08/2016  . Occlusion and stenosis of carotid artery 02/29/2016  . History of CVA (cerebrovascular accident) 02/28/2016  . Essential hypertension 12/20/2014  . OA (osteoarthritis) of knee 12/14/2013    [facility] 04/02/2016, 8:48 AM  [facility] [address] [place], [place], [address] Phone: [phone]   Fax:  [phone]  Name: Sajan Maloof. MRN: [id number] Date of Birth: Sep 02, 1945   [name], OTR/L [facility] [address] [place], [place]  [address] [phone] phone [phone] 04/02/16 8:48 AM

## 2016-04-05 ENCOUNTER — Encounter: Payer: Self-pay | Admitting: Vascular Surgery

## 2016-04-05 ENCOUNTER — Ambulatory Visit: Payer: Medicare Other | Admitting: Family Medicine

## 2016-04-05 ENCOUNTER — Ambulatory Visit (INDEPENDENT_AMBULATORY_CARE_PROVIDER_SITE_OTHER): Payer: Self-pay | Admitting: Vascular Surgery

## 2016-04-05 VITALS — BP 140/58 | HR 63 | Temp 97.4°F | Resp 20 | Ht 65.5 in | Wt 141.6 lb

## 2016-04-05 DIAGNOSIS — I6523 Occlusion and stenosis of bilateral carotid arteries: Secondary | ICD-10-CM

## 2016-04-05 NOTE — Progress Notes (Signed)
[facility] HISTORY AND PHYSICAL    History of Present Illness:  Patient is a 71 y.o. year old male who presents for post-operative follow-up after right carotid endarterectomy. This was done for symptomatic carotid stenosis with preoperative stroke. He has no residual symptoms. Denies headaches, numbness, tingling or other neuro deficits.  No swallowing problems.  No incisional drainage.  He still has some mild right tongue weakness but this continues to improve. He is on aspirin and Lipitor.   Physical Examination  Vitals:   04/05/16 1134 04/05/16 1135  BP: (!) 141/86 (!) 140/58  Pulse: 63   Resp: 20   Temp: 97.4 F (36.3 C)     Body mass index is 23.21 kg/m.  General:  Alert and oriented, no acute distress Neck: No bruit or JVD HEENT: Mild right tongue deviation Skin: No rash Extremities:   Neurologic: Upper and lower extremity motor 5/5 and symmetric  ASSESSMENT: Doing well status post right carotid endarterectomy. Minimal contralateral disease on recent duplex exam.   PLAN: Continue aspirin and Lipitor. Follow-up 6 months repeat carotid duplex scan   Ruta Hinds, MD [facility] Office: [phone] Pager: [phone]

## 2016-04-06 ENCOUNTER — Ambulatory Visit: Payer: Medicare Other | Attending: Internal Medicine | Admitting: Occupational Therapy

## 2016-04-06 DIAGNOSIS — R208 Other disturbances of skin sensation: Secondary | ICD-10-CM | POA: Insufficient documentation

## 2016-04-06 DIAGNOSIS — M6281 Muscle weakness (generalized): Secondary | ICD-10-CM | POA: Diagnosis not present

## 2016-04-06 DIAGNOSIS — I69352 Hemiplegia and hemiparesis following cerebral infarction affecting left dominant side: Secondary | ICD-10-CM | POA: Diagnosis not present

## 2016-04-06 DIAGNOSIS — R278 Other lack of coordination: Secondary | ICD-10-CM | POA: Insufficient documentation

## 2016-04-06 NOTE — Therapy (Signed)
[facility] [address] [place], [place], [address] Phone: [phone]   Fax:  [phone]  Occupational Therapy Treatment  Patient Details  Name: Ricky [name]. MRN: [id number] Date of Birth: July 07, 1945 Referring Provider: Dr. Garret Reddish (PCP) (Dr. Irine Seal (hospitalist))  Encounter Date: 04/06/2016      OT End of Session - 04/06/16 1247    Visit Number 6   Number of Visits 17   Date for OT Re-Evaluation 05/03/16   Authorization Type Medicare, G-code needed   Authorization - Visit Number 6   Authorization - Number of Visits 10   OT Start Time [id number]   OT Stop Time 1145   OT Time Calculation (min) 40 min      Past Medical History:  Diagnosis Date  . Arthritis   . Carotid artery occlusion   . Complication of anesthesia    slow to wake up after shoulder surgeries  . Hypertension   . Peripheral vascular disease ([place])   . Pneumonia   . Stroke [facility])    still has decreased sensation in left hand and arm (as of 03/09/16)    Past Surgical History:  Procedure Laterality Date  . CAROTID ENDARTERECTOMY    . ENDARTERECTOMY Right 03/19/2016   Procedure: ENDARTERECTOMY CAROTID;  Surgeon: Elam Dutch, MD;  Location: [facility];  Service: Vascular;  Laterality: Right;  . PATCH ANGIOPLASTY Right 03/19/2016   Procedure: PATCH ANGIOPLASTY OF RIGHT CAROTID USING HEMASHIELD PLATINUM FINESSE;  Surgeon: Elam Dutch, MD;  Location: [facility];  Service: Vascular;  Laterality: Right;  . SHOULDER SURGERY Bilateral    left- injections intermittent now  Rotator cuff repair on both shoulders, 2 different surgeries  . TONSILLECTOMY    . TOTAL KNEE ARTHROPLASTY Left 12/14/2013   Procedure: LEFT TOTAL KNEE ARTHROPLASTY;  Surgeon: Gearlean Alf, MD;  [facility];  Service: Orthopedics;  Laterality: Left;    There were no vitals filed for this visit.      Subjective Assessment - 04/06/16 1245    Subjective  Pt  reports MD cleared him to return to gym, therapsit reinforced that pt should exercise caution and adhere to previous therapist recommends  regarding UE exercises at gym   Patient is accompained by: Family member   Pertinent History HTN, L knee replacement, hx of bilateral RTC surgeries (limitations with overhead lifting)   Currently in Pain? No/denies            Treatment: Therapist finished checking progress towards goals. Pt demonstrates continued significant difficulty with fastening buttons. Pt practiced with shirt in front of him and while wearing shirt, min-mod v.c. Ambulating 200' with a cup of water without spills, pt practiced with a standard cup and a coffee mug, with good success. Pt ambulated 125' with mug of water in left hand a plate in the other hand, pt was able to successfully perform without spills or drops. Placing grooved pegs in pegboards with LUE mod difficulty, min v.c. For incr. Fine motor coordination. Retrieving items from rice with left hand and attempting to identify with vision occluded, pt was unable to correctly identify items due to sensory impairment.                   OT Short Term Goals - 04/06/16 1125      OT SHORT TERM GOAL #1   Title Pt will be independent with initial HEP for coordination, LUE strength.--check STGs 04/04/16   Time 4  Period Weeks   Status Achieved  04/02/16     OT SHORT TERM GOAL #2   Title Pt will improve L hand coordination for ADLs as shown by improving time on 9-hole peg test by at least 15sec.   Baseline 78.28sec   Time 4   Period Weeks   Status Achieved  04/02/16  24.48sec     OT SHORT TERM GOAL #3   Title Pt will improve L coordination/functional reaching for ADLs as shown by improving score on box and blocks test by at least 10.   Baseline R-62, L-40 blocks   Time 4   Period Weeks   Status Achieved  04/02/16: L-55 blocks     OT SHORT TERM GOAL #4   Title Pt will be able to write 3 sentences with  at least 95% legibility with incr time prn.   Baseline approx 85%   Time 4   Period Weeks   Status Achieved  04/02/16     OT SHORT TERM GOAL #5   Title Pt will improve ability to complete dressing fasteners as shown by fastening/unfastening 3 buttons in 55sec or less.   Baseline 68.56sec   Time 4   Period Weeks   Status On-going  68.71 secs     OT SHORT TERM GOAL #6   Title Pt will be able to carry cup of water at least 200 feet with cognitive task without spills.   Time 4   Period Weeks   Status Achieved           OT Long Term Goals - 04/02/16 0845      OT LONG TERM GOAL #1   Title Pt will verbalize understanding of strategies/AE to incr ease/safety with ADLs/IADLs prn.--check LTGs 05/03/16   Time 8   Period Weeks   Status New     OT LONG TERM GOAL #2   Title Pt will improve L hand coordination for ADLs as shown by improving time on 9-hole peg test by at least 25sec.   Baseline 78.28sec   Time 8   Period Weeks   Status Achieved  04/02/16     OT LONG TERM GOAL #3   Title Pt will report no significant difficulty completing grooming tasks with dominant LUE.   Baseline 8   Period Weeks   Status New     OT LONG TERM GOAL #4   Title Pt will be able to write 5 sentences with at least 100% legibility with incr time prn.   Baseline approx 85%   Time 8   Period Weeks   Status New     OT LONG TERM GOAL #5   Title Pt will improve ability to complete dressing fasteners as shown by fastening/unfastening 3 buttons in 45sec or less.   Baseline 68.56sec   Period Weeks   Status New     OT LONG TERM GOAL #6   Title Pt report ability to participate in previous cooking/cleaning tasks mod I.   Time 8   Period Weeks   Status New               Plan - 04/06/16 1125    Rehab Potential Good   Clinical Impairments Affecting Rehab Potential sensation deficits   OT Frequency 2x / week   OT Duration 8 weeks   OT Treatment/Interventions Cryotherapy;Parrafin;Therapeutic  exercise;DME and/or AE instruction;Therapist, nutritional;Therapeutic activities;Patient/family education;Manual Therapy;Neuromuscular education;Fluidtherapy;Ultrasound;Electrical Stimulation;Moist Heat;Contrast Bath;Energy conservation;Passive range of motion;Therapeutic exercises   OT Home Exercise Plan Education  provided:  Coordination HEP, Green putty HEP, light shoulder strengthening HEP   Consulted and Agree with Plan of Care Patient;Family member/caregiver   Family Member Consulted wife      Patient will benefit from skilled therapeutic intervention in order to improve the following deficits and impairments:  Decreased activity tolerance, Decreased coordination, Decreased knowledge of use of DME, Decreased safety awareness, Decreased strength, Impaired UE functional use, Impaired sensation  Visit Diagnosis: Other lack of coordination  Other disturbances of skin sensation  Hemiplegia and hemiparesis following cerebral infarction affecting left dominant side (HCC)  Muscle weakness (generalized)    Problem List Patient Active Problem List   Diagnosis Date Noted  . Carotid artery stenosis, symptomatic, right 03/19/2016  . Hemiparesis affecting dominant side as late effect of stroke ([place]) 03/08/2016  . Hyperlipidemia 03/08/2016  . Occlusion and stenosis of carotid artery 02/29/2016  . History of CVA (cerebrovascular accident) 02/28/2016  . Essential hypertension 12/20/2014  . OA (osteoarthritis) of knee 12/14/2013    Christalyn Goertz 04/06/2016, 12:47 PM  [facility] [address] [place], [place], [address] Phone: [phone]   Fax:  [phone]  Name: Ricky [name]. MRN: [id number] Date of Birth: Feb 23, 1945

## 2016-04-09 ENCOUNTER — Ambulatory Visit: Payer: Medicare Other | Admitting: Occupational Therapy

## 2016-04-09 DIAGNOSIS — R278 Other lack of coordination: Secondary | ICD-10-CM

## 2016-04-09 DIAGNOSIS — M6281 Muscle weakness (generalized): Secondary | ICD-10-CM | POA: Diagnosis not present

## 2016-04-09 DIAGNOSIS — I69352 Hemiplegia and hemiparesis following cerebral infarction affecting left dominant side: Secondary | ICD-10-CM | POA: Diagnosis not present

## 2016-04-09 DIAGNOSIS — R208 Other disturbances of skin sensation: Secondary | ICD-10-CM

## 2016-04-09 NOTE — Therapy (Deleted)
[facility] [address] [place], [place], [address] Phone: [phone]   Fax:  [phone]  Patient Details  Name: Ricky [name]. MRN: [id number] Date of Birth: 22-May-1945 Referring Provider:  Marin Olp, MD  Encounter Date: 04/09/2016   [name] 04/09/2016, 8:10 AM  [facility] [address] [place], [place], [address] Phone: [phone]   Fax:  Etna Green, OTR/L [facility] [address] [place], [place]  [address] [phone] phone [phone] 04/09/16 8:10 AM

## 2016-04-09 NOTE — Therapy (Signed)
[facility] [address] [place], [place], [address] Phone: [phone]   Fax:  [phone]  Occupational Therapy Treatment  Patient Details  Name: Ricky [name]. MRN: [id number] Date of Birth: 10-Aug-1945 Referring Provider: Dr. Garret Reddish (PCP) (Dr. Irine Seal (hospitalist))  Encounter Date: 04/09/2016      OT End of Session - 04/09/16 0808    Visit Number 7   Number of Visits 17   Date for OT Re-Evaluation 05/03/16   Authorization Type Medicare, G-code needed   Authorization - Visit Number 7   Authorization - Number of Visits 10   OT Start Time 0804   OT Stop Time 0845   OT Time Calculation (min) 41 min   Activity Tolerance Patient tolerated treatment well   Behavior During Therapy [facility] for tasks assessed/performed      Past Medical History:  Diagnosis Date  . Arthritis   . Carotid artery occlusion   . Complication of anesthesia    slow to wake up after shoulder surgeries  . Hypertension   . Peripheral vascular disease ([place])   . Pneumonia   . Stroke [facility])    still has decreased sensation in left hand and arm (as of 03/09/16)    Past Surgical History:  Procedure Laterality Date  . CAROTID ENDARTERECTOMY    . ENDARTERECTOMY Right 03/19/2016   Procedure: ENDARTERECTOMY CAROTID;  Surgeon: Elam Dutch, MD;  Location: [place];  Service: Vascular;  Laterality: Right;  . PATCH ANGIOPLASTY Right 03/19/2016   Procedure: PATCH ANGIOPLASTY OF RIGHT CAROTID USING HEMASHIELD PLATINUM FINESSE;  Surgeon: Elam Dutch, MD;  Location: [place];  Service: Vascular;  Laterality: Right;  . SHOULDER SURGERY Bilateral    left- injections intermittent now  Rotator cuff repair on both shoulders, 2 different surgeries  . TONSILLECTOMY    . TOTAL KNEE ARTHROPLASTY Left 12/14/2013   Procedure: LEFT TOTAL KNEE ARTHROPLASTY;  Surgeon: Gearlean Alf, MD;  [facility];  Service: Orthopedics;  Laterality: Left;     There were no vitals filed for this visit.      Subjective Assessment - 04/09/16 0806    Subjective  Pt reports that he has no restrictions or precautions (pt had follow-up with surgeon last Fri).   Pertinent History HTN, L knee replacement, hx of bilateral RTC surgeries (limitations with overhead lifting)   Currently in Pain? No/denies                      OT Treatments/Exercises (OP) - 04/09/16 0001      ADLs   UB Dressing Practiced fastening/unfastening buttons while wearing shirt with min-mod cueing for nomal movements.  Pt demo improvement with repetition/cueing.     Fine Motor Coordination   Fine Programmer, multimedia with min difficulty/incr time.   Other Fine Motor Exercises Functional reaching to place clothespins with 1-8lb resistance on vertical pole for incr strength/functional reaching and sensory re-training.   Other Fine Motor Exercises Ambulating while tossing ball between hands and then while juggling 2 balls with no significant difficulty for coordination and sensory retraining to be able to perform with divided attention.                  OT Short Term Goals - 04/06/16 1125      OT SHORT TERM GOAL #1   Title Pt will be independent with initial HEP for coordination, LUE strength.--check STGs 04/04/16  Time 4   Period Weeks   Status Achieved  04/02/16     OT SHORT TERM GOAL #2   Title Pt will improve L hand coordination for ADLs as shown by improving time on 9-hole peg test by at least 15sec.   Baseline 78.28sec   Time 4   Period Weeks   Status Achieved  04/02/16  24.48sec     OT SHORT TERM GOAL #3   Title Pt will improve L coordination/functional reaching for ADLs as shown by improving score on box and blocks test by at least 10.   Baseline R-62, L-40 blocks   Time 4   Period Weeks   Status Achieved  04/02/16: L-55 blocks     OT SHORT TERM GOAL #4   Title Pt will be able to write 3 sentences  with at least 95% legibility with incr time prn.   Baseline approx 85%   Time 4   Period Weeks   Status Achieved  04/02/16     OT SHORT TERM GOAL #5   Title Pt will improve ability to complete dressing fasteners as shown by fastening/unfastening 3 buttons in 55sec or less.   Baseline 68.56sec   Time 4   Period Weeks   Status On-going  68.71 secs     OT SHORT TERM GOAL #6   Title Pt will be able to carry cup of water at least 200 feet with cognitive task without spills.   Time 4   Period Weeks   Status Achieved           OT Long Term Goals - 04/02/16 0845      OT LONG TERM GOAL #1   Title Pt will verbalize understanding of strategies/AE to incr ease/safety with ADLs/IADLs prn.--check LTGs 05/03/16   Time 8   Period Weeks   Status New     OT LONG TERM GOAL #2   Title Pt will improve L hand coordination for ADLs as shown by improving time on 9-hole peg test by at least 25sec.   Baseline 78.28sec   Time 8   Period Weeks   Status Achieved  04/02/16     OT LONG TERM GOAL #3   Title Pt will report no significant difficulty completing grooming tasks with dominant LUE.   Baseline 8   Period Weeks   Status New     OT LONG TERM GOAL #4   Title Pt will be able to write 5 sentences with at least 100% legibility with incr time prn.   Baseline approx 85%   Time 8   Period Weeks   Status New     OT LONG TERM GOAL #5   Title Pt will improve ability to complete dressing fasteners as shown by fastening/unfastening 3 buttons in 45sec or less.   Baseline 68.56sec   Period Weeks   Status New     OT LONG TERM GOAL #6   Title Pt report ability to participate in previous cooking/cleaning tasks mod I.   Time 8   Period Weeks   Status New               Plan - 04/09/16 0808    Clinical Impression Statement Pt continues to progress with coordination/strength and is resuming normal activities.   Rehab Potential Good   Clinical Impairments Affecting Rehab Potential  sensation deficits   OT Frequency 2x / week   OT Duration 8 weeks   OT Treatment/Interventions Cryotherapy;Parrafin;Therapeutic exercise;DME and/or AE instruction;Functional Mobility  Training;Therapeutic activities;Patient/family education;Manual Therapy;Neuromuscular education;Fluidtherapy;Ultrasound;Electrical Stimulation;Moist Heat;Contrast Bath;Energy conservation;Passive range of motion;Therapeutic exercises   Plan continue with coordination/sensory re-training, strengthening   OT Home Exercise Plan Education provided:  Coordination HEP, Green putty HEP, light shoulder strengthening HEP   Consulted and Agree with Plan of Care Patient;Family member/caregiver   Family Member Consulted wife      Patient will benefit from skilled therapeutic intervention in order to improve the following deficits and impairments:  Decreased activity tolerance, Decreased coordination, Decreased knowledge of use of DME, Decreased safety awareness, Decreased strength, Impaired UE functional use, Impaired sensation  Visit Diagnosis: Other lack of coordination  Other disturbances of skin sensation  Hemiplegia and hemiparesis following cerebral infarction affecting left dominant side (HCC)  Muscle weakness (generalized)    Problem List Patient Active Problem List   Diagnosis Date Noted  . Carotid artery stenosis, symptomatic, right 03/19/2016  . Hemiparesis affecting dominant side as late effect of stroke ([place]) 03/08/2016  . Hyperlipidemia 03/08/2016  . Occlusion and stenosis of carotid artery 02/29/2016  . History of CVA (cerebrovascular accident) 02/28/2016  . Essential hypertension 12/20/2014  . OA (osteoarthritis) of knee 12/14/2013    [facility] 04/09/2016, 8:47 AM  [place] [address] [place], [place], [address] Phone: [phone]   Fax:  [phone]  Name: Alameen Misencik. MRN: [id number] Date of Birth: 08-Jan-1946    [name], OTR/L [facility] [address] [place], [place]  [address] [phone] phone [phone] 04/09/16 8:47 AM

## 2016-04-12 ENCOUNTER — Ambulatory Visit: Payer: Medicare Other | Admitting: Occupational Therapy

## 2016-04-12 DIAGNOSIS — M6281 Muscle weakness (generalized): Secondary | ICD-10-CM | POA: Diagnosis not present

## 2016-04-12 DIAGNOSIS — R278 Other lack of coordination: Secondary | ICD-10-CM

## 2016-04-12 DIAGNOSIS — I69352 Hemiplegia and hemiparesis following cerebral infarction affecting left dominant side: Secondary | ICD-10-CM | POA: Diagnosis not present

## 2016-04-12 DIAGNOSIS — R208 Other disturbances of skin sensation: Secondary | ICD-10-CM

## 2016-04-12 NOTE — Therapy (Signed)
[facility] [address] [place], [place], [address] Phone: [phone]   Fax:  [phone]  Occupational Therapy Treatment  Patient Details  Name: Ricky [name]. MRN: [id number] Date of Birth: 01-09-1946 Referring Provider: Dr. Garret Reddish (PCP) (Dr. Irine Seal (hospitalist))  Encounter Date: 04/12/2016      OT End of Session - 04/12/16 0916    Visit Number 8   Number of Visits 17   Date for OT Re-Evaluation 05/03/16   Authorization Type Medicare, G-code needed   Authorization - Visit Number 8   Authorization - Number of Visits 10   OT Start Time [phone]   OT Stop Time 0930   OT Time Calculation (min) 43 min   Activity Tolerance Patient tolerated treatment well   Behavior During Therapy [facility] for tasks assessed/performed      Past Medical History:  Diagnosis Date  . Arthritis   . Carotid artery occlusion   . Complication of anesthesia    slow to wake up after shoulder surgeries  . Hypertension   . Peripheral vascular disease ([place])   . Pneumonia   . Stroke [facility])    still has decreased sensation in left hand and arm (as of 03/09/16)    Past Surgical History:  Procedure Laterality Date  . CAROTID ENDARTERECTOMY    . ENDARTERECTOMY Right 03/19/2016   Procedure: ENDARTERECTOMY CAROTID;  Surgeon: Elam Dutch, MD;  Location: [place];  Service: Vascular;  Laterality: Right;  . PATCH ANGIOPLASTY Right 03/19/2016   Procedure: PATCH ANGIOPLASTY OF RIGHT CAROTID USING HEMASHIELD PLATINUM FINESSE;  Surgeon: Elam Dutch, MD;  Location: [place];  Service: Vascular;  Laterality: Right;  . SHOULDER SURGERY Bilateral    left- injections intermittent now  Rotator cuff repair on both shoulders, 2 different surgeries  . TONSILLECTOMY    . TOTAL KNEE ARTHROPLASTY Left 12/14/2013   Procedure: LEFT TOTAL KNEE ARTHROPLASTY;  Surgeon: Gearlean Alf, MD;  [facility];  Service: Orthopedics;  Laterality: Left;     There were no vitals filed for this visit.      Subjective Assessment - 04/12/16 0923    Subjective  Pt reports that his family will be in town next week (no therapy next week)   Pertinent History HTN, L knee replacement, hx of bilateral RTC surgeries (limitations with overhead lifting)   Currently in Pain? No/denies                      OT Treatments/Exercises (OP) - 04/12/16 0001      Fine Motor Coordination   Fine Motor Coordination O'Connor pegs   O'Connor pegs placing/removing pegs with tweezers with min difficulty for incr coordination/sensory training   Grooved pegs placing pegs in with min difficulty and min cueing initially for compensation from shoulder      Sensation Exercises   Sensory Retraining Removing small pegs from green putty for incr strength/sensory re-training.  Pt demo improved ability to find/feel pegs in putty today.                OT Education - 04/12/16 [phone]    Education Details Instructed pt in appropriate community fitness activities (low range weight machines); Updated LUE and core/scapular stability HEP--see pt instructions   Person(s) Educated Patient   Methods Explanation;Demonstration;Verbal cues;Handout   Comprehension Verbalized understanding;Returned demonstration          OT Short Term Goals - 04/06/16 1125      OT  SHORT TERM GOAL #1   Title Pt will be independent with initial HEP for coordination, LUE strength.--check STGs 04/04/16   Time 4   Period Weeks   Status Achieved  04/02/16     OT SHORT TERM GOAL #2   Title Pt will improve L hand coordination for ADLs as shown by improving time on 9-hole peg test by at least 15sec.   Baseline 78.28sec   Time 4   Period Weeks   Status Achieved  04/02/16  24.48sec     OT SHORT TERM GOAL #3   Title Pt will improve L coordination/functional reaching for ADLs as shown by improving score on box and blocks test by at least 10.   Baseline R-62, L-40 blocks   Time 4    Period Weeks   Status Achieved  04/02/16: L-55 blocks     OT SHORT TERM GOAL #4   Title Pt will be able to write 3 sentences with at least 95% legibility with incr time prn.   Baseline approx 85%   Time 4   Period Weeks   Status Achieved  04/02/16     OT SHORT TERM GOAL #5   Title Pt will improve ability to complete dressing fasteners as shown by fastening/unfastening 3 buttons in 55sec or less.   Baseline 68.56sec   Time 4   Period Weeks   Status On-going  68.71 secs     OT SHORT TERM GOAL #6   Title Pt will be able to carry cup of water at least 200 feet with cognitive task without spills.   Time 4   Period Weeks   Status Achieved           OT Long Term Goals - 04/12/16 0920      OT LONG TERM GOAL #1   Title Pt will verbalize understanding of strategies/AE to incr ease/safety with ADLs/IADLs prn.--check LTGs 05/03/16   Time 8   Period Weeks   Status Achieved  04/12/16     OT LONG TERM GOAL #2   Title Pt will improve L hand coordination for ADLs as shown by improving time on 9-hole peg test by at least 25sec.   Baseline 78.28sec   Time 8   Period Weeks   Status Achieved  04/02/16     OT LONG TERM GOAL #3   Title Pt will report no significant difficulty completing grooming tasks with dominant LUE.   Baseline 8   Period Weeks   Status New     OT LONG TERM GOAL #4   Title Pt will be able to write 5 sentences with at least 100% legibility with incr time prn.   Baseline approx 85%   Time 8   Period Weeks   Status New     OT LONG TERM GOAL #5   Title Pt will improve ability to complete dressing fasteners as shown by fastening/unfastening 3 buttons in 45sec or less.   Baseline 68.56sec   Period Weeks   Status New     OT LONG TERM GOAL #6   Title Pt report ability to participate in previous cooking/cleaning tasks mod I.   Time 8   Period Weeks   Status Achieved  04/12/16               Plan - 04/12/16 0916    Clinical Impression Statement Pt  continues to progress with coordination/strength and appears to demo improved sensation/compensation for sensory deficits.   Rehab Potential Good  Clinical Impairments Affecting Rehab Potential sensation deficits   OT Frequency 2x / week   OT Duration 8 weeks   OT Treatment/Interventions Cryotherapy;Parrafin;Therapeutic exercise;DME and/or AE instruction;Therapist, nutritional;Therapeutic activities;Patient/family education;Manual Therapy;Neuromuscular education;Fluidtherapy;Ultrasound;Electrical Stimulation;Moist Heat;Contrast Bath;Energy conservation;Passive range of motion;Therapeutic exercises   Plan writing/buttoning; check goals, possible d/c in 2 visits (pt not scheduled next week due to family in town)   [place] Education provided:  Coordination HEP, Green putty HEP, light shoulder strengthening HEP   Consulted and Agree with Plan of Care Patient;Family member/caregiver   Family Member Consulted wife      Patient will benefit from skilled therapeutic intervention in order to improve the following deficits and impairments:  Decreased activity tolerance, Decreased coordination, Decreased knowledge of use of DME, Decreased safety awareness, Decreased strength, Impaired UE functional use, Impaired sensation  Visit Diagnosis: Other lack of coordination  Other disturbances of skin sensation  Hemiplegia and hemiparesis following cerebral infarction affecting left dominant side (HCC)  Muscle weakness (generalized)    Problem List Patient Active Problem List   Diagnosis Date Noted  . Carotid artery stenosis, symptomatic, right 03/19/2016  . Hemiparesis affecting dominant side as late effect of stroke ([place]) 03/08/2016  . Hyperlipidemia 03/08/2016  . Occlusion and stenosis of carotid artery 02/29/2016  . History of CVA (cerebrovascular accident) 02/28/2016  . Essential hypertension 12/20/2014  . OA (osteoarthritis) of knee 12/14/2013     [facility] 04/12/2016, 9:31 AM  [facility] [address] [place], [place], [address] Phone: [phone]   Fax:  [phone]  Name: Ricky [name]. MRN: [id number] Date of Birth: Mar 26, 1945   [name], OTR/L [facility] [address] [place], [place]  [address] [phone] phone [phone] 04/12/16 9:31 AM

## 2016-04-12 NOTE — Patient Instructions (Addendum)
Strengthening: Resisted Flexion   Attach tube to door.  Hold tubing with one arm at side. Pull forward and up. Move shoulder through pain-free range of motion. Repeat 20 times per set.  Do 1 sessions per day.    Strengthening: Resisted Extension   Attach one end to door.  Hold tubing in one hand, arm forward. Pull arm back, elbow straight. Repeat 20 times per set. Do 1 sessions per day.   Resisted Horizontal Abduction: Bilateral   Sit or stand, tubing in both hands, arms out in front. Keeping arms straight, pinch shoulder blades together and stretch arms out. Repeat 20 times per set.  Do 1 sessions per day.        Prone Plank (Eccentric)    On toes and elbows, pull abdomen in while stabilizing trunk. Slowly lower downward without arching back.  Keep body straight 10 x/day, Hold 10 sec  http://ecce.exer.us/242   Copyright  VHI. All rights reserved.  Arm / Leg Extension: Alternate (All-Fours)    Raise right arm and opposite leg. Do not arch neck. Repeat 5-10 times per day.  Hold 10sec  http://orth.exer.us/110

## 2016-04-13 NOTE — Addendum Note (Signed)
Addended by: Lianne Cure A on: 04/13/2016 09:13 AM   Modules accepted: Orders

## 2016-04-16 ENCOUNTER — Encounter: Payer: Medicare Other | Admitting: Occupational Therapy

## 2016-04-19 ENCOUNTER — Encounter: Payer: Medicare Other | Admitting: Occupational Therapy

## 2016-04-23 ENCOUNTER — Ambulatory Visit: Payer: Medicare Other | Admitting: Occupational Therapy

## 2016-04-23 DIAGNOSIS — I69352 Hemiplegia and hemiparesis following cerebral infarction affecting left dominant side: Secondary | ICD-10-CM

## 2016-04-23 DIAGNOSIS — R278 Other lack of coordination: Secondary | ICD-10-CM

## 2016-04-23 DIAGNOSIS — M6281 Muscle weakness (generalized): Secondary | ICD-10-CM

## 2016-04-23 DIAGNOSIS — R208 Other disturbances of skin sensation: Secondary | ICD-10-CM | POA: Diagnosis not present

## 2016-04-23 NOTE — Therapy (Signed)
[facility] [address] [place], [place], [address] Phone: [phone]   Fax:  [phone]  Occupational Therapy Treatment  Patient Details  Name: Ricky [name]. MRN: [id number] Date of Birth: Jan 01, 1946 Referring Provider: Dr. Garret Reddish (PCP) (Dr. Irine Seal (hospitalist))  Encounter Date: 04/23/2016      OT End of Session - 04/23/16 0819    Visit Number 9   Number of Visits 17   Date for OT Re-Evaluation 05/03/16   Authorization Type Medicare, G-code needed   Authorization - Visit Number 9   Authorization - Number of Visits 10   OT Start Time 0807   OT Stop Time 0846   OT Time Calculation (min) 39 min   Activity Tolerance Patient tolerated treatment well   Behavior During Therapy [facility] for tasks assessed/performed      Past Medical History:  Diagnosis Date  . Arthritis   . Carotid artery occlusion   . Complication of anesthesia    slow to wake up after shoulder surgeries  . Hypertension   . Peripheral vascular disease ([place])   . Pneumonia   . Stroke [facility])    still has decreased sensation in left hand and arm (as of 03/09/16)    Past Surgical History:  Procedure Laterality Date  . CAROTID ENDARTERECTOMY    . ENDARTERECTOMY Right 03/19/2016   Procedure: ENDARTERECTOMY CAROTID;  Surgeon: Elam Dutch, MD;  Location: [facility];  Service: Vascular;  Laterality: Right;  . PATCH ANGIOPLASTY Right 03/19/2016   Procedure: PATCH ANGIOPLASTY OF RIGHT CAROTID USING HEMASHIELD PLATINUM FINESSE;  Surgeon: Elam Dutch, MD;  Location: [place] [facility];  Service: Vascular;  Laterality: Right;  . SHOULDER SURGERY Bilateral    left- injections intermittent now  Rotator cuff repair on both shoulders, 2 different surgeries  . TONSILLECTOMY    . TOTAL KNEE ARTHROPLASTY Left 12/14/2013   Procedure: LEFT TOTAL KNEE ARTHROPLASTY;  Surgeon: Gearlean Alf, MD;  [facility];  Service: Orthopedics;  Laterality: Left;     There were no vitals filed for this visit.      Subjective Assessment - 04/23/16 0848    Subjective  Pt reports improvement in functional use of LUE.   Pertinent History HTN, L knee replacement, hx of bilateral RTC surgeries (limitations with overhead lifting)   Currently in Pain? No/denies       Placing grooved pegs in pegboard with min difficulty and cues to avoid compensation.   Pt reports difficulty typing.  Recommended using velco on home keys to help with placement and made accessibility features to avoid double letters and using tone for caps lock.  Demonstrated how to access these features.  Pt verbalized understanding.  Pt continues to report mild difficulty shaving and keeping razor from turning in hand.  Pt instructed in use of shelf liner/coban wrapped around objects to decr slipping in hand.  Pt verbalized understanding.  Practiced fastening/unfastening buttons on table with improved performance today.--goal met, see below.  Practiced writing.  Pt able to write 5 sentences with 100% legibility.--goal met, see below.  Discussed progress with goals and that things that pt should still work on/be aware of including sensory deficits.  Recommended using 2 long oven mitts vs. Hot pad to remove items from oven due to sensory deficits.  Pt verbalized understanding.  Verbally reviewed strengthening HEP for L shoulder and considerations for gym activities.  Also reinforced avoiding compensation for coordination tasks and normal shoulder positioning.  Pt verbalized understanding.  OT Short Term Goals - 04/23/16 5732      OT SHORT TERM GOAL #1   Title Pt will be independent with initial HEP for coordination, LUE strength.--check STGs 04/04/16   Time 4   Period Weeks   Status Achieved  04/02/16     OT SHORT TERM GOAL #2   Title Pt will improve L hand coordination for ADLs as shown by improving time on 9-hole peg test by at least  15sec.   Baseline 78.28sec   Time 4   Period Weeks   Status Achieved  04/02/16  24.48sec     OT SHORT TERM GOAL #3   Title Pt will improve L coordination/functional reaching for ADLs as shown by improving score on box and blocks test by at least 10.   Baseline R-62, L-40 blocks   Time 4   Period Weeks   Status Achieved  04/02/16: L-55 blocks     OT SHORT TERM GOAL #4   Title Pt will be able to write 3 sentences with at least 95% legibility with incr time prn.   Baseline approx 85%   Time 4   Period Weeks   Status Achieved  04/02/16     OT SHORT TERM GOAL #5   Title Pt will improve ability to complete dressing fasteners as shown by fastening/unfastening 3 buttons in 55sec or less.   Baseline 68.56sec   Time 4   Period Weeks   Status Achieved  68.71 secs;  04/23/16  32.50sec     OT SHORT TERM GOAL #6   Title Pt will be able to carry cup of water at least 200 feet with cognitive task without spills.   Time 4   Period Weeks   Status Achieved           OT Long Term Goals - 04/23/16 2025      OT LONG TERM GOAL #1   Title Pt will verbalize understanding of strategies/AE to incr ease/safety with ADLs/IADLs prn.--check LTGs 05/03/16   Time 8   Period Weeks   Status Achieved  04/12/16     OT LONG TERM GOAL #2   Title Pt will improve L hand coordination for ADLs as shown by improving time on 9-hole peg test by at least 25sec.   Baseline 78.28sec   Time 8   Period Weeks   Status Achieved  04/02/16     OT LONG TERM GOAL #3   Title Pt will report no significant difficulty completing grooming tasks with dominant LUE.   Baseline 8   Period Weeks   Status New     OT LONG TERM GOAL #4   Title Pt will be able to write 5 sentences with at least 100% legibility with incr time prn.   Baseline approx 85%   Time 8   Period Weeks   Status Achieved  04/23/16     OT LONG TERM GOAL #5   Title Pt will improve ability to complete dressing fasteners as shown by fastening/unfastening  3 buttons in 45sec or less.   Baseline 68.56sec   Period Weeks   Status Achieved  04/23/16:  32.50sec     OT LONG TERM GOAL #6   Title Pt report ability to participate in previous cooking/cleaning tasks mod I.   Time 8   Period Weeks   Status Achieved  04/12/16               Plan - 04/23/16 0843    Clinical Impression  Statement Pt has made excellent progress towards goals with improved buttoning and writing today.   Rehab Potential Good   Clinical Impairments Affecting Rehab Potential sensation deficits   OT Frequency 2x / week   OT Duration 8 weeks   OT Treatment/Interventions Cryotherapy;Parrafin;Therapeutic exercise;DME and/or AE instruction;Therapist, nutritional;Therapeutic activities;Patient/family education;Manual Therapy;Neuromuscular education;Fluidtherapy;Ultrasound;Electrical Stimulation;Moist Heat;Contrast Bath;Energy conservation;Passive range of motion;Therapeutic exercises   Plan check remaining goals, anticipate d/c, review strategies for ADLs   OT Home Exercise Plan Education provided:  Coordination HEP, Green putty HEP, light shoulder strengthening HEP   Consulted and Agree with Plan of Care Patient   Family Member Consulted --      Patient will benefit from skilled therapeutic intervention in order to improve the following deficits and impairments:  Decreased activity tolerance, Decreased coordination, Decreased knowledge of use of DME, Decreased safety awareness, Decreased strength, Impaired UE functional use, Impaired sensation  Visit Diagnosis: Other lack of coordination  Other disturbances of skin sensation  Hemiplegia and hemiparesis following cerebral infarction affecting left dominant side (HCC)  Muscle weakness (generalized)    Problem List Patient Active Problem List   Diagnosis Date Noted  . Carotid artery stenosis, symptomatic, right 03/19/2016  . Hemiparesis affecting dominant side as late effect of stroke ([place]) 03/08/2016  .  Hyperlipidemia 03/08/2016  . Occlusion and stenosis of carotid artery 02/29/2016  . History of CVA (cerebrovascular accident) 02/28/2016  . Essential hypertension 12/20/2014  . OA (osteoarthritis) of knee 12/14/2013    [facility] 04/23/2016, 1:04 PM  [facility] [address] [place], [place], [address] Phone: [phone]   Fax:  [phone]  Name: Ricky [name]. MRN: [id number] Date of Birth: 23-Apr-1945   [name], OTR/L [facility] [address] [place], [place]  [address] [phone] phone [phone] 04/23/16 1:04 PM

## 2016-04-26 ENCOUNTER — Ambulatory Visit: Payer: Medicare Other | Admitting: Occupational Therapy

## 2016-04-26 DIAGNOSIS — R278 Other lack of coordination: Secondary | ICD-10-CM | POA: Diagnosis not present

## 2016-04-26 DIAGNOSIS — R208 Other disturbances of skin sensation: Secondary | ICD-10-CM

## 2016-04-26 DIAGNOSIS — I69352 Hemiplegia and hemiparesis following cerebral infarction affecting left dominant side: Secondary | ICD-10-CM | POA: Diagnosis not present

## 2016-04-26 DIAGNOSIS — M6281 Muscle weakness (generalized): Secondary | ICD-10-CM

## 2016-04-26 NOTE — Therapy (Signed)
[facility] [address] [place], [place], [address] Phone: [phone]   Fax:  [phone]  Occupational Therapy Treatment  Patient Details  Name: Ricky [name]. MRN: [id number] Date of Birth: 1945/06/01 Referring Provider: Dr. Garret Reddish (PCP) (Dr. Irine Seal (hospitalist))  Encounter Date: 04/26/2016      OT End of Session - 04/26/16 0813    Visit Number 10   Number of Visits 17   Date for OT Re-Evaluation 05/03/16   Authorization Type Medicare, G-code needed   Authorization - Visit Number 10   Authorization - Number of Visits 10   OT Start Time 0808  d/c day +FOTO   OT Stop Time 0846   OT Time Calculation (min) 38 min   Activity Tolerance Patient tolerated treatment well   Behavior During Therapy [facility] for tasks assessed/performed      Past Medical History:  Diagnosis Date  . Arthritis   . Carotid artery occlusion   . Complication of anesthesia    slow to wake up after shoulder surgeries  . Hypertension   . Peripheral vascular disease ([place])   . Pneumonia   . Stroke [facility])    still has decreased sensation in left hand and arm (as of 03/09/16)    Past Surgical History:  Procedure Laterality Date  . CAROTID ENDARTERECTOMY    . ENDARTERECTOMY Right 03/19/2016   Procedure: ENDARTERECTOMY CAROTID;  Surgeon: Elam Dutch, MD;  Location: [facility];  Service: Vascular;  Laterality: Right;  . PATCH ANGIOPLASTY Right 03/19/2016   Procedure: PATCH ANGIOPLASTY OF RIGHT CAROTID USING HEMASHIELD PLATINUM FINESSE;  Surgeon: Elam Dutch, MD;  Location: [facility];  Service: Vascular;  Laterality: Right;  . SHOULDER SURGERY Bilateral    left- injections intermittent now  Rotator cuff repair on both shoulders, 2 different surgeries  . TONSILLECTOMY    . TOTAL KNEE ARTHROPLASTY Left 12/14/2013   Procedure: LEFT TOTAL KNEE ARTHROPLASTY;  Surgeon: Gearlean Alf, MD;  [facility];  Service: Orthopedics;   Laterality: Left;    There were no vitals filed for this visit.      Subjective Assessment - 04/26/16 0812    Subjective  Pt reports continued difficulty with folding money, scooping, writing, donning glasses, holding dog leash, doing weight machines, clothing orientation due to decr sensation   Pertinent History HTN, L knee replacement, hx of bilateral RTC surgeries (limitations with overhead lifting)   Currently in Pain? No/denies      Checked remaining goals and discussed progress and expected continued progress/prognosis.  Discussed importance of forcing continued LUE functional use for safe tasks.  Pt verbalized understanding.  Placing pegs in grooved pegboard with min difficulty, no compensation noted.  Functional reaching overhead to place small pegs in vertical pegboard for incr activity tolerance with min drops and fatigue after 2 rows.   Pt completed FOTO.                      OT Education - 04/26/16 [id number]    Education Details Reviewed updated LUE and core/scapular strengthening HEP (upgraded theraband to green)   Person(s) Educated Patient   Methods Explanation;Demonstration   Comprehension Verbalized understanding;Returned demonstration          OT Short Term Goals - 04/23/16 0821      OT SHORT TERM GOAL #1   Title Pt will be independent with initial HEP for coordination, LUE strength.--check STGs 04/04/16   Time 4   Period  Weeks   Status Achieved  04/02/16     OT SHORT TERM GOAL #2   Title Pt will improve L hand coordination for ADLs as shown by improving time on 9-hole peg test by at least 15sec.   Baseline 78.28sec   Time 4   Period Weeks   Status Achieved  04/02/16  24.48sec     OT SHORT TERM GOAL #3   Title Pt will improve L coordination/functional reaching for ADLs as shown by improving score on box and blocks test by at least 10.   Baseline R-62, L-40 blocks   Time 4   Period Weeks   Status Achieved  04/02/16: L-55 blocks     OT  SHORT TERM GOAL #4   Title Pt will be able to write 3 sentences with at least 95% legibility with incr time prn.   Baseline approx 85%   Time 4   Period Weeks   Status Achieved  04/02/16     OT SHORT TERM GOAL #5   Title Pt will improve ability to complete dressing fasteners as shown by fastening/unfastening 3 buttons in 55sec or less.   Baseline 68.56sec   Time 4   Period Weeks   Status Achieved  68.71 secs;  04/23/16  32.50sec     OT SHORT TERM GOAL #6   Title Pt will be able to carry cup of water at least 200 feet with cognitive task without spills.   Time 4   Period Weeks   Status Achieved           OT Long Term Goals - 04/26/16 9417      OT LONG TERM GOAL #1   Title Pt will verbalize understanding of strategies/AE to incr ease/safety with ADLs/IADLs prn.--check LTGs 05/03/16   Time 8   Period Weeks   Status Achieved  04/12/16     OT LONG TERM GOAL #2   Title Pt will improve L hand coordination for ADLs as shown by improving time on 9-hole peg test by at least 25sec.   Baseline 78.28sec   Time 8   Period Weeks   Status Achieved  04/02/16     OT LONG TERM GOAL #3   Title Pt will report no significant difficulty completing grooming tasks with dominant LUE.   Baseline 8   Period Weeks   Status Partially Met  04/26/16, minimal difficulty with shaving     OT LONG TERM GOAL #4   Title Pt will be able to write 5 sentences with at least 100% legibility with incr time prn.   Baseline approx 85%   Time 8   Period Weeks   Status Achieved  04/23/16     OT LONG TERM GOAL #5   Title Pt will improve ability to complete dressing fasteners as shown by fastening/unfastening 3 buttons in 45sec or less.   Baseline 68.56sec   Period Weeks   Status Achieved  04/23/16:  32.50sec     OT LONG TERM GOAL #6   Title Pt report ability to participate in previous cooking/cleaning tasks mod I.   Time 8   Period Weeks   Status Achieved  04/12/16               Plan - 04/26/16  0813    Clinical Impression Statement Pt has made excellent progress and is appropriate for d/c today.   Rehab Potential Good   Clinical Impairments Affecting Rehab Potential sensation deficits   OT Frequency 2x / week  OT Duration 8 weeks   OT Treatment/Interventions Cryotherapy;Parrafin;Therapeutic exercise;DME and/or AE instruction;Therapist, nutritional;Therapeutic activities;Patient/family education;Manual Therapy;Neuromuscular education;Fluidtherapy;Ultrasound;Electrical Stimulation;Moist Heat;Contrast Bath;Energy conservation;Passive range of motion;Therapeutic exercises   Plan d/c OT   OT Home Exercise Plan Education provided:  Coordination HEP, Green putty HEP, light shoulder strengthening HEP   Consulted and Agree with Plan of Care Patient      Patient will benefit from skilled therapeutic intervention in order to improve the following deficits and impairments:  Decreased activity tolerance, Decreased coordination, Decreased knowledge of use of DME, Decreased safety awareness, Decreased strength, Impaired UE functional use, Impaired sensation  Visit Diagnosis: Other lack of coordination  Other disturbances of skin sensation  Hemiplegia and hemiparesis following cerebral infarction affecting left dominant side (HCC)  Muscle weakness (generalized)      G-Codes - April 29, 2016 1305    Functional Assessment Tool Used (Outpatient only) 9-hole peg test:  L-24.48sec.  Box and blocks test: L-55 blocks   Functional Limitation Carrying, moving and handling objects   Carrying, Moving and Handling Objects Goal Status [phone]) At least 1 percent but less than 20 percent impaired, limited or restricted   Carrying, Moving and Handling Objects Discharge Status [phone]) At least 1 percent but less than 20 percent impaired, limited or restricted      Problem List Patient Active Problem List   Diagnosis Date Noted  . Carotid artery stenosis, symptomatic, right 03/19/2016  . Hemiparesis  affecting dominant side as late effect of stroke ([place]) 03/08/2016  . Hyperlipidemia 03/08/2016  . Occlusion and stenosis of carotid artery 02/29/2016  . History of CVA (cerebrovascular accident) 02/28/2016  . Essential hypertension 12/20/2014  . OA (osteoarthritis) of knee 12/14/2013    OCCUPATIONAL THERAPY DISCHARGE SUMMARY  Visits from Start of Care: 10  Current functional level related to goals / functional outcomes: See above   Remaining deficits: decr sensation, mild decr strength and decr coordination   Education / Equipment: Pt instructed in precautions/safety considerations for sensory deficits, HEP for coordination/strength.  Pt verbalized understanding of all education provided.  Plan: Patient agrees to discharge.  Patient goals were met. Patient is being discharged due to meeting the stated rehab goals.  ?????       [facility] 04-29-2016, 1:06 PM  [facility] [address] [place], [place], [address] Phone: [phone]   Fax:  [phone]  Name: Bernal Luhman. MRN: [id number] Date of Birth: 06-22-1945   [name], OTR/L [facility] [address] [place], [place]  [address] [phone] phone [phone] April 29, 2016 1:06 PM

## 2016-06-05 ENCOUNTER — Other Ambulatory Visit: Payer: Self-pay

## 2016-06-05 ENCOUNTER — Ambulatory Visit (INDEPENDENT_AMBULATORY_CARE_PROVIDER_SITE_OTHER): Payer: Medicare Other | Admitting: Family Medicine

## 2016-06-05 ENCOUNTER — Encounter: Payer: Self-pay | Admitting: Family Medicine

## 2016-06-05 DIAGNOSIS — I10 Essential (primary) hypertension: Secondary | ICD-10-CM

## 2016-06-05 DIAGNOSIS — I639 Cerebral infarction, unspecified: Secondary | ICD-10-CM | POA: Diagnosis not present

## 2016-06-05 DIAGNOSIS — E7800 Pure hypercholesterolemia, unspecified: Secondary | ICD-10-CM

## 2016-06-05 DIAGNOSIS — I69359 Hemiplegia and hemiparesis following cerebral infarction affecting unspecified side: Secondary | ICD-10-CM | POA: Diagnosis not present

## 2016-06-05 DIAGNOSIS — I6521 Occlusion and stenosis of right carotid artery: Secondary | ICD-10-CM

## 2016-06-05 LAB — CBC WITH DIFFERENTIAL/PLATELET
BASOS PCT: 0.5 % (ref 0.0–3.0)
Basophils Absolute: 0 10*3/uL (ref 0.0–0.1)
EOS PCT: 3.9 % (ref 0.0–5.0)
Eosinophils Absolute: 0.1 10*3/uL (ref 0.0–0.7)
HEMATOCRIT: 39.7 % (ref 39.0–52.0)
HEMOGLOBIN: 13.1 g/dL (ref 13.0–17.0)
Lymphocytes Relative: 25.2 % (ref 12.0–46.0)
Lymphs Abs: 0.8 10*3/uL (ref 0.7–4.0)
MCHC: 33.1 g/dL (ref 30.0–36.0)
MCV: 94.8 fl (ref 78.0–100.0)
MONO ABS: 0.4 10*3/uL (ref 0.1–1.0)
MONOS PCT: 11.6 % (ref 3.0–12.0)
Neutro Abs: 1.9 10*3/uL (ref 1.4–7.7)
Neutrophils Relative %: 58.8 % (ref 43.0–77.0)
Platelets: 219 10*3/uL (ref 150.0–400.0)
RBC: 4.19 Mil/uL — AB (ref 4.22–5.81)
RDW: 15.5 % (ref 11.5–15.5)
WBC: 3.2 10*3/uL — AB (ref 4.0–10.5)

## 2016-06-05 LAB — LDL CHOLESTEROL, DIRECT: LDL DIRECT: 33 mg/dL

## 2016-06-05 LAB — BASIC METABOLIC PANEL
BUN: 23 mg/dL (ref 6–23)
CO2: 29 meq/L (ref 19–32)
Calcium: 9.3 mg/dL (ref 8.4–10.5)
Chloride: 106 mEq/L (ref 96–112)
Creatinine, Ser: 0.86 mg/dL (ref 0.40–1.50)
GFR: 92.77 mL/min (ref >60.00)
GLUCOSE: 98 mg/dL (ref 70–99)
Potassium: 4.4 mEq/L (ref 3.5–5.1)
SODIUM: 140 meq/L (ref 135–145)

## 2016-06-05 MED ORDER — ATORVASTATIN CALCIUM 20 MG PO TABS: 20.0000 mg | ORAL_TABLET | Freq: Every day | ORAL | 3 refills | Status: DC

## 2016-06-05 NOTE — Assessment & Plan Note (Signed)
S: controlled at home on  amlodipine 2.5mg . Home #s average over last 10 days 131/82. This includes highest #s of the day- which go lower on repeat. Home long shows decrease in numbers from high 130s to low 130s and higher 80s to lower 80s BP Readings from Last 3 Encounters:  06/05/16 (!) 142/84  04/05/16 (!) 140/58  03/20/16 124/65  A/P:Continue current meds:  Advised bring home cuff to next visit. He will drop off a log of blood pressures about halfway between now and 6 month visit

## 2016-06-05 NOTE — Assessment & Plan Note (Signed)
S: L side numbness. Left handed. Coordination improved. Rarely drops objects now. A/P: continue to minimize risk factors for stroke. He did well with carotid endarectomy and has follow up in next few months

## 2016-06-05 NOTE — Assessment & Plan Note (Signed)
S: suspect improved controlled on atorvastatin 20mg . No myalgias.  Lab Results  Component Value Date   CHOL 154 01/10/2016   HDL 70.80 01/10/2016   LDLCALC 75 01/10/2016   TRIG 44.0 01/10/2016   CHOLHDL 2 01/10/2016   A/P: update ldl today- suspect at goal <70

## 2016-06-05 NOTE — Patient Instructions (Addendum)
No changes today  Goal blood pressure <135/85  See me 01/09/17 or later for physical. Drop off blood pressure log between now and next visit perhaps halfway through. Bring cuff to next visit.

## 2016-06-05 NOTE — Progress Notes (Signed)
Pre visit review using our clinic review tool, if applicable. No additional management support is needed unless otherwise documented below in the visit note. 

## 2016-06-05 NOTE — Progress Notes (Signed)
Subjective:  Ricky [name]. is a 71 y.o. year old very pleasant male patient who presents for/with See problem oriented charting ROS- some left hand numbness. No clear weakness but can drop objects from not feeling them well. No chest pain or shortness of breath.    Past Medical History-  Patient Active Problem List   Diagnosis Date Noted  . Hemiparesis affecting dominant side as late effect of stroke ([place]) 03/08/2016    Priority: High  . History of CVA (cerebrovascular accident) 02/28/2016    Priority: High  . Carotid artery stenosis, symptomatic, right 03/19/2016    Priority: Medium  . Hyperlipidemia 03/08/2016    Priority: Medium  . Essential hypertension 12/20/2014    Priority: Medium  . OA (osteoarthritis) of knee 12/14/2013    Priority: Low    Medications- reviewed and updated Current Outpatient Prescriptions  Medication Sig Dispense Refill  . acetaminophen (TYLENOL) 500 MG tablet Take 500-1,000 mg by mouth every 6 (six) hours as needed (for pain.).     Marland Kitchen amLODipine (NORVASC) 2.5 MG tablet Take 1 tablet (2.5 mg total) by mouth daily. 90 tablet 3  . aspirin EC 325 MG tablet Take 1 tablet (325 mg total) by mouth daily. 30 tablet 0  . Multiple Vitamin (MULTIVITAMIN WITH MINERALS) TABS tablet Take 1 tablet by mouth daily.    Marland Kitchen atorvastatin (LIPITOR) 20 MG tablet Take 1 tablet (20 mg total) by mouth daily at 6 PM. 90 tablet 3   No current facility-administered medications for this visit.     Objective: BP (!) 142/84   Pulse (!) 53   Temp 97.7 F (36.5 C) (Oral)   Ht 5' 5.5" (1.664 m)   Wt 143 lb 3.2 oz (65 kg)   SpO2 99%   BMI 23.47 kg/m  Gen: NAD, resting comfortably CV: RRR (not bradycardic on my exam) no murmurs rubs or gallops Lungs: CTAB no crackles, wheeze, rhonchi Abdomen: soft/nontender/nondistended/normal bowel sounds. thin Ext: no edema Skin: warm, dry Neuro: grossly normal, decreased monofilament and temperature sensation on left arm. Normal gait.    Assessment/Plan:  Hyperlipidemia S: suspect improved controlled on atorvastatin 20mg . No myalgias.  Lab Results  Component Value Date   CHOL 154 01/10/2016   HDL 70.80 01/10/2016   LDLCALC 75 01/10/2016   TRIG 44.0 01/10/2016   CHOLHDL 2 01/10/2016   A/P: update ldl today- suspect at goal <70  Essential hypertension S: controlled at home on  amlodipine 2.5mg . Home #s average over last 10 days 131/82. This includes highest #s of the day- which go lower on repeat. Home long shows decrease in numbers from high 130s to low 130s and higher 80s to lower 80s BP Readings from Last 3 Encounters:  06/05/16 (!) 142/84  04/05/16 (!) 140/58  03/20/16 124/65  A/P:Continue current meds:  Advised bring home cuff to next visit. He will drop off a log of blood pressures about halfway between now and 6 month visit  Carotid artery stenosis, symptomatic, right S: R carotid endarterectomy 03/2016 A/P: doing well, this should reduce CVA risk  Hemiparesis affecting dominant side as late effect of stroke (HCC) S: L side numbness. Left handed. Coordination improved. Rarely drops objects now. A/P: continue to minimize risk factors for stroke. He did well with carotid endarectomy and has follow up in next few months  Return in about 32 weeks (around 01/15/2017) for physical.  Orders Placed This Encounter  Procedures  . LDL cholesterol, direct    [place]  . CBC with  Differential/Platelet  . Basic metabolic panel    [place]   Return precautions advised.  Garret Reddish, MD

## 2016-06-05 NOTE — Assessment & Plan Note (Signed)
S: R carotid endarterectomy 03/2016 A/P: doing well, this should reduce CVA risk

## 2016-06-06 ENCOUNTER — Telehealth: Payer: Self-pay | Admitting: Family Medicine

## 2016-06-06 NOTE — Telephone Encounter (Signed)
° °  Pt said he will call in the morning did not want to give me the message

## 2016-06-07 ENCOUNTER — Other Ambulatory Visit: Payer: Self-pay

## 2016-06-07 DIAGNOSIS — E785 Hyperlipidemia, unspecified: Secondary | ICD-10-CM

## 2016-06-07 DIAGNOSIS — D72819 Decreased white blood cell count, unspecified: Secondary | ICD-10-CM

## 2016-06-07 NOTE — Telephone Encounter (Signed)
Spoke with patient who just needed to schedule a lab appointment

## 2016-07-30 ENCOUNTER — Other Ambulatory Visit (INDEPENDENT_AMBULATORY_CARE_PROVIDER_SITE_OTHER): Payer: Medicare Other

## 2016-07-30 DIAGNOSIS — D72819 Decreased white blood cell count, unspecified: Secondary | ICD-10-CM

## 2016-07-30 DIAGNOSIS — E785 Hyperlipidemia, unspecified: Secondary | ICD-10-CM

## 2016-07-30 LAB — CBC WITH DIFFERENTIAL/PLATELET
BASOS PCT: 0.7 % (ref 0.0–3.0)
Basophils Absolute: 0 10*3/uL (ref 0.0–0.1)
EOS PCT: 7.8 % — AB (ref 0.0–5.0)
Eosinophils Absolute: 0.3 10*3/uL (ref 0.0–0.7)
HEMATOCRIT: 39.1 % (ref 39.0–52.0)
HEMOGLOBIN: 13.4 g/dL (ref 13.0–17.0)
LYMPHS PCT: 28.4 % (ref 12.0–46.0)
Lymphs Abs: 1 10*3/uL (ref 0.7–4.0)
MCHC: 34.1 g/dL (ref 30.0–36.0)
MCV: 93.8 fl (ref 78.0–100.0)
MONOS PCT: 12.6 % — AB (ref 3.0–12.0)
Monocytes Absolute: 0.4 10*3/uL (ref 0.1–1.0)
Neutro Abs: 1.7 10*3/uL (ref 1.4–7.7)
Neutrophils Relative %: 50.5 % (ref 43.0–77.0)
Platelets: 228 10*3/uL (ref 150.0–400.0)
RBC: 4.17 Mil/uL — AB (ref 4.22–5.81)
RDW: 14.2 % (ref 11.5–15.5)
WBC: 3.4 10*3/uL — AB (ref 4.0–10.5)

## 2016-07-30 LAB — LDL CHOLESTEROL, DIRECT: Direct LDL: 42 mg/dL

## 2016-07-31 ENCOUNTER — Encounter: Payer: Self-pay | Admitting: Family Medicine

## 2016-08-01 ENCOUNTER — Other Ambulatory Visit: Payer: Self-pay

## 2016-08-01 DIAGNOSIS — D72819 Decreased white blood cell count, unspecified: Secondary | ICD-10-CM

## 2016-08-14 DIAGNOSIS — H25813 Combined forms of age-related cataract, bilateral: Secondary | ICD-10-CM | POA: Diagnosis not present

## 2016-08-14 DIAGNOSIS — H40003 Preglaucoma, unspecified, bilateral: Secondary | ICD-10-CM | POA: Diagnosis not present

## 2016-09-19 ENCOUNTER — Telehealth: Payer: Self-pay | Admitting: Family Medicine

## 2016-09-19 NOTE — Telephone Encounter (Signed)
Pt brought copy of BP log. He states it is a copy and can be shredded when we are done with it. Placed in physician folder at front office area. cb

## 2016-09-20 NOTE — Telephone Encounter (Signed)
Please thank patient for dropping off BPs- overall look pretty good with vast majority <140/90. He asked if he needs to have his cuff compared to ours- I think its ok to wait to do this at his next visit- no earlier appointment needed

## 2016-09-21 NOTE — Telephone Encounter (Signed)
Spoke with patient's wife and let her know. She will pass the message along to him.

## 2016-10-16 ENCOUNTER — Other Ambulatory Visit (INDEPENDENT_AMBULATORY_CARE_PROVIDER_SITE_OTHER): Payer: Medicare Other

## 2016-10-16 ENCOUNTER — Encounter: Payer: Self-pay | Admitting: Vascular Surgery

## 2016-10-16 DIAGNOSIS — D72819 Decreased white blood cell count, unspecified: Secondary | ICD-10-CM

## 2016-10-16 LAB — CBC WITH DIFFERENTIAL/PLATELET
BASOS ABS: 0 10*3/uL (ref 0.0–0.1)
BASOS PCT: 0.6 % (ref 0.0–3.0)
EOS ABS: 0.2 10*3/uL (ref 0.0–0.7)
EOS PCT: 6.3 % — AB (ref 0.0–5.0)
HCT: 40.1 % (ref 39.0–52.0)
HEMOGLOBIN: 13.4 g/dL (ref 13.0–17.0)
Lymphocytes Relative: 28.5 % (ref 12.0–46.0)
Lymphs Abs: 1.1 10*3/uL (ref 0.7–4.0)
MCHC: 33.5 g/dL (ref 30.0–36.0)
MCV: 95.3 fl (ref 78.0–100.0)
MONO ABS: 0.5 10*3/uL (ref 0.1–1.0)
MONOS PCT: 12.1 % — AB (ref 3.0–12.0)
Neutro Abs: 2 10*3/uL (ref 1.4–7.7)
Neutrophils Relative %: 52.5 % (ref 43.0–77.0)
Platelets: 227 10*3/uL (ref 150.0–400.0)
RBC: 4.21 Mil/uL — ABNORMAL LOW (ref 4.22–5.81)
RDW: 13.4 % (ref 11.5–15.5)
WBC: 3.8 10*3/uL — AB (ref 4.0–10.5)

## 2016-10-18 ENCOUNTER — Encounter: Payer: Self-pay | Admitting: Vascular Surgery

## 2016-10-18 ENCOUNTER — Ambulatory Visit (HOSPITAL_COMMUNITY)
Admission: RE | Admit: 2016-10-18 | Discharge: 2016-10-18 | Disposition: A | Discharge status: Home / Self Care | Payer: Medicare Other | Source: Ambulatory Visit | Attending: Vascular Surgery | Admitting: Vascular Surgery

## 2016-10-18 ENCOUNTER — Ambulatory Visit (INDEPENDENT_AMBULATORY_CARE_PROVIDER_SITE_OTHER): Payer: Medicare Other | Admitting: Vascular Surgery

## 2016-10-18 VITALS — BP 148/83 | HR 46 | Temp 97.1°F | Ht 66.0 in | Wt 146.0 lb

## 2016-10-18 DIAGNOSIS — I6521 Occlusion and stenosis of right carotid artery: Secondary | ICD-10-CM | POA: Diagnosis not present

## 2016-10-18 DIAGNOSIS — I6522 Occlusion and stenosis of left carotid artery: Secondary | ICD-10-CM | POA: Diagnosis not present

## 2016-10-18 DIAGNOSIS — I639 Cerebral infarction, unspecified: Secondary | ICD-10-CM

## 2016-10-18 DIAGNOSIS — I6523 Occlusion and stenosis of bilateral carotid arteries: Secondary | ICD-10-CM | POA: Diagnosis not present

## 2016-10-18 NOTE — Progress Notes (Signed)
Patient is a 71 year old male who returns for follow-up today. He sustained a right brain stroke January 2018. His primary symptom was left hand clumsiness. He underwent a right carotid endarterectomy 03/19/2016. He has had no symptoms of TIA amaurosis or stroke since that operation. He is on a statin and aspirin.  He still has some subtle clumsiness of his left hand for example dropping his keys when he tries to put them in his car.  Systems: He denies shortness of breath. He denies chest pain.  Current Outpatient Prescriptions on File Prior to Visit  Medication Sig Dispense Refill  . acetaminophen (TYLENOL) 500 MG tablet Take 500-1,000 mg by mouth every 6 (six) hours as needed (for pain.).     Marland Kitchen amLODipine (NORVASC) 2.5 MG tablet Take 1 tablet (2.5 mg total) by mouth daily. 90 tablet 3  . aspirin EC 325 MG tablet Take 1 tablet (325 mg total) by mouth daily. 30 tablet 0  . atorvastatin (LIPITOR) 20 MG tablet Take 1 tablet (20 mg total) by mouth daily at 6 PM. (Patient taking differently: Take 20 mg by mouth daily at 6 PM. Every other day in in the morning) 90 tablet 3  . Multiple Vitamin (MULTIVITAMIN WITH MINERALS) TABS tablet Take 1 tablet by mouth daily.     No current facility-administered medications on file prior to visit.     Vitals:   10/18/16 0835  BP: (!) 148/83  Pulse: (!) 46  Temp: (!) 97.1 F (36.2 C)  TempSrc: Oral  SpO2: 98%  Weight: 146 lb (66.2 kg)  Height: 5\' 6"  (1.676 m)    Neck: No carotid bruits well-healed right neck incision  Neuro: Symmetric upper and lower extremity motor strength 5 over 5 no facial asymmetry  Data: Carotid duplex scan performed on today was reviewed and interpreted by myself. No significant right side restenosis less than 40% left internal carotid artery stenosis antegrade vertebral flow  Assessment: Doing well status post right carotid endarterectomy done for symptomatic right carotid stenosis and right brain stroke.  Plan: The patient  will follow-up in 6 months with our nurse practitioner and have a repeat carotid duplex scan at that time.  He will continue his statin and aspirin.  Ruta Hinds, MD [facility] Office: [phone] Pager: [phone]

## 2016-11-01 NOTE — Addendum Note (Signed)
Addended by: Lianne Cure A on: 11/01/2016 03:32 PM   Modules accepted: Orders

## 2016-11-12 ENCOUNTER — Ambulatory Visit (INDEPENDENT_AMBULATORY_CARE_PROVIDER_SITE_OTHER): Payer: Medicare Other | Admitting: Family Medicine

## 2016-11-12 ENCOUNTER — Encounter: Payer: Self-pay | Admitting: Family Medicine

## 2016-11-12 VITALS — BP 132/86 | HR 55 | Temp 98.1°F | Ht 66.0 in | Wt 146.4 lb

## 2016-11-12 DIAGNOSIS — I639 Cerebral infarction, unspecified: Secondary | ICD-10-CM | POA: Diagnosis not present

## 2016-11-12 DIAGNOSIS — Z23 Encounter for immunization: Secondary | ICD-10-CM | POA: Diagnosis not present

## 2016-11-12 DIAGNOSIS — M65332 Trigger finger, left middle finger: Secondary | ICD-10-CM | POA: Diagnosis not present

## 2016-11-12 MED ORDER — MELOXICAM 7.5 MG PO TABS: 7.5000 mg | ORAL_TABLET | Freq: Every day | ORAL | 0 refills | Status: DC

## 2016-11-12 NOTE — Patient Instructions (Signed)
Try splint at night and in the day if able  Use meloxicam for 7-10 days. Take with breakfast or dinner (want to have food on your stomach)  If not improving within 6 weeks-let me refer you to our excellent sports medicine physician Dr. Paulla Fore to consider injection

## 2016-11-12 NOTE — Progress Notes (Signed)
Subjective:  Ricky Francis. is a 71 y.o. year old very pleasant male patient who presents for/with See problem oriented charting ROS- no fever or chills. No redness at joint. Does have pain at left hand just proximal to MCP joint.    Past Medical History-  Patient Active Problem List   Diagnosis Date Noted  . Hemiparesis affecting dominant side as late effect of stroke ([place]) 03/08/2016    Priority: High  . History of CVA (cerebrovascular accident) 02/28/2016    Priority: High  . Carotid artery stenosis, symptomatic, right 03/19/2016    Priority: Medium  . Hyperlipidemia 03/08/2016    Priority: Medium  . Essential hypertension 12/20/2014    Priority: Medium  . OA (osteoarthritis) of knee 12/14/2013    Priority: Low  . Trigger middle finger of left hand 11/12/2016    Medications- reviewed and updated Current Outpatient Prescriptions  Medication Sig Dispense Refill  . acetaminophen (TYLENOL) 500 MG tablet Take 500-1,000 mg by mouth every 6 (six) hours as needed (for pain.).     Marland Kitchen amLODipine (NORVASC) 2.5 MG tablet Take 1 tablet (2.5 mg total) by mouth daily. 90 tablet 3  . aspirin EC 325 MG tablet Take 1 tablet (325 mg total) by mouth daily. 30 tablet 0  . atorvastatin (LIPITOR) 20 MG tablet Take 1 tablet (20 mg total) by mouth daily at 6 PM. (Patient taking differently: Take 20 mg by mouth daily at 6 PM. Every other day in in the morning) 90 tablet 3  . Multiple Vitamin (MULTIVITAMIN WITH MINERALS) TABS tablet Take 1 tablet by mouth daily.     No current facility-administered medications for this visit.     Objective: BP 132/86 (BP Location: Left Arm, Patient Position: Sitting, Cuff Size: Large)   Pulse (!) 55   Temp 98.1 F (36.7 C) (Oral)   Ht 5\' 6"  (1.676 m)   Wt 146 lb 6.4 oz (66.4 kg)   SpO2 97%   BMI 23.63 kg/m  Gen: NAD, resting comfortably Left hand- mild pain with palpation near a1 pulley. No obvious catching today.   Assessment/Plan:  Trigger middle finger  of left hand S: Patient is left handed. He has left sided hemiparesis mild.   Worsening issues over last few months with left middle finger. Usually most mornings - has to pull finger open- can be sore when he opens it. Usually during the day as uses it more it tends to get better and does not catch.   A/P: Will use splint for 6 weeks particularly at night as mornings are the worst. Trial mobic 7-10 days. Sports medicine referral if notimproving  We did not have the long aluminum cut-able splints so we used a finger splint- this may not be adequate. Discussed gentle stretching as well  Future Appointments Date Time Provider [place]  01/08/2017 8:45 AM Marin Olp, MD [facility] None  04/25/2017 8:00 AM [facility] 6 [facility]  04/25/2017 8:45 AM Nickel, Sharmon Leyden, NP [facility]   Meds ordered this encounter  Medications  . meloxicam (MOBIC) 7.5 MG tablet    Sig: Take 1 tablet (7.5 mg total) by mouth daily.    Dispense:  10 tablet    Refill:  0    Return precautions advised.  Garret Reddish, MD

## 2016-11-12 NOTE — Addendum Note (Signed)
Addended by: Lucianne Lei M on: 11/12/2016 10:24 AM   Modules accepted: Orders

## 2017-01-08 ENCOUNTER — Other Ambulatory Visit: Payer: Medicare Other

## 2017-01-08 ENCOUNTER — Ambulatory Visit (INDEPENDENT_AMBULATORY_CARE_PROVIDER_SITE_OTHER): Payer: Medicare Other | Admitting: Family Medicine

## 2017-01-08 ENCOUNTER — Encounter: Payer: Self-pay | Admitting: Family Medicine

## 2017-01-08 VITALS — BP 142/84 | HR 51 | Temp 97.6°F | Ht 66.0 in | Wt 147.2 lb

## 2017-01-08 DIAGNOSIS — E785 Hyperlipidemia, unspecified: Secondary | ICD-10-CM | POA: Diagnosis not present

## 2017-01-08 DIAGNOSIS — D72819 Decreased white blood cell count, unspecified: Secondary | ICD-10-CM | POA: Insufficient documentation

## 2017-01-08 DIAGNOSIS — I639 Cerebral infarction, unspecified: Secondary | ICD-10-CM

## 2017-01-08 DIAGNOSIS — M65332 Trigger finger, left middle finger: Secondary | ICD-10-CM | POA: Diagnosis not present

## 2017-01-08 DIAGNOSIS — I10 Essential (primary) hypertension: Secondary | ICD-10-CM | POA: Diagnosis not present

## 2017-01-08 DIAGNOSIS — I69359 Hemiplegia and hemiparesis following cerebral infarction affecting unspecified side: Secondary | ICD-10-CM | POA: Diagnosis not present

## 2017-01-08 MED ORDER — AMLODIPINE BESYLATE 2.5 MG PO TABS: 2.5000 mg | ORAL_TABLET | Freq: Every day | ORAL | 3 refills | Status: DC

## 2017-01-08 NOTE — Assessment & Plan Note (Signed)
Splint 6 weeks at night- mobic 7-10 days. Splint in particular was very helpful. He has one for home and one for travel and hand has been much more comfortable for him- important considering this is the side of his hemiparesis.

## 2017-01-08 NOTE — Patient Instructions (Addendum)
I would also like for you to sign up for an annual wellness visit with our nurse, Cassie, who specializes in the annual wellness exam. This is a free benefit under medicare that may help [place] find additional ways to help you. Some highlights are reviewing medications, lifestyle, and doing a dementia screen.   Please get the 8 Am visit on Thursday morning. Also have them schedule you for labs at that time.   Glad you are doing so well! As long as blood pressure at home <140/90 vast majority of the time then continue amlodipine 2.5mg  alone  Glad the splint is helping

## 2017-01-08 NOTE — Assessment & Plan Note (Addendum)
S: controlled at home on amlodipine 2.5 mg. Home cuff measures slightly higher than our readings. Home readings almost always ,140/90- very sparingly just above this BP Readings from Last 3 Encounters:  01/08/17 (!) 142/84 vs. Home cuff which was slightly higher  11/12/16 132/86  10/18/16 (!) 148/83  A/P: We discussed blood pressure goal of <140/90 and he is very well controlled at home. Continue current medications- amlodipine 2.5mg .

## 2017-01-08 NOTE — Assessment & Plan Note (Signed)
S: well controlled on atorvastatin 20mg  every other daywith last LDL of 42. No myalgias.  A/P: continue current meds with LDL goal under 70 ideally

## 2017-01-08 NOTE — Assessment & Plan Note (Signed)
L hemiparesis- numbness left side. He is left handed. Rarely drops objects. Trigger finger bothered him since was on same side/hand that he has the most issues- he is thankful this has improved.

## 2017-01-08 NOTE — Progress Notes (Signed)
Subjective:  Ricky Stiff. is a 71 y.o. year old very pleasant male patient who presents for/with See problem oriented charting ROS- No chest pain or shortness of breath. No headache or blurry vision.  Does have left sided decreased sensation compared to right  Past Medical History-  Patient Active Problem List   Diagnosis Date Noted  . Hemiparesis affecting dominant side as late effect of stroke ([place]) 03/08/2016    Priority: High  . History of CVA (cerebrovascular accident) 02/28/2016    Priority: High  . Carotid artery stenosis, symptomatic, right 03/19/2016    Priority: Medium  . Hyperlipidemia 03/08/2016    Priority: Medium  . Essential hypertension 12/20/2014    Priority: Medium  . OA (osteoarthritis) of knee 12/14/2013    Priority: Low  . Trigger middle finger of left hand 11/12/2016   Medications- reviewed and updated Current Outpatient Medications  Medication Sig Dispense Refill  . acetaminophen (TYLENOL) 500 MG tablet Take 500-1,000 mg by mouth every 6 (six) hours as needed (for pain.).     Marland Kitchen amLODipine (NORVASC) 2.5 MG tablet Take 1 tablet (2.5 mg total) by mouth daily. 90 tablet 3  . aspirin EC 325 MG tablet Take 1 tablet (325 mg total) by mouth daily. 30 tablet 0  . atorvastatin (LIPITOR) 20 MG tablet Take 1 tablet (20 mg total) by mouth daily at 6 PM. (Patient taking differently: Take 20 mg by mouth daily at 6 PM. Every other day in in the morning) 90 tablet 3  . Multiple Vitamin (MULTIVITAMIN WITH MINERALS) TABS tablet Take 1 tablet by mouth daily.     Objective: BP (!) 160/86 (BP Location: Left Arm, Patient Position: Sitting, Cuff Size: Large)   Pulse (!) 51   Temp 97.6 F (36.4 C) (Oral)   Ht 5\' 6"  (1.676 m)   Wt 147 lb 3.2 oz (66.8 kg)   SpO2 98%   BMI 23.76 kg/m  Gen: NAD, resting comfortably CV: RRR no murmurs rubs or gallops Lungs: CTAB no crackles, wheeze, rhonchi Abdomen: soft/nontender/nondistended/normal bowel sounds. No rebound or guarding.   Ext: no edema Skin: warm, dry Neuro: grossly normal, moves all extremities, decrease sensation with gross touch on left hand compared to right per his baseline Msk: unable to get left middle finger to trigger today  Assessment/Plan:  71 y.o. male requests health Maintenance counseling: 1. Anticipatory guidance: Patient counseled regarding regular dental exams-q4 months, eye exams- usually yearly, wearing seatbelts.  2. Risk factor reduction:  Advised patient of need for regular exercise and diet rich and fruits and vegetables to reduce risk of heart attack and stroke. He is maintaining his 5 days a week exercise from 1-2.5 hours a day- see last AWV for details. Weight satbale/healthy.  3. Immunizations/screenings/ancillary studies- could get shingles vaccinations- having hard time locating dose Immunization History  Administered Date(s) Administered  . Influenza, High Dose Seasonal PF 01/10/2016, 11/12/2016  . Influenza,inj,Quad PF,6+ Mos 11/30/2013, 12/20/2014  . Pneumococcal Conjugate-13 11/30/2013  . Pneumococcal Polysaccharide-23 12/20/2014  . Td 04/20/2014  4. Prostate cancer screening-  would not advise screening given above USPTF age recommendations 50-70. He is ok with this.  Lab Results  Component Value Date   PSA 1.22 11/30/2013   5. Colon cancer screening -  did cologuard last year. Due aagain 2020 as had 2017 6. Skin cancer screening- no dermatologist. advised regular sunscreen use. Denies worrisome, changing, or new skin lesions. I asked him to monitor his skin for growing/changing lesions- a few would consider  biopsy if they do grow.   Other issues 1. 1 day too early for labs- had advised 01/15/17 for this visit. Labs at [facility] in 2 days due to not being full year yet 2. S/p carotid endarterectomy 03/2016. Doing well  Essential hypertension S: controlled at home on amlodipine 2.5 mg. Home cuff measures slightly higher than our readings. Home readings almost always ,140/90-  very sparingly just above this BP Readings from Last 3 Encounters:  01/08/17 (!) 142/84 vs. Home cuff which was slightly higher  11/12/16 132/86  10/18/16 (!) 148/83  A/P: We discussed blood pressure goal of <140/90 and he is very well controlled at home. Continue current medications- amlodipine 2.5mg .   Hyperlipidemia S: well controlled on atorvastatin 20mg  every other daywith last LDL of 42. No myalgias.  A/P: continue current meds with LDL goal under 70 ideally  Trigger middle finger of left hand Splint 6 weeks at night- mobic 7-10 days. Splint in particular was very helpful. He has one for home and one for travel and hand has been much more comfortable for him- important considering this is the side of his hemiparesis.   Hemiparesis affecting dominant side as late effect of stroke ([facility]) L hemiparesis- numbness left side. He is left handed. Rarely drops objects. Trigger finger bothered him since was on same side/hand that he has the most issues- he is thankful this has improved.   Leukopenia Mild 2016-2018 intermittent Lab Results  Component Value Date   WBC 3.8 (L) 10/16/2016   HGB 13.4 10/16/2016   HCT 40.1 10/16/2016   MCV 95.3 10/16/2016   PLT 227.0 10/16/2016  reviewed last cbc. Will get update today and likely every 6 months. Consider heme referral if other cell lines worsen.    Future Appointments  Date Time Provider [place]  01/10/2017  8:00 AM Williemae Area, RN [facility]  01/10/2017  9:00 AM [facility] LAB [facility]  04/25/2017  8:00 AM [facility] 6 [facility]  04/25/2017  8:45 AM Nickel, Sharmon Leyden, NP [facility]  07/24/2017  8:45 AM Yong Channel Brayton Mars, MD [facility]   Orders Placed This Encounter  Procedures  . Lipid panel    Standing Status:   Future    Standing Expiration Date:   01/08/2018  . CBC with Differential/Platelet    Standing Status:   Future    Standing Expiration Date:   01/08/2018  . Comprehensive metabolic panel     [place]    Standing Status:   Future    Standing Expiration Date:   01/08/2018  . POCT Urinalysis Dipstick (Automated)    Standing Status:   Future    Standing Expiration Date:   02/08/2017    Meds ordered this encounter  Medications  . amLODipine (NORVASC) 2.5 MG tablet    Sig: Take 1 tablet (2.5 mg total) by mouth daily.    Dispense:  90 tablet    Refill:  3    Return precautions advised.  Garret Reddish, MD

## 2017-01-08 NOTE — Assessment & Plan Note (Signed)
Mild 2016-2018 intermittent Lab Results  Component Value Date   WBC 3.8 (L) 10/16/2016   HGB 13.4 10/16/2016   HCT 40.1 10/16/2016   MCV 95.3 10/16/2016   PLT 227.0 10/16/2016  reviewed last cbc. Will get update today and likely every 6 months. Consider heme referral if other cell lines worsen.

## 2017-01-09 NOTE — Progress Notes (Signed)
Subjective:   Ricky [name]. is a 71 y.o. male who presents for Medicare Annual/Subsequent preventive examination.  Review of Systems:  No ROS.  Medicare Wellness Visit.  Additional risk factors are reflected in the social history.  Cardiac Risk Factors include: advanced age (>38men, >59 women);dyslipidemia;family history of premature cardiovascular disease;hypertension;male gender    Labs being drawn today  Objective:    Vitals: BP (!) 144/90   Pulse (!) 51   Ht 5\' 6"  (1.676 m)   Wt 148 lb 2 oz (67.2 kg)   SpO2 96%   BMI 23.91 kg/m   Body mass index is 23.91 kg/m.  Advanced Directives 01/10/2017 10/18/2016 03/19/2016 03/09/2016 03/05/2016 02/29/2016 02/28/2016  Does Patient Have a Medical Advance Directive? Yes Yes Yes Yes No No No  Type of Advance Directive - [place];Living will Living will Living will - - -  Does patient want to make changes to medical advance directive? - - No - Patient declined Yes (MAU/Ambulatory/Procedural Areas - Information given) - - -  Copy of [place] in Chart? - No - copy requested - - - - -  Would patient like information on creating a medical advance directive? - - No - Patient declined - No - Patient declined No - Patient declined -    Tobacco Social History   Tobacco Use  Smoking Status Never Smoker  Smokeless Tobacco Never Used     Counseling given: Yes  Pscyhosocial Wife had cataract surgery Otherwise is doing well  No falls Home is 3 stories with a basement Plans to age in place at this time Will consider LTC options if needed    Clinical Intake: BP good at home; 130's at home At the dentist it was 110/68 12/05  Diet  Breakfast; If he goes to the gym, drinks protein shake (whey) banana and 8 oz of milk Lunch; eats a lot of vegetables Both he and wife cook and watch their weight   Exercise x 5 days a week Stays 90 to 180 minutes  Rides the bike x .5 hour    Hearing Screening   125Hz  250Hz  500Hz  1000Hz  2000Hz  3000Hz  4000Hz  6000Hz  8000Hz   Right ear:       100    Left ear:       100    Comments: No hearing issues He rec'd annual physical and this was very extensive  Vision Screening Comments: Vision checks every year  [facility] this year;  Upgraded RX No other issues    Pain no pain; occasionally "fake knee gets sore" can't stand for long periods    Stress;  Well managed now  Holiday stress 1 son and grand children lives in [place], [place] Lived there 20 years  Came here for work in 86 -         Past Medical History:  Diagnosis Date  . Arthritis   . Carotid artery occlusion   . Complication of anesthesia    slow to wake up after shoulder surgeries  . Hyperlipidemia 03/08/2016   LDL 75 pre stroke. Goal <70 so on atorvastatin 20mg  every other day  . Hypertension   . Peripheral vascular disease ([place])   . Pneumonia   . Stroke [facility])    still has decreased sensation in left hand and arm (as of 03/09/16)   Past Surgical History:  Procedure Laterality Date  . CAROTID ENDARTERECTOMY    . ENDARTERECTOMY Right 03/19/2016   Procedure: ENDARTERECTOMY CAROTID;  Surgeon: Elam Dutch, MD;  Location: [facility];  Service: Vascular;  Laterality: Right;  . PATCH ANGIOPLASTY Right 03/19/2016   Procedure: PATCH ANGIOPLASTY OF RIGHT CAROTID USING HEMASHIELD PLATINUM FINESSE;  Surgeon: Elam Dutch, MD;  Location: [facility] [place];  Service: Vascular;  Laterality: Right;  . SHOULDER SURGERY Bilateral    left- injections intermittent now  Rotator cuff repair on both shoulders, 2 different surgeries  . TONSILLECTOMY    . TOTAL KNEE ARTHROPLASTY Left 12/14/2013   Procedure: LEFT TOTAL KNEE ARTHROPLASTY;  Surgeon: Gearlean Alf, MD;  Location: [facility];  Service: Orthopedics;  Laterality: Left;   Family History  Problem Relation Age of Onset  . Heart disease Father        in 67s- specifics unclear  . Other Mother        passed in late 14s   Social History    Socioeconomic History  . Marital status: Married    Spouse name: Not on file  . Number of children: Not on file  . Years of education: Not on file  . Highest education level: Not on file  Social Needs  . Financial resource strain: Not on file  . Food insecurity - worry: Not on file  . Food insecurity - inability: Not on file  . Transportation needs - medical: Not on file  . Transportation needs - non-medical: Not on file  Occupational History  . Not on file  Tobacco Use  . Smoking status: Never Smoker  . Smokeless tobacco: Never Used  Substance and Sexual Activity  . Alcohol use: No    Comment: former drinker  . Drug use: No  . Sexual activity: Yes  Other Topics Concern  . Not on file  Social History Narrative   Married. 1 son. 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       Hobbies: gym daily- riding exercise bike plus weights/machines, photography- used to do a lot of black and white, gardening   No changes; stays engaged; son still doing well in [place]   Outpatient Encounter Medications as of 01/10/2017  Medication Sig  . acetaminophen (TYLENOL) 500 MG tablet Take 500-1,000 mg by mouth every 6 (six) hours as needed (for pain.).   Marland Kitchen amLODipine (NORVASC) 2.5 MG tablet Take 1 tablet (2.5 mg total) by mouth daily.  Marland Kitchen aspirin EC 325 MG tablet Take 1 tablet (325 mg total) by mouth daily.  Marland Kitchen atorvastatin (LIPITOR) 20 MG tablet Take 1 tablet (20 mg total) by mouth daily at 6 PM. (Patient taking differently: Take 20 mg by mouth daily at 6 PM. Every other day in in the morning)  . Multiple Vitamin (MULTIVITAMIN WITH MINERALS) TABS tablet Take 1 tablet by mouth daily.   No facility-administered encounter medications on file as of 01/10/2017.     Activities of Daily Living In your present state of health, do you have any difficulty performing the following activities: 01/10/2017 03/19/2016  Hearing? N N  Vision? N N   Difficulty concentrating or making decisions? N N  Walking or climbing stairs? N N  Dressing or bathing? N N  Doing errands, shopping? N N  Preparing Food and eating ? N -  Using the Toilet? N -  In the past six months, have you accidently leaked urine? N -  Do you have problems with loss of bowel control? N -  Managing your Medications? N -  Managing your Finances?  N -  Housekeeping or managing your Housekeeping? N -  Some recent data might be hidden    Patient Care Team: Marin Olp, MD as PCP - General (Family Medicine)   Assessment:    Physical assessment deferred to PCP.  Exercise Activities and Dietary recommendations Current Exercise Habits: Structured exercise class, Type of exercise: stretching;walking;strength training/weights(always involved in sports and personal training ), Time (Minutes): > 60, Frequency (Times/Week): 5, Weekly Exercise (Minutes/Week): 0, Intensity: Moderate  Goals    . Patient Stated     To maintain your health; keep weight in check Keep exercises       Fall Risk Fall Risk  01/10/2017 01/10/2016 12/20/2014 11/30/2013  Falls in the past year? No No No No   Depression Screen PHQ 2/9 Scores 01/10/2017 01/10/2016 12/20/2014 11/30/2013  PHQ - 2 Score 0 0 0 0   States he has ups and downs, but nothing that stays with him Does feel renewed   Cognitive Function MMSE - Mini Mental State Exam 01/10/2017  Not completed: (No Data)     Ad8 score reviewed for issues:  Issues making decisions:  Less interest in hobbies / activities:  Repeats questions, stories (family complaining):  Trouble using ordinary gadgets (microwave, computer, phone):  Forgets the month or year:   Mismanaging finances:   Remembering appts:  Daily problems with thinking and/or memory: Ad8 score is= 0       Immunization History  Administered Date(s) Administered  . Influenza, High Dose Seasonal PF 01/10/2016, 11/12/2016  . Influenza,inj,Quad PF,6+ Mos  11/30/2013, 12/20/2014  . Pneumococcal Conjugate-13 11/30/2013  . Pneumococcal Polysaccharide-23 12/20/2014  . Td 04/20/2014   Screening Tests Health Maintenance  Topic Date Due  . Fecal DNA (Cologuard)  01/19/2019  . TETANUS/TDAP  04/19/2024  . INFLUENZA VACCINE  Completed  . Hepatitis C Screening  Completed  . PNA vac Low Risk Adult  Completed      Plan:   Follow up with PCP as directed.  I have personally reviewed and noted the following in the patient's chart:   . Medical and social history . Use of alcohol, tobacco or illicit drugs  . Current medications and supplements . Functional ability and status . Nutritional status . Physical activity . Advanced directives . List of other physicians . Vitals . Screenings to include cognitive, depression, and falls . Referrals and appointments  In addition, I have reviewed and discussed with patient certain preventive protocols, quality metrics, and best practice recommendations. A written personalized care plan for preventive services as well as general preventive health recommendations were provided to patient.     Wynetta Fines, RN  01/10/2017

## 2017-01-09 NOTE — Progress Notes (Signed)
PCP notes:   Health maintenance: NA. There are no preventive care reminders to display for this patient. Given education regarding the shingrix Given education on the Tdap if he is around newborns. (no newborns in the family)  Abnormal screenings:  BP was elevated 144/ 90 and rate was 51 which is states is normal for him. Exercises 90 to 180 minutes a day.  States he BP was 120/80 range at the dentist yesterday   Patient concerns:  None at present; Recovering well from stroke in January. Continues to work and stretch tendons in left hand and continues to wear a splint at night to prevent trigger finger   Nurse concerns: As noted   Next PCP appt: 07/24/2017

## 2017-01-09 NOTE — Progress Notes (Signed)
Pre visit review using our clinic review tool, if applicable. No additional management support is needed unless otherwise documented below in the visit note. 

## 2017-01-10 ENCOUNTER — Other Ambulatory Visit (INDEPENDENT_AMBULATORY_CARE_PROVIDER_SITE_OTHER): Payer: Medicare Other

## 2017-01-10 ENCOUNTER — Ambulatory Visit (INDEPENDENT_AMBULATORY_CARE_PROVIDER_SITE_OTHER): Payer: Medicare Other | Admitting: *Deleted

## 2017-01-10 ENCOUNTER — Encounter: Payer: Self-pay | Admitting: Family Medicine

## 2017-01-10 ENCOUNTER — Other Ambulatory Visit: Payer: Self-pay

## 2017-01-10 ENCOUNTER — Encounter: Payer: Self-pay | Admitting: *Deleted

## 2017-01-10 VITALS — BP 144/90 | HR 51 | Ht 66.0 in | Wt 148.1 lb

## 2017-01-10 DIAGNOSIS — E785 Hyperlipidemia, unspecified: Secondary | ICD-10-CM | POA: Diagnosis not present

## 2017-01-10 DIAGNOSIS — I10 Essential (primary) hypertension: Secondary | ICD-10-CM

## 2017-01-10 DIAGNOSIS — Z0000 Encounter for general adult medical examination without abnormal findings: Secondary | ICD-10-CM

## 2017-01-10 DIAGNOSIS — D72819 Decreased white blood cell count, unspecified: Secondary | ICD-10-CM

## 2017-01-10 LAB — LIPID PANEL
CHOLESTEROL: 107 mg/dL (ref 0–200)
HDL: 60.4 mg/dL (ref >39.00)
LDL Cholesterol: 39 mg/dL (ref 0–99)
NonHDL: 46.8
TRIGLYCERIDES: 38 mg/dL (ref 0.0–149.0)
Total CHOL/HDL Ratio: 2
VLDL: 7.6 mg/dL (ref 0.0–40.0)

## 2017-01-10 LAB — CBC WITH DIFFERENTIAL/PLATELET
BASOS ABS: 0 10*3/uL (ref 0.0–0.1)
Basophils Relative: 0.5 % (ref 0.0–3.0)
EOS ABS: 0.2 10*3/uL (ref 0.0–0.7)
Eosinophils Relative: 8.3 % — ABNORMAL HIGH (ref 0.0–5.0)
HCT: 41.2 % (ref 39.0–52.0)
Hemoglobin: 13.9 g/dL (ref 13.0–17.0)
LYMPHS ABS: 0.9 10*3/uL (ref 0.7–4.0)
Lymphocytes Relative: 36.2 % (ref 12.0–46.0)
MCHC: 33.7 g/dL (ref 30.0–36.0)
MCV: 95.8 fl (ref 78.0–100.0)
MONO ABS: 0.3 10*3/uL (ref 0.1–1.0)
Monocytes Relative: 13.3 % — ABNORMAL HIGH (ref 3.0–12.0)
NEUTROS PCT: 41.7 % — AB (ref 43.0–77.0)
Neutro Abs: 1.1 10*3/uL — ABNORMAL LOW (ref 1.4–7.7)
Platelets: 218 10*3/uL (ref 150.0–400.0)
RBC: 4.3 Mil/uL (ref 4.22–5.81)
RDW: 13.6 % (ref 11.5–15.5)
WBC: 2.6 10*3/uL — AB (ref 4.0–10.5)

## 2017-01-10 LAB — COMPREHENSIVE METABOLIC PANEL
ALBUMIN: 4 g/dL (ref 3.5–5.2)
ALK PHOS: 54 U/L (ref 39–117)
ALT: 21 U/L (ref 0–53)
AST: 27 U/L (ref 0–37)
BILIRUBIN TOTAL: 0.6 mg/dL (ref 0.2–1.2)
BUN: 16 mg/dL (ref 6–23)
CO2: 29 mEq/L (ref 19–32)
Calcium: 9 mg/dL (ref 8.4–10.5)
Chloride: 106 mEq/L (ref 96–112)
Creatinine, Ser: 0.8 mg/dL (ref 0.40–1.50)
GFR: 100.68 mL/min (ref >60.00)
GLUCOSE: 93 mg/dL (ref 70–99)
Potassium: 4.2 mEq/L (ref 3.5–5.1)
SODIUM: 141 meq/L (ref 135–145)
TOTAL PROTEIN: 6.3 g/dL (ref 6.0–8.3)

## 2017-01-10 LAB — POC URINALSYSI DIPSTICK (AUTOMATED)
Bilirubin, UA: NEGATIVE
Blood, UA: NEGATIVE
GLUCOSE UA: NEGATIVE
KETONES UA: NEGATIVE
LEUKOCYTES UA: NEGATIVE
Nitrite, UA: POSITIVE
PROTEIN UA: NEGATIVE
Spec Grav, UA: >=1.03 — AB (ref 1.010–1.025)
Urobilinogen, UA: 0.2 E.U./dL
pH, UA: 5.5 (ref 5.0–8.0)

## 2017-01-10 NOTE — Patient Instructions (Addendum)
Ricky [name] , Thank you for taking time to come for your Medicare Wellness Visit. I appreciate your ongoing commitment to your health goals. Please review the following plan we discussed and let me know if I can assist you in the future.  Will try to update your advanced Directive in the next year   Shingrix is a vaccine for the prevention of Shingles in Adults 50 and older.  If you are on Medicare, you can request a prescription from your doctor to be filled at a pharmacy.  Please check with your benefits regarding applicable copays or out of pocket expenses.  The Shingrix is given in 2 vaccines approx 8 weeks apart. You must receive the 2nd dose prior to 6 months from receipt of the first.   A Tetanus is recommended every 10 years. Medicare covers a tetanus if you have a cut or wound; otherwise, there may be a charge. If you had not had a tetanus with pertusses, known as the Tdap, you can take this anytime.    These are the goals we discussed: Goals    . Patient Stated     To maintain your health; keep weight in check Keep exercises        This is a list of the screening recommended for you and due dates:  Health Maintenance  Topic Date Due  . Cologuard (Stool DNA test)  01/19/2019  . Tetanus Vaccine  04/19/2024  . Flu Shot  Completed  .  Hepatitis C: One time screening is recommended by Center for Disease Control  (CDC) for  adults born from [birth year] through 1965.   Completed  . Pneumonia vaccines  Completed      Fall Prevention in the Home Falls can cause injuries. They can happen to people of all ages. There are many things you can do to make your home safe and to help prevent falls. What can I do on the outside of my home?  Regularly fix the edges of walkways and driveways and fix any cracks.  Remove anything that might make you trip as you walk through a door, such as a raised step or threshold.  Trim any bushes or trees on the path to your home.  Use bright outdoor  lighting.  Clear any walking paths of anything that might make someone trip, such as rocks or tools.  Regularly check to see if handrails are loose or broken. Make sure that both sides of any steps have handrails.  Any raised decks and porches should have guardrails on the edges.  Have any leaves, snow, or ice cleared regularly.  Use sand or salt on walking paths during winter.  Clean up any spills in your garage right away. This includes oil or grease spills. What can I do in the bathroom?  Use night lights.  Install grab bars by the toilet and in the tub and shower. Do not use towel bars as grab bars.  Use non-skid mats or decals in the tub or shower.  If you need to sit down in the shower, use a plastic, non-slip stool.  Keep the floor dry. Clean up any water that spills on the floor as soon as it happens.  Remove soap buildup in the tub or shower regularly.  Attach bath mats securely with double-sided non-slip rug tape.  Do not have throw rugs and other things on the floor that can make you trip. What can I do in the bedroom?  Use night lights.  Make  sure that you have a light by your bed that is easy to reach.  Do not use any sheets or blankets that are too big for your bed. They should not hang down onto the floor.  Have a firm chair that has side arms. You can use this for support while you get dressed.  Do not have throw rugs and other things on the floor that can make you trip. What can I do in the kitchen?  Clean up any spills right away.  Avoid walking on wet floors.  Keep items that you use a lot in easy-to-reach places.  If you need to reach something above you, use a strong step stool that has a grab bar.  Keep electrical cords out of the way.  Do not use floor polish or wax that makes floors slippery. If you must use wax, use non-skid floor wax.  Do not have throw rugs and other things on the floor that can make you trip. What can I do with my  stairs?  Do not leave any items on the stairs.  Make sure that there are handrails on both sides of the stairs and use them. Fix handrails that are broken or loose. Make sure that handrails are as long as the stairways.  Check any carpeting to make sure that it is firmly attached to the stairs. Fix any carpet that is loose or worn.  Avoid having throw rugs at the top or bottom of the stairs. If you do have throw rugs, attach them to the floor with carpet tape.  Make sure that you have a light switch at the top of the stairs and the bottom of the stairs. If you do not have them, ask someone to add them for you. What else can I do to help prevent falls?  Wear shoes that: ? Do not have high heels. ? Have rubber bottoms. ? Are comfortable and fit you well. ? Are closed at the toe. Do not wear sandals.  If you use a stepladder: ? Make sure that it is fully opened. Do not climb a closed stepladder. ? Make sure that both sides of the stepladder are locked into place. ? Ask someone to hold it for you, if possible.  Clearly mark and make sure that you can see: ? Any grab bars or handrails. ? First and last steps. ? Where the edge of each step is.  Use tools that help you move around (mobility aids) if they are needed. These include: ? Canes. ? Walkers. ? Scooters. ? Crutches.  Turn on the lights when you go into a dark area. Replace any light bulbs as soon as they burn out.  Set up your furniture so you have a clear path. Avoid moving your furniture around.  If any of your floors are uneven, fix them.  If there are any pets around you, be aware of where they are.  Review your medicines with your doctor. Some medicines can make you feel dizzy. This can increase your chance of falling. Ask your doctor what other things that you can do to help prevent falls. This information is not intended to replace advice given to you by your health care provider. Make sure you discuss any  questions you have with your health care provider. Document Released: 11/18/2008 Document Revised: 06/30/2015 Document Reviewed: 02/26/2014 Elsevier Interactive Patient Education  2018 [place] Maintenance, Male A healthy lifestyle and preventive care is important for your health and  wellness. Ask your health care provider about what schedule of regular examinations is right for you. What should I know about weight and diet? Eat a Healthy Diet  Eat plenty of vegetables, fruits, whole grains, low-fat dairy products, and lean protein.  Do not eat a lot of foods high in solid fats, added sugars, or salt.  Maintain a Healthy Weight Regular exercise can help you achieve or maintain a healthy weight. You should:  Do at least 150 minutes of exercise each week. The exercise should increase your heart rate and make you sweat (moderate-intensity exercise).  Do strength-training exercises at least twice a week.  Watch Your Levels of Cholesterol and Blood Lipids  Have your blood tested for lipids and cholesterol every 5 years starting at 73 years of age. If you are at high risk for heart disease, you should start having your blood tested when you are 73 years old. You may need to have your cholesterol levels checked more often if: ? Your lipid or cholesterol levels are high. ? You are older than 73 years of age. ? You are at high risk for heart disease.  What should I know about cancer screening? Many types of cancers can be detected early and may often be prevented. Lung Cancer  You should be screened every year for lung cancer if: ? You are a current smoker who has smoked for at least 30 years. ? You are a former smoker who has quit within the past 15 years.  Talk to your health care provider about your screening options, when you should start screening, and how often you should be screened.  Colorectal Cancer  Routine colorectal cancer screening usually begins at 73  years of age and should be repeated every 5-10 years until you are 73 years old. You may need to be screened more often if early forms of precancerous polyps or small growths are found. Your health care provider may recommend screening at an earlier age if you have risk factors for colon cancer.  Your health care provider may recommend using home test kits to check for hidden blood in the stool.  A small camera at the end of a tube can be used to examine your colon (sigmoidoscopy or colonoscopy). This checks for the earliest forms of colorectal cancer.  Prostate and Testicular Cancer  Depending on your age and overall health, your health care provider may do certain tests to screen for prostate and testicular cancer.  Talk to your health care provider about any symptoms or concerns you have about testicular or prostate cancer.  Skin Cancer  Check your skin from head to toe regularly.  Tell your health care provider about any new moles or changes in moles, especially if: ? There is a change in a mole's size, shape, or color. ? You have a mole that is larger than a pencil eraser.  Always use sunscreen. Apply sunscreen liberally and repeat throughout the day.  Protect yourself by wearing long sleeves, pants, a wide-brimmed hat, and sunglasses when outside.  What should I know about heart disease, diabetes, and high blood pressure?  If you are 42-74 years of age, have your blood pressure checked every 3-5 years. If you are 106 years of age or older, have your blood pressure checked every year. You should have your blood pressure measured twice-once when you are at a hospital or clinic, and once when you are not at a hospital or clinic. Record the average of the two measurements.  To check your blood pressure when you are not at a hospital or clinic, you can use: ? An automated blood pressure machine at a pharmacy. ? A home blood pressure monitor.  Talk to your health care provider about your  target blood pressure.  If you are between 39-71 years old, ask your health care provider if you should take aspirin to prevent heart disease.  Have regular diabetes screenings by checking your fasting blood sugar level. ? If you are at a normal weight and have a low risk for diabetes, have this test once every three years after the age of 70. ? If you are overweight and have a high risk for diabetes, consider being tested at a younger age or more often.  A one-time screening for abdominal aortic aneurysm (AAA) by ultrasound is recommended for men aged 93-75 years who are current or former smokers. What should I know about preventing infection? Hepatitis B If you have a higher risk for hepatitis B, you should be screened for this virus. Talk with your health care provider to find out if you are at risk for hepatitis B infection. Hepatitis C Blood testing is recommended for:  Everyone born from [birth year] through 1967.  Anyone with known risk factors for hepatitis C.  Sexually Transmitted Diseases (STDs)  You should be screened each year for STDs including gonorrhea and chlamydia if: ? You are sexually active and are younger than 73 years of age. ? You are older than 73 years of age and your health care provider tells you that you are at risk for this type of infection. ? Your sexual activity has changed since you were last screened and you are at an increased risk for chlamydia or gonorrhea. Ask your health care provider if you are at risk.  Talk with your health care provider about whether you are at high risk of being infected with HIV. Your health care provider may recommend a prescription medicine to help prevent HIV infection.  What else can I do?  Schedule regular health, dental, and eye exams.  Stay current with your vaccines (immunizations).  Do not use any tobacco products, such as cigarettes, chewing tobacco, and e-cigarettes. If you need help quitting, ask your health care  provider.  Limit alcohol intake to no more than 2 drinks per day. One drink equals 12 ounces of beer, 5 ounces of wine, or 1 ounces of hard liquor.  Do not use street drugs.  Do not share needles.  Ask your health care provider for help if you need support or information about quitting drugs.  Tell your health care provider if you often feel depressed.  Tell your health care provider if you have ever been abused or do not feel safe at home. This information is not intended to replace advice given to you by your health care provider. Make sure you discuss any questions you have with your health care provider. Document Released: 07/21/2007 Document Revised: 09/21/2015 Document Reviewed: 10/26/2014 Elsevier Interactive Patient Education  [employer].

## 2017-01-10 NOTE — Progress Notes (Signed)
I have reviewed and agree with note, evaluation, plan.   Evaluated home cuff yesterday and accurate- home #s ok- continue to monitor BP as long as controlled well at home. Agree should continue splint since helpful  Garret Reddish, MD

## 2017-01-17 ENCOUNTER — Encounter: Payer: Self-pay | Admitting: Family Medicine

## 2017-01-17 ENCOUNTER — Other Ambulatory Visit (INDEPENDENT_AMBULATORY_CARE_PROVIDER_SITE_OTHER): Payer: Medicare Other

## 2017-01-17 DIAGNOSIS — D72819 Decreased white blood cell count, unspecified: Secondary | ICD-10-CM

## 2017-01-17 LAB — CBC WITH DIFFERENTIAL/PLATELET
BASOS PCT: 0.3 % (ref 0.0–3.0)
Basophils Absolute: 0 10*3/uL (ref 0.0–0.1)
EOS ABS: 0.3 10*3/uL (ref 0.0–0.7)
Eosinophils Relative: 6.7 % — ABNORMAL HIGH (ref 0.0–5.0)
HCT: 40.6 % (ref 39.0–52.0)
HEMOGLOBIN: 13.5 g/dL (ref 13.0–17.0)
Lymphocytes Relative: 26.4 % (ref 12.0–46.0)
Lymphs Abs: 1.1 10*3/uL (ref 0.7–4.0)
MCHC: 33.3 g/dL (ref 30.0–36.0)
MCV: 96.7 fl (ref 78.0–100.0)
MONO ABS: 0.5 10*3/uL (ref 0.1–1.0)
Monocytes Relative: 12.5 % — ABNORMAL HIGH (ref 3.0–12.0)
NEUTROS ABS: 2.3 10*3/uL (ref 1.4–7.7)
Neutrophils Relative %: 54.1 % (ref 43.0–77.0)
Platelets: 225 10*3/uL (ref 150.0–400.0)
RBC: 4.2 Mil/uL — ABNORMAL LOW (ref 4.22–5.81)
RDW: 13.7 % (ref 11.5–15.5)
WBC: 4.2 10*3/uL (ref 4.0–10.5)

## 2017-04-01 IMAGING — MR MR HEAD WO/W CM
11 of 19 series · 20 of 48 positions shown · IV contrast (multihance)
Comparison: Prior CT from earlier the same day.

CLINICAL DATA: Initial evaluation for acute left hand weakness.
Concern for stroke.

EXAM:
MRI HEAD WITHOUT AND WITH CONTRAST
MRA HEAD WITHOUT CONTRAST
MRA NECK WITHOUT AND WITH CONTRAST
TECHNIQUE: Multiplanar, multiecho pulse sequences of the brain and surrounding
structures were obtained without and with intravenous contrast.
Angiographic images of the Circle of Willis were obtained using MRA
technique without intravenous contrast. Angiographic images of the
neck were obtained using MRA technique without and with intravenous
contrast. Carotid stenosis measurements (when applicable) are
obtained utilizing NASCET criteria, using the distal internal
carotid diameter as the denominator.
CONTRAST:  14mL MULTIHANCE GADOBENATE DIMEGLUMINE 529 MG/ML IV SOLN

[Series 3: DWI · axial · 3.0mm · 0.94mm/px · z∈[-111,+23]mm · 2 of 93 slices shown (1 of 2)]
[im 1/93]
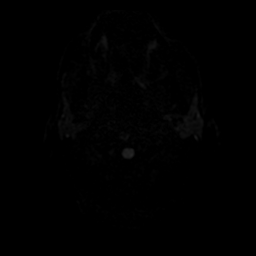
[im 93/93]
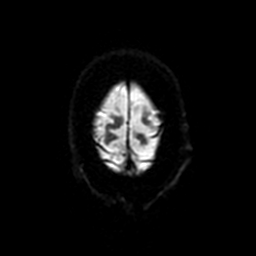

[Series 4: ax (id) 2 · axial · 1.0mm · 0.43mm/px · z∈[-101,-12]mm · 6 of 184 slices shown]
[im 1/184]
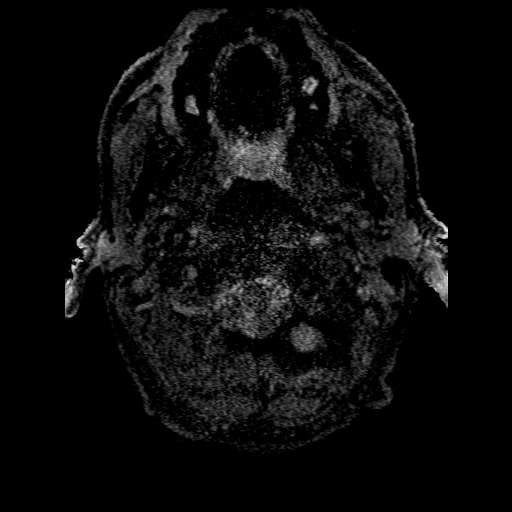
[im 37/184]
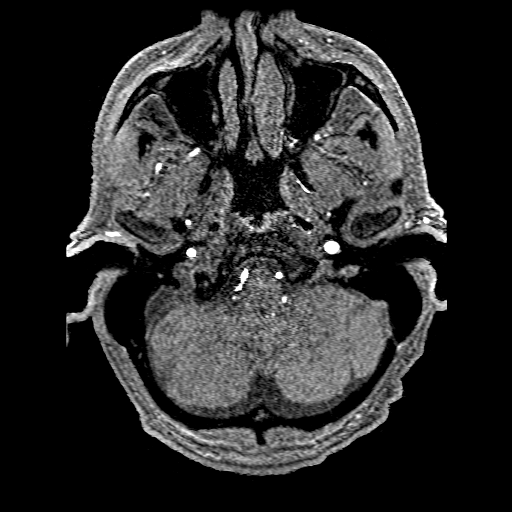
[im 74/184]
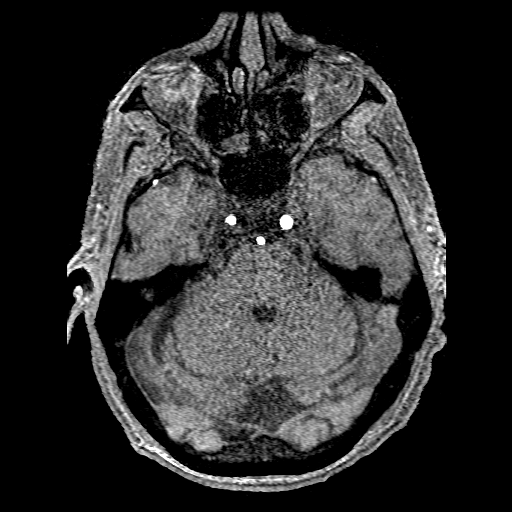
[im 110/184]
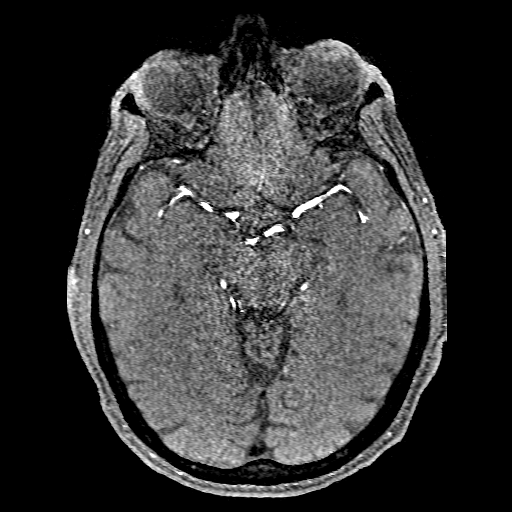
[im 147/184]
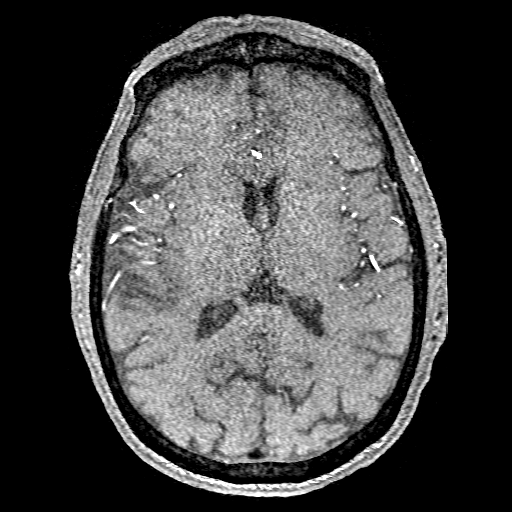
[im 184/184]
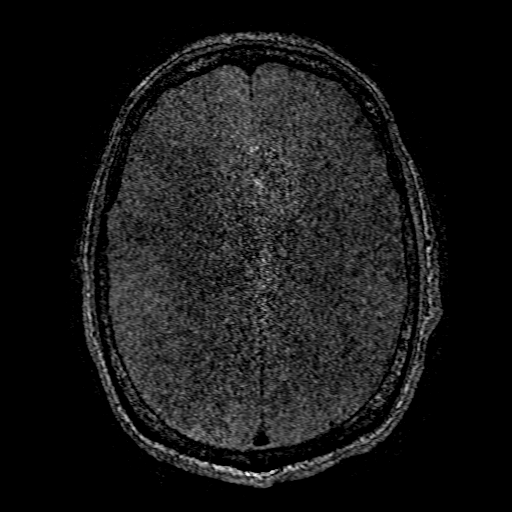

[Series 5: DWI · coronal · 4.0mm · 0.94mm/px · 2 of 68 slices shown (2 of 2)]
[im 1/68]
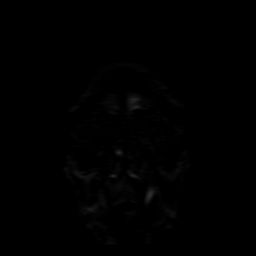
[im 68/68]
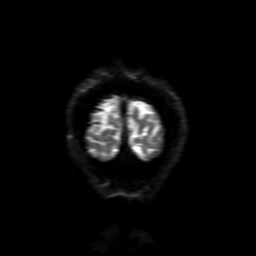

[Series 8: T2 · axial · 5.0mm · 0.47mm/px · 1 of 25 slices shown (1 of 2)]
[im 1/25]
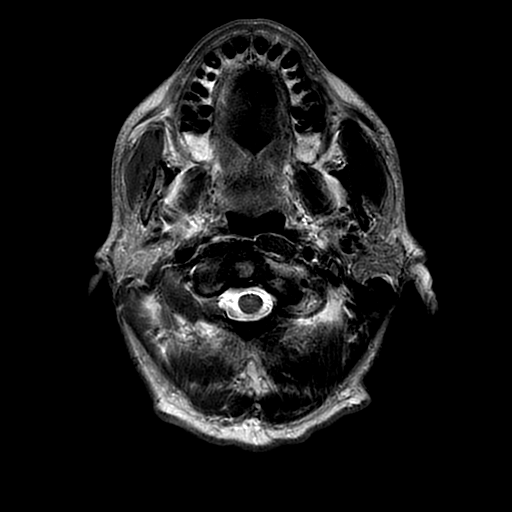

[Series 9: FLAIR · sagittal · 5.0mm · 0.51mm/px · 1 of 25 slices shown (1 of 2)]
[im 1/25]
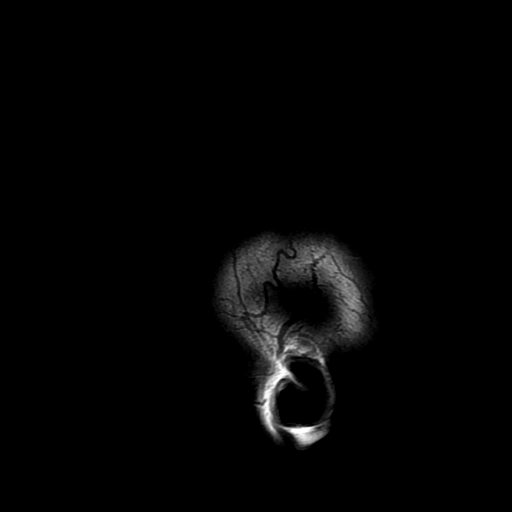

[Series 10: (person_name) · axial · 3.0mm · 0.47mm/px · z∈[-111,+33]mm · 3 of 100 slices shown]
[im 1/100]
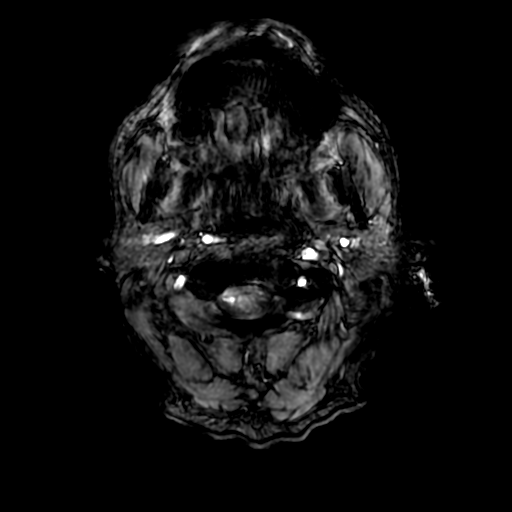
[im 50/100]
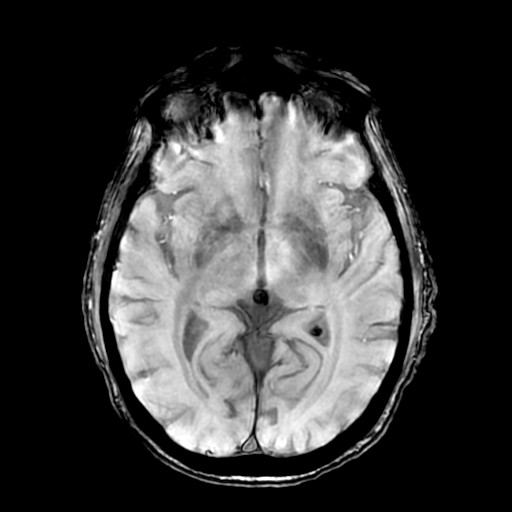
[im 100/100]
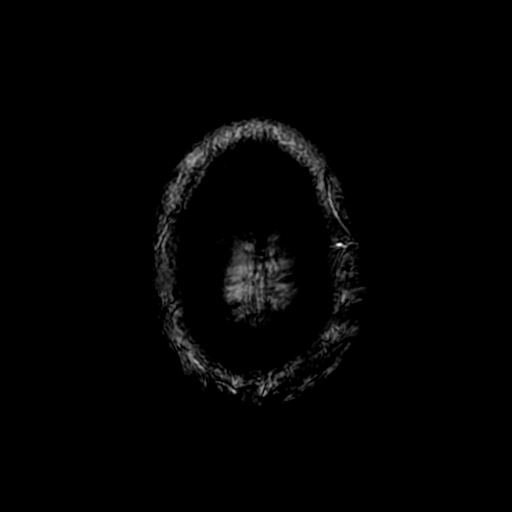

[Series 11: FLAIR · axial · 5.0mm · 0.47mm/px · 1 of 25 slices shown (2 of 2)]
[im 1/25]
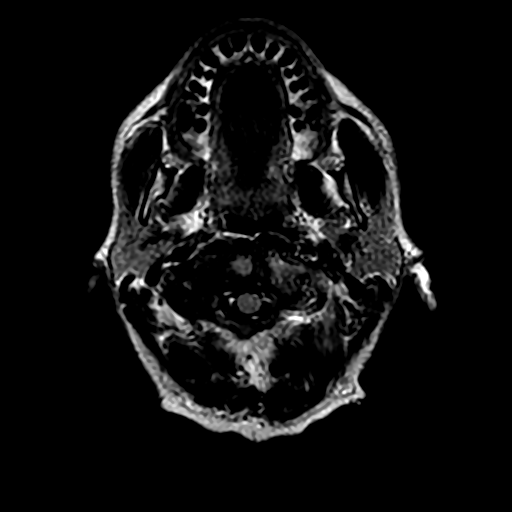

[Series 13: T2 · coronal · 5.0mm · 0.39mm/px · 1 of 29 slices shown (2 of 2)]
[im 1/29]
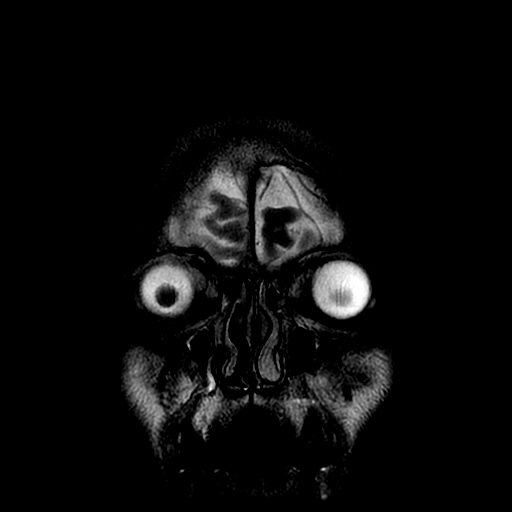

[Series 19: T1 · coronal · 5.0mm · 0.39mm/px · 1 of 29 slices shown]
[im 1/29]
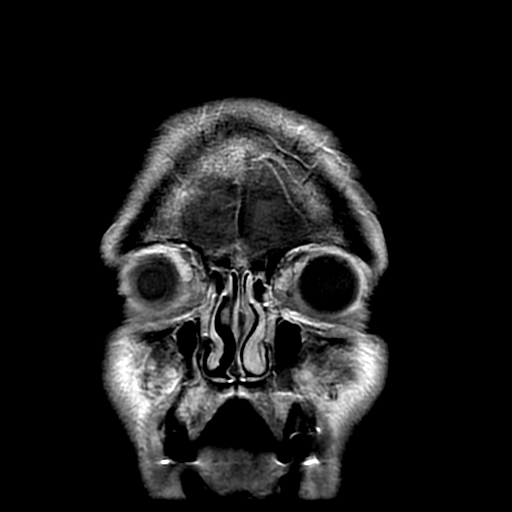

[Series 350: ADC · axial · 3.0mm · 0.94mm/px · 1 of 47 slices shown (1 of 2)]
[im 1/47]
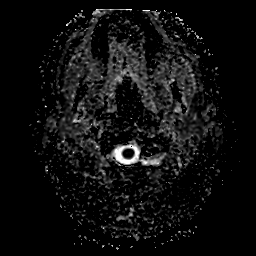

[Series 550: ADC · coronal · 4.0mm · 0.94mm/px · 1 of 34 slices shown (2 of 2)]
[im 1/34]
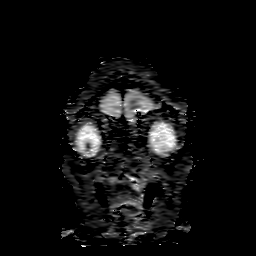

[20 of 48 positions shown; findings below may reference images not displayed]

FINDINGS: MRI HEAD FINDINGS

Brain: Cerebral volume within normal limits for patient age. No
significant cerebral white matter changes identified.

There is confluent restricted diffusion involving the posterior
right insular cortex, with extension into the posterior right
frontal and parietal regions, compatible with acute right MCA
territory infarct. Infarct is largely cortical in nature. Localized
early gyral swelling and edema without significant mass effect.
Susceptibility artifact within the right sylvian fissure likely
reflects thrombus within the distal right M3 branch (series 10,
image 56). No evidence for associated hemorrhage within the infarct
itself.

No other evidence for prior or chronic infarction.

No mass lesion, midline shift, or mass effect. Ventricles are normal
in size without evidence for hydrocephalus. No extra-axial fluid
collection. Major dural sinuses are patent. No abnormal enhancement.

Pituitary gland and suprasellar region within normal limits. Midline
structures intact and normal.

Vascular: Major intracranial vascular flow voids are maintained.
Probable thrombus within a distal right M3 branch as above.

Skull and upper cervical spine: Craniocervical junction normal. Bone
marrow signal intensity within normal limits. No acute scalp soft
tissue abnormality. T1/T2 hypointense nonenhancing lesion within the
left parietal scalp noted, of doubtful clinical significance.

Sinuses/Orbits: Globes and orbital soft tissues within normal
limits. Scattered mucosal thickening throughout the maxillary
sinuses and ethmoidal air cells. No air-fluid level to suggest
active sinus infection. No mastoid effusion. Inner ear structures
within normal limits.

MRA HEAD FINDINGS

ANTERIOR CIRCULATION:

Distal cervical segments of the internal carotid arteries are widely
patent with antegrade flow. Right ICA is diminutive as compared to
the left. Petrous, cavernous, and supraclinoid segments widely
patent bilaterally. Left A1 segment widely patent. Right A1 segment
hypoplastic/absent, which likely accounts for the diminutive right
ICA. Anterior communicating artery normal. Anterior cerebral
arteries widely patent to their distal aspects.

M1 segments patent bilaterally without stenosis or occlusion. MCA
bifurcations normal. No proximal M2 occlusion. Probable right M3 the
occlusion noted, correlating with susceptibility artifact seen on
brain MRI (series 4, image 128). MCA branches otherwise widely
patent bilaterally.

POSTERIOR CIRCULATION:

Vertebral arteries code dominant and patent to the vertebrobasilar
junction. Posterior inferior cerebral arteries patent proximally.
Basilar artery widely patent. Superior cerebral arteries patent
bilaterally. Both of the posterior cerebral artery supplied via the
basilar and are widely patent to their distal aspects.

No aneurysm or vascular malformation.

MRA NECK FINDINGS

Visualized aortic arch of normal caliber with normal 3 vessel
morphology. No high-grade stenosis at the origin of the great
vessels. Visualized subclavian arteries are widely patent.

Right common carotid artery widely patent from its origin to the
bifurcation. There is a severe short-segment atheromatous stenosis
at the proximal right ICA (estimated 80-90%). Stenosis begins at the
bifurcation, and measures approximately 5 mm in length (series
180112, image 4). Right ICA and patent distally to the circle of
Willis without additional stenosis. Overall, right ICA is diminutive
as compared to the left, likely due to hypoplastic a 1 segment.

Left common carotid artery patent from its origin to the
bifurcation. Mild atheromatous irregularity about the left
bifurcation without flow-limiting stenosis. Left ICA widely patent
distally to the skullbase without stenosis or occlusion.

Both of the vertebral arteries arise from the subclavian arteries.
Vertebral arteries are largely code dominant and widely patent
within the neck without stenosis, dissection, or occlusion.
IMPRESSION: MRI HEAD IMPRESSION:

1. Acute moderate-size right MCA territory infarct involving the
right insula as well as the right frontal and parietal lobes as
above. No associated mass effect or hemorrhage.
2. Otherwise negative MRI of the brain.
MRA HEAD IMPRESSION:

1. Occluded distal right M3 branch, compatible with acute right MCA
infarction.
2. Hypoplastic/absent right A1 segment with overall diminutive right
carotid artery system.
3. Otherwise negative MRA of the intracranial circulation.
MRA NECK IMPRESSION:

1. Short-segment severe stenosis at the origin of the right ICA
(estimated 80-90%). Stenosis begins at the bifurcation and measures
approximately 5 mm in length.
2. Widely patent left carotid artery system.
3. Widely patent vertebral arteries within the neck.

## 2017-04-25 ENCOUNTER — Other Ambulatory Visit: Payer: Self-pay

## 2017-04-25 ENCOUNTER — Encounter: Payer: Self-pay | Admitting: Family

## 2017-04-25 ENCOUNTER — Ambulatory Visit (HOSPITAL_COMMUNITY)
Admission: RE | Admit: 2017-04-25 | Discharge: 2017-04-25 | Disposition: A | Discharge status: Home / Self Care | Payer: Medicare Other | Source: Ambulatory Visit | Attending: Vascular Surgery | Admitting: Vascular Surgery

## 2017-04-25 ENCOUNTER — Ambulatory Visit (INDEPENDENT_AMBULATORY_CARE_PROVIDER_SITE_OTHER): Payer: Medicare Other | Admitting: Family

## 2017-04-25 VITALS — BP 148/81 | HR 49 | Temp 97.3°F | Resp 16 | Ht 66.0 in | Wt 144.0 lb

## 2017-04-25 DIAGNOSIS — I6521 Occlusion and stenosis of right carotid artery: Secondary | ICD-10-CM

## 2017-04-25 DIAGNOSIS — I10 Essential (primary) hypertension: Secondary | ICD-10-CM | POA: Insufficient documentation

## 2017-04-25 DIAGNOSIS — Z9889 Other specified postprocedural states: Secondary | ICD-10-CM | POA: Insufficient documentation

## 2017-04-25 DIAGNOSIS — E785 Hyperlipidemia, unspecified: Secondary | ICD-10-CM | POA: Diagnosis not present

## 2017-04-25 DIAGNOSIS — Z09 Encounter for follow-up examination after completed treatment for conditions other than malignant neoplasm: Secondary | ICD-10-CM | POA: Insufficient documentation

## 2017-04-25 NOTE — Patient Instructions (Signed)

## 2017-04-25 NOTE — Progress Notes (Signed)
Vitals:   04/25/17 0839 04/25/17 0843  BP: (!) 151/79 (!) 150/80  Pulse: (!) 48 (!) 49  Resp: 16   Temp: (!) 97.3 F (36.3 C)   SpO2: 100%   Weight: 144 lb (65.3 kg)   Height: 5\' 6"  (1.676 m)

## 2017-04-25 NOTE — Progress Notes (Signed)
Chief Complaint: Follow up Extracranial Carotid Artery Stenosis   History of Present Illness  Ricky [name]. is a 72 y.o. male who sustained a right brain stroke January 2018. His primary symptom was left hand clumsiness.  He underwent a right carotid endarterectomy 03/19/2016 by Dr. Oneida Alar. He has had no symptoms of TIA amaurosis or stroke since that operation. He is on a statin and aspirin.  He still has some subtle clumsiness of his left hand for example dropping his keys when he tries to put them in his car.  Systems: He denies shortness of breath. He denies chest pain.  Dr. Oneida Alar last evaluated pt on 10-18-16. At that time carotid duplex with no significant right side restenosis and less than 40% left internal carotid artery stenosis, antegrade vertebral flow. Doing well status post right carotid endarterectomy done for symptomatic right carotid stenosis and right brain stroke. Pt was to follow-up in 6 months with our nurse practitioner and have a repeat carotid duplex. He will continue his statin and aspirin.  He exercises at a gym 5 days/week.   Pt states his blood pressure at home is about 130/80.   He denies any claudication sx's with walking.   Diabetic: no Tobacco use: non-smoker  Pt meds include: Statin : yes, takes qod ASA: yes Other anticoagulants/antiplatelets: no   Past Medical History:  Diagnosis Date  . Arthritis   . Carotid artery occlusion   . Complication of anesthesia    slow to wake up after shoulder surgeries  . Hyperlipidemia 03/08/2016   LDL 75 pre stroke. Goal <70 so on atorvastatin 20mg  every other day  . Hypertension   . Peripheral vascular disease ([place])   . Pneumonia   . Stroke [facility])    still has decreased sensation in left hand and arm (as of 03/09/16)    Social History Social History   Tobacco Use  . Smoking status: Never Smoker  . Smokeless tobacco: Never Used  Substance Use Topics  . Alcohol use: No    Comment: former  drinker  . Drug use: No    Family History Family History  Problem Relation Age of Onset  . Heart disease Father        in 73s- specifics unclear  . Other Mother        passed in late 60s    Surgical History Past Surgical History:  Procedure Laterality Date  . CAROTID ENDARTERECTOMY    . ENDARTERECTOMY Right 03/19/2016   Procedure: ENDARTERECTOMY CAROTID;  Surgeon: Elam Dutch, MD;  Location: [place];  Service: Vascular;  Laterality: Right;  . PATCH ANGIOPLASTY Right 03/19/2016   Procedure: PATCH ANGIOPLASTY OF RIGHT CAROTID USING HEMASHIELD PLATINUM FINESSE;  Surgeon: Elam Dutch, MD;  Location: [place];  Service: Vascular;  Laterality: Right;  . SHOULDER SURGERY Bilateral    left- injections intermittent now  Rotator cuff repair on both shoulders, 2 different surgeries  . TONSILLECTOMY    . TOTAL KNEE ARTHROPLASTY Left 12/14/2013   Procedure: LEFT TOTAL KNEE ARTHROPLASTY;  Surgeon: Gearlean Alf, MD;  Location: [facility];  Service: Orthopedics;  Laterality: Left;    Allergies  Allergen Reactions  . Amoxicillin Rash    Has patient had a PCN reaction causing immediate rash, facial/tongue/throat swelling, SOB or lightheadedness with hypotension:unsure Has patient had a PCN reaction causing severe rash involving mucus membranes or skin necrosis:unsure Has patient had a PCN reaction that required hospitalization No Has patient had a PCN reaction occurring within  the last 10 years: No If all of the above answers are "NO", then may proceed with Cephalosporin use.     Current Outpatient Medications  Medication Sig Dispense Refill  . acetaminophen (TYLENOL) 500 MG tablet Take 500-1,000 mg by mouth every 6 (six) hours as needed (for pain.).     Marland Kitchen amLODipine (NORVASC) 2.5 MG tablet Take 1 tablet (2.5 mg total) by mouth daily. 90 tablet 3  . aspirin EC 325 MG tablet Take 1 tablet (325 mg total) by mouth daily. 30 tablet 0  . atorvastatin (LIPITOR) 20 MG tablet Take 1 tablet (20 mg  total) by mouth daily at 6 PM. (Patient taking differently: Take 20 mg by mouth daily at 6 PM. Every other day in in the morning) 90 tablet 3  . Multiple Vitamin (MULTIVITAMIN WITH MINERALS) TABS tablet Take 1 tablet by mouth daily.     No current facility-administered medications for this visit.     Review of Systems : See HPI for pertinent positives and negatives.  Physical Examination  Vitals:   04/25/17 0839 04/25/17 0843 04/25/17 0844  BP: (!) 151/79 (!) 150/80 (!) 148/81  Pulse: (!) 48 (!) 49 (!) 49  Resp: 16    Temp: (!) 97.3 F (36.3 C)    SpO2: 100%    Weight: 144 lb (65.3 kg)    Height: 5\' 6"  (1.676 m)     Body mass index is 23.24 kg/m.  General: WDWN fit appearing male in NAD GAIT: normal Eyes: PERRLA HENT: No gross abnormalities.  Pulmonary:  Respirations are non-labored, good air movement, CTAB, no rales, rhonchi, or wheezing. Cardiac: regular rhythm, no detected murmur.  VASCULAR EXAM Carotid Bruits Right Left   Negative Negative     Abdominal aortic pulse is not palpable. Radial pulses are 2+ palpable and equal.                                                                                                                            LE Pulses Right Left       POPLITEAL  not palpable   2+ palpable       POSTERIOR TIBIAL  not palpable   not palpable        DORSALIS PEDIS      ANTERIOR TIBIAL 2+ palpable  1+ palpable     Gastrointestinal: soft, nontender, BS WNL, no r/g, no palpable masses. Musculoskeletal: No muscle atrophy/wasting. M/S 5/5 throughout, extremities without ischemic changes. Skin: No rashes, no ulcers, no cellulitis.   Neurologic:  A&O X 3; appropriate affect, sensation is normal; speech is normal, CN 2-12 intact, pain and light touch intact in extremities, motor exam as listed above. Psychiatric: Normal thought content, mood appropriate to clinical situation.       Assessment: Ricky [name]. is a 72 y.o. male who sustained  a right brain stroke January 2018. His primary symptom was left hand clumsiness.  He underwent a right carotid endarterectomy 03/19/2016 by  Dr. Oneida Alar. He has had no symptoms of TIA amaurosis or stroke since that operation.     DATA Carotid Duplex (04/25/17): Right ICA: CEA site, no restenosis  Left ICA: 1-39% stenosis Bilateral vertebral artery flow is antegrade.  Bilateral subclavian artery waveforms are normal.  No change since the exam on 10-18-16.   Plan: Follow-up in 1 year with Carotid Duplex scan.   I discussed in depth with the patient the nature of atherosclerosis, and emphasized the importance of maximal medical management including strict control of blood pressure, blood glucose, and lipid levels, obtaining regular exercise, and continued cessation of smoking.  The patient is aware that without maximal medical management the underlying atherosclerotic disease process will progress, limiting the benefit of any interventions. The patient was given information about stroke prevention and what symptoms should prompt the patient to seek immediate medical care. Thank you for allowing [place] to participate in this patient's care.  Clemon Chambers, RN, MSN, FNP-C [facility] Office: [phone]  Clinic Physician: Oneida Alar  04/25/17 8:51 AM

## 2017-07-24 ENCOUNTER — Ambulatory Visit: Payer: Medicare Other | Admitting: Family Medicine

## 2017-08-01 ENCOUNTER — Encounter: Payer: Self-pay | Admitting: Family Medicine

## 2017-08-01 ENCOUNTER — Ambulatory Visit (INDEPENDENT_AMBULATORY_CARE_PROVIDER_SITE_OTHER): Payer: Medicare Other | Admitting: Family Medicine

## 2017-08-01 VITALS — BP 130/88 | HR 49 | Temp 97.7°F | Ht 66.0 in | Wt 144.4 lb

## 2017-08-01 DIAGNOSIS — I10 Essential (primary) hypertension: Secondary | ICD-10-CM | POA: Diagnosis not present

## 2017-08-01 DIAGNOSIS — I6521 Occlusion and stenosis of right carotid artery: Secondary | ICD-10-CM

## 2017-08-01 DIAGNOSIS — E785 Hyperlipidemia, unspecified: Secondary | ICD-10-CM

## 2017-08-01 LAB — CBC WITH DIFFERENTIAL/PLATELET
BASOS ABS: 0 10*3/uL (ref 0.0–0.1)
BASOS PCT: 0.6 % (ref 0.0–3.0)
EOS ABS: 0.2 10*3/uL (ref 0.0–0.7)
Eosinophils Relative: 4.9 % (ref 0.0–5.0)
HEMATOCRIT: 41.7 % (ref 39.0–52.0)
Hemoglobin: 13.9 g/dL (ref 13.0–17.0)
LYMPHS ABS: 0.9 10*3/uL (ref 0.7–4.0)
LYMPHS PCT: 26 % (ref 12.0–46.0)
MCHC: 33.2 g/dL (ref 30.0–36.0)
MCV: 96.4 fl (ref 78.0–100.0)
MONO ABS: 0.4 10*3/uL (ref 0.1–1.0)
Monocytes Relative: 11.2 % (ref 3.0–12.0)
NEUTROS ABS: 1.9 10*3/uL (ref 1.4–7.7)
NEUTROS PCT: 57.3 % (ref 43.0–77.0)
Platelets: 225 10*3/uL (ref 150.0–400.0)
RBC: 4.33 Mil/uL (ref 4.22–5.81)
RDW: 13.7 % (ref 11.5–15.5)
WBC: 3.3 10*3/uL — ABNORMAL LOW (ref 4.0–10.5)

## 2017-08-01 LAB — LDL CHOLESTEROL, DIRECT: LDL DIRECT: 50 mg/dL

## 2017-08-01 MED ORDER — ATORVASTATIN CALCIUM 20 MG PO TABS: 20.0000 mg | ORAL_TABLET | ORAL | 3 refills | Status: DC

## 2017-08-01 NOTE — Assessment & Plan Note (Signed)
S: well controlled on atorvastatin 20mg  every other day Lab Results  Component Value Date   CHOL 107 01/10/2017   HDL 60.40 01/10/2017   LDLCALC 39 01/10/2017   LDLDIRECT 42.0 07/30/2016   TRIG 38.0 01/10/2017   CHOLHDL 2 01/10/2017   A/P: continue current rx

## 2017-08-01 NOTE — Assessment & Plan Note (Signed)
S: controlled on amlodipine 2.5mg  here and on home readings with average 130s over 80s with many numbers in 486O and for diastolic many in 82O as well.  BP Readings from Last 3 Encounters:  08/01/17 130/88  04/25/17 (!) 148/81  01/10/17 (!) 144/90  A/P: We discussed blood pressure goal of <140/90. Continue current meds:  Thrilled he is at goal with home and in office readings at present

## 2017-08-01 NOTE — Patient Instructions (Addendum)
Please stop by lab before you go  No changes today  Blood pressure looks great at home- thanks for bringing those #s in  5-7 months follow up  Could schedule your wellness visit on 01/10/18 or later and see me the same day

## 2017-08-01 NOTE — Progress Notes (Signed)
Subjective:  Ricky Tutterow. is a 72 y.o. year old very pleasant male patient who presents for/with See problem oriented charting ROS- No chest pain or shortness of breath. No headache or blurry vision.    Past Medical History-  Patient Active Problem List   Diagnosis Date Noted  . Hemiparesis affecting dominant side as late effect of stroke ([place]) 03/08/2016    Priority: High  . History of CVA (cerebrovascular accident) 02/28/2016    Priority: High  . Carotid artery stenosis, symptomatic, right 03/19/2016    Priority: Medium  . Hyperlipidemia 03/08/2016    Priority: Medium  . Essential hypertension 12/20/2014    Priority: Medium  . Trigger middle finger of left hand 11/12/2016    Priority: Low  . OA (osteoarthritis) of knee 12/14/2013    Priority: Low  . Leukopenia 01/08/2017    Medications- reviewed and updated Current Outpatient Medications  Medication Sig Dispense Refill  . acetaminophen (TYLENOL) 500 MG tablet Take 500-1,000 mg by mouth every 6 (six) hours as needed (for pain.).     Marland Kitchen amLODipine (NORVASC) 2.5 MG tablet Take 1 tablet (2.5 mg total) by mouth daily. 90 tablet 3  . aspirin EC 325 MG tablet Take 1 tablet (325 mg total) by mouth daily. 30 tablet 0  . atorvastatin (LIPITOR) 20 MG tablet Take 1 tablet (20 mg total) by mouth daily at 6 PM. (Patient taking differently: Take 20 mg by mouth every other day. ) 90 tablet 3  . Multiple Vitamin (MULTIVITAMIN WITH MINERALS) TABS tablet Take 1 tablet by mouth daily.     No current facility-administered medications for this visit.     Objective: BP 130/88 (BP Location: Left Arm, Patient Position: Sitting, Cuff Size: Large)   Pulse (!) 49   Temp 97.7 F (36.5 C) (Oral)   Ht 5\' 6"  (1.676 m)   Wt 144 lb 6.4 oz (65.5 kg)   SpO2 95%   BMI 23.31 kg/m  Gen: NAD, resting comfortably CV: RRR no murmurs rubs or gallops Lungs: CTAB no crackles, wheeze, rhonchi Abdomen: soft/nontender/nondistended/normal bowel sounds. No  rebound or guarding.  Ext: no edema Skin: warm, dry Neuro: grossly normal, moves all extremities  Assessment/Plan:  Essential hypertension S: controlled on amlodipine 2.5mg  here and on home readings with average 130s over 80s with many numbers in 767H and for diastolic many in 41P as well.  BP Readings from Last 3 Encounters:  08/01/17 130/88  04/25/17 (!) 148/81  01/10/17 (!) 144/90  A/P: We discussed blood pressure goal of <140/90. Continue current meds:  Thrilled he is at goal with home and in office readings at present  Hyperlipidemia S: well controlled on atorvastatin 20mg  every other day Lab Results  Component Value Date   CHOL 107 01/10/2017   HDL 60.40 01/10/2017   LDLCALC 39 01/10/2017   LDLDIRECT 42.0 07/30/2016   TRIG 38.0 01/10/2017   CHOLHDL 2 01/10/2017   A/P: continue current rx   Future Appointments  Date Time Provider [place]  01/10/2018  9:00 AM Marin Olp, MD [facility]   Return in about 6 months (around 01/31/2018).  Lab/Order associations: Hyperlipidemia, unspecified hyperlipidemia type - Plan: CBC with Differential/Platelet, LDL cholesterol, direct  Essential hypertension  Meds ordered this encounter  Medications  . atorvastatin (LIPITOR) 20 MG tablet    Sig: Take 1 tablet (20 mg total) by mouth every other day.    Dispense:  46 tablet    Refill:  3    Return precautions  advised.  Garret Reddish, MD

## 2017-08-20 DIAGNOSIS — H25813 Combined forms of age-related cataract, bilateral: Secondary | ICD-10-CM | POA: Diagnosis not present

## 2017-08-20 DIAGNOSIS — H40003 Preglaucoma, unspecified, bilateral: Secondary | ICD-10-CM | POA: Diagnosis not present

## 2017-12-04 ENCOUNTER — Ambulatory Visit: Payer: Medicare Other

## 2017-12-10 ENCOUNTER — Ambulatory Visit (INDEPENDENT_AMBULATORY_CARE_PROVIDER_SITE_OTHER): Payer: Medicare Other

## 2017-12-10 DIAGNOSIS — Z23 Encounter for immunization: Secondary | ICD-10-CM | POA: Diagnosis not present

## 2017-12-24 DIAGNOSIS — H25813 Combined forms of age-related cataract, bilateral: Secondary | ICD-10-CM | POA: Diagnosis not present

## 2017-12-24 DIAGNOSIS — H40003 Preglaucoma, unspecified, bilateral: Secondary | ICD-10-CM | POA: Diagnosis not present

## 2017-12-26 ENCOUNTER — Other Ambulatory Visit: Payer: Self-pay

## 2018-01-10 ENCOUNTER — Ambulatory Visit (INDEPENDENT_AMBULATORY_CARE_PROVIDER_SITE_OTHER): Payer: Medicare Other | Admitting: Family Medicine

## 2018-01-10 ENCOUNTER — Encounter: Payer: Self-pay | Admitting: Family Medicine

## 2018-01-10 VITALS — BP 126/80 | HR 51 | Temp 97.8°F | Ht 66.0 in | Wt 146.0 lb

## 2018-01-10 DIAGNOSIS — I6521 Occlusion and stenosis of right carotid artery: Secondary | ICD-10-CM | POA: Diagnosis not present

## 2018-01-10 DIAGNOSIS — D72819 Decreased white blood cell count, unspecified: Secondary | ICD-10-CM | POA: Diagnosis not present

## 2018-01-10 DIAGNOSIS — Z1283 Encounter for screening for malignant neoplasm of skin: Secondary | ICD-10-CM | POA: Diagnosis not present

## 2018-01-10 DIAGNOSIS — E785 Hyperlipidemia, unspecified: Secondary | ICD-10-CM | POA: Diagnosis not present

## 2018-01-10 DIAGNOSIS — I10 Essential (primary) hypertension: Secondary | ICD-10-CM | POA: Diagnosis not present

## 2018-01-10 DIAGNOSIS — I69359 Hemiplegia and hemiparesis following cerebral infarction affecting unspecified side: Secondary | ICD-10-CM

## 2018-01-10 LAB — CBC WITH DIFFERENTIAL/PLATELET
BASOS ABS: 0 10*3/uL (ref 0.0–0.1)
Basophils Relative: 0.5 % (ref 0.0–3.0)
EOS ABS: 0.2 10*3/uL (ref 0.0–0.7)
Eosinophils Relative: 5.8 % — ABNORMAL HIGH (ref 0.0–5.0)
HCT: 40.8 % (ref 39.0–52.0)
Hemoglobin: 13.7 g/dL (ref 13.0–17.0)
LYMPHS ABS: 0.8 10*3/uL (ref 0.7–4.0)
Lymphocytes Relative: 24.3 % (ref 12.0–46.0)
MCHC: 33.5 g/dL (ref 30.0–36.0)
MCV: 96.8 fl (ref 78.0–100.0)
MONO ABS: 0.4 10*3/uL (ref 0.1–1.0)
MONOS PCT: 12.3 % — AB (ref 3.0–12.0)
NEUTROS PCT: 57.1 % (ref 43.0–77.0)
Neutro Abs: 1.9 10*3/uL (ref 1.4–7.7)
Platelets: 224 10*3/uL (ref 150.0–400.0)
RBC: 4.22 Mil/uL (ref 4.22–5.81)
RDW: 14 % (ref 11.5–15.5)
WBC: 3.4 10*3/uL — ABNORMAL LOW (ref 4.0–10.5)

## 2018-01-10 LAB — COMPREHENSIVE METABOLIC PANEL
ALK PHOS: 51 U/L (ref 39–117)
ALT: 22 U/L (ref 0–53)
AST: 30 U/L (ref 0–37)
Albumin: 4 g/dL (ref 3.5–5.2)
BILIRUBIN TOTAL: 0.7 mg/dL (ref 0.2–1.2)
BUN: 18 mg/dL (ref 6–23)
CO2: 29 meq/L (ref 19–32)
CREATININE: 0.85 mg/dL (ref 0.40–1.50)
Calcium: 9.2 mg/dL (ref 8.4–10.5)
Chloride: 104 mEq/L (ref 96–112)
GFR: 93.62 mL/min (ref >60.00)
GLUCOSE: 101 mg/dL — AB (ref 70–99)
Potassium: 4.2 mEq/L (ref 3.5–5.1)
SODIUM: 139 meq/L (ref 135–145)
TOTAL PROTEIN: 6.3 g/dL (ref 6.0–8.3)

## 2018-01-10 LAB — LIPID PANEL
Cholesterol: 110 mg/dL (ref 0–200)
HDL: 65.6 mg/dL (ref >39.00)
LDL Cholesterol: 39 mg/dL (ref 0–99)
NONHDL: 44.76
Total CHOL/HDL Ratio: 2
Triglycerides: 29 mg/dL (ref 0.0–149.0)
VLDL: 5.8 mg/dL (ref 0.0–40.0)

## 2018-01-10 NOTE — Assessment & Plan Note (Signed)
S: Mild leukopenia with last WBC of 3.3-all other cell lines were normal and differential had normal distribution.  Patient does not feel ill.  Low risk for HIV. A/P: Intermittent issue since at least 2015-we will continue to monitor to make sure other cell lines stable-consider hematology referral if worsens

## 2018-01-10 NOTE — Assessment & Plan Note (Signed)
S:Patient still with hemiparesis of left side from prior stroke-related to carotid artery stenosis-status post right endarterectomy. For example when at the gym he will do the same weight but doesn't feel the same in his hand. Sometimes puts hand in pocket and cant tell whats in there.  Continues to try to practice more with this side.  A/P: stable, continue to monitor

## 2018-01-10 NOTE — Progress Notes (Signed)
Subjective:  Ricky Abel. is a 72 y.o. year old very pleasant male patient who presents for/with See problem oriented charting ROS- left sided sensation is not as prominent as right. no chest pain or shortness of breath. No headache or blurry vision.    Past Medical History-  Patient Active Problem List   Diagnosis Date Noted  . Hemiparesis affecting dominant side as late effect of stroke ([place]) 03/08/2016    Priority: High  . History of CVA (cerebrovascular accident) 02/28/2016    Priority: High  . Carotid artery stenosis, symptomatic, right 03/19/2016    Priority: Medium  . Hyperlipidemia 03/08/2016    Priority: Medium  . Essential hypertension 12/20/2014    Priority: Medium  . Trigger middle finger of left hand 11/12/2016    Priority: Low  . OA (osteoarthritis) of knee 12/14/2013    Priority: Low  . Leukopenia 01/08/2017    Medications- reviewed and updated Current Outpatient Medications  Medication Sig Dispense Refill  . acetaminophen (TYLENOL) 500 MG tablet Take 500-1,000 mg by mouth every 6 (six) hours as needed (for pain.).     Marland Kitchen amLODipine (NORVASC) 2.5 MG tablet Take 1 tablet (2.5 mg total) by mouth daily. 90 tablet 3  . aspirin EC 325 MG tablet Take 1 tablet (325 mg total) by mouth daily. 30 tablet 0  . atorvastatin (LIPITOR) 20 MG tablet Take 1 tablet (20 mg total) by mouth every other day. 46 tablet 3  . Multiple Vitamin (MULTIVITAMIN WITH MINERALS) TABS tablet Take 1 tablet by mouth daily.     No current facility-administered medications for this visit.     Objective: BP 126/80 (BP Location: Left Arm, Patient Position: Sitting, Cuff Size: Large)   Pulse (!) 51   Temp 97.8 F (36.6 C) (Oral)   Ht 5\' 6"  (1.676 m)   Wt 146 lb (66.2 kg)   SpO2 95%   BMI 23.57 kg/m  Gen: NAD, resting comfortably CV: RRR no murmurs rubs or gallops Lungs: CTAB no crackles, wheeze, rhonchi Abdomen: soft/nontender/nondistended/normal bowel sounds. No rebound or guarding.   Ext: no edema Skin: warm, dry Neuro: Left hand with decreased sensation compared to right with monofilament, moves all extremities, speech normal  Assessment/Plan:  Essential hypertension S: controlled on amlodipine 2.5 mg.  Home numbers continue to look excellent as well around 130/80. Occasionally gets into low 140s.   BP Readings from Last 3 Encounters:  01/10/18 126/80  08/01/17 130/88  04/25/17 (!) 148/81  A/P: We discussed blood pressure goal of <140/90. Continue current meds  Hyperlipidemia S:  controlled on atorvastatin 20 mg every other day with last LDL under 70 Lab Results  Component Value Date   CHOL 107 01/10/2017   HDL 60.40 01/10/2017   LDLCALC 39 01/10/2017   LDLDIRECT 50.0 08/01/2017   TRIG 38.0 01/10/2017   CHOLHDL 2 01/10/2017   A/P: Stable- update lipid panel today  Leukopenia S: Mild leukopenia with last WBC of 3.3-all other cell lines were normal and differential had normal distribution.  Patient does not feel ill.  Low risk for HIV. A/P: Intermittent issue since at least 2015-we will continue to monitor to make sure other cell lines stable-consider hematology referral if worsens  Hemiparesis affecting dominant side as late effect of stroke [facility]) S:Patient still with hemiparesis of left side from prior stroke-related to carotid artery stenosis-status post right endarterectomy. For example when at the gym he will do the same weight but doesn't feel the same in his hand. Sometimes puts  hand in pocket and cant tell whats in there.  Continues to try to practice more with this side.  A/P: stable, continue to monitor   Return in about 6 months (around 07/12/2018) for follow up- or sooner if needed. Will need AWV when our availability improves  Lab/Order associations: half and half in coffee only  Hyperlipidemia, unspecified hyperlipidemia type - Plan: CBC with Differential/Platelet, Comprehensive metabolic panel, Lipid panel  Essential  hypertension  Leukopenia, unspecified type  Hemiparesis affecting dominant side as late effect of stroke [facility])  Skin cancer screening - Plan: Ambulatory referral to Dermatology Patient requests referral for general skin cancer screening  Return precautions advised.  Garret Reddish, MD

## 2018-01-10 NOTE — Patient Instructions (Addendum)
Please stop by lab before you go  6 months with me and can schedule wellness visit with nurse same day hopefully   We will call you within two weeks about your referral to dermatology. If you do not hear within 3 weeks, give [place] a call.

## 2018-01-10 NOTE — Assessment & Plan Note (Signed)
S:  controlled on atorvastatin 20 mg every other day with last LDL under 70 Lab Results  Component Value Date   CHOL 107 01/10/2017   HDL 60.40 01/10/2017   LDLCALC 39 01/10/2017   LDLDIRECT 50.0 08/01/2017   TRIG 38.0 01/10/2017   CHOLHDL 2 01/10/2017   A/P: Stable- update lipid panel today

## 2018-01-10 NOTE — Assessment & Plan Note (Signed)
S: controlled on amlodipine 2.5 mg.  Home numbers continue to look excellent as well around 130/80. Occasionally gets into low 140s.   BP Readings from Last 3 Encounters:  01/10/18 126/80  08/01/17 130/88  04/25/17 (!) 148/81  A/P: We discussed blood pressure goal of <140/90. Continue current meds

## 2018-01-22 ENCOUNTER — Other Ambulatory Visit: Payer: Self-pay

## 2018-01-22 MED ORDER — AMLODIPINE BESYLATE 2.5 MG PO TABS: 2.5000 mg | ORAL_TABLET | Freq: Every day | ORAL | 3 refills | Status: DC

## 2018-04-14 DIAGNOSIS — D229 Melanocytic nevi, unspecified: Secondary | ICD-10-CM | POA: Diagnosis not present

## 2018-04-14 DIAGNOSIS — L819 Disorder of pigmentation, unspecified: Secondary | ICD-10-CM | POA: Diagnosis not present

## 2018-04-14 DIAGNOSIS — C44619 Basal cell carcinoma of skin of left upper limb, including shoulder: Secondary | ICD-10-CM | POA: Diagnosis not present

## 2018-04-14 DIAGNOSIS — L570 Actinic keratosis: Secondary | ICD-10-CM | POA: Diagnosis not present

## 2018-04-14 DIAGNOSIS — C44519 Basal cell carcinoma of skin of other part of trunk: Secondary | ICD-10-CM | POA: Diagnosis not present

## 2018-04-14 DIAGNOSIS — L821 Other seborrheic keratosis: Secondary | ICD-10-CM | POA: Diagnosis not present

## 2018-04-14 DIAGNOSIS — L814 Other melanin hyperpigmentation: Secondary | ICD-10-CM | POA: Diagnosis not present

## 2018-06-19 ENCOUNTER — Ambulatory Visit: Payer: Medicare Other | Admitting: Family

## 2018-06-19 ENCOUNTER — Encounter (HOSPITAL_COMMUNITY): Payer: Medicare Other

## 2018-07-18 ENCOUNTER — Other Ambulatory Visit: Payer: Self-pay

## 2018-07-18 DIAGNOSIS — I6523 Occlusion and stenosis of bilateral carotid arteries: Secondary | ICD-10-CM

## 2018-07-22 ENCOUNTER — Other Ambulatory Visit: Payer: Self-pay

## 2018-07-22 ENCOUNTER — Telehealth: Payer: Self-pay | Admitting: *Deleted

## 2018-07-22 ENCOUNTER — Ambulatory Visit (HOSPITAL_COMMUNITY)
Admission: RE | Admit: 2018-07-22 | Discharge: 2018-07-22 | Disposition: A | Discharge status: Home / Self Care | Payer: Medicare Other | Source: Ambulatory Visit | Attending: Family | Admitting: Family

## 2018-07-22 DIAGNOSIS — I6523 Occlusion and stenosis of bilateral carotid arteries: Secondary | ICD-10-CM | POA: Diagnosis not present

## 2018-07-23 ENCOUNTER — Ambulatory Visit: Payer: Medicare Other | Admitting: Family Medicine

## 2018-07-25 ENCOUNTER — Encounter: Payer: Medicare Other | Admitting: Family

## 2018-07-25 ENCOUNTER — Encounter: Payer: Self-pay | Admitting: Family

## 2018-07-25 ENCOUNTER — Other Ambulatory Visit: Payer: Self-pay

## 2018-07-25 VITALS — BP 130/72 | HR 58 | Temp 98.1°F | Ht 65.0 in | Wt 137.4 lb

## 2018-07-25 DIAGNOSIS — I6523 Occlusion and stenosis of bilateral carotid arteries: Secondary | ICD-10-CM

## 2018-07-25 NOTE — Progress Notes (Signed)
A video visit was scheduled. I connected with pt by phone for less than a minute until the connection was gone. I called the same phone number, voicemail answered, I left a message. I tried again to connect by phone, voicemail message answered. Will need to reschedule visit.

## 2018-07-29 ENCOUNTER — Ambulatory Visit (INDEPENDENT_AMBULATORY_CARE_PROVIDER_SITE_OTHER): Payer: Medicare Other | Admitting: Family Medicine

## 2018-07-29 ENCOUNTER — Ambulatory Visit: Payer: Medicare Other

## 2018-07-29 ENCOUNTER — Encounter: Payer: Self-pay | Admitting: Family Medicine

## 2018-07-29 ENCOUNTER — Other Ambulatory Visit: Payer: Self-pay

## 2018-07-29 VITALS — BP 124/86 | HR 53 | Temp 97.8°F | Ht 65.0 in | Wt 142.2 lb

## 2018-07-29 DIAGNOSIS — M65332 Trigger finger, left middle finger: Secondary | ICD-10-CM

## 2018-07-29 DIAGNOSIS — Z8673 Personal history of transient ischemic attack (TIA), and cerebral infarction without residual deficits: Secondary | ICD-10-CM

## 2018-07-29 DIAGNOSIS — I6523 Occlusion and stenosis of bilateral carotid arteries: Secondary | ICD-10-CM | POA: Diagnosis not present

## 2018-07-29 DIAGNOSIS — E785 Hyperlipidemia, unspecified: Secondary | ICD-10-CM

## 2018-07-29 DIAGNOSIS — I69359 Hemiplegia and hemiparesis following cerebral infarction affecting unspecified side: Secondary | ICD-10-CM

## 2018-07-29 DIAGNOSIS — I10 Essential (primary) hypertension: Secondary | ICD-10-CM

## 2018-07-29 NOTE — Progress Notes (Signed)
Phone [phone]   Subjective:  Shashank Kwasnik. is a 73 y.o. year old very pleasant male patient who presents for/with See problem oriented charting Chief Complaint  Patient presents with  . Hypertension   ROS- No fever, chills, cough, congestion, runny nose, shortness of breath, fatigue, body aches, sore throat, headache, nausea, vomiting, diarrhea, or new loss of taste or smell. No known contacts with covid 19 or someone being tested for covid 19.   Past Medical History-  Patient Active Problem List   Diagnosis Date Noted  . Hemiparesis affecting dominant side as late effect of stroke ([place]) 03/08/2016    Priority: High  . History of CVA (cerebrovascular accident) 02/28/2016    Priority: High  . Carotid artery stenosis, symptomatic, right 03/19/2016    Priority: Medium  . Hyperlipidemia 03/08/2016    Priority: Medium  . Essential hypertension 12/20/2014    Priority: Medium  . Trigger middle finger of left hand 11/12/2016    Priority: Low  . OA (osteoarthritis) of knee 12/14/2013    Priority: Low  . Leukopenia 01/08/2017    Medications- reviewed and updated Current Outpatient Medications  Medication Sig Dispense Refill  . acetaminophen (TYLENOL) 500 MG tablet Take 500-1,000 mg by mouth every 6 (six) hours as needed (for pain.).     Marland Kitchen amLODipine (NORVASC) 2.5 MG tablet Take 1 tablet (2.5 mg total) by mouth daily. 90 tablet 3  . aspirin EC 325 MG tablet Take 1 tablet (325 mg total) by mouth daily. 30 tablet 0  . atorvastatin (LIPITOR) 20 MG tablet Take 1 tablet (20 mg total) by mouth every other day. 46 tablet 3  . Multiple Vitamin (MULTIVITAMIN WITH MINERALS) TABS tablet Take 1 tablet by mouth daily.     No current facility-administered medications for this visit.      Objective:  BP 124/86 (BP Location: Left Arm, Cuff Size: Normal)   Pulse (!) 53   Temp 97.8 F (36.6 C) (Oral)   Ht 5\' 5"  (1.651 m)   Wt 142 lb 3.2 oz (64.5 kg)   SpO2 99%   BMI 23.66 kg/m   Gen: NAD, resting comfortably, athletic build for age CV: RRR no murmurs rubs or gallops Lungs: CTAB no crackles, wheeze, rhonchi Abdomen: soft/nontender/nondistended/normal bowel sounds. No rebound or guarding.  Ext: no edema Skin: warm, dry Neuro: grossly normal, moves all extremities Msk: mild catch/triggering on left 3rd finger    Assessment and Plan   #hypertension S: controlled on amlodipine 2.5 mg BP Readings from Last 3 Encounters:  07/29/18 124/86  07/25/18 130/72  01/10/18 126/80  A/P: Stable. Continue current medications.   #hyperlipidemia/left hemiparesis S: well controlled on atorvastaitn 20mg  every other day  History of stroke- Compliant with aspirin. No chest pain or shortness of breath- does some workouts with weights going up stairs and that makes him somewhat winded. Works out 45 mins to an hour. Still fumbles with some things in left hand- he is left sided- tries not to switch to right hand unless he has to.   Right carotid artery with no stenosis after carotid endarterectomy-which was cause of stroke. Left side 1-39%.  Lab Results  Component Value Date   CHOL 110 01/10/2018   HDL 65.60 01/10/2018   LDLCALC 39 01/10/2018   LDLDIRECT 50.0 08/01/2017   TRIG 29.0 01/10/2018   CHOLHDL 2 01/10/2018   A/P: Stable. Continue current medications.   #trigger finger left 3rd finger- old brace broke- seems to be helpful- new brace given  6 months with full bloodwork at that time.  Lab/Order associations:   ICD-10-CM   1. Essential hypertension  I10   2. Hyperlipidemia, unspecified hyperlipidemia type  E78.5   3. Hemiparesis affecting dominant side as late effect of stroke ([facility]) Chronic I69.359   4. Trigger middle finger of left hand  M65.332   5. History of CVA (cerebrovascular accident)  Z86.73     Return precautions advised.  Garret Reddish, MD

## 2018-07-29 NOTE — Patient Instructions (Addendum)
Keep up the great job with exercise!   Labs next time. No changes today   Great to see you today!

## 2018-09-05 ENCOUNTER — Other Ambulatory Visit: Payer: Self-pay | Admitting: Family Medicine

## 2018-09-22 ENCOUNTER — Ambulatory Visit (INDEPENDENT_AMBULATORY_CARE_PROVIDER_SITE_OTHER): Payer: Medicare Other

## 2018-09-22 ENCOUNTER — Other Ambulatory Visit: Payer: Self-pay

## 2018-09-22 VITALS — BP 130/74 | Temp 97.2°F | Ht 65.0 in | Wt 141.4 lb

## 2018-09-22 DIAGNOSIS — Z0000 Encounter for general adult medical examination without abnormal findings: Secondary | ICD-10-CM | POA: Diagnosis not present

## 2018-09-22 NOTE — Progress Notes (Signed)
I have reviewed and agree with note, evaluation, plan.   Lakeith Careaga, MD  

## 2018-09-22 NOTE — Patient Instructions (Signed)
Ricky [name] , Thank you for taking time to come for your Medicare Wellness Visit. I appreciate your ongoing commitment to your health goals. Please review the following plan we discussed and let me know if I can assist you in the future.   Screening recommendations/referrals: Colorectal Screening: up to date   Vision and Dental Exams: Recommended annual ophthalmology exams for early detection of glaucoma and other disorders of the eye Recommended annual dental exams for proper oral hygiene   Vaccinations: Influenza vaccine:  recommended this fall either at PCP office or through your local pharmacy  Pneumococcal vaccine: up to date  Tdap vaccine: 04/20/14 Shingles vaccine: Please call your insurance company to determine your out of pocket expense for the Shingrix vaccine. You may receive this vaccine at your local pharmacy.  Advanced directives: Please bring a copy of your POA (Power of Attorney) and/or Living Will to your next appointment.  Goals: Recommend to exercise for at least 150 minutes per week.  Next appointment: Please schedule your Annual Wellness Visit with your Nurse Health Advisor in one year.  Preventive Care 37 Years and Older, Male Preventive care refers to lifestyle choices and visits with your health care provider that can promote health and wellness. What does preventive care include?  A yearly physical exam. This is also called an annual well check.  Dental exams once or twice a year.  Routine eye exams. Ask your health care provider how often you should have your eyes checked.  Personal lifestyle choices, including:  Daily care of your teeth and gums.  Regular physical activity.  Eating a healthy diet.  Avoiding tobacco and drug use.  Limiting alcohol use.  Practicing safe sex.  Taking low doses of aspirin every day if recommended by your health care provider..  Taking vitamin and mineral supplements as recommended by your health care provider.  What happens during an annual well check? The services and screenings done by your health care provider during your annual well check will depend on your age, overall health, lifestyle risk factors, and family history of disease. Counseling  Your health care provider may ask you questions about your:  Alcohol use.  Tobacco use.  Drug use.  Emotional well-being.  Home and relationship well-being.  Sexual activity.  Eating habits.  History of falls.  Memory and ability to understand (cognition).  Work and work Statistician. Screening  You may have the following tests or measurements:  Height, weight, and BMI.  Blood pressure.  Lipid and cholesterol levels. These may be checked every 5 years, or more frequently if you are over 50 years old.  Skin check.  Lung cancer screening. You may have this screening every year starting at age 22 if you have a 30-pack-year history of smoking and currently smoke or have quit within the past 15 years.  Fecal occult blood test (FOBT) of the stool. You may have this test every year starting at age 85.  Flexible sigmoidoscopy or colonoscopy. You may have a sigmoidoscopy every 5 years or a colonoscopy every 10 years starting at age 56.  Prostate cancer screening. Recommendations will vary depending on your family history and other risks.  Hepatitis C blood test.  Hepatitis B blood test.  Sexually transmitted disease (STD) testing.  Diabetes screening. This is done by checking your blood sugar (glucose) after you have not eaten for a while (fasting). You may have this done every 1-3 years.  Abdominal aortic aneurysm (AAA) screening. You may need this if you  are a current or former smoker.  Osteoporosis. You may be screened starting at age 74 if you are at high risk. Talk with your health care provider about your test results, treatment options, and if necessary, the need for more tests. Vaccines  Your health care provider may  recommend certain vaccines, such as:  Influenza vaccine. This is recommended every year.  Tetanus, diphtheria, and acellular pertussis (Tdap, Td) vaccine. You may need a Td booster every 10 years.  Zoster vaccine. You may need this after age 38.  Pneumococcal 13-valent conjugate (PCV13) vaccine. One dose is recommended after age 10.  Pneumococcal polysaccharide (PPSV23) vaccine. One dose is recommended after age 32. Talk to your health care provider about which screenings and vaccines you need and how often you need them. This information is not intended to replace advice given to you by your health care provider. Make sure you discuss any questions you have with your health care provider. Document Released: 02/18/2015 Document Revised: 10/12/2015 Document Reviewed: 11/23/2014 Elsevier Interactive Patient Education  2017 [place] Prevention in the Home Falls can cause injuries. They can happen to people of all ages. There are many things you can do to make your home safe and to help prevent falls. What can I do on the outside of my home?  Regularly fix the edges of walkways and driveways and fix any cracks.  Remove anything that might make you trip as you walk through a door, such as a raised step or threshold.  Trim any bushes or trees on the path to your home.  Use bright outdoor lighting.  Clear any walking paths of anything that might make someone trip, such as rocks or tools.  Regularly check to see if handrails are loose or broken. Make sure that both sides of any steps have handrails.  Any raised decks and porches should have guardrails on the edges.  Have any leaves, snow, or ice cleared regularly.  Use sand or salt on walking paths during winter.  Clean up any spills in your garage right away. This includes oil or grease spills. What can I do in the bathroom?  Use night lights.  Install grab bars by the toilet and in the tub and shower. Do not use towel  bars as grab bars.  Use non-skid mats or decals in the tub or shower.  If you need to sit down in the shower, use a plastic, non-slip stool.  Keep the floor dry. Clean up any water that spills on the floor as soon as it happens.  Remove soap buildup in the tub or shower regularly.  Attach bath mats securely with double-sided non-slip rug tape.  Do not have throw rugs and other things on the floor that can make you trip. What can I do in the bedroom?  Use night lights.  Make sure that you have a light by your bed that is easy to reach.  Do not use any sheets or blankets that are too big for your bed. They should not hang down onto the floor.  Have a firm chair that has side arms. You can use this for support while you get dressed.  Do not have throw rugs and other things on the floor that can make you trip. What can I do in the kitchen?  Clean up any spills right away.  Avoid walking on wet floors.  Keep items that you use a lot in easy-to-reach places.  If you need to reach  something above you, use a strong step stool that has a grab bar.  Keep electrical cords out of the way.  Do not use floor polish or wax that makes floors slippery. If you must use wax, use non-skid floor wax.  Do not have throw rugs and other things on the floor that can make you trip. What can I do with my stairs?  Do not leave any items on the stairs.  Make sure that there are handrails on both sides of the stairs and use them. Fix handrails that are broken or loose. Make sure that handrails are as long as the stairways.  Check any carpeting to make sure that it is firmly attached to the stairs. Fix any carpet that is loose or worn.  Avoid having throw rugs at the top or bottom of the stairs. If you do have throw rugs, attach them to the floor with carpet tape.  Make sure that you have a light switch at the top of the stairs and the bottom of the stairs. If you do not have them, ask someone to  add them for you. What else can I do to help prevent falls?  Wear shoes that:  Do not have high heels.  Have rubber bottoms.  Are comfortable and fit you well.  Are closed at the toe. Do not wear sandals.  If you use a stepladder:  Make sure that it is fully opened. Do not climb a closed stepladder.  Make sure that both sides of the stepladder are locked into place.  Ask someone to hold it for you, if possible.  Clearly mark and make sure that you can see:  Any grab bars or handrails.  First and last steps.  Where the edge of each step is.  Use tools that help you move around (mobility aids) if they are needed. These include:  Canes.  Walkers.  Scooters.  Crutches.  Turn on the lights when you go into a dark area. Replace any light bulbs as soon as they burn out.  Set up your furniture so you have a clear path. Avoid moving your furniture around.  If any of your floors are uneven, fix them.  If there are any pets around you, be aware of where they are.  Review your medicines with your doctor. Some medicines can make you feel dizzy. This can increase your chance of falling. Ask your doctor what other things that you can do to help prevent falls. This information is not intended to replace advice given to you by your health care provider. Make sure you discuss any questions you have with your health care provider. Document Released: 11/18/2008 Document Revised: 06/30/2015 Document Reviewed: 02/26/2014 Elsevier Interactive Patient Education  2017 [employer].

## 2018-09-22 NOTE — Progress Notes (Signed)
Subjective:   Ricky [name]. is a 73 y.o. male who presents for Medicare Annual/Subsequent preventive examination.  Review of Systems:   Cardiac Risk Factors include: advanced age (>27men, >97 women);hypertension;dyslipidemia     Objective:    Vitals: BP 130/74   Temp (!) 97.2 F (36.2 C)   Ht 5\' 5"  (1.651 m)   Wt 141 lb 6.4 oz (64.1 kg)   BMI 23.53 kg/m   Body mass index is 23.53 kg/m.  Advanced Directives 09/22/2018 04/25/2017 01/10/2017 10/18/2016 03/19/2016 03/09/2016 03/05/2016  Does Patient Have a Medical Advance Directive? Yes Yes Yes Yes Yes Yes No  Type of Paramedic of [place];Living will [place];Living will - [place];Living will Living will Living will -  Does patient want to make changes to medical advance directive? No - Patient declined - - - No - Patient declined Yes (MAU/Ambulatory/Procedural Areas - Information given) -  Copy of [place] in Chart? No - copy requested No - copy requested - No - copy requested - - -  Would patient like information on creating a medical advance directive? Yes (MAU/Ambulatory/Procedural Areas - Information given) - - - No - Patient declined - No - Patient declined    Tobacco Social History   Tobacco Use  Smoking Status Never Smoker  Smokeless Tobacco Never Used     Counseling given: Not Answered   Clinical Intake:  Pre-visit preparation completed: Yes  Pain : No/denies pain  BMI - recorded: 23.53 Nutritional Status: BMI of 19-24  Normal Nutritional Risks: None Diabetes: No  How often do you need to have someone help you when you read instructions, pamphlets, or other written materials from your doctor or pharmacy?: 1 - Never  Interpreter Needed?: No  Information entered by :: Ricky George LPN  Past Medical History:  Diagnosis Date  . Arthritis   . Carotid artery occlusion   . Complication of anesthesia    slow to wake up  after shoulder surgeries  . Hyperlipidemia 03/08/2016   LDL 75 pre stroke. Goal <70 so on atorvastatin 20mg  every other day  . Hypertension   . Peripheral vascular disease ([place])   . Pneumonia   . Stroke [facility])    still has decreased sensation in left hand and arm (as of 03/09/16)   Past Surgical History:  Procedure Laterality Date  . CAROTID ENDARTERECTOMY    . ENDARTERECTOMY Right 03/19/2016   Procedure: ENDARTERECTOMY CAROTID;  Surgeon: Elam Dutch, MD;  Location: [place];  Service: Vascular;  Laterality: Right;  . PATCH ANGIOPLASTY Right 03/19/2016   Procedure: PATCH ANGIOPLASTY OF RIGHT CAROTID USING HEMASHIELD PLATINUM FINESSE;  Surgeon: Elam Dutch, MD;  Location: [place];  Service: Vascular;  Laterality: Right;  . SHOULDER SURGERY Bilateral    left- injections intermittent now  Rotator cuff repair on both shoulders, 2 different surgeries  . TONSILLECTOMY    . TOTAL KNEE ARTHROPLASTY Left 12/14/2013   Procedure: LEFT TOTAL KNEE ARTHROPLASTY;  Surgeon: Gearlean Alf, MD;  Location: [facility];  Service: Orthopedics;  Laterality: Left;   Family History  Problem Relation Age of Onset  . Heart disease Father        in 62s- specifics unclear  . Other Mother        passed in late 37s   Social History   Socioeconomic History  . Marital status: Married    Spouse name: Not on file  . Number of  children: Not on file  . Years of education: Not on file  . Highest education level: Not on file  Occupational History  . Not on file  Social Needs  . Financial resource strain: Not on file  . Food insecurity    Worry: Not on file    Inability: Not on file  . Transportation needs    Medical: Not on file    Non-medical: Not on file  Tobacco Use  . Smoking status: Never Smoker  . Smokeless tobacco: Never Used  Substance and Sexual Activity  . Alcohol use: No    Comment: former drinker  . Drug use: No  . Sexual activity: Yes  Lifestyle  . Physical activity    Days per week: Not  on file    Minutes per session: Not on file  . Stress: Not on file  Relationships  . Social Herbalist on phone: Not on file    Gets together: Not on file    Attends religious service: Not on file    Active member of club or organization: Not on file    Attends meetings of clubs or organizations: Not on file    Relationship status: Not on file  Other Topics Concern  . Not on file  Social History Narrative   Married. 1 son. 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       Hobbies: gym daily- riding exercise bike plus weights/machines, photography- used to do a lot of black and white, gardening    Outpatient Encounter Medications as of 09/22/2018  Medication Sig  . acetaminophen (TYLENOL) 500 MG tablet Take 500-1,000 mg by mouth every 6 (six) hours as needed (for pain.).   Marland Kitchen amLODipine (NORVASC) 2.5 MG tablet Take 1 tablet (2.5 mg total) by mouth daily.  Marland Kitchen aspirin EC 325 MG tablet Take 1 tablet (325 mg total) by mouth daily.  Marland Kitchen atorvastatin (LIPITOR) 20 MG tablet TAKE 1 TABLET (20 MG TOTAL) BY MOUTH EVERY OTHER DAY.  . Multiple Vitamin (MULTIVITAMIN WITH MINERALS) TABS tablet Take 1 tablet by mouth daily.   No facility-administered encounter medications on file as of 09/22/2018.     Activities of Daily Living In your present state of health, do you have any difficulty performing the following activities: 09/22/2018  Hearing? N  Vision? N  Difficulty concentrating or making decisions? N  Walking or climbing stairs? N  Dressing or bathing? N  Doing errands, shopping? N  Preparing Food and eating ? N  Using the Toilet? N  In the past six months, have you accidently leaked urine? N  Do you have problems with loss of bowel control? N  Managing your Medications? N  Managing your Finances? N  Housekeeping or managing your Housekeeping? N  Some recent data might be hidden    Patient Care Team: Marin Olp, MD as PCP - General (Family Medicine)   Assessment:   This is a routine wellness examination for [place].  Exercise Activities and Dietary recommendations Current Exercise Habits: Home exercise routine, Type of exercise: strength training/weights;Other - see comments(biking), Time (Minutes): 60, Frequency (Times/Week): 5, Weekly Exercise (Minutes/Week): 300, Intensity: Moderate  Goals    . Patient Stated     To maintain your health; keep weight in check Keep exercises        Fall Risk Fall Risk  09/22/2018 01/10/2018 12/26/2017 01/10/2017 01/10/2016  Falls in the past year?  0 0 0 No No  Comment - - Emmi Telephone Survey: data to providers prior to load - -  Number falls in past yr: 0 - - - -  Injury with Fall? 0 - - - -   Timed Get Up and Go Performed: completed and within normal limits   Depression Screen PHQ 2/9 Scores 09/22/2018 01/10/2018 01/10/2017 01/10/2016  PHQ - 2 Score 0 0 0 0    Cognitive Function MMSE - Mini Mental State Exam 01/10/2017  Not completed: (No Data)     6CIT Screen 09/22/2018  What Year? 0 points  What month? 0 points  What time? 0 points  Count back from 20 0 points  Months in reverse 0 points  Repeat phrase 0 points  Total Score 0    Immunization History  Administered Date(s) Administered  . Influenza, High Dose Seasonal PF 01/10/2016, 11/12/2016, 12/10/2017  . Influenza,inj,Quad PF,6+ Mos 11/30/2013, 12/20/2014  . Pneumococcal Conjugate-13 11/30/2013  . Pneumococcal Polysaccharide-23 12/20/2014  . Td 04/20/2014    Qualifies for Shingles Vaccine? Discussed and patient will check with pharmacy for coverage.  Patient education handout provided    Screening Tests Health Maintenance  Topic Date Due  . INFLUENZA VACCINE  09/06/2018  . Fecal DNA (Cologuard)  01/19/2019  . TETANUS/TDAP  04/19/2024  . Hepatitis C Screening  Completed  . PNA vac Low Risk Adult  Completed   Cancer Screenings: Colorectal: up to tdate- Cologuard 01/2016     Plan:    I have personally reviewed and addressed the Medicare Annual Wellness questionnaire and have noted the following in the patient's chart:  A. Medical and social history B. Use of alcohol, tobacco or illicit drugs  C. Current medications and supplements D. Functional ability and status E.  Nutritional status F.  Physical activity G. Advance directives H. List of other physicians I.  Hospitalizations, surgeries, and ER visits in previous 12 months J.  [place] such as hearing and vision if needed, cognitive and depression L. Referrals, records requested, and appointments- none   In addition, I have reviewed and discussed with patient certain preventive protocols, quality metrics, and best practice recommendations. A written personalized care plan for preventive services as well as general preventive health recommendations were provided to patient.   Signed,  Ricky George, LPN  Nurse Health Advisor   Nurse Notes: no additions

## 2018-09-26 ENCOUNTER — Encounter: Payer: Self-pay | Admitting: Family Medicine

## 2018-09-28 DIAGNOSIS — Z23 Encounter for immunization: Secondary | ICD-10-CM | POA: Diagnosis not present

## 2018-10-08 ENCOUNTER — Encounter: Payer: Self-pay | Admitting: Family

## 2018-10-08 ENCOUNTER — Ambulatory Visit (INDEPENDENT_AMBULATORY_CARE_PROVIDER_SITE_OTHER): Payer: Medicare Other | Admitting: Family

## 2018-10-08 ENCOUNTER — Other Ambulatory Visit: Payer: Self-pay

## 2018-10-08 DIAGNOSIS — I6523 Occlusion and stenosis of bilateral carotid arteries: Secondary | ICD-10-CM | POA: Diagnosis not present

## 2018-10-08 DIAGNOSIS — Z8673 Personal history of transient ischemic attack (TIA), and cerebral infarction without residual deficits: Secondary | ICD-10-CM | POA: Diagnosis not present

## 2018-10-08 DIAGNOSIS — Z9889 Other specified postprocedural states: Secondary | ICD-10-CM

## 2018-10-08 NOTE — Patient Instructions (Signed)

## 2018-10-08 NOTE — Progress Notes (Signed)
Virtual Visit via Telephone Note    I connected with Ricky [name]. on 10/08/2018 using the [employer] by telephone and verified that I was speaking with the correct person using two identifiers. Patient was located at his home and accompanied by himself. I am located at the [facility] office/clinic.   The limitations of evaluation and management by telemedicine and the availability of in person appointments have been previously discussed with the patient and are documented in the patients chart. The patient expressed understanding and consented to proceed.  PCP: Marin Olp, MD  Chief Complaint: Follow up extracranial carotid artery stenosis   History of Present Illness: Ricky [name]. is a 73 y.o. male who sustained a right brain stroke January 2018. His primary symptom was left hand clumsiness.  He underwent a right carotid endarterectomy 03/19/2016 by Dr. Oneida Alar. He has had no symptoms of TIA amaurosis or stroke since that operation.  He is on a statin and aspirin. He still has some subtle clumsiness of his left hand for example dropping his keys when he tries to put them in his car.   He denies shortness of breath. He denies chest pain.  Dr. Oneida Alar last evaluated pt on 10-18-16. At that time carotid duplex with no significant right side restenosis and less than 40% left internal carotid artery stenosis, antegrade vertebral flow. Doing well status post right carotid endarterectomy done for symptomatic right carotid stenosis and right brain stroke. Pt was to follow-up in 6 months with our nurse practitioner and have a repeat carotid duplex.He will continue his statin and aspirin.  He exercises at home by climbing stairs and using weights about 45 minutes daily, about 5x/week.   Pt states his blood pressure at home is about 130/80.   He denies any claudication type sx's with walking.   Diabetic: no Tobacco use: non-smoker  Pt meds include: Statin : yes, takes  qod ASA: yes, 325 mg daily Other anticoagulants/antiplatelets: no   Past Medical History:  Diagnosis Date  . Arthritis   . Carotid artery occlusion   . Complication of anesthesia    slow to wake up after shoulder surgeries  . Hyperlipidemia 03/08/2016   LDL 75 pre stroke. Goal <70 so on atorvastatin 20mg  every other day  . Hypertension   . Peripheral vascular disease ([place])   . Pneumonia   . Stroke [facility])    still has decreased sensation in left hand and arm (as of 03/09/16)    Past Surgical History:  Procedure Laterality Date  . CAROTID ENDARTERECTOMY    . ENDARTERECTOMY Right 03/19/2016   Procedure: ENDARTERECTOMY CAROTID;  Surgeon: Elam Dutch, MD;  Location: [place];  Service: Vascular;  Laterality: Right;  . PATCH ANGIOPLASTY Right 03/19/2016   Procedure: PATCH ANGIOPLASTY OF RIGHT CAROTID USING HEMASHIELD PLATINUM FINESSE;  Surgeon: Elam Dutch, MD;  Location: [place];  Service: Vascular;  Laterality: Right;  . SHOULDER SURGERY Bilateral    left- injections intermittent now  Rotator cuff repair on both shoulders, 2 different surgeries  . TONSILLECTOMY    . TOTAL KNEE ARTHROPLASTY Left 12/14/2013   Procedure: LEFT TOTAL KNEE ARTHROPLASTY;  Surgeon: Gearlean Alf, MD;  [facility];  Service: Orthopedics;  Laterality: Left;    Current Meds  Medication Sig  . acetaminophen (TYLENOL) 500 MG tablet Take 500-1,000 mg by mouth every 6 (six) hours as needed (for pain.).   Marland Kitchen amLODipine (NORVASC) 2.5 MG tablet Take 1 tablet (2.5 mg total) by  mouth daily.  Marland Kitchen aspirin EC 325 MG tablet Take 1 tablet (325 mg total) by mouth daily.  Marland Kitchen atorvastatin (LIPITOR) 20 MG tablet TAKE 1 TABLET (20 MG TOTAL) BY MOUTH EVERY OTHER DAY.  . Multiple Vitamin (MULTIVITAMIN WITH MINERALS) TABS tablet Take 1 tablet by mouth daily.    12 system ROS was negative unless otherwise noted in HPI    Observations/Objective:  DATA  Carotid Duplex (07-22-18): Right Carotid: There is no evidence of  stenosis in the right ICA. Left Carotid: Velocities in the left ICA are consistent with a 1-39% stenosis. Vertebrals:  Bilateral vertebral arteries demonstrate antegrade flow. Subclavians: Normal flow hemodynamics were seen in bilateral subclavian arteries. No change form the exam on 04-25-17.    Assessment and Plan: Ricky [name]. is a 73 y.o. male who sustained a right brain stroke January 2018. His primary symptom was left hand clumsiness.  He underwent a right carotid endarterectomy 03/19/2016 by Dr. Oneida Alar. He has had no symptoms of TIA amaurosis or stroke since that operation. The most recent carotid duplex is from 07-22-18. This shows no stenosis in the right ICA and 1-29% stenosis in the left ICA. This is no change form the exam on 04-25-17.   He exercises for 45 minutes 4-5 [name]/week.   Follow Up Instructions:   Follow up 1 year with carotid duplex.    I discussed the assessment and treatment plan with the patient. The patient was provided an opportunity to ask questions and all were answered. The patient agreed with the plan and demonstrated an understanding of the instructions.   The patient was advised to call back or seek an in-person evaluation if the symptoms worsen or if the condition fails to improve as anticipated.  I spent 8 minutes with the patient via telephone encounter.   Gabrielle Dare Kratos [facility] Office: [phone]  10/08/2018, 12:57 PM

## 2018-10-22 DIAGNOSIS — D1801 Hemangioma of skin and subcutaneous tissue: Secondary | ICD-10-CM | POA: Diagnosis not present

## 2018-10-22 DIAGNOSIS — C44329 Squamous cell carcinoma of skin of other parts of face: Secondary | ICD-10-CM | POA: Diagnosis not present

## 2018-10-22 DIAGNOSIS — Z85828 Personal history of other malignant neoplasm of skin: Secondary | ICD-10-CM | POA: Diagnosis not present

## 2018-10-22 DIAGNOSIS — L570 Actinic keratosis: Secondary | ICD-10-CM | POA: Diagnosis not present

## 2018-10-22 DIAGNOSIS — L821 Other seborrheic keratosis: Secondary | ICD-10-CM | POA: Diagnosis not present

## 2018-10-22 DIAGNOSIS — L814 Other melanin hyperpigmentation: Secondary | ICD-10-CM | POA: Diagnosis not present

## 2018-10-22 DIAGNOSIS — C44722 Squamous cell carcinoma of skin of right lower limb, including hip: Secondary | ICD-10-CM | POA: Diagnosis not present

## 2018-10-22 DIAGNOSIS — C44622 Squamous cell carcinoma of skin of right upper limb, including shoulder: Secondary | ICD-10-CM | POA: Diagnosis not present

## 2018-10-22 DIAGNOSIS — D485 Neoplasm of uncertain behavior of skin: Secondary | ICD-10-CM | POA: Diagnosis not present

## 2018-10-22 DIAGNOSIS — D2262 Melanocytic nevi of left upper limb, including shoulder: Secondary | ICD-10-CM | POA: Diagnosis not present

## 2018-10-22 DIAGNOSIS — L819 Disorder of pigmentation, unspecified: Secondary | ICD-10-CM | POA: Diagnosis not present

## 2018-10-22 DIAGNOSIS — D229 Melanocytic nevi, unspecified: Secondary | ICD-10-CM | POA: Diagnosis not present

## 2018-11-17 ENCOUNTER — Encounter: Payer: Self-pay | Admitting: Family Medicine

## 2018-12-02 DIAGNOSIS — D2262 Melanocytic nevi of left upper limb, including shoulder: Secondary | ICD-10-CM | POA: Diagnosis not present

## 2018-12-02 DIAGNOSIS — D485 Neoplasm of uncertain behavior of skin: Secondary | ICD-10-CM | POA: Diagnosis not present

## 2019-01-19 DIAGNOSIS — H43813 Vitreous degeneration, bilateral: Secondary | ICD-10-CM | POA: Diagnosis not present

## 2019-01-19 DIAGNOSIS — H02831 Dermatochalasis of right upper eyelid: Secondary | ICD-10-CM | POA: Diagnosis not present

## 2019-01-19 DIAGNOSIS — H2513 Age-related nuclear cataract, bilateral: Secondary | ICD-10-CM | POA: Diagnosis not present

## 2019-01-20 ENCOUNTER — Other Ambulatory Visit: Payer: Self-pay | Admitting: Family Medicine

## 2019-01-23 ENCOUNTER — Other Ambulatory Visit: Payer: Self-pay

## 2019-01-26 ENCOUNTER — Encounter: Payer: Self-pay | Admitting: Family Medicine

## 2019-01-26 ENCOUNTER — Ambulatory Visit (INDEPENDENT_AMBULATORY_CARE_PROVIDER_SITE_OTHER): Payer: Medicare Other | Admitting: Family Medicine

## 2019-01-26 VITALS — BP 140/80 | HR 51 | Temp 97.8°F | Ht 65.0 in | Wt 147.8 lb

## 2019-01-26 DIAGNOSIS — Z8673 Personal history of transient ischemic attack (TIA), and cerebral infarction without residual deficits: Secondary | ICD-10-CM | POA: Diagnosis not present

## 2019-01-26 DIAGNOSIS — Z1211 Encounter for screening for malignant neoplasm of colon: Secondary | ICD-10-CM

## 2019-01-26 DIAGNOSIS — E785 Hyperlipidemia, unspecified: Secondary | ICD-10-CM

## 2019-01-26 DIAGNOSIS — I10 Essential (primary) hypertension: Secondary | ICD-10-CM

## 2019-01-26 DIAGNOSIS — I6521 Occlusion and stenosis of right carotid artery: Secondary | ICD-10-CM

## 2019-01-26 DIAGNOSIS — I69359 Hemiplegia and hemiparesis following cerebral infarction affecting unspecified side: Secondary | ICD-10-CM

## 2019-01-26 DIAGNOSIS — I6523 Occlusion and stenosis of bilateral carotid arteries: Secondary | ICD-10-CM

## 2019-01-26 LAB — COMPREHENSIVE METABOLIC PANEL
ALT: 21 U/L (ref 0–53)
AST: 25 U/L (ref 0–37)
Albumin: 4 g/dL (ref 3.5–5.2)
Alkaline Phosphatase: 54 U/L (ref 39–117)
BUN: 22 mg/dL (ref 6–23)
CO2: 30 mEq/L (ref 19–32)
Calcium: 9.4 mg/dL (ref 8.4–10.5)
Chloride: 106 mEq/L (ref 96–112)
Creatinine, Ser: 0.86 mg/dL (ref 0.40–1.50)
GFR: 86.66 mL/min (ref >60.00)
Glucose, Bld: 103 mg/dL — ABNORMAL HIGH (ref 70–99)
Potassium: 4.2 mEq/L (ref 3.5–5.1)
Sodium: 142 mEq/L (ref 135–145)
Total Bilirubin: 0.5 mg/dL (ref 0.2–1.2)
Total Protein: 6.3 g/dL (ref 6.0–8.3)

## 2019-01-26 LAB — CBC WITH DIFFERENTIAL/PLATELET
Basophils Absolute: 0 10*3/uL (ref 0.0–0.1)
Basophils Relative: 0.5 % (ref 0.0–3.0)
Eosinophils Absolute: 0.2 10*3/uL (ref 0.0–0.7)
Eosinophils Relative: 4.5 % (ref 0.0–5.0)
HCT: 41.9 % (ref 39.0–52.0)
Hemoglobin: 13.9 g/dL (ref 13.0–17.0)
Lymphocytes Relative: 30.9 % (ref 12.0–46.0)
Lymphs Abs: 1.2 10*3/uL (ref 0.7–4.0)
MCHC: 33.1 g/dL (ref 30.0–36.0)
MCV: 96 fl (ref 78.0–100.0)
Monocytes Absolute: 0.4 10*3/uL (ref 0.1–1.0)
Monocytes Relative: 11.1 % (ref 3.0–12.0)
Neutro Abs: 2 10*3/uL (ref 1.4–7.7)
Neutrophils Relative %: 53 % (ref 43.0–77.0)
Platelets: 239 10*3/uL (ref 150.0–400.0)
RBC: 4.37 Mil/uL (ref 4.22–5.81)
RDW: 13.8 % (ref 11.5–15.5)
WBC: 3.7 10*3/uL — ABNORMAL LOW (ref 4.0–10.5)

## 2019-01-26 LAB — LIPID PANEL
Cholesterol: 114 mg/dL (ref 0–200)
HDL: 57.3 mg/dL (ref >39.00)
LDL Cholesterol: 48 mg/dL (ref 0–99)
NonHDL: 56.22
Total CHOL/HDL Ratio: 2
Triglycerides: 41 mg/dL (ref 0.0–149.0)
VLDL: 8.2 mg/dL (ref 0.0–40.0)

## 2019-01-26 MED ORDER — AMLODIPINE BESYLATE 5 MG PO TABS: 5.0000 mg | ORAL_TABLET | Freq: Every day | ORAL | 3 refills | Status: DC

## 2019-01-26 MED ORDER — ATORVASTATIN CALCIUM 20 MG PO TABS: 20.0000 mg | ORAL_TABLET | ORAL | 3 refills | Status: DC

## 2019-01-26 NOTE — Patient Instructions (Addendum)
Health Maintenance Due  Topic Date Due  . Fecal DNA (Cologuard) - look out in the mail for cologuard 01/19/2019    We opted since he is in good shape and I think his risk of falls is lower to try amlodipine 5 mg- he will update me in 2-4 weeks either by mychart or dropping off his #s for me. We will check back in 6 months as long as he tolerates this adjustment. He will watch out for lightheadedness/dizziness or if BP gets below 110/60 consistently.   Please stop by lab before you go If you do not have mychart- we will call you about results within 5 business days of [place] receiving them.  If you have mychart- we will send your results within 3 business days of [place] receiving them.  If abnormal or we want to clarify a result, we will call or mychart you to make sure you receive the message.  If you have questions or concerns or don't hear within 5-7 days, please send [place] a message or call us.

## 2019-01-26 NOTE — Progress Notes (Signed)
Phone [phone] In person visit   Subjective:   Ricky [name]. is a 73 y.o. year old very pleasant male patient who presents for/with See problem oriented charting Chief Complaint  Patient presents with  . Follow-up  . Hyperlipidemia  . Hypertension   ROS- Pt denies HA, blurred vision and chest discomfort.  No fever or chills  This visit occurred during the SARS-CoV-2 public health emergency.  Safety protocols were in place, including screening questions prior to the visit, additional usage of staff PPE, and extensive cleaning of exam room while observing appropriate contact time as indicated for disinfecting solutions.   Past Medical History-  Patient Active Problem List   Diagnosis Date Noted  . Hemiparesis affecting dominant side as late effect of stroke ([place]) 03/08/2016    Priority: High  . History of CVA (cerebrovascular accident) 02/28/2016    Priority: High  . Carotid artery stenosis, symptomatic, right 03/19/2016    Priority: Medium  . Hyperlipidemia 03/08/2016    Priority: Medium  . Essential hypertension 12/20/2014    Priority: Medium  . Trigger middle finger of left hand 11/12/2016    Priority: Low  . OA (osteoarthritis) of knee 12/14/2013    Priority: Low  . Leukopenia 01/08/2017    Medications- reviewed and updated Current Outpatient Medications  Medication Sig Dispense Refill  . acetaminophen (TYLENOL) 500 MG tablet Take 500-1,000 mg by mouth every 6 (six) hours as needed (for pain.).     Marland Kitchen amLODipine (NORVASC) 2.5 MG tablet Take 1 tablet (2.5 mg total) by mouth daily. 90 tablet 3  . aspirin EC 325 MG tablet Take 1 tablet (325 mg total) by mouth daily. 30 tablet 0  . atorvastatin (LIPITOR) 20 MG tablet TAKE 1 TABLET (20 MG TOTAL) BY MOUTH EVERY OTHER DAY. 46 tablet 1  . Multiple Vitamin (MULTIVITAMIN WITH MINERALS) TABS tablet Take 1 tablet by mouth daily.     No current facility-administered medications for this visit.     Objective:  BP 140/80    Pulse (!) 51   Temp 97.8 F (36.6 C)   Ht 5\' 5"  (1.651 m)   Wt 147 lb 12.8 oz (67 kg)   SpO2 96%   BMI 24.60 kg/m  Gen: NAD, resting comfortably CV: bradycardic but regular -no murmurs rubs or gallops Lungs: CTAB no crackles, wheeze, rhonchi Ext: no edema Skin: warm, dry Neuro: mild left sided weakness    Assessment and Plan   #hypertension S: compliant with amlodipine 2.5mg ,  diet and exercise are going good  Home readings  133 83  132 89  134 85  137 85  138 92  134 87  126 86   He misses the gym- trying to lift some weights at home and trying to do some stairs BP Readings from Last 3 Encounters:  01/26/19 140/80  09/22/18 130/74  07/29/18 124/86  A/P: home average 133/87 for last 7 readings (when excluding highs which he then repeated) if we include highs average is 139/87. We opted since he is in good shape and I think his risk of falls is lower to try amlodipine 5 mg- he will update me in 2-4 weeks either by mychart or dropping off his #s for me. We will check back in 6 months as long as he tolerates this adjustment. He will watch out for lightheadedness/dizziness or if BP gets below 110/60 consistently.   #hyperlipidemia #Hemiparesis affecting dominant side as late effect of stroke #Carotid stenosis S: compliant with atorvastatin 20mg   every other day.  He is also compliant with his aspirin 325  Hemiparesis is stable- fumbles objects in left hand still.  Carotid stenosis now status post right carotid endarterectomy February 2018.  Carotid duplex June 2020 with no evidence of stenosis in the right.  In the left 1 to 39% stenosis Lab Results  Component Value Date   CHOL 110 01/10/2018   HDL 65.60 01/10/2018   LDLCALC 39 01/10/2018   LDLDIRECT 50.0 08/01/2017   TRIG 29.0 01/10/2018   CHOLHDL 2 01/10/2018   A/P: Hyperlipidemia previously well controlled-update lipid panel on atorvastatin every other day  Hemiparesis is stable-continue to monitor-no signs of  recurrent stroke.  Continue aspirin as well  Carotid stenosis-doing well on last carotid duplex-continues to follow with vascular surgery.  Continue current medications  #Health maintenance-encouraged patient to complete Cologuard- sent in again today- this should be his last test if normal  #trigger finger doing well with brace at night   Recommended follow up: Return in about 6 months (around 07/27/2019) for follow up- or sooner if needed.  Lab/Order associations: black coffee only   ICD-10-CM   1. Hyperlipidemia, unspecified hyperlipidemia type  E78.5 CBC with Differential/Platelet    Comprehensive metabolic panel    Lipid panel  2. Essential hypertension  I10 CBC with Differential/Platelet    Comprehensive metabolic panel    Lipid panel  3. Carotid artery stenosis, symptomatic, right  I65.21   4. History of CVA (cerebrovascular accident)  Z86.73   5. Hemiparesis affecting dominant side as late effect of stroke ([place])  I69.359     No orders of the defined types were placed in this encounter.   Return precautions advised.  Garret Reddish, MD

## 2019-01-28 ENCOUNTER — Telehealth: Payer: Self-pay | Admitting: Family Medicine

## 2019-01-28 NOTE — Telephone Encounter (Signed)
See note  Copied from [place] [id number]. Topic: General - Call Back - No Documentation >> Jan 27, 2019  5:41 PM Erick Blinks wrote: Reason for CRM: Pt returned call to the office, he says he received a message to call the office back. Please advise  [phone]

## 2019-01-28 NOTE — Telephone Encounter (Signed)
Looks like you called pt, not sure what you were calling him about.

## 2019-01-29 NOTE — Telephone Encounter (Signed)
Had called about labs but looks like patient has reviewed online

## 2019-02-10 DIAGNOSIS — Z1211 Encounter for screening for malignant neoplasm of colon: Secondary | ICD-10-CM | POA: Diagnosis not present

## 2019-02-14 LAB — COLOGUARD: Cologuard: NEGATIVE

## 2019-02-16 ENCOUNTER — Ambulatory Visit: Payer: Medicare Other | Attending: Internal Medicine

## 2019-02-16 DIAGNOSIS — Z23 Encounter for immunization: Secondary | ICD-10-CM | POA: Diagnosis not present

## 2019-02-16 NOTE — Progress Notes (Signed)
   [id number]  Name:  Ricky [name].    MRN: [id number] DOB: 03/04/1945  02/16/2019  [name] was observed post Covid-19 immunization for 30 minutes based on pre-vaccination screening without incidence. He was provided with Vaccine Information Sheet and instruction to access the V-Safe system.   [name] was instructed to call 911 with any severe reactions post vaccine: Marland Kitchen Difficulty breathing  . Swelling of your face and throat  . A fast heartbeat  . A bad rash all over your body  . Dizziness and weakness    Immunizations Administered    Name Date Dose VIS Date Route   Pfizer COVID-19 Vaccine 02/16/2019  9:47 AM 0.3 mL 01/16/2019 Intramuscular   Manufacturer: [place]   Lot: [id number]   [place]: [id number]

## 2019-03-06 ENCOUNTER — Ambulatory Visit: Payer: Medicare Other

## 2019-03-07 ENCOUNTER — Ambulatory Visit: Payer: Medicare Other | Attending: Internal Medicine

## 2019-03-07 DIAGNOSIS — Z23 Encounter for immunization: Secondary | ICD-10-CM

## 2019-03-07 NOTE — Progress Notes (Signed)
   [id number]  Name:  Ricky [name].    MRN: [id number] DOB: Sep 20, 1945  03/07/2019  Mr. Guirguis was observed post Covid-19 immunization for 15 minutes without incidence. He was provided with Vaccine Information Sheet and instruction to access the V-Safe system.   Mr. Embree was instructed to call 911 with any severe reactions post vaccine: Marland Kitchen Difficulty breathing  . Swelling of your face and throat  . A fast heartbeat  . A bad rash all over your body  . Dizziness and weakness    Immunizations Administered    Name Date Dose VIS Date Route   Pfizer COVID-19 Vaccine 03/07/2019  8:33 AM 0.3 mL 01/16/2019 Intramuscular   Manufacturer: [place]   Lot: [id number]   [place]: [id number]

## 2019-03-08 ENCOUNTER — Other Ambulatory Visit: Payer: Self-pay | Admitting: Family Medicine

## 2019-03-18 DIAGNOSIS — L905 Scar conditions and fibrosis of skin: Secondary | ICD-10-CM | POA: Diagnosis not present

## 2019-03-18 DIAGNOSIS — D229 Melanocytic nevi, unspecified: Secondary | ICD-10-CM | POA: Diagnosis not present

## 2019-03-18 DIAGNOSIS — Z85828 Personal history of other malignant neoplasm of skin: Secondary | ICD-10-CM | POA: Diagnosis not present

## 2019-03-18 DIAGNOSIS — L814 Other melanin hyperpigmentation: Secondary | ICD-10-CM | POA: Diagnosis not present

## 2019-03-18 DIAGNOSIS — L821 Other seborrheic keratosis: Secondary | ICD-10-CM | POA: Diagnosis not present

## 2019-06-16 ENCOUNTER — Telehealth: Payer: Self-pay | Admitting: Family Medicine

## 2019-06-16 NOTE — Telephone Encounter (Signed)
I have placed two large finger braces up front for pt to pick up.

## 2019-06-16 NOTE — Telephone Encounter (Signed)
Patient returned call, stating he needs size large. He will be in either this afternoon or in the morning.

## 2019-06-16 NOTE — Telephone Encounter (Signed)
Please call pt and have him come back in for finger brace. Please ask what size? Thanks

## 2019-06-16 NOTE — Telephone Encounter (Signed)
Patient came in to get an finger brace, says he comes here and gets them free when they wear out. Per Tammy, we are to send back a note to advise.

## 2019-06-16 NOTE — Telephone Encounter (Signed)
Butch Penny states she does have multiple finger braces that the patient can choose from.  I have left VM for patient to call back with the size he would like or he could come in around 11:30am before lunch to meet with Butch Penny.

## 2019-08-03 ENCOUNTER — Other Ambulatory Visit: Payer: Self-pay

## 2019-08-03 ENCOUNTER — Encounter: Payer: Self-pay | Admitting: Family Medicine

## 2019-08-03 ENCOUNTER — Ambulatory Visit (INDEPENDENT_AMBULATORY_CARE_PROVIDER_SITE_OTHER): Payer: Medicare Other | Admitting: Family Medicine

## 2019-08-03 VITALS — BP 130/80 | HR 59 | Temp 98.3°F | Ht 65.0 in | Wt 140.4 lb

## 2019-08-03 DIAGNOSIS — D72819 Decreased white blood cell count, unspecified: Secondary | ICD-10-CM

## 2019-08-03 DIAGNOSIS — E785 Hyperlipidemia, unspecified: Secondary | ICD-10-CM

## 2019-08-03 DIAGNOSIS — I10 Essential (primary) hypertension: Secondary | ICD-10-CM | POA: Diagnosis not present

## 2019-08-03 DIAGNOSIS — R739 Hyperglycemia, unspecified: Secondary | ICD-10-CM

## 2019-08-03 DIAGNOSIS — I69359 Hemiplegia and hemiparesis following cerebral infarction affecting unspecified side: Secondary | ICD-10-CM | POA: Diagnosis not present

## 2019-08-03 DIAGNOSIS — R351 Nocturia: Secondary | ICD-10-CM

## 2019-08-03 LAB — POC URINALSYSI DIPSTICK (AUTOMATED)
Bilirubin, UA: NEGATIVE
Blood, UA: NEGATIVE
Glucose, UA: NEGATIVE
Ketones, UA: NEGATIVE
Leukocytes, UA: NEGATIVE
Nitrite, UA: POSITIVE
Protein, UA: NEGATIVE
Spec Grav, UA: 1.02 (ref 1.010–1.025)
Urobilinogen, UA: 0.2 E.U./dL
pH, UA: 6 (ref 5.0–8.0)

## 2019-08-03 LAB — HEMOGLOBIN A1C: Hgb A1c MFr Bld: 5.7 % (ref 4.6–6.5)

## 2019-08-03 LAB — CBC WITH DIFFERENTIAL/PLATELET
Basophils Absolute: 0 10*3/uL (ref 0.0–0.1)
Basophils Relative: 1 % (ref 0.0–3.0)
Eosinophils Absolute: 0.2 10*3/uL (ref 0.0–0.7)
Eosinophils Relative: 6.7 % — ABNORMAL HIGH (ref 0.0–5.0)
HCT: 34.7 % — ABNORMAL LOW (ref 39.0–52.0)
Hemoglobin: 12 g/dL — ABNORMAL LOW (ref 13.0–17.0)
Lymphocytes Relative: 28.4 % (ref 12.0–46.0)
Lymphs Abs: 0.9 10*3/uL (ref 0.7–4.0)
MCHC: 34.7 g/dL (ref 30.0–36.0)
MCV: 96.8 fl (ref 78.0–100.0)
Monocytes Absolute: 0.3 10*3/uL (ref 0.1–1.0)
Monocytes Relative: 9.8 % (ref 3.0–12.0)
Neutro Abs: 1.7 10*3/uL (ref 1.4–7.7)
Neutrophils Relative %: 54.1 % (ref 43.0–77.0)
Platelets: 141 10*3/uL — ABNORMAL LOW (ref 150.0–400.0)
RBC: 3.58 Mil/uL — ABNORMAL LOW (ref 4.22–5.81)
RDW: 13.9 % (ref 11.5–15.5)
WBC: 3.1 10*3/uL — ABNORMAL LOW (ref 4.0–10.5)

## 2019-08-03 LAB — LDL CHOLESTEROL, DIRECT: Direct LDL: 44 mg/dL

## 2019-08-03 LAB — COMPREHENSIVE METABOLIC PANEL
ALT: 23 U/L (ref 0–53)
AST: 28 U/L (ref 0–37)
Albumin: 4.1 g/dL (ref 3.5–5.2)
Alkaline Phosphatase: 55 U/L (ref 39–117)
BUN: 21 mg/dL (ref 6–23)
CO2: 26 mEq/L (ref 19–32)
Calcium: 9.1 mg/dL (ref 8.4–10.5)
Chloride: 104 mEq/L (ref 96–112)
Creatinine, Ser: 0.87 mg/dL (ref 0.40–1.50)
GFR: 85.39 mL/min (ref >60.00)
Glucose, Bld: 91 mg/dL (ref 70–99)
Potassium: 4.2 mEq/L (ref 3.5–5.1)
Sodium: 140 mEq/L (ref 135–145)
Total Bilirubin: 0.6 mg/dL (ref 0.2–1.2)
Total Protein: 6.5 g/dL (ref 6.0–8.3)

## 2019-08-03 LAB — PSA: PSA: 0.87 ng/mL (ref 0.10–4.00)

## 2019-08-03 NOTE — Addendum Note (Signed)
Addended by: Doran Clay A on: 08/03/2019 08:30 AM   Modules accepted: Orders

## 2019-08-03 NOTE — Patient Instructions (Addendum)
I think checking in with Dr. Elmyra Ricks is reasonable  Please stop by lab before you go If you have mychart- we will send your results within 3 business days of [place] receiving them.  If you do not have mychart- we will call you about results within 5 business days of [place] receiving them.

## 2019-08-03 NOTE — Assessment & Plan Note (Signed)
S: medication: Amlodipine 5mg - increased from 2.5mg  last visit Home readings #s: mostly less than 140/90 at home- some #s higher but then some #s much lower. Honestly do not see a huge difference from 2.5 mg dose- perhaps slightly lower average  Still doing gym on most weekdays-machines, bike, weights. . Healthy weight- reasonably healthy diet.  BP Readings from Last 3 Encounters:  08/03/19 130/80  01/26/19 140/80  09/22/18 130/74  A/P: better in office today. Overall Well-controlled-continue current medication

## 2019-08-03 NOTE — Progress Notes (Signed)
Phone [phone] In person visit   Subjective:   Ricky [name]. is a 74 y.o. year old very pleasant male patient who presents for/with See problem oriented charting Chief Complaint  Patient presents with  . Follow-up  . Hyperlipidemia  . Hypertension    This visit occurred during the SARS-CoV-2 public health emergency.  Safety protocols were in place, including screening questions prior to the visit, additional usage of staff PPE, and extensive cleaning of exam room while observing appropriate contact time as indicated for disinfecting solutions.   Past Medical History-  Patient Active Problem List   Diagnosis Date Noted  . Hemiparesis affecting dominant side as late effect of stroke ([place]) 03/08/2016    Priority: High  . History of CVA (cerebrovascular accident) 02/28/2016    Priority: High  . Carotid artery stenosis, symptomatic, right 03/19/2016    Priority: Medium  . Hyperlipidemia 03/08/2016    Priority: Medium  . Essential hypertension 12/20/2014    Priority: Medium  . Trigger middle finger of left hand 11/12/2016    Priority: Low  . OA (osteoarthritis) of knee 12/14/2013    Priority: Low  . Leukopenia 01/08/2017    Medications- reviewed and updated Current Outpatient Medications  Medication Sig Dispense Refill  . acetaminophen (TYLENOL) 500 MG tablet Take 500-1,000 mg by mouth every 6 (six) hours as needed (for pain.).     Marland Kitchen amLODipine (NORVASC) 5 MG tablet Take 1 tablet (5 mg total) by mouth daily. 90 tablet 3  . aspirin EC 325 MG tablet Take 1 tablet (325 mg total) by mouth daily. 30 tablet 0  . atorvastatin (LIPITOR) 20 MG tablet TAKE 1 TABLET (20 MG TOTAL) BY MOUTH EVERY OTHER DAY. 46 tablet 1  . Multiple Vitamin (MULTIVITAMIN WITH MINERALS) TABS tablet Take 1 tablet by mouth daily.        Objective:  BP 130/80   Pulse (!) 59   Temp 98.3 F (36.8 C)   Ht 5\' 5"  (1.651 m)   Wt 140 lb 6.4 oz (63.7 kg)   SpO2 100%   BMI 23.36 kg/m  Gen: NAD,  resting comfortably CV: RRR no murmurs rubs or gallops Lungs: CTAB no crackles, wheeze, rhonchi Ext: no edema Skin: warm, dry Neuro: grossly normal, moves all extremities Posterior drawer test with slight click    Assessment and Plan    # social update/knee update- stressful year with covid 19 but starting to do better. Did slip at bottom of the stairs 6 months ago and sometimes will hear a click with walking or some pain- history of replacement on that side. Long walks bother him and also may get some right low back pain. Encouraged him to follow up with Dr. Elmyra Ricks with orthopedics- and also advised voltaren gel. Posterior drawer test causes slight click.   #hypertension S: medication: Amlodipine 5mg - increased from 2.5mg  last visit Home readings #s: mostly less than 140/90 at home- some #s higher but then some #s much lower. Honestly do not see a huge difference from 2.5 mg dose- perhaps slightly lower average  Still doing gym on most weekdays-machines, bike, weights. . Healthy weight- reasonably healthy diet.  BP Readings from Last 3 Encounters:  08/03/19 130/80  01/26/19 140/80  09/22/18 130/74  A/P: better in office today. Overall Well-controlled-continue current medication  #hyperlipidemia/history of CVA due to carotid stenosis of the right side with hemiparesis of dominant side (left handed)-now status post right endarterectomy  S: Medication: Atorvastatin 20Mg  every other days, aspirin 325 mg  Occasionally  Drops thing sin left hand. Left anded.  Lab Results  Component Value Date   CHOL 114 01/26/2019   HDL 57.30 01/26/2019   LDLCALC 48 01/26/2019   LDLDIRECT 50.0 08/01/2017   TRIG 41.0 01/26/2019   CHOLHDL 2 01/26/2019   A/P: lipids Well-controlled with LDL under 70-continue current medication.  Hemiparesis noted/stable-continue to monitor.  # Hyperglycemia/insulin resistance/prediabetes- fasting cbg 103 01/26/2019 S:  Medication: none Exercise and diet- see above-  healthy diet, regular exercise Lab Results  Component Value Date   HGBA1C 5.4 02/29/2016   A/P: Previous A1c's will be okay but with slightly high fasting blood sugar-recheck A1c today  #Mild leukopenia-other cell lines have been normal.  Will monitor labs today Lab Results  Component Value Date   WBC 3.7 (L) 01/26/2019   HGB 13.9 01/26/2019   HCT 41.9 01/26/2019   MCV 96.0 01/26/2019   PLT 239.0 01/26/2019   # Urinary frequency- has noted increased frequency and decreased flow over the years. Out of abundance of precaution will check psa . Nocturia 1-2x a night Lab Results  Component Value Date   PSA 1.22 11/30/2013   Recommended follow up:  6 months follow up   Lab/Order associations:   ICD-10-CM   1. Hyperlipidemia, unspecified hyperlipidemia type  E78.5 CBC with Differential/Platelet    Comprehensive metabolic panel    LDL cholesterol, direct  2. Hyperglycemia  R73.9 Hemoglobin A1c  3. Essential hypertension  I10 CBC with Differential/Platelet    Comprehensive metabolic panel  4. Hemiparesis affecting dominant side as late effect of stroke [facility]) Chronic I69.359    Return precautions advised.  Garret Reddish, MD

## 2019-09-02 ENCOUNTER — Other Ambulatory Visit: Payer: Self-pay | Admitting: Family Medicine

## 2019-09-21 ENCOUNTER — Telehealth: Payer: Self-pay | Admitting: Family Medicine

## 2019-09-21 NOTE — Telephone Encounter (Signed)
Left message for patient to call back and schedule Medicare Annual Wellness Visit (AWV) either virtually/audio only OR in office. Whatever the patients preference is.  Last AWV 09/22/18; please schedule 09/23/19 OR AFTER with [facility]Nurse Health Advisor at [place].  This should be a 45 minute visit.

## 2019-09-28 DIAGNOSIS — L905 Scar conditions and fibrosis of skin: Secondary | ICD-10-CM | POA: Diagnosis not present

## 2019-09-28 DIAGNOSIS — L570 Actinic keratosis: Secondary | ICD-10-CM | POA: Diagnosis not present

## 2019-09-28 DIAGNOSIS — L819 Disorder of pigmentation, unspecified: Secondary | ICD-10-CM | POA: Diagnosis not present

## 2019-09-28 DIAGNOSIS — L814 Other melanin hyperpigmentation: Secondary | ICD-10-CM | POA: Diagnosis not present

## 2019-09-28 DIAGNOSIS — L821 Other seborrheic keratosis: Secondary | ICD-10-CM | POA: Diagnosis not present

## 2019-09-28 DIAGNOSIS — Z85828 Personal history of other malignant neoplasm of skin: Secondary | ICD-10-CM | POA: Diagnosis not present

## 2019-09-28 DIAGNOSIS — D485 Neoplasm of uncertain behavior of skin: Secondary | ICD-10-CM | POA: Diagnosis not present

## 2019-09-28 DIAGNOSIS — D229 Melanocytic nevi, unspecified: Secondary | ICD-10-CM | POA: Diagnosis not present

## 2019-10-01 ENCOUNTER — Ambulatory Visit (INDEPENDENT_AMBULATORY_CARE_PROVIDER_SITE_OTHER): Payer: Medicare Other

## 2019-10-01 ENCOUNTER — Other Ambulatory Visit: Payer: Self-pay

## 2019-10-01 VITALS — BP 120/82 | HR 55 | Resp 20 | Ht 65.0 in | Wt 137.4 lb

## 2019-10-01 DIAGNOSIS — Z0000 Encounter for general adult medical examination without abnormal findings: Secondary | ICD-10-CM

## 2019-10-01 NOTE — Patient Instructions (Addendum)
[name] , Thank you for taking time to come for your Medicare Wellness Visit. I appreciate your ongoing commitment to your health goals. Please review the following plan we discussed and let me know if I can assist you in the future.   Screening recommendations/referrals: Colonoscopy: Done 02/11/19 cologuard  Recommended yearly ophthalmology/optometry visit for glaucoma screening and checkup Recommended yearly dental visit for hygiene and checkup  Vaccinations: Influenza vaccine: Up to date Pneumococcal vaccine: Up to date Tdap vaccine: Up to date Shingles vaccine: Completed 8/22 & 12/11/18   Covid-19: Completed 1/11 & 03/07/19  Advanced directives: Advance directive discussed with you today. I have provided a copy for you to complete at home and have notarized. Once this is complete please bring a copy in to our office so we can scan it into your chart.   Conditions/risks identified: Gain muscle weight  Next appointment: Follow up in one year for your annual wellness visit.    Preventive Care 76 Years and Older, Male Preventive care refers to lifestyle choices and visits with your health care provider that can promote health and wellness. What does preventive care include?  A yearly physical exam. This is also called an annual well check.  Dental exams once or twice a year.  Routine eye exams. Ask your health care provider how often you should have your eyes checked.  Personal lifestyle choices, including:  Daily care of your teeth and gums.  Regular physical activity.  Eating a healthy diet.  Avoiding tobacco and drug use.  Limiting alcohol use.  Practicing safe sex.  Taking low doses of aspirin every day.  Taking vitamin and mineral supplements as recommended by your health care provider. What happens during an annual well check? The services and screenings done by your health care provider during your annual well check will depend on your age, overall health,  lifestyle risk factors, and family history of disease. Counseling  Your health care provider may ask you questions about your:  Alcohol use.  Tobacco use.  Drug use.  Emotional well-being.  Home and relationship well-being.  Sexual activity.  Eating habits.  History of falls.  Memory and ability to understand (cognition).  Work and work Statistician. Screening  You may have the following tests or measurements:  Height, weight, and BMI.  Blood pressure.  Lipid and cholesterol levels. These may be checked every 5 years, or more frequently if you are over 26 years old.  Skin check.  Lung cancer screening. You may have this screening every year starting at age 34 if you have a 30-pack-year history of smoking and currently smoke or have quit within the past 15 years.  Fecal occult blood test (FOBT) of the stool. You may have this test every year starting at age 23.  Flexible sigmoidoscopy or colonoscopy. You may have a sigmoidoscopy every 5 years or a colonoscopy every 10 years starting at age 34.  Prostate cancer screening. Recommendations will vary depending on your family history and other risks.  Hepatitis C blood test.  Hepatitis B blood test.  Sexually transmitted disease (STD) testing.  Diabetes screening. This is done by checking your blood sugar (glucose) after you have not eaten for a while (fasting). You may have this done every 1-3 years.  Abdominal aortic aneurysm (AAA) screening. You may need this if you are a current or former smoker.  Osteoporosis. You may be screened starting at age 68 if you are at high risk. Talk with your health care provider  about your test results, treatment options, and if necessary, the need for more tests. Vaccines  Your health care provider may recommend certain vaccines, such as:  Influenza vaccine. This is recommended every year.  Tetanus, diphtheria, and acellular pertussis (Tdap, Td) vaccine. You may need a Td booster  every 10 years.  Zoster vaccine. You may need this after age 38.  Pneumococcal 13-valent conjugate (PCV13) vaccine. One dose is recommended after age 35.  Pneumococcal polysaccharide (PPSV23) vaccine. One dose is recommended after age 67. Talk to your health care provider about which screenings and vaccines you need and how often you need them. This information is not intended to replace advice given to you by your health care provider. Make sure you discuss any questions you have with your health care provider. Document Released: 02/18/2015 Document Revised: 10/12/2015 Document Reviewed: 11/23/2014 Elsevier Interactive Patient Education  2017 [place] Prevention in the Home Falls can cause injuries. They can happen to people of all ages. There are many things you can do to make your home safe and to help prevent falls. What can I do on the outside of my home?  Regularly fix the edges of walkways and driveways and fix any cracks.  Remove anything that might make you trip as you walk through a door, such as a raised step or threshold.  Trim any bushes or trees on the path to your home.  Use bright outdoor lighting.  Clear any walking paths of anything that might make someone trip, such as rocks or tools.  Regularly check to see if handrails are loose or broken. Make sure that both sides of any steps have handrails.  Any raised decks and porches should have guardrails on the edges.  Have any leaves, snow, or ice cleared regularly.  Use sand or salt on walking paths during winter.  Clean up any spills in your garage right away. This includes oil or grease spills. What can I do in the bathroom?  Use night lights.  Install grab bars by the toilet and in the tub and shower. Do not use towel bars as grab bars.  Use non-skid mats or decals in the tub or shower.  If you need to sit down in the shower, use a plastic, non-slip stool.  Keep the floor dry. Clean up any  water that spills on the floor as soon as it happens.  Remove soap buildup in the tub or shower regularly.  Attach bath mats securely with double-sided non-slip rug tape.  Do not have throw rugs and other things on the floor that can make you trip. What can I do in the bedroom?  Use night lights.  Make sure that you have a light by your bed that is easy to reach.  Do not use any sheets or blankets that are too big for your bed. They should not hang down onto the floor.  Have a firm chair that has side arms. You can use this for support while you get dressed.  Do not have throw rugs and other things on the floor that can make you trip. What can I do in the kitchen?  Clean up any spills right away.  Avoid walking on wet floors.  Keep items that you use a lot in easy-to-reach places.  If you need to reach something above you, use a strong step stool that has a grab bar.  Keep electrical cords out of the way.  Do not use floor polish or  wax that makes floors slippery. If you must use wax, use non-skid floor wax.  Do not have throw rugs and other things on the floor that can make you trip. What can I do with my stairs?  Do not leave any items on the stairs.  Make sure that there are handrails on both sides of the stairs and use them. Fix handrails that are broken or loose. Make sure that handrails are as long as the stairways.  Check any carpeting to make sure that it is firmly attached to the stairs. Fix any carpet that is loose or worn.  Avoid having throw rugs at the top or bottom of the stairs. If you do have throw rugs, attach them to the floor with carpet tape.  Make sure that you have a light switch at the top of the stairs and the bottom of the stairs. If you do not have them, ask someone to add them for you. What else can I do to help prevent falls?  Wear shoes that:  Do not have high heels.  Have rubber bottoms.  Are comfortable and fit you well.  Are closed  at the toe. Do not wear sandals.  If you use a stepladder:  Make sure that it is fully opened. Do not climb a closed stepladder.  Make sure that both sides of the stepladder are locked into place.  Ask someone to hold it for you, if possible.  Clearly mark and make sure that you can see:  Any grab bars or handrails.  First and last steps.  Where the edge of each step is.  Use tools that help you move around (mobility aids) if they are needed. These include:  Canes.  Walkers.  Scooters.  Crutches.  Turn on the lights when you go into a dark area. Replace any light bulbs as soon as they burn out.  Set up your furniture so you have a clear path. Avoid moving your furniture around.  If any of your floors are uneven, fix them.  If there are any pets around you, be aware of where they are.  Review your medicines with your doctor. Some medicines can make you feel dizzy. This can increase your chance of falling. Ask your doctor what other things that you can do to help prevent falls. This information is not intended to replace advice given to you by your health care provider. Make sure you discuss any questions you have with your health care provider. Document Released: 11/18/2008 Document Revised: 06/30/2015 Document Reviewed: 02/26/2014 Elsevier Interactive Patient Education  2017 [employer].

## 2019-10-01 NOTE — Progress Notes (Signed)
Subjective:   Ricky [name]. is a 74 y.o. male who presents for Medicare Annual/Subsequent preventive examination.  Review of Systems     Cardiac Risk Factors include: hypertension;dyslipidemia;male gender     Objective:    Today's Vitals   10/01/19 1144 10/01/19 1149  BP:  120/82  Pulse:  (!) 55  Resp:  20  SpO2:  97%  Weight:  137 lb 6.4 oz (62.3 kg)  Height:  5\' 5"  (1.651 m)  PainSc: 1     Body mass index is 22.86 kg/m.  Advanced Directives 10/01/2019 09/22/2018 04/25/2017 01/10/2017 10/18/2016 03/19/2016 03/09/2016  Does Patient Have a Medical Advance Directive? No Yes Yes Yes Yes Yes Yes  Type of Advance Directive - [place];Living will [place];Living will - [place];Living will Living will Living will  Does patient want to make changes to medical advance directive? - No - Patient declined - - - No - Patient declined Yes (MAU/Ambulatory/Procedural Areas - Information given)  Copy of [place] in Chart? - No - copy requested No - copy requested - No - copy requested - -  Would patient like information on creating a medical advance directive? - Yes (MAU/Ambulatory/Procedural Areas - Information given) - - - No - Patient declined -    Current Medications (verified) Outpatient Encounter Medications as of 10/01/2019  Medication Sig  . acetaminophen (TYLENOL) 500 MG tablet Take 500-1,000 mg by mouth every 6 (six) hours as needed (for pain.).   Marland Kitchen amLODipine (NORVASC) 5 MG tablet Take 1 tablet (5 mg total) by mouth daily.  Marland Kitchen aspirin EC 325 MG tablet Take 1 tablet (325 mg total) by mouth daily.  Marland Kitchen atorvastatin (LIPITOR) 20 MG tablet TAKE 1 TABLET (20 MG TOTAL) BY MOUTH EVERY OTHER DAY.  . Multiple Vitamin (MULTIVITAMIN WITH MINERALS) TABS tablet Take 1 tablet by mouth daily.   No facility-administered encounter medications on file as of 10/01/2019.    Allergies (verified) Amoxicillin    History: Past Medical History:  Diagnosis Date  . Arthritis   . Carotid artery occlusion   . Complication of anesthesia    slow to wake up after shoulder surgeries  . Hyperlipidemia 03/08/2016   LDL 75 pre stroke. Goal <70 so on atorvastatin 20mg  every other day  . Hypertension   . Peripheral vascular disease ([place])   . Pneumonia   . Stroke [facility])    still has decreased sensation in left hand and arm (as of 03/09/16)   Past Surgical History:  Procedure Laterality Date  . CAROTID ENDARTERECTOMY    . ENDARTERECTOMY Right 03/19/2016   Procedure: ENDARTERECTOMY CAROTID;  Surgeon: Elam Dutch, MD;  Location: [place];  Service: Vascular;  Laterality: Right;  . PATCH ANGIOPLASTY Right 03/19/2016   Procedure: PATCH ANGIOPLASTY OF RIGHT CAROTID USING HEMASHIELD PLATINUM FINESSE;  Surgeon: Elam Dutch, MD;  Location: [place];  Service: Vascular;  Laterality: Right;  . SHOULDER SURGERY Bilateral    left- injections intermittent now  Rotator cuff repair on both shoulders, 2 different surgeries  . TONSILLECTOMY    . TOTAL KNEE ARTHROPLASTY Left 12/14/2013   Procedure: LEFT TOTAL KNEE ARTHROPLASTY;  Surgeon: Gearlean Alf, MD;  Location: [facility];  Service: Orthopedics;  Laterality: Left;   Family History  Problem Relation Age of Onset  . Heart disease Father        in 37s- specifics unclear  . Other Mother  passed in late 36s   Social History   Socioeconomic History  . Marital status: Married    Spouse name: Not on file  . Number of children: Not on file  . Years of education: Not on file  . Highest education level: Not on file  Occupational History  . Occupation: Retired  Tobacco Use  . Smoking status: Never Smoker  . Smokeless tobacco: Never Used  Substance and Sexual Activity  . Alcohol use: No    Comment: former drinker  . Drug use: No  . Sexual activity: Yes  Other Topics Concern  . Not on file  Social History Narrative   Married. 1 son. 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       Hobbies: gym daily- riding exercise bike plus weights/machines, photography- used to do a lot of black and white, gardening   Social Determinants of Health   Financial Resource Strain: Low Risk   . Difficulty of Paying Living Expenses: Not hard at all  Food Insecurity: No Food Insecurity  . Worried About Charity fundraiser in the Last Year: Never true  . Ran Out of Food in the Last Year: Never true  Transportation Needs: No Transportation Needs  . Lack of Transportation (Medical): No  . Lack of Transportation (Non-Medical): No  Physical Activity: Insufficiently Active  . Days of Exercise per Week: 2 days  . Minutes of Exercise per Session: 40 min  Stress: No Stress Concern Present  . Feeling of Stress : Not at all  Social Connections: Moderately Isolated  . Frequency of Communication with Friends and Family: Once a week  . Frequency of Social Gatherings with Friends and Family: Three times a week  . Attends Religious Services: Never  . Active Member of Clubs or Organizations: No  . Attends Archivist Meetings: Never  . Marital Status: Married    Tobacco Counseling Counseling given: Not Answered   Clinical Intake:  Pre-visit preparation completed: Yes  Pain : 0-10 (back pain for possibly needing a new matress) Pain Score: 1  Pain Location: Back Pain Descriptors / Indicators: Sore Pain Onset: More than a month ago Pain Frequency: Intermittent     BMI - recorded: 22.86 Nutritional Status: BMI of 19-24  Normal Diabetes: No  How often do you need to have someone help you when you read instructions, pamphlets, or other written materials from your doctor or pharmacy?: 1 - Never  Diabetic?No  Interpreter Needed?: No  Information entered by :: Charlott Rakes, LPN   Activities of Daily Living In your present state of health, do you have any difficulty performing the  following activities: 10/01/2019  Hearing? N  Vision? N  Difficulty concentrating or making decisions? N  Walking or climbing stairs? N  Dressing or bathing? N  Doing errands, shopping? N  Preparing Food and eating ? N  Using the Toilet? N  In the past six months, have you accidently leaked urine? N  Do you have problems with loss of bowel control? N  Managing your Medications? N  Managing your Finances? N  Housekeeping or managing your Housekeeping? N  Some recent data might be hidden    Patient Care Team: Marin Olp, MD as PCP - General (Family Medicine)  Indicate any recent Medical Services you may have received from other than [facility] providers in the past year (date may be approximate).     Assessment:  This is a routine wellness examination for [place].  Hearing/Vision screen  Hearing Screening   125Hz  250Hz  500Hz  1000Hz  2000Hz  3000Hz  4000Hz  6000Hz  8000Hz   Right ear:           Left ear:           Comments: Pt denies any difficulty hearing  Vision Screening Comments: Pt follows up annually with Dr Alanda Slim  Dietary issues and exercise activities discussed: Current Exercise Habits: Home exercise routine, Type of exercise: Other - see comments (stationary bike and walking dog), Time (Minutes): 40, Frequency (Times/Week): 4, Weekly Exercise (Minutes/Week): 160  Goals    . Patient Stated     To maintain your health; keep weight in check Keep exercises     . Patient Stated     Get a few more muscle weight       Depression Screen PHQ 2/9 Scores 10/01/2019 08/03/2019 01/26/2019 09/22/2018 01/10/2018 01/10/2017 01/10/2016  PHQ - 2 Score 0 0 0 0 0 0 0    Fall Risk Fall Risk  10/01/2019 09/22/2018 01/10/2018 12/26/2017 01/10/2017  Falls in the past year? 1 0 0 0 No  Comment - - - Emmi Telephone Survey: data to providers prior to load -  Number falls in past yr: 1 0 - - -  Injury with Fall? 1 0 - - -  Risk for fall due to : Impaired balance/gait;Impaired vision;History of  fall(s) - - - -  Follow up Falls prevention discussed - - - -   Any stairs in or around the home? Yes  If so, are there any without handrails? No  Home free of loose throw rugs in walkways, pet beds, electrical cords, etc? Yes  Adequate lighting in your home to reduce risk of falls? Yes   ASSISTIVE DEVICES UTILIZED TO PREVENT FALLS:  Life alert? No  Use of a cane, walker or w/c? No  Grab bars in the bathroom? No  Shower chair or bench in shower? No  Elevated toilet seat or a handicapped toilet? No   TIMED UP AND GO:  Was the test performed? Yes .  Length of time to ambulate 10 feet: 10 sec.     Cognitive Function: MMSE - Mini Mental State Exam 01/10/2017  Not completed: (No Data)     6CIT Screen 10/01/2019 09/22/2018  What Year? 0 points 0 points  What month? 0 points 0 points  What time? - 0 points  Count back from 20 0 points 0 points  Months in reverse 0 points 0 points  Repeat phrase 0 points 0 points  Total Score - 0    Immunizations Immunization History  Administered Date(s) Administered  . Influenza, High Dose Seasonal PF 01/10/2016, 11/12/2016, 12/10/2017, 09/28/2018  . Influenza,inj,Quad PF,6+ Mos 11/30/2013, 12/20/2014  . PFIZER SARS-COV-2 Vaccination 02/16/2019, 03/07/2019  . Pneumococcal Conjugate-13 11/30/2013  . Pneumococcal Polysaccharide-23 12/20/2014  . Td 04/20/2014  . Zoster Recombinat (Shingrix) 09/27/2018, 12/11/2018    TDAP status: Up to date Flu Vaccine status: Up to date Pneumococcal vaccine status: Up to date Covid-19 vaccine status: Completed vaccines  Qualifies for Shingles Vaccine? Yes   Zostavax completed Yes   Shingrix Completed?: Yes  Screening Tests Health Maintenance  Topic Date Due  . INFLUENZA VACCINE  09/06/2019  . Fecal DNA (Cologuard)  02/10/2022  . TETANUS/TDAP  04/19/2024  . COVID-19 Vaccine  Completed  . Hepatitis C Screening  Completed  . PNA vac Low Risk Adult  Completed    Health Maintenance  Health  Maintenance Due  Topic Date Due  . INFLUENZA VACCINE  09/06/2019    Colorectal cancer screening: Completed 02/11/19. Repeat every 3 years   Additional Screening:  Hepatitis C Screening: Completed 12/20/14  Vision Screening: Recommended annual ophthalmology exams for early detection of glaucoma and other disorders of the eye. Is the patient up to date with their annual eye exam?  Yes  Who is the provider or what is the name of the office in which the patient attends annual eye exams? Dr Alanda Slim  Dental Screening: Recommended annual dental exams for proper oral hygiene  Community Resource Referral / Chronic Care Management: CRR required this visit?  No   CCM required this visit?  No      Plan:     I have personally reviewed and noted the following in the patient's chart:   . Medical and social history . Use of alcohol, tobacco or illicit drugs  . Current medications and supplements . Functional ability and status . Nutritional status . Physical activity . Advanced directives . List of other physicians . Hospitalizations, surgeries, and ER visits in previous 12 months . Vitals . Screenings to include cognitive, depression, and falls . Referrals and appointments  In addition, I have reviewed and discussed with patient certain preventive protocols, quality metrics, and best practice recommendations. A written personalized care plan for preventive services as well as general preventive health recommendations were provided to patient.     Willette Brace, LPN   0/16/0109   Nurse Notes: None

## 2019-10-15 ENCOUNTER — Other Ambulatory Visit: Payer: Self-pay | Admitting: Family Medicine

## 2019-10-18 DIAGNOSIS — Z23 Encounter for immunization: Secondary | ICD-10-CM | POA: Diagnosis not present

## 2019-11-11 DIAGNOSIS — Z23 Encounter for immunization: Secondary | ICD-10-CM | POA: Diagnosis not present

## 2019-11-24 ENCOUNTER — Other Ambulatory Visit: Payer: Self-pay

## 2019-11-24 DIAGNOSIS — I6523 Occlusion and stenosis of bilateral carotid arteries: Secondary | ICD-10-CM

## 2019-12-09 ENCOUNTER — Ambulatory Visit (INDEPENDENT_AMBULATORY_CARE_PROVIDER_SITE_OTHER): Payer: Medicare Other | Admitting: Physician Assistant

## 2019-12-09 ENCOUNTER — Other Ambulatory Visit: Payer: Self-pay

## 2019-12-09 ENCOUNTER — Ambulatory Visit (HOSPITAL_COMMUNITY)
Admission: RE | Admit: 2019-12-09 | Discharge: 2019-12-09 | Disposition: A | Discharge status: Home / Self Care | Payer: Medicare Other | Source: Ambulatory Visit | Attending: Vascular Surgery | Admitting: Vascular Surgery

## 2019-12-09 VITALS — BP 184/85 | HR 51 | Temp 98.7°F | Resp 20 | Ht 65.0 in | Wt 138.5 lb

## 2019-12-09 DIAGNOSIS — I6523 Occlusion and stenosis of bilateral carotid arteries: Secondary | ICD-10-CM

## 2019-12-09 NOTE — Progress Notes (Signed)
HISTORY AND PHYSICAL     CC:  follow up. Requesting Provider:  Marin Olp, MD  HPI: This is a 74 y.o. male here for follow up for carotid artery stenosis.  Pt is s/p right CEA on 03/19/2016 by Dr. Oneida Alar.    Pt was last seen in September 2020 and at that time he was doing well without any sx of stroke.    Pt returns today for follow up.  Pt denies any amaurosis fugax, speech difficulties, weakness, numbness, paralysis or clumsiness.  He states he still has some numbness in his left hand from previous stroke but this is unchanged.  His blood pressure is elevated today.  He was seen in June by Dr. Yong Channel at which time, his CCB dose was increased.  Pt states he takes his blood pressure every day and it does not run this high.  We will recheck this before he leaves.  He continues to go to the gym regularly.    The pt is on a statin for cholesterol management.  The pt is on a daily aspirin.   Other AC:  none The pt is on CCB for hypertension.   The pt is not diabetic.   Tobacco hx:  never  Pt does not have family hx of AAA.  Past Medical History:  Diagnosis Date  . Arthritis   . Carotid artery occlusion   . Complication of anesthesia    slow to wake up after shoulder surgeries  . Hyperlipidemia 03/08/2016   LDL 75 pre stroke. Goal <70 so on atorvastatin 20mg  every other day  . Hypertension   . Peripheral vascular disease ([place])   . Pneumonia   . Stroke [facility])    still has decreased sensation in left hand and arm (as of 03/09/16)    Past Surgical History:  Procedure Laterality Date  . CAROTID ENDARTERECTOMY    . ENDARTERECTOMY Right 03/19/2016   Procedure: ENDARTERECTOMY CAROTID;  Surgeon: Elam Dutch, MD;  Location: [place];  Service: Vascular;  Laterality: Right;  . PATCH ANGIOPLASTY Right 03/19/2016   Procedure: PATCH ANGIOPLASTY OF RIGHT CAROTID USING HEMASHIELD PLATINUM FINESSE;  Surgeon: Elam Dutch, MD;  Location: [place];  Service: Vascular;  Laterality: Right;  .  SHOULDER SURGERY Bilateral    left- injections intermittent now  Rotator cuff repair on both shoulders, 2 different surgeries  . TONSILLECTOMY    . TOTAL KNEE ARTHROPLASTY Left 12/14/2013   Procedure: LEFT TOTAL KNEE ARTHROPLASTY;  Surgeon: Gearlean Alf, MD;  [facility];  Service: Orthopedics;  Laterality: Left;    Allergies  Allergen Reactions  . Amoxicillin Rash    Has patient had a PCN reaction causing immediate rash, facial/tongue/throat swelling, SOB or lightheadedness with hypotension:unsure Has patient had a PCN reaction causing severe rash involving mucus membranes or skin necrosis:unsure Has patient had a PCN reaction that required hospitalization No Has patient had a PCN reaction occurring within the last 10 years: No If all of the above answers are "NO", then may proceed with Cephalosporin use.     Current Outpatient Medications  Medication Sig Dispense Refill  . acetaminophen (TYLENOL) 500 MG tablet Take 500-1,000 mg by mouth every 6 (six) hours as needed (for pain.).     Marland Kitchen amLODipine (NORVASC) 5 MG tablet TAKE 1 TABLET BY MOUTH EVERY DAY 90 tablet 3  . aspirin EC 325 MG tablet Take 1 tablet (325 mg total) by mouth daily. 30 tablet 0  . atorvastatin (LIPITOR) 20 MG  tablet TAKE 1 TABLET (20 MG TOTAL) BY MOUTH EVERY OTHER DAY. 46 tablet 1  . Multiple Vitamin (MULTIVITAMIN WITH MINERALS) TABS tablet Take 1 tablet by mouth daily.     No current facility-administered medications for this visit.    Family History  Problem Relation Age of Onset  . Heart disease Father        in 8s- specifics unclear  . Other Mother        passed in late 45s    Social History   Socioeconomic History  . Marital status: Married    Spouse name: Not on file  . Number of children: Not on file  . Years of education: Not on file  . Highest education level: Not on file  Occupational History  . Occupation: Retired  Tobacco Use  . Smoking status: Never Smoker  . Smokeless tobacco:  Never Used  Substance and Sexual Activity  . Alcohol use: No    Comment: former drinker  . Drug use: No  . Sexual activity: Yes  Other Topics Concern  . Not on file  Social History Narrative   Married. 1 son. 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       Hobbies: gym daily- riding exercise bike plus weights/machines, photography- used to do a lot of black and white, gardening   Social Determinants of Health   Financial Resource Strain: Low Risk   . Difficulty of Paying Living Expenses: Not hard at all  Food Insecurity: No Food Insecurity  . Worried About Charity fundraiser in the Last Year: Never true  . Ran Out of Food in the Last Year: Never true  Transportation Needs: No Transportation Needs  . Lack of Transportation (Medical): No  . Lack of Transportation (Non-Medical): No  Physical Activity: Insufficiently Active  . Days of Exercise per Week: 2 days  . Minutes of Exercise per Session: 40 min  Stress: No Stress Concern Present  . Feeling of Stress : Not at all  Social Connections: Moderately Isolated  . Frequency of Communication with Friends and Family: Once a week  . Frequency of Social Gatherings with Friends and Family: Three times a week  . Attends Religious Services: Never  . Active Member of Clubs or Organizations: No  . Attends Archivist Meetings: Never  . Marital Status: Married  Human resources officer Violence: Not At Risk  . Fear of Current or Ex-Partner: No  . Emotionally Abused: No  . Physically Abused: No  . Sexually Abused: No     REVIEW OF SYSTEMS:   [X]  denotes positive finding, [ ]  denotes negative finding Cardiac  Comments:  Chest pain or chest pressure:    Shortness of breath upon exertion:    Short of breath when lying flat:    Irregular heart rhythm:        Vascular    Pain in calf, thigh, or hip brought on by ambulation:    Pain in feet at night that wakes you up from  your sleep:     Blood clot in your veins:    Leg swelling:         Pulmonary    Oxygen at home:    Productive cough:     Wheezing:         Neurologic    Sudden weakness in arms or legs:     Sudden numbness in arms or legs:  Sudden onset of difficulty speaking or slurred speech:    Temporary loss of vision in one eye:     Problems with dizziness:         Gastrointestinal    Blood in stool:     Vomited blood:         Genitourinary    Burning when urinating:     Blood in urine:        Psychiatric    Major depression:         Hematologic    Bleeding problems:    Problems with blood clotting too easily:        Skin    Rashes or ulcers:        Constitutional    Fever or chills:      PHYSICAL EXAMINATION:  Today's Vitals   12/09/19 0831 12/09/19 0834  BP: (!) 183/81 (!) 184/85  Pulse: (!) 51   Resp: 20   Temp: 98.7 F (37.1 C)   TempSrc: Temporal   SpO2: 100%   Weight: 138 lb 8 oz (62.8 kg)   Height: 5\' 5"  (1.651 m)    Body mass index is 23.05 kg/m.   General:  WDWN in NAD; vital signs documented above Gait: Not observed HENT: WNL, normocephalic Pulmonary: normal non-labored breathing Cardiac: regular HR, without murmur without carotid bruits Abdomen: soft, NT, no masses; aortic pulse is not palpable Skin: without rashes Vascular Exam/Pulses:  Right Left  Radial 2+ (normal) 2+ (normal)   Extremities: without ischemic changes, without Gangrene , without cellulitis; without open wounds;  Musculoskeletal: no muscle wasting or atrophy  Neurologic: A&O X 3; moving all extremities equally Psychiatric:  The pt has Normal affect.   Non-Invasive Vascular Imaging:   Carotid Duplex on 12/09/2019: Right:  No evidence of stenosis Left:  1-39% ICA stenosis Vertebrals: Bilateral vertebral arteries demonstrate antegrade flow.  Previous Carotid duplex on 07/22/2018: Right: no evidence of stenosis Left:   1-39% ICA stenosis    ASSESSMENT/PLAN:: 74 y.o.  male here for follow up carotid artery stenosis.  -duplex today reveals no evidence of restenosis of the right ICA and left ICA is 1-39%.  He remains asymptomatic -discussed s/s of stroke with pt and he understands should he develop any of these sx, he will go to the nearest ER. -hypertension:  His blood pressure is elevated today, which is higher than normal.  Rechecked it again before he left and it remained elevated.  Suggested he follow up with Dr. Yong Channel if this continues.  Most likely elevated with white coat syndrome.  He checks his blood pressure regularly and he has had his cuff checked at Dr. Ansel Bong office.   He expressed understanding. -pt will f/u in one year with carotid duplex -pt will call sooner should they have any issues.   Leontine Locket, [facility] [phone]  Clinic MD:  Scot Dock

## 2020-01-25 DIAGNOSIS — H04123 Dry eye syndrome of bilateral lacrimal glands: Secondary | ICD-10-CM | POA: Diagnosis not present

## 2020-01-25 DIAGNOSIS — H2513 Age-related nuclear cataract, bilateral: Secondary | ICD-10-CM | POA: Diagnosis not present

## 2020-01-28 ENCOUNTER — Other Ambulatory Visit: Payer: Medicare Other

## 2020-02-09 ENCOUNTER — Ambulatory Visit: Payer: Medicare Other | Admitting: Family Medicine

## 2020-02-16 NOTE — Patient Instructions (Addendum)
Please stop by lab before you go- blood and urine If you have mychart- we will send your results within 3 business days of [place] receiving them.  If you do not have mychart- we will call you about results within 5 business days of [place] receiving them.  *please also note that you will see labs on mychart as soon as they post. I will later go in and write notes on them- will say "notes from Dr. Yong Channel"    Also pick up stool cards from labs  Glad you are doing so well

## 2020-02-16 NOTE — Progress Notes (Signed)
Phone [phone] In person visit   Subjective:   Ricky [name]. is a 75 y.o. year old very pleasant male patient who presents for/with See problem oriented charting Chief Complaint  Patient presents with  . Hypertension  . Hyperlipidemia  . Hyperglycemia   This visit occurred during the SARS-CoV-2 public health emergency.  Safety protocols were in place, including screening questions prior to the visit, additional usage of staff PPE, and extensive cleaning of exam room while observing appropriate contact time as indicated for disinfecting solutions.   Past Medical History-  Patient Active Problem List   Diagnosis Date Noted  . Hemiparesis affecting dominant side as late effect of stroke ([place]) 03/08/2016    Priority: High  . History of CVA (cerebrovascular accident) 02/28/2016    Priority: High  . Carotid artery stenosis, symptomatic, right 03/19/2016    Priority: Medium  . Hyperlipidemia 03/08/2016    Priority: Medium  . Essential hypertension 12/20/2014    Priority: Medium  . Trigger middle finger of left hand 11/12/2016    Priority: Low  . OA (osteoarthritis) of knee 12/14/2013    Priority: Low  . Leukopenia 01/08/2017    Medications- reviewed and updated Current Outpatient Medications  Medication Sig Dispense Refill  . acetaminophen (TYLENOL) 500 MG tablet Take 500-1,000 mg by mouth every 6 (six) hours as needed (for pain.).     Marland Kitchen amLODipine (NORVASC) 5 MG tablet TAKE 1 TABLET BY MOUTH EVERY DAY 90 tablet 3  . aspirin EC 325 MG tablet Take 1 tablet (325 mg total) by mouth daily. 30 tablet 0  . atorvastatin (LIPITOR) 20 MG tablet TAKE 1 TABLET (20 MG TOTAL) BY MOUTH EVERY OTHER DAY. 46 tablet 1  . Multiple Vitamin (MULTIVITAMIN WITH MINERALS) TABS tablet Take 1 tablet by mouth daily.     No current facility-administered medications for this visit.     Objective:  BP (!) 148/86   Pulse (!) 52   Temp 97.8 F (36.6 C) (Temporal)   Ht 5\' 5"  (1.651 m)   Wt 138  lb 12.8 oz (63 kg)   SpO2 99%   BMI 23.10 kg/m  Gen: NAD, resting comfortably CV: RRR no murmurs rubs or gallops Lungs: CTAB no crackles, wheeze, rhonchi Abdomen: soft/nontender/nondistended/normal bowel sounds. No rebound or guarding.  Ext: no edema Skin: warm, dry     Assessment and Plan   #hypertension S: medication: Amlodipine 5 mg Home readings #s: over last 10 readings average . May get high readings but comes back down- we included highs in our average avg 130.7 83.1   Exercise-at least 3-4 days a week  Low-salt diet-tries to limit salt intake  BP Readings from Last 3 Encounters:  02/17/20 (!) 148/90--> 148/86 on repeat  12/09/19 (!) 184/85 vascular and did not recheck- states he was stressed  10/01/19 120/82  A/P: white coat hypertension element I believe with excellent home control- we will continue current medicine  #hyperlipidemia with history of CVA and patient with carotid stenosis of right side status post endarterectomy on the right and hemiparesis affecting dominant side as a result  S: Medication:Atorvastatin 20 mg daily every other day, aspirin 325 mg  Patient continues to occasionally drop things in his left hand.  He is left-handed. Not owrse overall Lab Results  Component Value Date   CHOL 114 01/26/2019   HDL 57.30 01/26/2019   LDLCALC 48 01/26/2019   LDLDIRECT 44.0 08/03/2019   TRIG 41.0 01/26/2019   CHOLHDL 2 01/26/2019   A/P:  Lipids well controlled on last check with LDL under 70-update full lipid panel today.  Hemiparesis noted/stable.  History of carotid stenosis status post endarterectomy-followed last on-12/09/2019 by Dr. Oneida Alar- no stenosis noted-only 1 to 39% in the left- continue risk factor modification with lipid and blood pressure control and prevention of diabetes  # Hyperglycemia/insulin resistance/prediabetes S:  Medication: None Exercise and diet- see above Lab Results  Component Value Date   HGBA1C 5.7 08/03/2019   HGBA1C 5.4  02/29/2016   A/P: hopefully stable- update with labs today   #Mild leukopenia-other cell lines were normal until last visit when he had slight anemia as well as slightly low platelets.Plan at last visit was CBC with differential as well as pathology smear review within 1 to 2 weeks- it does not appear my team confirmed by phone but only noted patient had read my chart message-I apologized to patient today-we will update these Lab Results  Component Value Date   WBC 3.1 (L) 08/03/2019   HGB 12.0 (L) 08/03/2019   HCT 34.7 (L) 08/03/2019   MCV 96.8 08/03/2019   PLT 141.0 (L) 08/03/2019   # Left knee pain- intermittent issues- tweaked recently but has calmed back down- plans to see Dr. Milford Cage PA  #nocturia- we did not get urine culture after last visit as planned- ordered today  Recommended follow up: 33-month follow-up Future Appointments  Date Time Provider [place]  10/13/2020  8:00 AM [facility]    Lab/Order associations:   ICD-10-CM   1. Hyperlipidemia, unspecified hyperlipidemia type  E78.5 CBC with Differential/Platelet    Comprehensive metabolic panel    Lipid panel  2. Hyperglycemia  R73.9 Hemoglobin A1c  3. Leukopenia, unspecified type  D72.819 Pathologist smear review  4. Essential hypertension  I10   5. Hemiparesis affecting dominant side as late effect of stroke ([place])  I69.359   6. History of CVA (cerebrovascular accident)  Z86.73   7. Carotid artery stenosis, symptomatic, right  I65.21   8. Nocturia  R35.1 Urine Culture  9. Anemia, unspecified type  D64.9 Fecal occult blood, imunochemical    No orders of the defined types were placed in this encounter.  Return precautions advised.  Garret Reddish, MD

## 2020-02-17 ENCOUNTER — Other Ambulatory Visit: Payer: Self-pay

## 2020-02-17 ENCOUNTER — Encounter: Payer: Self-pay | Admitting: Family Medicine

## 2020-02-17 ENCOUNTER — Ambulatory Visit (INDEPENDENT_AMBULATORY_CARE_PROVIDER_SITE_OTHER): Payer: Medicare Other | Admitting: Family Medicine

## 2020-02-17 VITALS — BP 148/86 | HR 52 | Temp 97.8°F | Ht 65.0 in | Wt 138.8 lb

## 2020-02-17 DIAGNOSIS — D72819 Decreased white blood cell count, unspecified: Secondary | ICD-10-CM | POA: Diagnosis not present

## 2020-02-17 DIAGNOSIS — I10 Essential (primary) hypertension: Secondary | ICD-10-CM

## 2020-02-17 DIAGNOSIS — Z8673 Personal history of transient ischemic attack (TIA), and cerebral infarction without residual deficits: Secondary | ICD-10-CM

## 2020-02-17 DIAGNOSIS — I69359 Hemiplegia and hemiparesis following cerebral infarction affecting unspecified side: Secondary | ICD-10-CM

## 2020-02-17 DIAGNOSIS — E785 Hyperlipidemia, unspecified: Secondary | ICD-10-CM | POA: Diagnosis not present

## 2020-02-17 DIAGNOSIS — R351 Nocturia: Secondary | ICD-10-CM

## 2020-02-17 DIAGNOSIS — I6521 Occlusion and stenosis of right carotid artery: Secondary | ICD-10-CM

## 2020-02-17 DIAGNOSIS — R739 Hyperglycemia, unspecified: Secondary | ICD-10-CM

## 2020-02-17 DIAGNOSIS — D649 Anemia, unspecified: Secondary | ICD-10-CM

## 2020-02-17 LAB — CBC WITH DIFFERENTIAL/PLATELET
Basophils Absolute: 0 10*3/uL (ref 0.0–0.1)
Basophils Relative: 0.7 % (ref 0.0–3.0)
Eosinophils Absolute: 0.1 10*3/uL (ref 0.0–0.7)
Eosinophils Relative: 3.6 % (ref 0.0–5.0)
HCT: 40.6 % (ref 39.0–52.0)
Hemoglobin: 13.6 g/dL (ref 13.0–17.0)
Lymphocytes Relative: 27.9 % (ref 12.0–46.0)
Lymphs Abs: 0.9 10*3/uL (ref 0.7–4.0)
MCHC: 33.6 g/dL (ref 30.0–36.0)
MCV: 95.1 fl (ref 78.0–100.0)
Monocytes Absolute: 0.3 10*3/uL (ref 0.1–1.0)
Monocytes Relative: 10.3 % (ref 3.0–12.0)
Neutro Abs: 1.8 10*3/uL (ref 1.4–7.7)
Neutrophils Relative %: 57.5 % (ref 43.0–77.0)
Platelets: 229 10*3/uL (ref 150.0–400.0)
RBC: 4.27 Mil/uL (ref 4.22–5.81)
RDW: 13.7 % (ref 11.5–15.5)
WBC: 3.2 10*3/uL — ABNORMAL LOW (ref 4.0–10.5)

## 2020-02-17 LAB — COMPREHENSIVE METABOLIC PANEL
ALT: 21 U/L (ref 0–53)
AST: 25 U/L (ref 0–37)
Albumin: 4.2 g/dL (ref 3.5–5.2)
Alkaline Phosphatase: 56 U/L (ref 39–117)
BUN: 21 mg/dL (ref 6–23)
CO2: 29 mEq/L (ref 19–32)
Calcium: 9.2 mg/dL (ref 8.4–10.5)
Chloride: 105 mEq/L (ref 96–112)
Creatinine, Ser: 0.77 mg/dL (ref 0.40–1.50)
GFR: 87.14 mL/min (ref >60.00)
Glucose, Bld: 95 mg/dL (ref 70–99)
Potassium: 4.5 mEq/L (ref 3.5–5.1)
Sodium: 139 mEq/L (ref 135–145)
Total Bilirubin: 0.8 mg/dL (ref 0.2–1.2)
Total Protein: 6.5 g/dL (ref 6.0–8.3)

## 2020-02-17 LAB — LIPID PANEL
Cholesterol: 112 mg/dL (ref 0–200)
HDL: 64.4 mg/dL (ref >39.00)
LDL Cholesterol: 40 mg/dL (ref 0–99)
NonHDL: 47.91
Total CHOL/HDL Ratio: 2
Triglycerides: 40 mg/dL (ref 0.0–149.0)
VLDL: 8 mg/dL (ref 0.0–40.0)

## 2020-02-17 LAB — HEMOGLOBIN A1C: Hgb A1c MFr Bld: 5.9 % (ref 4.6–6.5)

## 2020-02-19 LAB — URINE CULTURE
MICRO NUMBER:: 11409599
SPECIMEN QUALITY:: ADEQUATE

## 2020-02-19 LAB — PATHOLOGIST SMEAR REVIEW

## 2020-02-20 ENCOUNTER — Other Ambulatory Visit: Payer: Self-pay | Admitting: Family Medicine

## 2020-02-20 MED ORDER — CEPHALEXIN 500 MG PO CAPS: 500.0000 mg | ORAL_CAPSULE | Freq: Three times a day (TID) | ORAL | 0 refills | Status: AC

## 2020-02-26 DIAGNOSIS — Z96652 Presence of left artificial knee joint: Secondary | ICD-10-CM | POA: Diagnosis not present

## 2020-02-28 ENCOUNTER — Other Ambulatory Visit: Payer: Self-pay | Admitting: Family Medicine

## 2020-05-08 DIAGNOSIS — Z23 Encounter for immunization: Secondary | ICD-10-CM | POA: Diagnosis not present

## 2020-05-26 DIAGNOSIS — U071 COVID-19: Secondary | ICD-10-CM | POA: Diagnosis not present

## 2020-06-22 ENCOUNTER — Other Ambulatory Visit: Payer: Self-pay | Admitting: Family Medicine

## 2020-08-16 ENCOUNTER — Telehealth: Payer: Self-pay

## 2020-08-16 ENCOUNTER — Other Ambulatory Visit: Payer: Self-pay

## 2020-08-16 ENCOUNTER — Ambulatory Visit (INDEPENDENT_AMBULATORY_CARE_PROVIDER_SITE_OTHER): Payer: Medicare Other | Admitting: Family Medicine

## 2020-08-16 ENCOUNTER — Encounter: Payer: Self-pay | Admitting: Family Medicine

## 2020-08-16 VITALS — BP 127/84 | HR 54 | Temp 97.6°F | Ht 65.0 in | Wt 137.6 lb

## 2020-08-16 DIAGNOSIS — E785 Hyperlipidemia, unspecified: Secondary | ICD-10-CM | POA: Diagnosis not present

## 2020-08-16 DIAGNOSIS — R739 Hyperglycemia, unspecified: Secondary | ICD-10-CM | POA: Diagnosis not present

## 2020-08-16 DIAGNOSIS — I69359 Hemiplegia and hemiparesis following cerebral infarction affecting unspecified side: Secondary | ICD-10-CM

## 2020-08-16 DIAGNOSIS — I6521 Occlusion and stenosis of right carotid artery: Secondary | ICD-10-CM | POA: Diagnosis not present

## 2020-08-16 DIAGNOSIS — I10 Essential (primary) hypertension: Secondary | ICD-10-CM | POA: Diagnosis not present

## 2020-08-16 LAB — CBC WITH DIFFERENTIAL/PLATELET
Basophils Absolute: 0 10*3/uL (ref 0.0–0.1)
Basophils Relative: 0.5 % (ref 0.0–3.0)
Eosinophils Absolute: 0.1 10*3/uL (ref 0.0–0.7)
Eosinophils Relative: 4.8 % (ref 0.0–5.0)
HCT: 39 % (ref 39.0–52.0)
Hemoglobin: 13.4 g/dL (ref 13.0–17.0)
Lymphocytes Relative: 30 % (ref 12.0–46.0)
Lymphs Abs: 0.9 10*3/uL (ref 0.7–4.0)
MCHC: 34.4 g/dL (ref 30.0–36.0)
MCV: 95 fl (ref 78.0–100.0)
Monocytes Absolute: 0.3 10*3/uL (ref 0.1–1.0)
Monocytes Relative: 10.9 % (ref 3.0–12.0)
Neutro Abs: 1.6 10*3/uL (ref 1.4–7.7)
Neutrophils Relative %: 53.8 % (ref 43.0–77.0)
Platelets: 223 10*3/uL (ref 150.0–400.0)
RBC: 4.1 Mil/uL — ABNORMAL LOW (ref 4.22–5.81)
RDW: 14 % (ref 11.5–15.5)
WBC: 2.9 10*3/uL — ABNORMAL LOW (ref 4.0–10.5)

## 2020-08-16 LAB — COMPREHENSIVE METABOLIC PANEL
ALT: 21 U/L (ref 0–53)
AST: 29 U/L (ref 0–37)
Albumin: 4.2 g/dL (ref 3.5–5.2)
Alkaline Phosphatase: 56 U/L (ref 39–117)
BUN: 21 mg/dL (ref 6–23)
CO2: 27 mEq/L (ref 19–32)
Calcium: 9.3 mg/dL (ref 8.4–10.5)
Chloride: 105 mEq/L (ref 96–112)
Creatinine, Ser: 0.81 mg/dL (ref 0.40–1.50)
GFR: 85.52 mL/min (ref >60.00)
Glucose, Bld: 89 mg/dL (ref 70–99)
Potassium: 4.1 mEq/L (ref 3.5–5.1)
Sodium: 140 mEq/L (ref 135–145)
Total Bilirubin: 0.8 mg/dL (ref 0.2–1.2)
Total Protein: 6.7 g/dL (ref 6.0–8.3)

## 2020-08-16 LAB — HEMOGLOBIN A1C: Hgb A1c MFr Bld: 5.7 % (ref 4.6–6.5)

## 2020-08-16 NOTE — Telephone Encounter (Signed)
Covid booster documented

## 2020-08-16 NOTE — Telephone Encounter (Signed)
Patient called in and stated he received his 4th covid pfizer shot on 05/08/20.

## 2020-08-16 NOTE — Patient Instructions (Addendum)
Please stop by lab before you go If you have mychart- we will send your results within 3 business days of [place] receiving them.  If you do not have mychart- we will call you about results within 5 business days of [place] receiving them.  *please also note that you will see labs on mychart as soon as they post. I will later go in and write notes on them- will say "notes from Dr. Yong Channel"  Please schedule a visit with your orthopedist in regards to your ongoing back pain.  See below but it appears you have had 4 covid immunizations and are up to date- if you have not had the 4th then you can certainly do so bu tlet [place] know so we can delete the 05/08/20 date Immunization History  Administered Date(s) Administered   Influenza, High Dose Seasonal PF 01/10/2016, 11/12/2016, 12/10/2017, 09/28/2018, 10/18/2019   Influenza,inj,Quad PF,6+ Mos 11/30/2013, 12/20/2014   PFIZER Comirnaty(Gray Top)Covid-19 Tri-Sucrose Vaccine 05/08/2020   PFIZER(Purple Top)SARS-COV-2 Vaccination 02/16/2019, 03/07/2019, 11/11/2019   Pneumococcal Conjugate-13 11/30/2013   Pneumococcal Polysaccharide-23 12/20/2014   Td 04/20/2014   Zoster Recombinat (Shingrix) 09/27/2018, 12/11/2018    Recommended follow up: Return in about 6 months (around 02/16/2021) for follow up- or sooner if needed.

## 2020-08-16 NOTE — Progress Notes (Signed)
Phone [phone] In person visit   Subjective:   Ricky [name]. is a 75 y.o. year old very pleasant male patient who presents for/with See problem oriented charting Chief Complaint  Patient presents with   Hypertension   Hyperlipidemia   Hyperglycemia    This visit occurred during the SARS-CoV-2 public health emergency.  Safety protocols were in place, including screening questions prior to the visit, additional usage of staff PPE, and extensive cleaning of exam room while observing appropriate contact time as indicated for disinfecting solutions.   Past Medical History-  Patient Active Problem List   Diagnosis Date Noted   Hemiparesis affecting dominant side as late effect of stroke ([place]) 03/08/2016    Priority: High   History of CVA (cerebrovascular accident) 02/28/2016    Priority: High   Carotid artery stenosis, symptomatic, right 03/19/2016    Priority: Medium   Hyperlipidemia 03/08/2016    Priority: Medium   Essential hypertension 12/20/2014    Priority: Medium   Leukopenia 01/08/2017    Priority: Low   Trigger middle finger of left hand 11/12/2016    Priority: Low   OA (osteoarthritis) of knee 12/14/2013    Priority: Low    Medications- reviewed and updated Current Outpatient Medications  Medication Sig Dispense Refill   acetaminophen (TYLENOL) 500 MG tablet Take 500-1,000 mg by mouth every 6 (six) hours as needed (for pain.).      amLODipine (NORVASC) 5 MG tablet TAKE 1 TABLET BY MOUTH EVERY DAY 90 tablet 3   aspirin EC 325 MG tablet Take 1 tablet (325 mg total) by mouth daily. 30 tablet 0   atorvastatin (LIPITOR) 20 MG tablet TAKE 1 TABLET BY MOUTH EVERY OTHER DAY 45 tablet 2   Multiple Vitamin (MULTIVITAMIN WITH MINERALS) TABS tablet Take 1 tablet by mouth daily.     No current facility-administered medications for this visit.     Objective:  BP 127/84 Comment: most recent home reading from 08/15/20  Pulse (!) 54   Temp 97.6 F (36.4 C) (Temporal)    Ht 5\' 5"  (1.651 m)   Wt 137 lb 9.6 oz (62.4 kg)   SpO2 99%   BMI 22.90 kg/m  Gen: NAD, resting comfortably CV: RRR no murmurs rubs or gallops Lungs: CTAB no crackles, wheeze, rhonchi Abdomen: soft/nontender/nondistended/normal bowel sounds.  Ext: no edema Skin: warm, dry    Assessment and Plan   #[place]- enjoyed this  #hypertension S: medication: Amlodipine 5 mg Home readings #s: has home cuff - sometimes #s high but trends down on repeat with rest. - most recent reading 127/84. Certainly averaging <010/27 often 120s over 80s Exercise-at least 3-4 days a week typically (prior 5) - gym closed Friday afternoon and the weekend due to staffing making things harder Low-salt diet-tries to limit salt intake - thinks could be more careful BP Readings from Last 3 Encounters:  08/16/20 127/84 most recent home readings  02/17/20 (!) 148/86  12/09/19 (!) 184/85  A/P: Stable. Continue current medications.   # history of CVA with hemiparesis affecting dominant side (left) as a result  # carotid stenosis- carotid stenosis of right side status post endarterectomy on the right with 1-39% on the left followed by Dr. Oneida Alar.  Last duplex 12/09/2019 with no evidence of restenosis on the right and 1 to 39% stenosis on the left #hyperlipidemia - LDL goal below 70 S: Medication:Atorvastatin 20 mg  every other day, aspirin 325 mg daily - occasionally  drops objects with left hand (left handed) - some days worse than others Lab Results  Component Value Date   CHOL 112 02/17/2020   HDL 64.40 02/17/2020   LDLCALC 40 02/17/2020   LDLDIRECT 44.0 08/03/2019   TRIG 40.0 02/17/2020   CHOLHDL 2 02/17/2020  A/P: Excellent control on last check of cholesterol-continue current medications.  For carotid stenosis-continue follow-up with Dr. Su Monks surgery.  Left-sided hemiparesis stable   # Hyperglycemia/insulin resistance/prediabetes- peak a1c 103  08/03/19 S: Medication:  None Exercise and diet- see above   Lab Results  Component Value Date   HGBA1C 5.9 02/17/2020   HGBA1C 5.7 08/03/2019   HGBA1C 5.4 02/29/2016  A/P: hopefully stable- update a1c today. Continue  without meds for now    #Mild leukopenia-other cell lines were normal until last visit when he had slight anemia as well as slightly low platelets.Plan at last visit was CBC with differential as well as pathology smear review - largely reassuring "Leukopenia. Myeloid population consists predominantly of mature  segmented neutrophils with mild reactive changes.  No immature cells are identified. Review of the peripheral smear  reveals  adequate numbers of platelets.  RBCs appear to be microcytic and hypochromic on smear  review. Suggest evaluation for iron deficiency, if  clinically indicated. " Lab Results  Component Value Date   WBC 3.2 (L) 02/17/2020   HGB 13.6 02/17/2020   HCT 40.6 02/17/2020   MCV 95.1 02/17/2020   PLT 229.0 02/17/2020   # Left knee pain- Follows with Dr. Elmyra Ricks. intermittent issues- did have a fall on this and had it checked out by ortho- reassuring results -Also had trigger finger in the past-finger splint was helpful- still wears it- rare issues   #nocturia- we did a later PSA then typically would due to worsening issues but thankfully low risk PSA trend. Nitrites in urine- ordered culture 02/17/20-UTI was found and treated-  overall stable even after treatment Lab Results  Component Value Date   PSA 0.87 08/03/2019   PSA 1.22 11/30/2013    #low back pain- continues to bother him. Mostly able to work through it. Advised follow up with ortho visits  Recommended follow up: No follow-ups on file. Future Appointments  Date Time Provider [place]  10/13/2020  8:00 AM [facility]    Lab/Order associations:   ICD-10-CM   1. Hyperlipidemia, unspecified hyperlipidemia type  E78.5 Comprehensive metabolic panel     CBC with Differential/Platelet    2. Essential hypertension  I10 Comprehensive metabolic panel    CBC with Differential/Platelet    3. Carotid artery stenosis, symptomatic, right  I65.21     4. Hemiparesis affecting dominant side as late effect of stroke ([place])  I69.359     5. Hyperglycemia  R73.9 Hemoglobin A1c      I,Harris Phan,acting as a scribe for Garret Reddish, MD.,have documented all relevant documentation on the behalf of Garret Reddish, MD,as directed by  Garret Reddish, MD while in the presence of Garret Reddish, MD.   I, Garret Reddish, MD, have reviewed all documentation for this visit. The documentation on 08/16/20 for the exam, diagnosis, procedures, and orders are all accurate and complete.   Return precautions advised.  Garret Reddish, MD

## 2020-09-27 ENCOUNTER — Encounter: Payer: Self-pay | Admitting: Family Medicine

## 2020-09-27 DIAGNOSIS — L819 Disorder of pigmentation, unspecified: Secondary | ICD-10-CM | POA: Diagnosis not present

## 2020-09-27 DIAGNOSIS — L814 Other melanin hyperpigmentation: Secondary | ICD-10-CM | POA: Diagnosis not present

## 2020-09-27 DIAGNOSIS — B351 Tinea unguium: Secondary | ICD-10-CM | POA: Diagnosis not present

## 2020-09-27 DIAGNOSIS — L821 Other seborrheic keratosis: Secondary | ICD-10-CM | POA: Diagnosis not present

## 2020-09-27 DIAGNOSIS — L570 Actinic keratosis: Secondary | ICD-10-CM | POA: Diagnosis not present

## 2020-09-27 DIAGNOSIS — Z872 Personal history of diseases of the skin and subcutaneous tissue: Secondary | ICD-10-CM | POA: Diagnosis not present

## 2020-09-27 DIAGNOSIS — C44722 Squamous cell carcinoma of skin of right lower limb, including hip: Secondary | ICD-10-CM | POA: Diagnosis not present

## 2020-09-27 DIAGNOSIS — D1801 Hemangioma of skin and subcutaneous tissue: Secondary | ICD-10-CM | POA: Diagnosis not present

## 2020-09-27 DIAGNOSIS — Z85828 Personal history of other malignant neoplasm of skin: Secondary | ICD-10-CM | POA: Diagnosis not present

## 2020-09-27 DIAGNOSIS — D485 Neoplasm of uncertain behavior of skin: Secondary | ICD-10-CM | POA: Diagnosis not present

## 2020-09-27 DIAGNOSIS — L812 Freckles: Secondary | ICD-10-CM | POA: Diagnosis not present

## 2020-09-27 DIAGNOSIS — L905 Scar conditions and fibrosis of skin: Secondary | ICD-10-CM | POA: Diagnosis not present

## 2020-09-27 DIAGNOSIS — C44519 Basal cell carcinoma of skin of other part of trunk: Secondary | ICD-10-CM | POA: Diagnosis not present

## 2020-10-12 ENCOUNTER — Other Ambulatory Visit: Payer: Self-pay | Admitting: Family Medicine

## 2020-10-13 ENCOUNTER — Ambulatory Visit: Payer: Medicare Other

## 2020-10-21 ENCOUNTER — Ambulatory Visit: Payer: Medicare Other

## 2020-10-26 ENCOUNTER — Telehealth: Payer: Self-pay

## 2020-10-26 NOTE — Telephone Encounter (Signed)
Will discuss at visit tomorrow- if has new or worsening symptoms should seek care. Make sure he is tested for covid prior to visit if possible

## 2020-10-26 NOTE — Telephone Encounter (Signed)
  Patient is scheduled for 10/27/20 at [place] states he is having problems when he is deep breathing. It takes more effort than he would like. It started last week. Over the last several days he sneezed a few times & had a couple coughs. He is a little tired. Does the patient have any new or worsening symptoms? ---Yes Will a triage be completed? ---Yes Related visit to physician within the last 2 weeks? ---No Does the PT have any chronic conditions? (i.e. diabetes, asthma, this includes High risk factors for pregnancy, etc.) ---Yes List chronic conditions. ---CVA, skin cancer Is this a behavioral health or substance abuse call? ---No Guidelines Guideline Title Affirmed Question Affirmed Notes Nurse Date/Time (Eastern Time) COVID-19 - Diagnosed or Suspected [1] HIGH RISK for severe COVID complications (e.g., weak immune system, age > 41 years, obesity with BMI 30 or higher, pregnant, chronic Lovelace, RN, Amy 10/26/2020 1:36:08 PM PLEASE NOTE: All timestamps contained within this report are represented as [place] Standard Time. CONFIDENTIALTY NOTICE: This fax transmission is intended only for the addressee. It contains information that is legally privileged, confidential or otherwise protected from use or disclosure. If you are not the intended recipient, you are strictly prohibited from reviewing, disclosing, copying using or disseminating any of this information or taking any action in reliance on or regarding this information. If you have received this fax in error, please notify us immediately by telephone so that we can arrange for its return to [place]. Phone: [phone], Toll-Free: [phone], Fax: [phone] Page: 2 of 2 Call Id: [id number] Guidelines Guideline Title Affirmed Question Affirmed Notes Nurse Date/Time Eilene Ghazi Time) lung disease or other chronic medical condition) AND [2] COVID symptoms (e.g., cough, fever) (Exceptions: Already seen by PCP  and no new or worsening symptoms.) Disp. Time Eilene Ghazi Time) Disposition Final User 10/26/2020 1:31:10 PM Send to Urgent Queue Silvano Rusk 10/26/2020 1:38:25 PM Call PCP within 24 Hours Yes Lovelace, RN, Amy Caller Disagree/Comply Comply Caller Understands Yes PreDisposition InappropriateToAsk Care Advice Given Per Guideline CALL PCP WITHIN 24 HOURS: * You need to discuss this with your doctor (or NP/PA) within the next 24 hours. * IF OFFICE WILL BE OPEN: Call the office when it opens tomorrow morning. CALL BACK IF: * You become worse CARE ADVICE given per COVID-19 - DIAGNOSED OR SUSPECTED (Adult) guideline. Comments User: Wayne Sever, RN Date/Time Eilene Ghazi Time): 10/26/2020 1:39:05 PM Caller states he already has an appt. for 1 PM tomorrow

## 2020-10-26 NOTE — Telephone Encounter (Signed)
FYI

## 2020-10-27 ENCOUNTER — Ambulatory Visit (HOSPITAL_COMMUNITY)
Admission: RE | Admit: 2020-10-27 | Discharge: 2020-10-27 | Disposition: A | Discharge status: Home / Self Care | Payer: Medicare Other | Source: Ambulatory Visit | Attending: Family Medicine | Admitting: Family Medicine

## 2020-10-27 ENCOUNTER — Ambulatory Visit (INDEPENDENT_AMBULATORY_CARE_PROVIDER_SITE_OTHER): Payer: Medicare Other | Admitting: Family Medicine

## 2020-10-27 ENCOUNTER — Ambulatory Visit: Payer: Medicare Other

## 2020-10-27 ENCOUNTER — Encounter: Payer: Self-pay | Admitting: Family Medicine

## 2020-10-27 ENCOUNTER — Other Ambulatory Visit: Payer: Self-pay

## 2020-10-27 VITALS — BP 146/82 | HR 60 | Temp 98.3°F | Ht 65.0 in | Wt 136.6 lb

## 2020-10-27 DIAGNOSIS — R059 Cough, unspecified: Secondary | ICD-10-CM

## 2020-10-27 DIAGNOSIS — I6521 Occlusion and stenosis of right carotid artery: Secondary | ICD-10-CM | POA: Diagnosis not present

## 2020-10-27 DIAGNOSIS — R0602 Shortness of breath: Secondary | ICD-10-CM

## 2020-10-27 DIAGNOSIS — I10 Essential (primary) hypertension: Secondary | ICD-10-CM | POA: Diagnosis not present

## 2020-10-27 DIAGNOSIS — J439 Emphysema, unspecified: Secondary | ICD-10-CM | POA: Diagnosis not present

## 2020-10-27 NOTE — Patient Instructions (Addendum)
Health Maintenance Due  Topic Date Due   INFLUENZA VACCINE  We will not administer today until we further understand your symptoms. 09/05/2020    I recommend home quarantining until results from the X COVID test come back come back.  If you have new or worsening symptoms such as increasing shortness of breath or chest tightness or new leg swelling or calf pain please seek care immediately.  Consider using a pulse oximeter or purchasing one and if oxygen levels drop below 94% please seek care.  Start 1 1/2 tablet of your Amlodipine - 7.5 mg . I recommend you take this medication in the morning - after 2 hours of taking the medication please check your blood pressure-make sure to be seated for at least 5 minutes and do not do this after exercise. Once you note pressure dropping back below 120, you can go back to 1 tab or 5 mg. -being more aggressive due to stroke history  EKG largely stable  Pt can go over to [facility] in [place] Entrance to get x-ray. Doors close at 5 for walk in patients.   Recommended follow up: Return for as needed for new, worsening, persistent symptoms.

## 2020-10-27 NOTE — Progress Notes (Signed)
Phone [phone] In person visit   Subjective:   Ricky [name]. is a 75 y.o. year old very pleasant male patient who presents for/with See problem oriented charting Chief Complaint  Patient presents with   Breathing Problem    Patient states that when he tries to take a deep breath it's hard. Tightness in his chest. 1 week     This visit occurred during the SARS-CoV-2 public health emergency.  Safety protocols were in place, including screening questions prior to the visit, additional usage of staff PPE, and extensive cleaning of exam room while observing appropriate contact time as indicated for disinfecting solutions.   Past Medical History-  Patient Active Problem List   Diagnosis Date Noted   Hemiparesis affecting dominant side as late effect of stroke ([place]) 03/08/2016    Priority: High   History of CVA (cerebrovascular accident) 02/28/2016    Priority: High   Carotid artery stenosis, symptomatic, right 03/19/2016    Priority: Medium   Hyperlipidemia 03/08/2016    Priority: Medium   Essential hypertension 12/20/2014    Priority: Medium   Leukopenia 01/08/2017    Priority: Low   Trigger middle finger of left hand 11/12/2016    Priority: Low   OA (osteoarthritis) of knee 12/14/2013    Priority: Low    Medications- reviewed and updated Current Outpatient Medications  Medication Sig Dispense Refill   acetaminophen (TYLENOL) 500 MG tablet Take 500-1,000 mg by mouth every 6 (six) hours as needed (for pain.).      amLODipine (NORVASC) 5 MG tablet TAKE 1 TABLET BY MOUTH EVERY DAY 90 tablet 3   aspirin EC 325 MG tablet Take 1 tablet (325 mg total) by mouth daily. 30 tablet 0   atorvastatin (LIPITOR) 20 MG tablet TAKE 1 TABLET BY MOUTH EVERY OTHER DAY 45 tablet 2   Multiple Vitamin (MULTIVITAMIN WITH MINERALS) TABS tablet Take 1 tablet by mouth daily.     No current facility-administered medications for this visit.     Objective:  BP (!) 146/82   Pulse 60   Temp  98.3 F (36.8 C) (Temporal)   Ht 5\' 5"  (1.651 m)   Wt 136 lb 9.6 oz (62 kg)   SpO2 98%   BMI 22.73 kg/m  Gen: NAD, resting comfortably CV: RRR no murmurs rubs or gallops Lungs: CTAB no crackles, wheeze, rhonchi.  Appears slightly uncomfortable with deep breaths Abdomen: soft/nontender/nondistended/normal bowel sounds. No rebound or guarding.  Ext: trace edema but bilateral Skin: warm, dry Neuro: grossly normal, moves all extremities    EKG: sinus bradycardia with rate 50, normal axis, normal intervals, no hypertrophy, flattening of T waves in 3 and aVF appears largely stable from 11/30/2013 EKG.  Overall appears low voltage still.  No other ST or T wave changes noted     Assessment and Plan  #Mild intermittent cough/mild shortness of breath/primarily difficulty with taking a deep breath-similar to prior pneumonias per patient S:Patient reports today that he is experiencing issues with deep breaths- difficult to take a deep breath- comes and goes as far as difficulty. Still able to go to the gym- but notes has difficulty taking deep breaths- may take several days to do some. Yesterday was more fatigued than normal at the gym. Has noted higher blood pressure at home. Felt similarly when had pneumonia. Sensation of difficulty with deep breaths possibly worse with exercise. Occasional cough with this. Has not been tested for covid- minimal phlegm. Has home tests but hasnt tried yet.  Onset started about a week ago- had company over weekend so somewhat lost track of this. Denies fever. No chest pain- some sensation of tightness in not taking deep breaths. No headache or blurry vision. No chills. Minimal to no nasal congestion. More fatiued than normal. No headache or blurry vision. No nausea, vomiting, diarrhea, or new loss of taste or smell. No known contacts with covid 19 or someone being tested for covid 19.  A/P: 75 year old male with history of stroke, hypertension, hyperlipidemia  presenting with primarily difficulty taking a deep breath and a chest tightness sensation when he does so that waxes and wanes similar to prior pneumonias-we will plan to get a chest x-ray likely at [facility] long -Good oxygen saturations and no tachycardia-do not suspect pulmonary embolism-so we discussed that he has worsening symptoms should seek care immediately.  No unilateral leg swelling or calf pain to suggest DVT as origin - Do not strongly suspect cardiac origin of pain but does have some risk factors-once again discussed if worsening symptoms should seek care.  EKG today largely reassuring and stable from 2015 examination.  Although I do not think this is cardiac we will consider cardiology referral if no pneumonia on chest x-ray - check for Covid- outside of x-ray today recommended quarantine as best as possible until result back  #hypertension S: medication: Amlodipine 5 mg Home readings #s: has home cuff - home readings up to [phone]/56-81 in last week- certainly up from prior readings averaging less than 135/85 - for instance was 110/73 on 9/12 before issues started Low-salt diet-tries to limit salt intake - including recently BP Readings from Last 3 Encounters:  10/27/20 (!) 146/82  08/16/20 127/84  02/17/20 (!) 148/86  A/P: poor control at home and in office- With stroke history we opted to short-term take 1-1/2 amlodipine or 7.5 mg-once he notes pressure dropping back below 120 can go back to 1 tab or 5 mg     Recommended follow up: Return for as needed for new, worsening, persistent symptoms. Future Appointments  Date Time Provider [place]  02/17/2021  8:40 AM Marin Olp, MD [facility]    Lab/Order associations:   ICD-10-CM   1. Cough  R05.9 Novel Coronavirus, NAA ([facility])    DG Chest 2 View    CANCELED: DG Chest 2 View    2. SOB (shortness of breath)  R06.02 EKG 12-Lead    DG Chest 2 View    CANCELED: DG Chest 2 View    3. Essential hypertension   I10      I,Harris Phan,acting as a scribe for Garret Reddish, MD.,have documented all relevant documentation on the behalf of Garret Reddish, MD,as directed by  Garret Reddish, MD while in the presence of Garret Reddish, MD.   I, Garret Reddish, MD, have reviewed all documentation for this visit. The documentation on 10/27/20 for the exam, diagnosis, procedures, and orders are all accurate and complete.   Return precautions advised.  Garret Reddish, MD

## 2020-10-28 ENCOUNTER — Other Ambulatory Visit: Payer: Self-pay | Admitting: Family Medicine

## 2020-10-28 LAB — SARS-COV-2, NAA 2 DAY TAT

## 2020-10-28 LAB — NOVEL CORONAVIRUS, NAA: SARS-CoV-2, NAA: NOT DETECTED

## 2020-10-28 MED ORDER — ALBUTEROL SULFATE HFA 108 (90 BASE) MCG/ACT IN AERS: 2.0000 | INHALATION_SPRAY | Freq: Four times a day (QID) | RESPIRATORY_TRACT | 0 refills | Status: AC | PRN

## 2020-10-30 ENCOUNTER — Encounter: Payer: Self-pay | Admitting: Family Medicine

## 2020-11-06 ENCOUNTER — Encounter: Payer: Self-pay | Admitting: Family Medicine

## 2020-11-08 ENCOUNTER — Encounter: Payer: Self-pay | Admitting: Family Medicine

## 2020-11-11 DIAGNOSIS — Z23 Encounter for immunization: Secondary | ICD-10-CM | POA: Diagnosis not present

## 2020-11-18 DIAGNOSIS — C44722 Squamous cell carcinoma of skin of right lower limb, including hip: Secondary | ICD-10-CM | POA: Diagnosis not present

## 2020-11-18 DIAGNOSIS — D485 Neoplasm of uncertain behavior of skin: Secondary | ICD-10-CM | POA: Diagnosis not present

## 2020-11-18 DIAGNOSIS — I872 Venous insufficiency (chronic) (peripheral): Secondary | ICD-10-CM | POA: Diagnosis not present

## 2020-11-21 ENCOUNTER — Encounter: Payer: Self-pay | Admitting: Family Medicine

## 2020-12-01 DIAGNOSIS — D0471 Carcinoma in situ of skin of right lower limb, including hip: Secondary | ICD-10-CM | POA: Diagnosis not present

## 2020-12-08 ENCOUNTER — Other Ambulatory Visit: Payer: Self-pay | Admitting: *Deleted

## 2020-12-08 DIAGNOSIS — I6523 Occlusion and stenosis of bilateral carotid arteries: Secondary | ICD-10-CM

## 2020-12-12 ENCOUNTER — Telehealth: Payer: Self-pay

## 2020-12-12 NOTE — Telephone Encounter (Signed)
Returned pt call and left a detail message regarding the mychart message from 08/23 with the chiropractor information from [place].

## 2020-12-12 NOTE — Telephone Encounter (Signed)
Patient is calling in stating that he spoke with Dr.Hunters nurse at the last appointment about a chiropractor and said they recommended someone but he has forgotten the name of the practice. Wondering if Dr.Hunter has any recommendations.

## 2020-12-19 ENCOUNTER — Other Ambulatory Visit: Payer: Self-pay

## 2020-12-19 ENCOUNTER — Ambulatory Visit (HOSPITAL_COMMUNITY)
Admission: RE | Admit: 2020-12-19 | Discharge: 2020-12-19 | Disposition: A | Discharge status: Home / Self Care | Payer: Medicare Other | Source: Ambulatory Visit | Attending: Surgery | Admitting: Surgery

## 2020-12-19 ENCOUNTER — Ambulatory Visit (INDEPENDENT_AMBULATORY_CARE_PROVIDER_SITE_OTHER): Payer: Medicare Other | Admitting: Physician Assistant

## 2020-12-19 VITALS — BP 165/83 | HR 52 | Temp 98.0°F | Resp 14 | Ht 65.0 in | Wt 139.0 lb

## 2020-12-19 DIAGNOSIS — I6523 Occlusion and stenosis of bilateral carotid arteries: Secondary | ICD-10-CM

## 2020-12-19 DIAGNOSIS — I6521 Occlusion and stenosis of right carotid artery: Secondary | ICD-10-CM

## 2020-12-19 NOTE — Progress Notes (Signed)
Office Note     CC:  follow up Requesting Provider:  Marin Olp, MD  HPI: Ricky [name]. is a 75 y.o. (1945-07-29) male who presents for surveillance follow up of carotid stenosis. He has previously undergone right CEA 03/19/16 by Dr. Oneida Alar. This was performed due to symptomatic stenosis with preoperative stroke. The only residual symptom from his stroke has been some left upper extremity hand numbness. At time of his last visit in November of 2021 he otherwise was doing well. No new neurological symptoms. Duplex showed bilateral ICA stenosis of 1-39%.   He presents today for follow up with duplex. He  says he continues to do well. He remains active and works out most days of the week. denies amaurosis or other vision changes, slurred speech, facial drooping, unilateral upper or lower extremity weakness or numbness. He denies any claudication symptoms or rest pain. He reports that he has been working with his PCP to get is blood pressure better managed. His CCB was just increased again and he is now on Amlodipine 7.5 mg. He feels that so far it has been helping  The pt is on a statin for cholesterol management.  The pt is on a daily aspirin.   Other AC:  none The pt is on CCB for hypertension.   The pt is not diabetic.   Tobacco hx:  never  Past Medical History:  Diagnosis Date   Arthritis    Carotid artery occlusion    Complication of anesthesia    slow to wake up after shoulder surgeries   Hyperlipidemia 03/08/2016   LDL 75 pre stroke. Goal <70 so on atorvastatin 20mg  every other day   Hypertension    Peripheral vascular disease ([place])    Pneumonia    Stroke [facility])    still has decreased sensation in left hand and arm (as of 03/09/16)    Past Surgical History:  Procedure Laterality Date   CAROTID ENDARTERECTOMY     ENDARTERECTOMY Right 03/19/2016   Procedure: ENDARTERECTOMY CAROTID;  Surgeon: Elam Dutch, MD;  Location: [place];  Service: Vascular;  Laterality: Right;    PATCH ANGIOPLASTY Right 03/19/2016   Procedure: PATCH ANGIOPLASTY OF RIGHT CAROTID USING HEMASHIELD PLATINUM FINESSE;  Surgeon: Elam Dutch, MD;  Location: [place];  Service: Vascular;  Laterality: Right;   SHOULDER SURGERY Bilateral    left- injections intermittent now  Rotator cuff repair on both shoulders, 2 different surgeries   TONSILLECTOMY     TOTAL KNEE ARTHROPLASTY Left 12/14/2013   Procedure: LEFT TOTAL KNEE ARTHROPLASTY;  Surgeon: Gearlean Alf, MD;  Location: [facility];  Service: Orthopedics;  Laterality: Left;    Social History   Socioeconomic History   Marital status: Married    Spouse name: Not on file   Number of children: Not on file   Years of education: Not on file   Highest education level: Not on file  Occupational History   Occupation: Retired  Tobacco Use   Smoking status: Never   Smokeless tobacco: Never  Substance and Sexual Activity   Alcohol use: No    Comment: former drinker   Drug use: No   Sexual activity: Yes  Other Topics Concern   Not on file  Social History Narrative   Married. 1 son. 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       Hobbies: gym daily- riding exercise bike plus weights/machines, photography- used  to do a lot of black and white, gardening   Social Determinants of Health   Financial Resource Strain: Not on file  Food Insecurity: Not on file  Transportation Needs: Not on file  Physical Activity: Not on file  Stress: Not on file  Social Connections: Not on file  Intimate Partner Violence: Not on file    Family History  Problem Relation Age of Onset   Heart disease Father        in 17s- specifics unclear   Other Mother        passed in late 5s    Current Outpatient Medications  Medication Sig Dispense Refill   acetaminophen (TYLENOL) 500 MG tablet Take 500-1,000 mg by mouth every 6 (six) hours as needed (for pain.).      albuterol (VENTOLIN HFA) 108  (90 Base) MCG/ACT inhaler Inhale 2 puffs into the lungs every 6 (six) hours as needed for wheezing or shortness of breath. 1 each 0   amLODipine (NORVASC) 5 MG tablet TAKE 1 TABLET BY MOUTH EVERY DAY (Patient taking differently: 7.5 mg.) 90 tablet 3   aspirin EC 325 MG tablet Take 1 tablet (325 mg total) by mouth daily. 30 tablet 0   atorvastatin (LIPITOR) 20 MG tablet TAKE 1 TABLET BY MOUTH EVERY OTHER DAY 45 tablet 2   Multiple Vitamin (MULTIVITAMIN WITH MINERALS) TABS tablet Take 1 tablet by mouth daily.     No current facility-administered medications for this visit.    Allergies  Allergen Reactions   Amoxicillin Rash    Has patient had a PCN reaction causing immediate rash, facial/tongue/throat swelling, SOB or lightheadedness with hypotension:unsure Has patient had a PCN reaction causing severe rash involving mucus membranes or skin necrosis:unsure Has patient had a PCN reaction that required hospitalization No Has patient had a PCN reaction occurring within the last 10 years: No If all of the above answers are "NO", then may proceed with Cephalosporin use.      REVIEW OF SYSTEMS:   [X]  denotes positive finding, [ ]  denotes negative finding Cardiac  Comments:  Chest pain or chest pressure:    Shortness of breath upon exertion:    Short of breath when lying flat:    Irregular heart rhythm:        Vascular    Pain in calf, thigh, or hip brought on by ambulation:    Pain in feet at night that wakes you up from your sleep:     Blood clot in your veins:    Leg swelling:         Pulmonary    Oxygen at home:    Productive cough:     Wheezing:         Neurologic    Sudden weakness in arms or legs:     Sudden numbness in arms or legs:     Sudden onset of difficulty speaking or slurred speech:    Temporary loss of vision in one eye:     Problems with dizziness:         Gastrointestinal    Blood in stool:     Vomited blood:         Genitourinary    Burning when  urinating:     Blood in urine:        Psychiatric    Major depression:         Hematologic    Bleeding problems:    Problems with blood clotting too easily:  Skin    Rashes or ulcers:        Constitutional    Fever or chills:      PHYSICAL EXAMINATION:  Vitals:   12/19/20 0923  BP: (!) 165/83  Pulse: (!) 52  Resp: 14  Temp: 98 F (36.7 C)  TempSrc: Temporal  SpO2: 98%  Weight: 139 lb (63 kg)  Height: 5\' 5"  (1.651 m)    General:  WDWN in NAD; vital signs documented above Gait: Normal HENT: WNL, normocephalic Pulmonary: normal non-labored breathing , without wheezing Cardiac: regular HR, without  Murmurs without carotid bruit Abdomen: soft, NT, no masses Vascular Exam/Pulses:2+ radial pulses, 2+ pedal pulses bilaterally. Extremities well perfused and warm Musculoskeletal: no muscle wasting or atrophy  Neurologic: A&O X 3;  No focal weakness or paresthesias are detected Psychiatric:  The pt has Normal affect.   Non-Invasive Vascular Imaging:  12/19/20 Summary:  Right Carotid: Velocities in the right ICA are consistent with a 1-39% stenosis.   Left Carotid: Velocities in the left ICA are consistent with a 1-39% stenosis.   Vertebrals:  Bilateral vertebral arteries demonstrate antegrade flow.  Subclavians: Normal flow hemodynamics were seen in bilateral subclavian arteries.    ASSESSMENT/PLAN:: 75 y.o. male here for follow up for surveillance follow up of carotid stenosis. He has previously undergone right CEA 03/19/16 by Dr. Oneida Alar. This was performed due to symptomatic stenosis with preoperative stroke. He remains without any new neurological symptoms. - his duplex today shows stable carotid duplex unchanged from his prior. He has 1-39% bilateral ICA stenosis. Normal flow in his vertebral and subclavian arteries bilaterally - Discussed importance of continued adequate blood pressure control - Continue Aspirin and statin - reviewed signs and symptoms of TIA/  Stroke and he knows should any of these occur to seek immediate medical attention - He will return for follow up and carotid duplex in 1 year   Karoline Caldwell, PA-C [facility] [phone]  Clinic MD:   Dr. Trula Slade

## 2021-01-01 ENCOUNTER — Encounter: Payer: Self-pay | Admitting: Family Medicine

## 2021-01-02 ENCOUNTER — Telehealth: Payer: Self-pay

## 2021-01-02 NOTE — Telephone Encounter (Signed)
LAST APPOINTMENT DATE:  10/27/20  NEXT APPOINTMENT DATE: 02/17/21  MEDICATION:amLODipine (NORVASC) 5 MG tablet  PHARMACY: [id number] - [place], [place] - [address]. AT [place]

## 2021-01-03 ENCOUNTER — Encounter: Payer: Self-pay | Admitting: Family Medicine

## 2021-01-03 ENCOUNTER — Other Ambulatory Visit: Payer: Self-pay

## 2021-01-03 DIAGNOSIS — M549 Dorsalgia, unspecified: Secondary | ICD-10-CM

## 2021-01-03 MED ORDER — AMLODIPINE BESYLATE 5 MG PO TABS: 5.0000 mg | ORAL_TABLET | Freq: Every day | ORAL | 3 refills | Status: DC

## 2021-01-03 MED ORDER — AMLODIPINE BESYLATE 2.5 MG PO TABS: 2.5000 mg | ORAL_TABLET | Freq: Every day | ORAL | 1 refills | Status: DC

## 2021-01-03 NOTE — Telephone Encounter (Signed)
Medication refilled

## 2021-01-17 DIAGNOSIS — H25812 Combined forms of age-related cataract, left eye: Secondary | ICD-10-CM | POA: Diagnosis not present

## 2021-01-17 DIAGNOSIS — H2511 Age-related nuclear cataract, right eye: Secondary | ICD-10-CM | POA: Diagnosis not present

## 2021-01-17 DIAGNOSIS — H18523 Epithelial (juvenile) corneal dystrophy, bilateral: Secondary | ICD-10-CM | POA: Diagnosis not present

## 2021-02-08 NOTE — Progress Notes (Signed)
Ricky [name] [place] [address] [place] Phone: [phone] Subjective:   Ricky [name], am serving as a scribe for Dr. Hulan Saas. This visit occurred during the SARS-CoV-2 public health emergency.  Safety protocols were in place, including screening questions prior to the visit, additional usage of staff PPE, and extensive cleaning of exam room while observing appropriate contact time as indicated for disinfecting solutions.   I'm seeing this patient by the request  of:  Ricky Olp, MD  CC: Low back pain  [name]  Ricky [name]. is a 76 y.o. male coming in with complaint of back pain.  Patient's past medical history is significant for hemiparesis affecting the dominant side from a stroke.  Patient states when doing back extensions in gym is helping with pain. Standing too long or walking too much yard work that requires twisting causes the pain. The type of pain is uncomfortable and achy. Location is more towards the right SI. Left leg is longer than right.       Past Medical History:  Diagnosis Date   Arthritis    Carotid artery occlusion    Complication of anesthesia    slow to wake up after shoulder surgeries   Hyperlipidemia 03/08/2016   LDL 75 pre stroke. Goal <70 so on atorvastatin 20mg  every other day   Hypertension    Peripheral vascular disease ([place])    Pneumonia    Stroke [facility])    still has decreased sensation in left hand and arm (as of 03/09/16)   Past Surgical History:  Procedure Laterality Date   CAROTID ENDARTERECTOMY     ENDARTERECTOMY Right 03/19/2016   Procedure: ENDARTERECTOMY CAROTID;  Surgeon: Elam Dutch, MD;  Location: [place];  Service: Vascular;  Laterality: Right;   PATCH ANGIOPLASTY Right 03/19/2016   Procedure: PATCH ANGIOPLASTY OF RIGHT CAROTID USING HEMASHIELD PLATINUM FINESSE;  Surgeon: Elam Dutch, MD;  Location: [place];  Service: Vascular;  Laterality: Right;   SHOULDER SURGERY  Bilateral    left- injections intermittent now  Rotator cuff repair on both shoulders, 2 different surgeries   TONSILLECTOMY     TOTAL KNEE ARTHROPLASTY Left 12/14/2013   Procedure: LEFT TOTAL KNEE ARTHROPLASTY;  Surgeon: Gearlean Alf, MD;  [facility];  Service: Orthopedics;  Laterality: Left;   Social History   Socioeconomic History   Marital status: Married    Spouse name: Not on file   Number of children: Not on file   Years of education: Not on file   Highest education level: Not on file  Occupational History   Occupation: Retired  Tobacco Use   Smoking status: Never   Smokeless tobacco: Never  Substance and Sexual Activity   Alcohol use: No    Comment: former drinker   Drug use: No   Sexual activity: Yes  Other Topics Concern   Not on file  Social History Narrative   Married. 1 son. 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       Hobbies: gym daily- riding exercise bike plus weights/machines, photography- used to do a lot of black and white, gardening   Social Determinants of Health   Financial Resource Strain: Not on file  Food Insecurity: Not on file  Transportation Needs: Not on file  Physical Activity: Not on file  Stress: Not on file  Social Connections: Not on file   Allergies  Allergen  Reactions   Amoxicillin Rash    Has patient had a PCN reaction causing immediate rash, facial/tongue/throat swelling, SOB or lightheadedness with hypotension:unsure Has patient had a PCN reaction causing severe rash involving mucus membranes or skin necrosis:unsure Has patient had a PCN reaction that required hospitalization No Has patient had a PCN reaction occurring within the last 10 years: No If all of the above answers are "NO", then may proceed with Cephalosporin use.    Family History  Problem Relation Age of Onset   Heart disease Father        in 11s- specifics unclear   Other Mother         passed in late 7s     Current Outpatient Medications (Cardiovascular):    amLODipine (NORVASC) 2.5 MG tablet, Take 1 tablet (2.5 mg total) by mouth daily.   amLODipine (NORVASC) 5 MG tablet, Take 1 tablet (5 mg total) by mouth daily.   atorvastatin (LIPITOR) 20 MG tablet, TAKE 1 TABLET BY MOUTH EVERY OTHER DAY  Current Outpatient Medications (Respiratory):    albuterol (VENTOLIN HFA) 108 (90 Base) MCG/ACT inhaler, Inhale 2 puffs into the lungs every 6 (six) hours as needed for wheezing or shortness of breath.  Current Outpatient Medications (Analgesics):    acetaminophen (TYLENOL) 500 MG tablet, Take 500-1,000 mg by mouth every 6 (six) hours as needed (for pain.).    aspirin EC 325 MG tablet, Take 1 tablet (325 mg total) by mouth daily.   Current Outpatient Medications (Other):    Multiple Vitamin (MULTIVITAMIN WITH MINERALS) TABS tablet, Take 1 tablet by mouth daily.   Reviewed prior external information including notes and imaging from  primary care provider As well as notes that were available from care everywhere and other healthcare systems.  Past medical history, social, surgical and family history all reviewed in electronic medical record.  No pertanent information unless stated regarding to the chief complaint.   Review of Systems:  No headache, visual changes, nausea, vomiting, diarrhea, constipation, dizziness, abdominal pain, skin rash, fevers, chills, night sweats, weight loss, swollen lymph nodes, body aches, joint swelling, chest pain, shortness of breath, mood changes. POSITIVE muscle aches  Objective  Blood pressure (!) 150/98, pulse 60, height 5\' 5"  (1.651 m), weight 140 lb (63.5 kg), SpO2 96 %.   General: No apparent distress alert and oriented x3 mood and affect normal, dressed appropriately.  HEENT: Pupils equal, extraocular movements intact  Respiratory: Patient's speak in full sentences and does not appear short of breath  Cardiovascular: No lower extremity  edema, non tender, no erythema  Gait antalgic MSK: Patient does have a leg length discrepancy with the left leg being longer.  Patient does have some mild angulation of the knee replacement on the left side.  Low back exam does show the patient does have degenerative scoliosis noted of the lumbar spine.  Limited range of motion in all planes.  Patient does have a negative Corky Sox.  Negative straight leg test.   [id number]; 15 additional minutes spent for Therapeutic exercises as stated in above notes.  This included exercises focusing on stretching, strengthening, with significant focus on eccentric aspects.   Long term goals include an improvement in range of motion, strength, endurance as well as avoiding reinjury. Patient's frequency would include in 1-2 times a day, 3-5 times a week for a duration of 6-12 weeks. Low back exercises that included:  Pelvic tilt/bracing instruction to focus on control of the pelvic girdle and lower abdominal muscles  Glute  strengthening exercises, focusing on proper firing of the glutes without engaging the low back muscles Proper stretching techniques for maximum relief for the hamstrings, hip flexors, low back and some rotation where tolerated  Proper technique shown and discussed handout in great detail with ATC.  All questions were discussed and answered.     Impression and Recommendations:    The above documentation has been reviewed and is accurate and complete Lyndal Pulley, DO

## 2021-02-09 ENCOUNTER — Ambulatory Visit (INDEPENDENT_AMBULATORY_CARE_PROVIDER_SITE_OTHER): Payer: Medicare Other

## 2021-02-09 ENCOUNTER — Other Ambulatory Visit: Payer: Self-pay

## 2021-02-09 ENCOUNTER — Ambulatory Visit (INDEPENDENT_AMBULATORY_CARE_PROVIDER_SITE_OTHER): Payer: Medicare Other | Admitting: Family Medicine

## 2021-02-09 ENCOUNTER — Encounter: Payer: Self-pay | Admitting: Family Medicine

## 2021-02-09 VITALS — BP 150/98 | HR 60 | Ht 65.0 in | Wt 140.0 lb

## 2021-02-09 DIAGNOSIS — M2170 Unequal limb length (acquired), unspecified site: Secondary | ICD-10-CM

## 2021-02-09 DIAGNOSIS — M2578 Osteophyte, vertebrae: Secondary | ICD-10-CM | POA: Diagnosis not present

## 2021-02-09 DIAGNOSIS — M549 Dorsalgia, unspecified: Secondary | ICD-10-CM | POA: Diagnosis not present

## 2021-02-09 DIAGNOSIS — M5136 Other intervertebral disc degeneration, lumbar region: Secondary | ICD-10-CM | POA: Diagnosis not present

## 2021-02-09 DIAGNOSIS — M545 Low back pain, unspecified: Secondary | ICD-10-CM | POA: Diagnosis not present

## 2021-02-09 DIAGNOSIS — M4316 Spondylolisthesis, lumbar region: Secondary | ICD-10-CM | POA: Diagnosis not present

## 2021-02-09 DIAGNOSIS — R102 Pelvic and perineal pain: Secondary | ICD-10-CM | POA: Diagnosis not present

## 2021-02-09 NOTE — Assessment & Plan Note (Signed)
Encouraged heel lift on the right side

## 2021-02-09 NOTE — Patient Instructions (Addendum)
Xray Today Heel lift right side Vit D 2000iu and tart cherry 1200mg   See you again in 5-6 weeks

## 2021-02-09 NOTE — Assessment & Plan Note (Signed)
We do believe the patient does have significant degenerative disc disease of the lumbar spine.  Discussed icing regimen and home exercise, discussed which activities to do which wants to avoid.  Increase activity slowly.  Discussed with patient I do think formal physical therapy would be beneficial for him.  Patient will be referred today as well.  Hold on any type of medications at the moment but may need to consider the possibility of gabapentin.  Also for any worsening symptoms or weakness advanced imaging would be warranted.  Patient will follow up with me again in 4 to 6 weeks.

## 2021-02-13 DIAGNOSIS — I872 Venous insufficiency (chronic) (peripheral): Secondary | ICD-10-CM | POA: Diagnosis not present

## 2021-02-13 DIAGNOSIS — D0471 Carcinoma in situ of skin of right lower limb, including hip: Secondary | ICD-10-CM | POA: Diagnosis not present

## 2021-02-14 ENCOUNTER — Encounter: Payer: Self-pay | Admitting: Family Medicine

## 2021-02-15 NOTE — Progress Notes (Signed)
Phone [phone] In person visit   Subjective:   Ricky [name]. is a 76 y.o. year old very pleasant male patient who presents for/with See problem oriented charting Chief Complaint  Patient presents with   Follow-up   Hyperlipidemia   Hypertension    This visit occurred during the SARS-CoV-2 public health emergency.  Safety protocols were in place, including screening questions prior to the visit, additional usage of staff PPE, and extensive cleaning of exam room while observing appropriate contact time as indicated for disinfecting solutions.   Past Medical History-  Patient Active Problem List   Diagnosis Date Noted   Hemiparesis affecting dominant side as late effect of stroke ([place]) 03/08/2016    Priority: High   History of CVA (cerebrovascular accident) 02/28/2016    Priority: High   Carotid artery stenosis, symptomatic, right 03/19/2016    Priority: Medium    Hyperlipidemia 03/08/2016    Priority: Medium    Essential hypertension 12/20/2014    Priority: Medium    Leukopenia 01/08/2017    Priority: Low   Trigger middle finger of left hand 11/12/2016    Priority: Low   OA (osteoarthritis) of knee 12/14/2013    Priority: Low   Degenerative disc disease, lumbar 02/09/2021   Acquired leg length discrepancy 02/09/2021    Medications- reviewed and updated Current Outpatient Medications  Medication Sig Dispense Refill   acetaminophen (TYLENOL) 500 MG tablet Take 500-1,000 mg by mouth every 6 (six) hours as needed (for pain.).      albuterol (VENTOLIN HFA) 108 (90 Base) MCG/ACT inhaler Inhale 2 puffs into the lungs every 6 (six) hours as needed for wheezing or shortness of breath. 1 each 0   amLODipine (NORVASC) 2.5 MG tablet Take 1 tablet (2.5 mg total) by mouth daily. 90 tablet 1   amLODipine (NORVASC) 5 MG tablet Take 1 tablet (5 mg total) by mouth daily. 90 tablet 3   aspirin EC 325 MG tablet Take 1 tablet (325 mg total) by mouth daily. 30 tablet 0    atorvastatin (LIPITOR) 20 MG tablet TAKE 1 TABLET BY MOUTH EVERY OTHER DAY 45 tablet 2   Multiple Vitamin (MULTIVITAMIN WITH MINERALS) TABS tablet Take 1 tablet by mouth daily.     No current facility-administered medications for this visit.     Objective:  BP 128/80 Comment: 128/80 average over last 15 readings at home.   Pulse 72    Temp (!) 97.5 F (36.4 C)    Ht 5\' 5"  (1.651 m)    Wt 138 lb 3.2 oz (62.7 kg)    SpO2 98%    BMI 23.00 kg/m  Gen: NAD, resting comfortably CV: RRR no murmurs rubs or gallops Lungs: CTAB no crackles, wheeze, rhonchi Ext: no edema Skin: warm, dry    Assessment and Plan   #hypertension S: medication: Amlodipine 7.5 mg daily Home readings #s:  Avg over last 15 home readings 128.2 [phone]  Initial 140/82 in office BP Readings from Last 3 Encounters:  02/17/21 128/80  02/09/21 (!) 150/98  12/19/20 (!) 165/83  A/P: Controlled. Continue current medications.   # history of CVA with hemiparesis affecting dominant side (left) as a result  # carotid stenosis- carotid stenosis of right side status post endarterectomy on the right with 1-39% on the left followed by Dr. Oneida Alar #hyperlipidemia - LDL goal below 70 S: Medication:Atorvastatin 20 mg every other day, aspirin 325 mg daily  - occasionally dropped objects with left hand (left handed)-denies recent worsening but  overall stable . Trouble with buttons -Most recently he saw vascular on 12/19/2020-duplex was stable with 1 to 39% stenosis bilaterally with plan for 1 year follow-up Lab Results  Component Value Date   CHOL 112 02/17/2020   HDL 64.40 02/17/2020   LDLCALC 40 02/17/2020   LDLDIRECT 44.0 08/03/2019   TRIG 40.0 02/17/2020   CHOLHDL 2 02/17/2020   A/P: hemiparesis stable. For lipids at goal on last check- update now and continue current meds including aspirin and atorvastatin  # Hyperglycemia/insulin resistance/prediabetes- peak a1c 103 08/03/19. Peak A1c 5.9 February 17, 2020 S: Medication:  None Lab Results  Component Value Date   HGBA1C 5.7 08/16/2020   HGBA1C 5.9 02/17/2020   HGBA1C 5.7 08/03/2019   A/P: Suspect will be stable or at least below 6-update A1c with labs today  #Mild leukopenia-other cell lines were normal until last visit when he had slight anemia as well as slightly low platelets.Plan at last visit was CBC with differential as well as pathology smear review - largely reasssuring pathology smear review then reverted to primarily leukopenia-check CBC alone  # Left knee pain- Follows with Dr. Elmyra Ricks. intermittent issues- overall better lately  #Low back pain-working with Dr. Tamala Julian and has follow-up visit in February . Doing back exercises, vit D, tart cherry and will see Dr. Belia Heman rehab  #trigger finger doing better- not recently needing splint  # nocturia/decreased flow/urinary frequency S:last year reported more nocturia- urine culture did show UTI and we treated with keflex for pan sensitive E. Coli.   He has noted over last 6 months decreased flow and increased frequency intermittently Lab Results  Component Value Date   PSA 0.87 08/03/2019   PSA 1.22 11/30/2013  A/P: with worsening symptoms update PSA. With prior UTI check culture. Discussed flomax - wants to think it over  #prior breathing issues largely resolved- has not needed albuterol  #skin cancer- mohs surgery 11/18/20 and 02/13/21 with sutures coming out on 1/23 and next exam 2/22 with Dr. Allyson Sabal  #cataract eval 1/30 with Dr. Alanda Slim   Recommended follow up: Return in about 6 months (around 08/17/2021) for follow-up or sooner if needed. Future Appointments  Date Time Provider Ashley  03/28/2021  8:30 AM Lyndal Pulley, DO [facility] None    Lab/Order associations:   ICD-10-CM   1. Essential hypertension  I10 CBC with Differential/Platelet    Comprehensive metabolic panel    Lipid panel    2. Hyperlipidemia, unspecified hyperlipidemia type  E78.5 CBC with  Differential/Platelet    Comprehensive metabolic panel    Lipid panel    3. Hyperglycemia  R73.9 Hemoglobin A1c    4. Carotid artery stenosis, symptomatic, right  I65.21     5. Hemiparesis affecting dominant side as late effect of stroke (HCC) Chronic I69.359     6. Urinary frequency  R35.0 PSA    Urine Culture    7. Nocturia  R35.1 PSA    Urine Culture      No orders of the defined types were placed in this encounter.  I,Jada Bradford,acting as a scribe for Garret Reddish, MD.,have documented all relevant documentation on the behalf of Garret Reddish, MD,as directed by  Garret Reddish, MD while in the presence of Garret Reddish, MD.   I, Garret Reddish, MD, have reviewed all documentation for this visit. The documentation on 02/17/21 for the exam, diagnosis, procedures, and orders are all accurate and complete.   Return precautions advised.  Garret Reddish, MD

## 2021-02-17 ENCOUNTER — Encounter: Payer: Self-pay | Admitting: Family Medicine

## 2021-02-17 ENCOUNTER — Ambulatory Visit (INDEPENDENT_AMBULATORY_CARE_PROVIDER_SITE_OTHER): Payer: Medicare Other | Admitting: Family Medicine

## 2021-02-17 ENCOUNTER — Other Ambulatory Visit: Payer: Self-pay

## 2021-02-17 VITALS — BP 128/80 | HR 72 | Temp 97.5°F | Ht 65.0 in | Wt 138.2 lb

## 2021-02-17 DIAGNOSIS — I10 Essential (primary) hypertension: Secondary | ICD-10-CM | POA: Diagnosis not present

## 2021-02-17 DIAGNOSIS — E785 Hyperlipidemia, unspecified: Secondary | ICD-10-CM | POA: Diagnosis not present

## 2021-02-17 DIAGNOSIS — R739 Hyperglycemia, unspecified: Secondary | ICD-10-CM

## 2021-02-17 DIAGNOSIS — I69359 Hemiplegia and hemiparesis following cerebral infarction affecting unspecified side: Secondary | ICD-10-CM | POA: Diagnosis not present

## 2021-02-17 DIAGNOSIS — R351 Nocturia: Secondary | ICD-10-CM | POA: Diagnosis not present

## 2021-02-17 DIAGNOSIS — R350 Frequency of micturition: Secondary | ICD-10-CM | POA: Diagnosis not present

## 2021-02-17 DIAGNOSIS — I6521 Occlusion and stenosis of right carotid artery: Secondary | ICD-10-CM | POA: Diagnosis not present

## 2021-02-17 LAB — CBC WITH DIFFERENTIAL/PLATELET
Basophils Absolute: 0 10*3/uL (ref 0.0–0.1)
Basophils Relative: 0.7 % (ref 0.0–3.0)
Eosinophils Absolute: 0.2 10*3/uL (ref 0.0–0.7)
Eosinophils Relative: 5 % (ref 0.0–5.0)
HCT: 41 % (ref 39.0–52.0)
Hemoglobin: 13.6 g/dL (ref 13.0–17.0)
Lymphocytes Relative: 22.5 % (ref 12.0–46.0)
Lymphs Abs: 0.8 10*3/uL (ref 0.7–4.0)
MCHC: 33.2 g/dL (ref 30.0–36.0)
MCV: 96.4 fl (ref 78.0–100.0)
Monocytes Absolute: 0.5 10*3/uL (ref 0.1–1.0)
Monocytes Relative: 12.5 % — ABNORMAL HIGH (ref 3.0–12.0)
Neutro Abs: 2.1 10*3/uL (ref 1.4–7.7)
Neutrophils Relative %: 59.3 % (ref 43.0–77.0)
Platelets: 231 10*3/uL (ref 150.0–400.0)
RBC: 4.26 Mil/uL (ref 4.22–5.81)
RDW: 13.5 % (ref 11.5–15.5)
WBC: 3.6 10*3/uL — ABNORMAL LOW (ref 4.0–10.5)

## 2021-02-17 LAB — COMPREHENSIVE METABOLIC PANEL
ALT: 20 U/L (ref 0–53)
AST: 25 U/L (ref 0–37)
Albumin: 4 g/dL (ref 3.5–5.2)
Alkaline Phosphatase: 60 U/L (ref 39–117)
BUN: 21 mg/dL (ref 6–23)
CO2: 30 mEq/L (ref 19–32)
Calcium: 9.5 mg/dL (ref 8.4–10.5)
Chloride: 105 mEq/L (ref 96–112)
Creatinine, Ser: 0.86 mg/dL (ref 0.40–1.50)
GFR: 83.69 mL/min (ref >60.00)
Glucose, Bld: 100 mg/dL — ABNORMAL HIGH (ref 70–99)
Potassium: 5.1 mEq/L (ref 3.5–5.1)
Sodium: 141 mEq/L (ref 135–145)
Total Bilirubin: 0.7 mg/dL (ref 0.2–1.2)
Total Protein: 6.3 g/dL (ref 6.0–8.3)

## 2021-02-17 LAB — LIPID PANEL
Cholesterol: 112 mg/dL (ref 0–200)
HDL: 63.4 mg/dL (ref >39.00)
LDL Cholesterol: 40 mg/dL (ref 0–99)
NonHDL: 48.65
Total CHOL/HDL Ratio: 2
Triglycerides: 41 mg/dL (ref 0.0–149.0)
VLDL: 8.2 mg/dL (ref 0.0–40.0)

## 2021-02-17 LAB — HEMOGLOBIN A1C: Hgb A1c MFr Bld: 5.9 % (ref 4.6–6.5)

## 2021-02-17 LAB — PSA: PSA: 0.46 ng/mL (ref 0.10–4.00)

## 2021-02-17 NOTE — Patient Instructions (Addendum)
Please stop by lab before you go If you have mychart- we will send your results within 3 business days of [place] receiving them.  If you do not have mychart- we will call you about results within 5 business days of [place] receiving them.  *please also note that you will see labs on mychart as soon as they post. I will later go in and write notes on them- will say "notes from Dr. Yong Channel"  Recommended follow up: Return in about 6 months (around 08/17/2021) for follow-up or sooner if needed.

## 2021-02-20 ENCOUNTER — Encounter: Payer: Self-pay | Admitting: Family Medicine

## 2021-02-21 ENCOUNTER — Other Ambulatory Visit: Payer: Medicare Other

## 2021-02-21 ENCOUNTER — Other Ambulatory Visit: Payer: Self-pay

## 2021-02-21 DIAGNOSIS — R351 Nocturia: Secondary | ICD-10-CM | POA: Diagnosis not present

## 2021-02-21 NOTE — Addendum Note (Signed)
Addended byHildred Alamin on: 02/21/2021 08:17 AM   Modules accepted: Orders

## 2021-02-22 LAB — URINE CULTURE
MICRO NUMBER:: 12880099
Result:: NO GROWTH
SPECIMEN QUALITY:: ADEQUATE

## 2021-03-02 DIAGNOSIS — M545 Low back pain, unspecified: Secondary | ICD-10-CM | POA: Diagnosis not present

## 2021-03-09 DIAGNOSIS — M545 Low back pain, unspecified: Secondary | ICD-10-CM | POA: Diagnosis not present

## 2021-03-14 DIAGNOSIS — M545 Low back pain, unspecified: Secondary | ICD-10-CM | POA: Diagnosis not present

## 2021-03-24 ENCOUNTER — Telehealth: Payer: Self-pay | Admitting: Family Medicine

## 2021-03-24 NOTE — Telephone Encounter (Signed)
Copied from [place] [id number]. Topic: Medicare AWV >> Mar 24, 2021 10:33 AM Harris-Coley, Hannah Beat wrote: Reason for CRM: Left message for patient to schedule Annual Wellness Visit.  Please schedule with Nurse Health Advisor Charlott Rakes, RN at [facility].  Please call [phone] ask for [facility]

## 2021-03-24 NOTE — Telephone Encounter (Signed)
Copied from [place] [id number]. Topic: Medicare AWV >> Mar 24, 2021 10:33 AM Harris-Coley, Hannah Beat wrote: Reason for CRM: Left message for patient to schedule Annual Wellness Visit.  Please schedule with Nurse Health Advisor Charlott Rakes, RN at [facility].  Please call [phone] ask for [facility]

## 2021-03-28 ENCOUNTER — Ambulatory Visit: Payer: Medicare Other | Admitting: Family Medicine

## 2021-03-29 DIAGNOSIS — Z85828 Personal history of other malignant neoplasm of skin: Secondary | ICD-10-CM | POA: Diagnosis not present

## 2021-03-29 DIAGNOSIS — L7211 Pilar cyst: Secondary | ICD-10-CM | POA: Diagnosis not present

## 2021-03-29 DIAGNOSIS — L821 Other seborrheic keratosis: Secondary | ICD-10-CM | POA: Diagnosis not present

## 2021-03-29 DIAGNOSIS — Z08 Encounter for follow-up examination after completed treatment for malignant neoplasm: Secondary | ICD-10-CM | POA: Diagnosis not present

## 2021-03-29 DIAGNOSIS — L814 Other melanin hyperpigmentation: Secondary | ICD-10-CM | POA: Diagnosis not present

## 2021-03-29 DIAGNOSIS — L570 Actinic keratosis: Secondary | ICD-10-CM | POA: Diagnosis not present

## 2021-03-29 DIAGNOSIS — D225 Melanocytic nevi of trunk: Secondary | ICD-10-CM | POA: Diagnosis not present

## 2021-03-30 DIAGNOSIS — M545 Low back pain, unspecified: Secondary | ICD-10-CM | POA: Diagnosis not present

## 2021-04-06 DIAGNOSIS — M545 Low back pain, unspecified: Secondary | ICD-10-CM | POA: Diagnosis not present

## 2021-04-11 DIAGNOSIS — M545 Low back pain, unspecified: Secondary | ICD-10-CM | POA: Diagnosis not present

## 2021-04-18 ENCOUNTER — Other Ambulatory Visit: Payer: Self-pay | Admitting: Family Medicine

## 2021-04-18 DIAGNOSIS — M545 Low back pain, unspecified: Secondary | ICD-10-CM | POA: Diagnosis not present

## 2021-04-21 ENCOUNTER — Ambulatory Visit (INDEPENDENT_AMBULATORY_CARE_PROVIDER_SITE_OTHER): Payer: Medicare Other

## 2021-04-21 ENCOUNTER — Other Ambulatory Visit: Payer: Self-pay

## 2021-04-21 VITALS — BP 140/78 | HR 56 | Temp 98.0°F | Wt 138.8 lb

## 2021-04-21 DIAGNOSIS — Z0000 Encounter for general adult medical examination without abnormal findings: Secondary | ICD-10-CM

## 2021-04-21 NOTE — Progress Notes (Signed)
? ?Subjective:  ? Ricky [name]. is a 76 y.o. male who presents for Medicare Annual/Subsequent preventive examination. ? ?Review of Systems    ? ?Cardiac Risk Factors include: advanced age (>26mn, >>54women);hypertension;dyslipidemia;male gender ? ?   ?Objective:  ?  ?Today's Vitals  ? 04/21/21 1015  ?BP: 140/78  ?Pulse: (!) 56  ?Temp: 98 ?F (36.7 ?C)  ?SpO2: 98%  ?Weight: 138 lb 12.8 oz (63 kg)  ? ?Body mass index is 23.1 kg/m?. ? ?Advanced Directives 04/21/2021 10/01/2019 09/22/2018 04/25/2017 01/10/2017 10/18/2016 03/19/2016  ?Does Patient Have a Medical Advance Directive? Yes No Yes Yes Yes Yes Yes  ?Type of AAcademic librarian- Healthcare Power of [place]ilot Kn[place]Living will [place]yt[place] will - [place] will Living will  ?Does patient want to make changes to medical advance directive? - - No - Patient declined - - - No - Patient declined  ?Copy of [place]? Yes - validated most recent copy scanned in chart (See row information) - No - copy requested No - copy requested - No - copy requested -  ?Would patient like information on creating a medical advance directive? - - Yes (MAU/Ambulatory/Procedural Areas - Information given) - - - No - Patient declined  ? ? ?Current Medications (verified) ?Outpatient Encounter Medications as of 04/21/2021  ?Medication Sig  ? acetaminophen (TYLENOL) 500 MG tablet Take 500-1,000 mg by mouth every 6 (six) hours as needed (for pain.).   ? albuterol (VENTOLIN HFA) 108 (90 Base) MCG/ACT inhaler Inhale 2 puffs into the lungs every 6 (six) hours as needed for wheezing or shortness of breath.  ? amLODipine (NORVASC) 2.5 MG tablet Take 1 tablet (2.5 mg total) by mouth daily.  ? amLODipine (NORVASC) 5 MG tablet Take 1 tablet (5 mg total) by mouth daily.  ? aspirin EC 325 MG tablet Take 1 tablet (325 mg total) by mouth daily.  ? atorvastatin (LIPITOR) 20 MG tablet TAKE 1 TABLET BY MOUTH EVERY  OTHER DAY  ? Multiple Vitamin (MULTIVITAMIN WITH MINERALS) TABS tablet Take 1 tablet by mouth daily.  ? ?No facility-administered encounter medications on file as of 04/21/2021.  ? ? ?Allergies (verified) ?Amoxicillin  ? ?History: ?Past Medical History:  ?Diagnosis Date  ? Arthritis   ? Carotid artery occlusion   ? Complication of anesthesia   ? slow to wake up after shoulder surgeries  ? Hyperlipidemia 03/08/2016  ? LDL 75 pre stroke. Goal <70 so on atorvastatin '20mg'$  every other day  ? Hypertension   ? Peripheral vascular disease ([place]   ? Pneumonia   ? Stroke ([facility]   ? still has decreased sensation in left hand and arm (as of 03/09/16)  ? ?Past Surgical History:  ?Procedure Laterality Date  ? CAROTID ENDARTERECTOMY    ? ENDARTERECTOMY Right 03/19/2016  ? Procedure: ENDARTERECTOMY CAROTID;  Surgeon: CElam Dutch MD;  [place]  Service: Vascular;  Laterality: Right;  ? PATCH ANGIOPLASTY Right 03/19/2016  ? Procedure: PATCH ANGIOPLASTY OF RIGHT CAROTID USING HEMASHIELD PLATINUM FINESSE;  Surgeon: CElam Dutch MD;  [place]  Service: Vascular;  Laterality: Right;  ? SHOULDER SURGERY Bilateral   ? left- injections intermittent now  Rotator cuff repair on both shoulders, 2 different surgeries  ? TONSILLECTOMY    ? TOTAL KNEE ARTHROPLASTY Left 12/14/2013  ? Procedure: LEFT TOTAL KNEE ARTHROPLASTY;  Surgeon: FGearlean Alf MD;  Location: [facility];  Service: Orthopedics;  Laterality:  Left;  ? ?Family History  ?Problem Relation Age of Onset  ? Heart disease Father   ?     in 50s- specifics unclear  ? Other Mother   ?     passed in late 73s  ? ?Social History  ? ?Socioeconomic History  ? Marital status: Married  ?  Spouse name: Not on file  ? Number of children: Not on file  ? Years of education: Not on file  ? Highest education level: Not on file  ?Occupational History  ? Occupation: Retired  ?Tobacco Use  ? Smoking status: Never  ? Smokeless tobacco: Never  ?Substance and Sexual Activity  ? Alcohol use:  No  ?  Comment: former drinker  ? Drug use: No  ? Sexual activity: Yes  ?Other Topics Concern  ? Not on file  ?Social History Narrative  ? Married. 1 son. 3 grandkids- oldest just started college  ?   ? 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   ?   ? Hobbies: gym daily- riding exercise bike plus weights/machines, photography- used to do a lot of black and white, gardening  ? ?Social Determinants of Health  ? ?Financial Resource Strain: Low Risk   ? Difficulty of Paying Living Expenses: Not hard at all  ?Food Insecurity: No Food Insecurity  ? Worried About Charity fundraiser in the Last Year: Never true  ? Ran Out of Food in the Last Year: Never true  ?Transportation Needs: No Transportation Needs  ? Lack of Transportation (Medical): No  ? Lack of Transportation (Non-Medical): No  ?Physical Activity: Sufficiently Active  ? Days of Exercise per Week: 3 days  ? Minutes of Exercise per Session: 90 min  ?Stress: No Stress Concern Present  ? Feeling of Stress : Not at all  ?Social Connections: Moderately Isolated  ? Frequency of Communication with Friends and Family: Twice a week  ? Frequency of Social Gatherings with Friends and Family: More than three times a week  ? Attends Religious Services: Never  ? Active Member of Clubs or Organizations: No  ? Attends Archivist Meetings: Never  ? Marital Status: Married  ? ? ?Tobacco Counseling ?Counseling given: Not Answered ? ? ?Clinical Intake: ? ?Pre-visit preparation completed: Yes ? ?Pain : No/denies pain ? ?  ? ?BMI - recorded: 23.1 ?Nutritional Status: BMI of 19-24  Normal ?Nutritional Risks: None ?Diabetes: No ? ?How often do you need to have someone help you when you read instructions, pamphlets, or other written materials from your doctor or pharmacy?: 1 - Never ? ?Diabetic?no ? ?Interpreter Needed?: No ? ?Information entered by :: Charlott Rakes, LPN ? ? ?Activities of Daily Living ?In your present state of health, do you have any difficulty  performing the following activities: 04/21/2021 10/27/2020  ?Hearing? N N  ?Vision? N N  ?Difficulty concentrating or making decisions? N N  ?Walking or climbing stairs? N N  ?Dressing or bathing? N N  ?Doing errands, shopping? N N  ?Preparing Food and eating ? N -  ?Using the Toilet? N -  ?In the past six months, have you accidently leaked urine? N -  ?Do you have problems with loss of bowel control? N -  ?Managing your Medications? N -  ?Managing your Finances? N -  ?Housekeeping or managing your Housekeeping? N -  ?Some recent data might be hidden  ? ? ?Patient Care Team: ?Marin Olp, MD as PCP - General (Family Medicine) ? ?Indicate any  recent Medical Services you may have received from other than [facility] providers in the past year (date may be approximate). ? ?   ?Assessment:  ? This is a routine wellness examination for [facility]. ? ?Hearing/Vision screen ?Hearing Screening - Comments:: Pt denies any hearing issues  ?Vision Screening - Comments:: Pt follows up with Dr Alanda Slim for annual eye exams  ? ?Dietary issues and exercise activities discussed: ?Current Exercise Habits: Home exercise routine, Type of exercise: Other - see comments, Time (Minutes): > 60, Frequency (Times/Week): 3, Weekly Exercise (Minutes/Week): 0 ? ? Goals Addressed   ? ?  ?  ?  ?  ? This Visit's Progress  ?  Patient Stated     ?  None at this time  ?  ? ?  ? ?Depression Screen ?PHQ 2/9 Scores 04/21/2021 10/27/2020 08/16/2020 10/01/2019 08/03/2019 01/26/2019 09/22/2018  ?PHQ - 2 Score 0 0 0 0 0 0 0  ?  ?Fall Risk ?Fall Risk  04/21/2021 10/27/2020 08/16/2020 02/17/2020 10/01/2019  ?Falls in the past year? 1 0 0 0 1  ?Comment - - - - -  ?Number falls in past yr: 1 0 0 0 1  ?Injury with Fall? 1 0 0 0 1  ?Comment cut lip and broke glasses - - - -  ?Risk for fall due to : Impaired vision No Fall Risks No Fall Risks - Impaired balance/gait;Impaired vision;History of fall(s)  ?Follow up Falls prevention discussed Falls evaluation completed Falls evaluation  completed - Falls prevention discussed  ? ? ?FALL RISK PREVENTION PERTAINING TO THE HOME: ? ?Any stairs in or around the home? Yes  ?If so, are there any without handrails? No  ?Home free of loose

## 2021-04-21 NOTE — Patient Instructions (Signed)
Mr. Orth , ?Thank you for taking time to come for your Medicare Wellness Visit. I appreciate your ongoing commitment to your health goals. Please review the following plan we discussed and let me know if I can assist you in the future.  ? ?Screening recommendations/referrals: ?Colonoscopy: No longer required  ?Recommended yearly ophthalmology/optometry visit for glaucoma screening and checkup ?Recommended yearly dental visit for hygiene and checkup ? ?Vaccinations: ?Influenza vaccine: Done 11/11/20 repeat every year  ?Pneumococcal vaccine: Up to date ?Tdap vaccine: Done 04/20/14 repeat every 10 years  ?Shingles vaccine: Completed 8/22, 12/11/18   ?Covid-19: Completed 1/11, 1/30, 11/11/19 & 4/3, 11/11/20 ? ?Advanced directives: Copies in chart  ? ?Conditions/risks identified: none at this time  ? ?Next appointment: Follow up in one year for your annual wellness visit.  ? ?Preventive Care 24 Years and Older, Male ?Preventive care refers to lifestyle choices and visits with your health care provider that can promote health and wellness. ?What does preventive care include? ?A yearly physical exam. This is also called an annual well check. ?Dental exams once or twice a year. ?Routine eye exams. Ask your health care provider how often you should have your eyes checked. ?Personal lifestyle choices, including: ?Daily care of your teeth and gums. ?Regular physical activity. ?Eating a healthy diet. ?Avoiding tobacco and drug use. ?Limiting alcohol use. ?Practicing safe sex. ?Taking low doses of aspirin every day. ?Taking vitamin and mineral supplements as recommended by your health care provider. ?What happens during an annual well check? ?The services and screenings done by your health care provider during your annual well check will depend on your age, overall health, lifestyle risk factors, and family history of disease. ?Counseling  ?Your health care provider may ask you questions about your: ?Alcohol use. ?Tobacco  use. ?Drug use. ?Emotional well-being. ?Home and relationship well-being. ?Sexual activity. ?Eating habits. ?History of falls. ?Memory and ability to understand (cognition). ?Work and work Statistician. ?Screening  ?You may have the following tests or measurements: ?Height, weight, and BMI. ?Blood pressure. ?Lipid and cholesterol levels. These may be checked every 5 years, or more frequently if you are over 20 years old. ?Skin check. ?Lung cancer screening. You may have this screening every year starting at age 89 if you have a 30-pack-year history of smoking and currently smoke or have quit within the past 15 years. ?Fecal occult blood test (FOBT) of the stool. You may have this test every year starting at age 87. ?Flexible sigmoidoscopy or colonoscopy. You may have a sigmoidoscopy every 5 years or a colonoscopy every 10 years starting at age 24. ?Prostate cancer screening. Recommendations will vary depending on your family history and other risks. ?Hepatitis C blood test. ?Hepatitis B blood test. ?Sexually transmitted disease (STD) testing. ?Diabetes screening. This is done by checking your blood sugar (glucose) after you have not eaten for a while (fasting). You may have this done every 1-3 years. ?Abdominal aortic aneurysm (AAA) screening. You may need this if you are a current or former smoker. ?Osteoporosis. You may be screened starting at age 75 if you are at high risk. ?Talk with your health care provider about your test results, treatment options, and if necessary, the need for more tests. ?Vaccines  ?Your health care provider may recommend certain vaccines, such as: ?Influenza vaccine. This is recommended every year. ?Tetanus, diphtheria, and acellular pertussis (Tdap, Td) vaccine. You may need a Td booster every 10 years. ?Zoster vaccine. You may need this after age 38. ?Pneumococcal 13-valent conjugate (PCV13)  vaccine. One dose is recommended after age 58. ?Pneumococcal polysaccharide (PPSV23) vaccine.  One dose is recommended after age 14. ?Talk to your health care provider about which screenings and vaccines you need and how often you need them. ?This information is not intended to replace advice given to you by your health care provider. Make sure you discuss any questions you have with your health care provider. ?Document Released: 02/18/2015 Document Revised: 10/12/2015 Document Reviewed: 11/23/2014 ?Elsevier Interactive Patient Education ? 2017 [employer]. ? ?Fall Prevention in the Home ?Falls can cause injuries. They can happen to people of all ages. There are many things you can do to make your home safe and to help prevent falls. ?What can I do on the outside of my home? ?Regularly fix the edges of walkways and driveways and fix any cracks. ?Remove anything that might make you trip as you walk through a door, such as a raised step or threshold. ?Trim any bushes or trees on the path to your home. ?Use bright outdoor lighting. ?Clear any walking paths of anything that might make someone trip, such as rocks or tools. ?Regularly check to see if handrails are loose or broken. Make sure that both sides of any steps have handrails. ?Any raised decks and porches should have guardrails on the edges. ?Have any leaves, snow, or ice cleared regularly. ?Use sand or salt on walking paths during winter. ?Clean up any spills in your garage right away. This includes oil or grease spills. ?What can I do in the bathroom? ?Use night lights. ?Install grab bars by the toilet and in the tub and shower. Do not use towel bars as grab bars. ?Use non-skid mats or decals in the tub or shower. ?If you need to sit down in the shower, use a plastic, non-slip stool. ?Keep the floor dry. Clean up any water that spills on the floor as soon as it happens. ?Remove soap buildup in the tub or shower regularly. ?Attach bath mats securely with double-sided non-slip rug tape. ?Do not have throw rugs and other things on the floor that can make  you trip. ?What can I do in the bedroom? ?Use night lights. ?Make sure that you have a light by your bed that is easy to reach. ?Do not use any sheets or blankets that are too big for your bed. They should not hang down onto the floor. ?Have a firm chair that has side arms. You can use this for support while you get dressed. ?Do not have throw rugs and other things on the floor that can make you trip. ?What can I do in the kitchen? ?Clean up any spills right away. ?Avoid walking on wet floors. ?Keep items that you use a lot in easy-to-reach places. ?If you need to reach something above you, use a strong step stool that has a grab bar. ?Keep electrical cords out of the way. ?Do not use floor polish or wax that makes floors slippery. If you must use wax, use non-skid floor wax. ?Do not have throw rugs and other things on the floor that can make you trip. ?What can I do with my stairs? ?Do not leave any items on the stairs. ?Make sure that there are handrails on both sides of the stairs and use them. Fix handrails that are broken or loose. Make sure that handrails are as long as the stairways. ?Check any carpeting to make sure that it is firmly attached to the stairs. Fix any carpet that is loose  or worn. ?Avoid having throw rugs at the top or bottom of the stairs. If you do have throw rugs, attach them to the floor with carpet tape. ?Make sure that you have a light switch at the top of the stairs and the bottom of the stairs. If you do not have them, ask someone to add them for you. ?What else can I do to help prevent falls? ?Wear shoes that: ?Do not have high heels. ?Have rubber bottoms. ?Are comfortable and fit you well. ?Are closed at the toe. Do not wear sandals. ?If you use a stepladder: ?Make sure that it is fully opened. Do not climb a closed stepladder. ?Make sure that both sides of the stepladder are locked into place. ?Ask someone to hold it for you, if possible. ?Clearly mark and make sure that you can  see: ?Any grab bars or handrails. ?First and last steps. ?Where the edge of each step is. ?Use tools that help you move around (mobility aids) if they are needed. These include: ?Canes. ?Walkers. ?Scooters. ?Crutches

## 2021-05-02 DIAGNOSIS — M545 Low back pain, unspecified: Secondary | ICD-10-CM | POA: Diagnosis not present

## 2021-05-09 NOTE — Progress Notes (Signed)
?Charlann Boxer D.O. ?[facility] ?[place] ?Phone: [phone] ?Subjective:   ?I, [name], am serving as a Education administrator for Dr. Hulan Saas. ?This visit occurred during the SARS-CoV-2 public health emergency.  Safety protocols were in place, including screening questions prior to the visit, additional usage of staff PPE, and extensive cleaning of exam room while observing appropriate contact time as indicated for disinfecting solutions.  ? ?I'm seeing this patient by the request  of:  Marin Olp, MD ? ?CC: Low back pain ? ?ELF:[id number]  ?02/09/2021 ?We do believe the patient does have significant degenerative disc disease of the lumbar spine.  Discussed icing regimen and home exercise, discussed which activities to do which wants to avoid.  Increase activity slowly.  Discussed with patient I do think formal physical therapy would be beneficial for him.  Patient will be referred today as well.  Hold on any type of medications at the moment but may need to consider the possibility of gabapentin.  Also for any worsening symptoms or weakness advanced imaging would be warranted.  Patient will follow up with me again in 4 to 6 weeks. ? ?Update 05/10/2021 ?Ricky [name]. is a 76 y.o. male coming in with complaint of lumbar spine pain. Patient states back still hurts. Went to PT, but sees no significant improvement. Checking in to see if he needs to continue with PT. No new complaints. ? ? ? ?  ? ?Past Medical History:  ?Diagnosis Date  ? Arthritis   ? Carotid artery occlusion   ? Complication of anesthesia   ? slow to wake up after shoulder surgeries  ? Hyperlipidemia 03/08/2016  ? LDL 75 pre stroke. Goal <70 so on atorvastatin '20mg'$  every other day  ? Hypertension   ? Peripheral vascular disease ([place])   ? Pneumonia   ? Stroke [facility])   ? still has decreased sensation in left hand and arm (as of 03/09/16)  ? ?Past Surgical History:  ?Procedure Laterality Date  ? CAROTID  ENDARTERECTOMY    ? ENDARTERECTOMY Right 03/19/2016  ? Procedure: ENDARTERECTOMY CAROTID;  Surgeon: Elam Dutch, MD;  Location: [facility];  Service: Vascular;  Laterality: Right;  ? PATCH ANGIOPLASTY Right 03/19/2016  ? Procedure: PATCH ANGIOPLASTY OF RIGHT CAROTID USING HEMASHIELD PLATINUM FINESSE;  Surgeon: Elam Dutch, MD;  Location: [place];  Service: Vascular;  Laterality: Right;  ? SHOULDER SURGERY Bilateral   ? left- injections intermittent now  Rotator cuff repair on both shoulders, 2 different surgeries  ? TONSILLECTOMY    ? TOTAL KNEE ARTHROPLASTY Left 12/14/2013  ? Procedure: LEFT TOTAL KNEE ARTHROPLASTY;  Surgeon: Gearlean Alf, MD;  Location: [facility];  Service: Orthopedics;  Laterality: Left;  ? ?Social History  ? ?Socioeconomic History  ? Marital status: Married  ?  Spouse name: Not on file  ? Number of children: Not on file  ? Years of education: Not on file  ? Highest education level: Not on file  ?Occupational History  ? Occupation: Retired  ?Tobacco Use  ? Smoking status: Never  ? Smokeless tobacco: Never  ?Substance and Sexual Activity  ? Alcohol use: No  ?  Comment: former drinker  ? Drug use: No  ? Sexual activity: Yes  ?Other Topics Concern  ? Not on file  ?Social History Narrative  ? Married. 1 son. 3 grandkids- oldest just started college  ?   ? 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   ?   ?  Hobbies: gym daily- riding exercise bike plus weights/machines, photography- used to do a lot of black and white, gardening  ? ?Social Determinants of Health  ? ?Financial Resource Strain: Low Risk   ? Difficulty of Paying Living Expenses: Not hard at all  ?Food Insecurity: No Food Insecurity  ? Worried About Charity fundraiser in the Last Year: Never true  ? Ran Out of Food in the Last Year: Never true  ?Transportation Needs: No Transportation Needs  ? Lack of Transportation (Medical): No  ? Lack of Transportation (Non-Medical): No  ?Physical Activity: Sufficiently Active  ? Days of  Exercise per Week: 3 days  ? Minutes of Exercise per Session: 90 min  ?Stress: No Stress Concern Present  ? Feeling of Stress : Not at all  ?Social Connections: Moderately Isolated  ? Frequency of Communication with Friends and Family: Twice a week  ? Frequency of Social Gatherings with Friends and Family: More than three times a week  ? Attends Religious Services: Never  ? Active Member of Clubs or Organizations: No  ? Attends Archivist Meetings: Never  ? Marital Status: Married  ? ?Allergies  ?Allergen Reactions  ? Amoxicillin Rash  ?  Has patient had a PCN reaction causing immediate rash, facial/tongue/throat swelling, SOB or lightheadedness with hypotension:unsure ?Has patient had a PCN reaction causing severe rash involving mucus membranes or skin necrosis:unsure ?Has patient had a PCN reaction that required hospitalization No ?Has patient had a PCN reaction occurring within the last 10 years: No ?If all of the above answers are "NO", then may proceed with Cephalosporin use. ?  ? ?Family History  ?Problem Relation Age of Onset  ? Heart disease Father   ?     in 38s- specifics unclear  ? Other Mother   ?     passed in late 40s  ? ? ? ?Current Outpatient Medications (Cardiovascular):  ?  amLODipine (NORVASC) 2.5 MG tablet, Take 1 tablet (2.5 mg total) by mouth daily. ?  amLODipine (NORVASC) 5 MG tablet, Take 1 tablet (5 mg total) by mouth daily. ?  atorvastatin (LIPITOR) 20 MG tablet, TAKE 1 TABLET BY MOUTH EVERY OTHER DAY ? ?Current Outpatient Medications (Respiratory):  ?  albuterol (VENTOLIN HFA) 108 (90 Base) MCG/ACT inhaler, Inhale 2 puffs into the lungs every 6 (six) hours as needed for wheezing or shortness of breath. ? ?Current Outpatient Medications (Analgesics):  ?  acetaminophen (TYLENOL) 500 MG tablet, Take 500-1,000 mg by mouth every 6 (six) hours as needed (for pain.).  ?  aspirin EC 325 MG tablet, Take 1 tablet (325 mg total) by mouth daily. ? ? ?Current Outpatient Medications (Other):   ?  gabapentin (NEURONTIN) 100 MG capsule, Take 1 capsule (100 mg total) by mouth at bedtime. ?  Multiple Vitamin (MULTIVITAMIN WITH MINERALS) TABS tablet, Take 1 tablet by mouth daily. ? ? ?Reviewed prior external information including notes and imaging from  ?primary care provider ?As well as notes that were available from care everywhere and other healthcare systems. ? ?Past medical history, social, surgical and family history all reviewed in electronic medical record.  No pertanent information unless stated regarding to the chief complaint.  ? ?Review of Systems: ? No headache, visual changes, nausea, vomiting, diarrhea, constipation, dizziness, abdominal pain, skin rash, fevers, chills, night sweats, weight loss, swollen lymph nodes, joint swelling, chest pain, shortness of breath, mood changes. POSITIVE muscle aches, body aches ? ?Objective  ?Blood pressure 130/70, pulse 62, height '5\' 5"'$  (  1.651 m), weight 136 lb (61.7 kg), SpO2 98 %. ?  ?General: No apparent distress alert and oriented x3 mood and affect normal, dressed appropriately.  ?HEENT: Pupils equal, extraocular movements intact  ?Respiratory: Patient's speak in full sentences and does not appear short of breath  ?Cardiovascular: No lower extremity edema, non tender, no erythema  ?Gait mild antalgic ?Back exam does have some loss of lordosis.  Tightness with FABER test and straight leg test bilaterally.  Only 5 degrees of extension of the back noted.  Mild degenerative scoliosis noted. ?  ?Impression and Recommendations:  ?  ? ?The above documentation has been reviewed and is accurate and complete Lyndal Pulley, DO ? ? ? ?

## 2021-05-10 ENCOUNTER — Ambulatory Visit (INDEPENDENT_AMBULATORY_CARE_PROVIDER_SITE_OTHER): Payer: Medicare Other | Admitting: Family Medicine

## 2021-05-10 VITALS — BP 130/70 | HR 62 | Ht 65.0 in | Wt 136.0 lb

## 2021-05-10 DIAGNOSIS — I6521 Occlusion and stenosis of right carotid artery: Secondary | ICD-10-CM

## 2021-05-10 DIAGNOSIS — M5136 Other intervertebral disc degeneration, lumbar region: Secondary | ICD-10-CM

## 2021-05-10 MED ORDER — GABAPENTIN 100 MG PO CAPS: 100.0000 mg | ORAL_CAPSULE | Freq: Every day | ORAL | 0 refills | Status: DC

## 2021-05-10 NOTE — Patient Instructions (Addendum)
[facility] [phone] ?Call Today ? ?When we receive your results we will contact you. ? ?Gabapentin '100mg'$  at night ? ?

## 2021-05-10 NOTE — Assessment & Plan Note (Signed)
Continues to have significant back pain.  Patient's wife has recently been diagnosed with cancer as well and is concerned because there is a chance he may have to be doing more lifting and other activities around the house.  Do feel that an MRI is necessary at this point especially with patient's symptoms consistent with more of the possibility of spinal stenosis and is consistent with the L4-L5 spinal thesis noted.  Patient was given gabapentin to help with some nighttime pain as well.   discussed with patient about icing regimen and home exercises.  Increase activity slowly.  Follow-up again in 6 to 8 weeks. ?

## 2021-05-11 ENCOUNTER — Encounter: Payer: Self-pay | Admitting: Family Medicine

## 2021-05-16 ENCOUNTER — Other Ambulatory Visit: Payer: Medicare Other

## 2021-05-19 ENCOUNTER — Ambulatory Visit
Admission: RE | Admit: 2021-05-19 | Discharge: 2021-05-19 | Disposition: A | Discharge status: Home / Self Care | Payer: Medicare Other | Source: Ambulatory Visit | Attending: Family Medicine | Admitting: Family Medicine

## 2021-05-19 DIAGNOSIS — M545 Low back pain, unspecified: Secondary | ICD-10-CM | POA: Diagnosis not present

## 2021-05-19 DIAGNOSIS — M48061 Spinal stenosis, lumbar region without neurogenic claudication: Secondary | ICD-10-CM | POA: Diagnosis not present

## 2021-05-19 DIAGNOSIS — M2578 Osteophyte, vertebrae: Secondary | ICD-10-CM | POA: Diagnosis not present

## 2021-05-19 DIAGNOSIS — M5136 Other intervertebral disc degeneration, lumbar region: Secondary | ICD-10-CM

## 2021-05-23 ENCOUNTER — Encounter: Payer: Self-pay | Admitting: Family Medicine

## 2021-05-26 ENCOUNTER — Other Ambulatory Visit: Payer: Self-pay

## 2021-05-26 DIAGNOSIS — M5416 Radiculopathy, lumbar region: Secondary | ICD-10-CM

## 2021-06-05 ENCOUNTER — Ambulatory Visit
Admission: RE | Admit: 2021-06-05 | Discharge: 2021-06-05 | Disposition: A | Discharge status: Home / Self Care | Payer: Medicare Other | Source: Ambulatory Visit | Attending: Family Medicine | Admitting: Family Medicine

## 2021-06-05 DIAGNOSIS — M5416 Radiculopathy, lumbar region: Secondary | ICD-10-CM

## 2021-06-05 MED ORDER — IOPAMIDOL (ISOVUE-M 200) INJECTION 41%
1.0000 mL | Freq: Once | INTRAMUSCULAR | Status: AC
  Administered 2021-06-05: 1 mL via EPIDURAL

## 2021-06-05 MED ORDER — METHYLPREDNISOLONE ACETATE 40 MG/ML INJ SUSP (RADIOLOG
80.0000 mg | Freq: Once | INTRAMUSCULAR | Status: AC
  Administered 2021-06-05: 80 mg via EPIDURAL

## 2021-06-05 NOTE — Discharge Instructions (Signed)
Post Procedure Spinal Discharge Instruction Sheet ? ?You may resume a regular diet and any medications that you routinely take (including pain medications) unless otherwise noted by MD. ? ?No driving day of procedure. ? ?Light activity throughout the rest of the day.  Do not do any strenuous work, exercise, bending or lifting.  The day following the procedure, you can resume normal physical activity but you should refrain from exercising or physical therapy for at least three days thereafter. ? ?You may apply ice to the injection site, 20 minutes on, 20 minutes off, as needed. Do not apply ice directly to skin.  ? ? ?Common Side Effects: ? ?Headaches- take your usual medications as directed by your physician.  Increase your fluid intake.  Caffeinated beverages may be helpful.  Lie flat in bed until your headache resolves. ? ?Restlessness or inability to sleep- you may have trouble sleeping for the next few days.  Ask your referring physician if you need any medication for sleep. ? ?Facial flushing or redness- should subside within a few days. ? ?Increased pain- a temporary increase in pain a day or two following your procedure is not unusual.  Take your pain medication as prescribed by your referring physician. ? ?Leg cramps ? ?Please contact our office at [phone] for the following symptoms: ?Fever greater than 100 degrees. ?Headaches unresolved with medication after 2-3 days. ?Increased swelling, pain, or redness at injection site. ? ? ?Thank you for visiting [facility] today.  ? ?MAY RESUME ASPIRIN IMMEDIATELY AFTER PROCEDURE!  ?

## 2021-06-11 ENCOUNTER — Encounter: Payer: Self-pay | Admitting: Family Medicine

## 2021-06-12 ENCOUNTER — Other Ambulatory Visit: Payer: Self-pay

## 2021-06-12 MED ORDER — AMLODIPINE BESYLATE 5 MG PO TABS: 5.0000 mg | ORAL_TABLET | Freq: Every day | ORAL | 3 refills | Status: DC

## 2021-06-12 MED ORDER — AMLODIPINE BESYLATE 2.5 MG PO TABS: 2.5000 mg | ORAL_TABLET | Freq: Every day | ORAL | 1 refills | Status: DC

## 2021-06-29 NOTE — Progress Notes (Signed)
[name] Phone: [phone] Subjective:   [name], am serving as a scribe for Dr. Hulan Saas.   I'm seeing this patient by the request  of:  Marin Olp, MD  CC: Low back pain,  WVP:[id number]  05/10/2021 Continues to have significant back pain.  Patient's wife has recently been diagnosed with cancer as well and is concerned because there is a chance he may have to be doing more lifting and other activities around the house.  Do feel that an MRI is necessary at this point especially with patient's symptoms consistent with more of the possibility of spinal stenosis and is consistent with the L4-L5 spinal thesis noted.  Patient was given gabapentin to help with some nighttime pain as well.   discussed with patient about icing regimen and home exercises.  Increase activity slowly.  Follow-up again in 6 to 8 weeks.  Update 07/04/2021 Ricky [name]. is a 76 y.o. male coming in with complaint of lumbar DDD. Epidural on 06/05/2021. Patient states that epidural was not as helpful as he hoped. If patient does any yardwork or vacuuming he still has the same level of pain. Taking gabapentin but is unsure why he is using this medication. Pain worse in mornings but he is unsure if gabapentin is helping his symptoms or if epidural is helping. Taking '100mg'$  gabapentin.   Also notes that he fell months ago on L knee. History of TKR. States that soreness has not improved. Also states that he feels that since TKR he has had leg length discrepancy and that this contributes to his back pain. Does use a heel lift in R shoe.        Past Medical History:  Diagnosis Date   Arthritis    Carotid artery occlusion    Complication of anesthesia    slow to wake up after shoulder surgeries   Hyperlipidemia 03/08/2016   LDL 75 pre stroke. Goal <70 so on atorvastatin '20mg'$  every other day   Hypertension    Peripheral vascular  disease ([place])    Pneumonia    Stroke [facility])    still has decreased sensation in left hand and arm (as of 03/09/16)   Past Surgical History:  Procedure Laterality Date   CAROTID ENDARTERECTOMY     ENDARTERECTOMY Right 03/19/2016   Procedure: ENDARTERECTOMY CAROTID;  Surgeon: Elam Dutch, MD;  Location: [facility];  Service: Vascular;  Laterality: Right;   PATCH ANGIOPLASTY Right 03/19/2016   Procedure: PATCH ANGIOPLASTY OF RIGHT CAROTID USING HEMASHIELD PLATINUM FINESSE;  Surgeon: Elam Dutch, MD;  Location: [facility];  Service: Vascular;  Laterality: Right;   SHOULDER SURGERY Bilateral    left- injections intermittent now  Rotator cuff repair on both shoulders, 2 different surgeries   TONSILLECTOMY     TOTAL KNEE ARTHROPLASTY Left 12/14/2013   Procedure: LEFT TOTAL KNEE ARTHROPLASTY;  Surgeon: Gearlean Alf, MD;  Location: [facility];  Service: Orthopedics;  Laterality: Left;   Social History   Socioeconomic History   Marital status: Married    Spouse name: Not on file   Number of children: Not on file   Years of education: Not on file   Highest education level: Not on file  Occupational History   Occupation: Retired  Tobacco Use   Smoking status: Never   Smokeless tobacco: Never  Substance and Sexual Activity   Alcohol use: No    Comment: former drinker  Drug use: No   Sexual activity: Yes  Other Topics Concern   Not on file  Social History Narrative   Married. 1 son. 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       Hobbies: gym daily- riding exercise bike plus weights/machines, photography- used to do a lot of black and white, gardening   Social Determinants of Health   Financial Resource Strain: Low Risk    Difficulty of Paying Living Expenses: Not hard at all  Food Insecurity: No Food Insecurity   Worried About Charity fundraiser in the Last Year: Never true   [place] in the Last Year: Never true   Transportation Needs: No Transportation Needs   Lack of Transportation (Medical): No   Lack of Transportation (Non-Medical): No  Physical Activity: Sufficiently Active   Days of Exercise per Week: 3 days   Minutes of Exercise per Session: 90 min  Stress: No Stress Concern Present   Feeling of Stress : Not at all  Social Connections: Moderately Isolated   Frequency of Communication with Friends and Family: Twice a week   Frequency of Social Gatherings with Friends and Family: More than three times a week   Attends Religious Services: Never   Marine scientist or Organizations: No   Attends Music therapist: Never   Marital Status: Married   Allergies  Allergen Reactions   Amoxicillin Rash    Has patient had a PCN reaction causing immediate rash, facial/tongue/throat swelling, SOB or lightheadedness with hypotension:unsure Has patient had a PCN reaction causing severe rash involving mucus membranes or skin necrosis:unsure Has patient had a PCN reaction that required hospitalization No Has patient had a PCN reaction occurring within the last 10 years: No If all of the above answers are "NO", then may proceed with Cephalosporin use.    Family History  Problem Relation Age of Onset   Heart disease Father        in 34s- specifics unclear   Other Mother        passed in late 79s     Current Outpatient Medications (Cardiovascular):    amLODipine (NORVASC) 2.5 MG tablet, Take 1 tablet (2.5 mg total) by mouth daily.   amLODipine (NORVASC) 5 MG tablet, Take 1 tablet (5 mg total) by mouth daily.   atorvastatin (LIPITOR) 20 MG tablet, TAKE 1 TABLET BY MOUTH EVERY OTHER DAY  Current Outpatient Medications (Respiratory):    albuterol (VENTOLIN HFA) 108 (90 Base) MCG/ACT inhaler, Inhale 2 puffs into the lungs every 6 (six) hours as needed for wheezing or shortness of breath.  Current Outpatient Medications (Analgesics):    acetaminophen (TYLENOL) 500 MG tablet, Take  500-1,000 mg by mouth every 6 (six) hours as needed (for pain.).    aspirin EC 325 MG tablet, Take 1 tablet (325 mg total) by mouth daily.   Current Outpatient Medications (Other):    gabapentin (NEURONTIN) 100 MG capsule, Take 2 capsules (200 mg total) by mouth 2 (two) times daily.   Multiple Vitamin (MULTIVITAMIN WITH MINERALS) TABS tablet, Take 1 tablet by mouth daily.   Reviewed prior external information including notes and imaging from  primary care provider As well as notes that were available from care everywhere and other healthcare systems.  Past medical history, social, surgical and family history all reviewed in electronic medical record.  No pertanent information unless stated regarding to the chief complaint.  Review of Systems:  No headache, visual changes, nausea, vomiting, diarrhea, constipation, dizziness, abdominal pain, skin rash, fevers, chills, night sweats, weight loss, swollen lymph nodes, body aches, joint swelling, chest pain, shortness of breath, mood changes. POSITIVE muscle aches  Objective  Blood pressure 138/80, pulse 63, height '5\' 5"'$  (1.651 m), weight 137 lb (62.1 kg), SpO2 99 %.   General: No apparent distress alert and oriented x3 mood and affect normal, dressed appropriately.  HEENT: Pupils equal, extraocular movements intact  Respiratory: Patient's speak in full sentences and does not appear short of breath  Cardiovascular: No lower extremity edema, non tender, no erythema  Gait antalgic gait noted MSK: Patient does have leg length discrepancy noted with the right leg being shorter.  Patient does have varus deformity of the knees and legs bilaterally.  Patient does have some tenderness to palpation minorly over the left knee that is replacement with no significant swelling0.  Range of motion with 100 degrees of flexion.  Neurovascularly intact distally.  Back exam no has only 5 degrees of extension.  Does have very mild atrophy of the thighs  bilaterally.    Impression and Recommendations:    The above documentation has been reviewed and is accurate and complete Lyndal Pulley, DO

## 2021-07-03 ENCOUNTER — Encounter: Payer: Self-pay | Admitting: Family Medicine

## 2021-07-04 ENCOUNTER — Ambulatory Visit (INDEPENDENT_AMBULATORY_CARE_PROVIDER_SITE_OTHER): Payer: Medicare Other | Admitting: Family Medicine

## 2021-07-04 ENCOUNTER — Ambulatory Visit (INDEPENDENT_AMBULATORY_CARE_PROVIDER_SITE_OTHER): Payer: Medicare Other

## 2021-07-04 VITALS — BP 138/80 | HR 63 | Ht 65.0 in | Wt 137.0 lb

## 2021-07-04 DIAGNOSIS — G8929 Other chronic pain: Secondary | ICD-10-CM | POA: Diagnosis not present

## 2021-07-04 DIAGNOSIS — I6521 Occlusion and stenosis of right carotid artery: Secondary | ICD-10-CM | POA: Diagnosis not present

## 2021-07-04 DIAGNOSIS — M25562 Pain in left knee: Secondary | ICD-10-CM

## 2021-07-04 DIAGNOSIS — M2170 Unequal limb length (acquired), unspecified site: Secondary | ICD-10-CM | POA: Diagnosis not present

## 2021-07-04 DIAGNOSIS — M5136 Other intervertebral disc degeneration, lumbar region: Secondary | ICD-10-CM | POA: Diagnosis not present

## 2021-07-04 DIAGNOSIS — S8992XA Unspecified injury of left lower leg, initial encounter: Secondary | ICD-10-CM | POA: Diagnosis not present

## 2021-07-04 MED ORDER — GABAPENTIN 100 MG PO CAPS: 200.0000 mg | ORAL_CAPSULE | Freq: Two times a day (BID) | ORAL | 1 refills | Status: DC

## 2021-07-04 NOTE — Patient Instructions (Addendum)
[place] [phone] Call Today  When we receive your results we will contact you.  Gabapentin '200mg'$  at night for a week and if you need more add '100mg'$  during the day and '200mg'$  at night  See you again in 6-8 weeks after injections

## 2021-07-04 NOTE — Assessment & Plan Note (Signed)
Severe degenerative changes noted with spinal stenosis.  Patient has had some improvement with the injection but overall does not think he is making great progress.  Patient still be a primary caregiver for his wife who recently had surgery.  Discussed increasing gabapentin because he continues to have the symptoms but warned of potential side effects.  We will start with 200 mg at night with the idea of potentially increasing to daytime dosing.  Follow-up with me after another epidural as well and see how patient is responding.

## 2021-07-04 NOTE — Assessment & Plan Note (Signed)
Secondary to left-sided knee replacement.  He still has a leg length discrepancy.  Discussed the possibility of a new heel lift.  We will get x-rays of the left knee but I do feel that the leg pain is more secondary to lumbar radiculopathy.  Patient understands follow-up with me again in 6 to 8 weeks otherwise.

## 2021-07-05 ENCOUNTER — Encounter: Payer: Self-pay | Admitting: Family Medicine

## 2021-07-06 DIAGNOSIS — Z23 Encounter for immunization: Secondary | ICD-10-CM | POA: Diagnosis not present

## 2021-07-12 ENCOUNTER — Ambulatory Visit
Admission: RE | Admit: 2021-07-12 | Discharge: 2021-07-12 | Disposition: A | Discharge status: Home / Self Care | Payer: Medicare Other | Source: Ambulatory Visit | Attending: Family Medicine | Admitting: Family Medicine

## 2021-07-12 DIAGNOSIS — M5116 Intervertebral disc disorders with radiculopathy, lumbar region: Secondary | ICD-10-CM | POA: Diagnosis not present

## 2021-07-12 DIAGNOSIS — M5136 Other intervertebral disc degeneration, lumbar region: Secondary | ICD-10-CM

## 2021-07-12 MED ORDER — IOPAMIDOL (ISOVUE-M 200) INJECTION 41%
1.0000 mL | Freq: Once | INTRAMUSCULAR | Status: AC
  Administered 2021-07-12: 1 mL via EPIDURAL

## 2021-07-12 MED ORDER — METHYLPREDNISOLONE ACETATE 40 MG/ML INJ SUSP (RADIOLOG
80.0000 mg | Freq: Once | INTRAMUSCULAR | Status: AC
  Administered 2021-07-12: 80 mg via EPIDURAL

## 2021-07-12 NOTE — Discharge Instructions (Signed)

## 2021-08-25 ENCOUNTER — Encounter: Payer: Self-pay | Admitting: Family Medicine

## 2021-08-25 ENCOUNTER — Ambulatory Visit (INDEPENDENT_AMBULATORY_CARE_PROVIDER_SITE_OTHER): Payer: Medicare Other | Admitting: Family Medicine

## 2021-08-25 VITALS — BP 134/76 | HR 51 | Temp 97.8°F | Resp 16 | Ht 65.0 in | Wt 138.8 lb

## 2021-08-25 DIAGNOSIS — I6521 Occlusion and stenosis of right carotid artery: Secondary | ICD-10-CM

## 2021-08-25 DIAGNOSIS — D72819 Decreased white blood cell count, unspecified: Secondary | ICD-10-CM | POA: Diagnosis not present

## 2021-08-25 DIAGNOSIS — J439 Emphysema, unspecified: Secondary | ICD-10-CM | POA: Diagnosis not present

## 2021-08-25 DIAGNOSIS — R739 Hyperglycemia, unspecified: Secondary | ICD-10-CM | POA: Insufficient documentation

## 2021-08-25 DIAGNOSIS — I10 Essential (primary) hypertension: Secondary | ICD-10-CM | POA: Diagnosis not present

## 2021-08-25 DIAGNOSIS — D692 Other nonthrombocytopenic purpura: Secondary | ICD-10-CM | POA: Diagnosis not present

## 2021-08-25 DIAGNOSIS — E785 Hyperlipidemia, unspecified: Secondary | ICD-10-CM | POA: Diagnosis not present

## 2021-08-25 LAB — COMPREHENSIVE METABOLIC PANEL
ALT: 25 U/L (ref 0–53)
AST: 32 U/L (ref 0–37)
Albumin: 4.1 g/dL (ref 3.5–5.2)
Alkaline Phosphatase: 57 U/L (ref 39–117)
BUN: 25 mg/dL — ABNORMAL HIGH (ref 6–23)
CO2: 29 mEq/L (ref 19–32)
Calcium: 9.1 mg/dL (ref 8.4–10.5)
Chloride: 105 mEq/L (ref 96–112)
Creatinine, Ser: 0.67 mg/dL (ref 0.40–1.50)
GFR: 89.91 mL/min (ref >60.00)
Glucose, Bld: 96 mg/dL (ref 70–99)
Potassium: 4.1 mEq/L (ref 3.5–5.1)
Sodium: 140 mEq/L (ref 135–145)
Total Bilirubin: 0.6 mg/dL (ref 0.2–1.2)
Total Protein: 6.5 g/dL (ref 6.0–8.3)

## 2021-08-25 LAB — CBC WITH DIFFERENTIAL/PLATELET
Basophils Absolute: 0 10*3/uL (ref 0.0–0.1)
Basophils Relative: 0.5 % (ref 0.0–3.0)
Eosinophils Absolute: 0.1 10*3/uL (ref 0.0–0.7)
Eosinophils Relative: 2.7 % (ref 0.0–5.0)
HCT: 39.3 % (ref 39.0–52.0)
Hemoglobin: 13.2 g/dL (ref 13.0–17.0)
Lymphocytes Relative: 21.3 % (ref 12.0–46.0)
Lymphs Abs: 0.9 10*3/uL (ref 0.7–4.0)
MCHC: 33.6 g/dL (ref 30.0–36.0)
MCV: 97.2 fl (ref 78.0–100.0)
Monocytes Absolute: 0.4 10*3/uL (ref 0.1–1.0)
Monocytes Relative: 10.4 % (ref 3.0–12.0)
Neutro Abs: 2.7 10*3/uL (ref 1.4–7.7)
Neutrophils Relative %: 65.1 % (ref 43.0–77.0)
Platelets: 214 10*3/uL (ref 150.0–400.0)
RBC: 4.04 Mil/uL — ABNORMAL LOW (ref 4.22–5.81)
RDW: 13.7 % (ref 11.5–15.5)
WBC: 4.1 10*3/uL (ref 4.0–10.5)

## 2021-08-25 LAB — HEMOGLOBIN A1C: Hgb A1c MFr Bld: 5.9 % (ref 4.6–6.5)

## 2021-08-25 NOTE — Progress Notes (Signed)
Phone [phone] In person visit   Subjective:   Noel Rodier. is a 76 y.o. year old very pleasant male patient who presents for/with See problem oriented charting Chief Complaint  Patient presents with   Hypertension   Hyperlipidemia   Back Pain    Lower right side, currently sees Dr. Tamala Julian    Emphysema   reduced urine flow    First thing in the morning and gradually gets better during day time    Past Medical History-  Patient Active Problem List   Diagnosis Date Noted   Hemiparesis affecting dominant side as late effect of stroke ([place]) 03/08/2016    Priority: High   History of CVA (cerebrovascular accident) 02/28/2016    Priority: High   Emphysema lung ([place]) 08/25/2021    Priority: Medium    Hyperglycemia 08/25/2021    Priority: Medium    Carotid artery stenosis, symptomatic, right 03/19/2016    Priority: Medium    Hyperlipidemia 03/08/2016    Priority: Medium    Essential hypertension 12/20/2014    Priority: Medium    Senile purpura ([place]) 08/25/2021    Priority: Low   Degenerative disc disease, lumbar 02/09/2021    Priority: Low   Acquired leg length discrepancy 02/09/2021    Priority: Low   Leukopenia 01/08/2017    Priority: Low   Trigger middle finger of left hand 11/12/2016    Priority: Low   OA (osteoarthritis) of knee 12/14/2013    Priority: Low    Medications- reviewed and updated Current Outpatient Medications  Medication Sig Dispense Refill   acetaminophen (TYLENOL) 500 MG tablet Take 500-1,000 mg by mouth every 6 (six) hours as needed (for pain.).      albuterol (VENTOLIN HFA) 108 (90 Base) MCG/ACT inhaler Inhale 2 puffs into the lungs every 6 (six) hours as needed for wheezing or shortness of breath. 1 each 0   amLODipine (NORVASC) 2.5 MG tablet Take 1 tablet (2.5 mg total) by mouth daily. 90 tablet 1   amLODipine (NORVASC) 5 MG tablet Take 1 tablet (5 mg total) by mouth daily. 90 tablet 3   aspirin EC 325 MG tablet Take 1 tablet (325 mg  total) by mouth daily. 30 tablet 0   atorvastatin (LIPITOR) 20 MG tablet TAKE 1 TABLET BY MOUTH EVERY OTHER DAY 45 tablet 2   gabapentin (NEURONTIN) 100 MG capsule Take 2 capsules (200 mg total) by mouth 2 (two) times daily. 120 capsule 1   Multiple Vitamin (MULTIVITAMIN WITH MINERALS) TABS tablet Take 1 tablet by mouth daily.     No current facility-administered medications for this visit.     Objective:  BP 134/76   Pulse (!) 51   Temp 97.8 F (36.6 C) (Temporal)   Resp 16   Ht '5\' 5"'$  (1.651 m)   Wt 138 lb 12.8 oz (63 kg)   SpO2 97%   BMI 23.10 kg/m  Gen: NAD, resting comfortably CV: RRR no murmurs rubs or gallops Lungs: CTAB no crackles, wheeze, rhonchi Ext: trace edema Skin: warm, dry     Assessment and Plan   #social update- wife Mikle Bosworth working with Dr. Burr Medico on cancer- has had several treatments. He has had to provide a lot of caregiving  #Orthopedic concerns  1.  Back pain-continue to work with Dr. Maricela Curet low-dose gabapentin-he wants to be able to help his wife who was diagnosed with cancer-had seen Dr. Belia Heman for months prior to this.  PT, 2 epidurals- havent helped.  3.  Also has  been evaluated for knee pain-has seen Dr. Elmyra Ricks in the past  #hypertension S: medication: Amlodipine 7.5 mg Home readings #s: has home cuff - regularly checks and brings sheet today- average well below 135/85 Exercise-has been harder to exercise with wife's health- still making it 3 x a week but had been 5 BP Readings from Last 3 Encounters:  08/25/21 134/76  07/12/21 (!) 127/58  07/04/21 138/80  A/P: Controlled. Continue current medications.   # history of CVA with hemiparesis affecting dominant side (left) as a result  # carotid stenosis-  carotid stenosis of right side status post endarterectomy on the right with 1-39% on the left followed by Dr. Oneida Alar- most recently 12/19/20 - now Dr. Virl Cagey #hyperlipidemia - LDL goal below 70 S: Medication:Atorvastatin 20 mg every other  day, aspirin 325 mg  - occasionally drops objects with left hand (left handed) - some clumsiness he describes still Lab Results  Component Value Date   CHOL 112 02/17/2021   HDL 63.40 02/17/2021   LDLCALC 40 02/17/2021   LDLDIRECT 44.0 08/03/2019   TRIG 41.0 02/17/2021   CHOLHDL 2 02/17/2021   A/P: Lipids excellently controlled with LDL well under 70-continue current medication.  Continue aspirin with stroke history -carotid stenosis stable on scan 12/19/20- continue risk factor modification  # Hyperglycemia/insulin resistance/prediabetes- peak a1c 103 08/03/19.  Peak A1c 5.9 February 17, 2020 S:  Medication: none  Exercise and diet- exercise down some but has maintained weight  Lab Results  Component Value Date   HGBA1C 5.9 02/17/2021   HGBA1C 5.7 08/16/2020   HGBA1C 5.9 02/17/2020  A/P: hopefully stable or improved- update a1c today. Continue without meds for now   #Mild leukopenia-other cell lines were normal until last visit when he had slight anemia as well as slightly low platelets.Plan at last visit was CBC with differential as well as pathology smear review - largely reassuring then reverted to primarily leukopenia Lab Results  Component Value Date   WBC 3.6 (L) 02/17/2021   HGB 13.6 02/17/2021   HCT 41.0 02/17/2021   MCV 96.4 02/17/2021   PLT 231.0 02/17/2021   #emphysema- incidental finding on imaging - largely asymptomatic  -has albuterol available if needed  #nocturia- we did a later PSA due to worsening issues and low risk PSA trend (low greatest trend). Nitrites in urie- ordered culture 02/17/20 but no gross -Reduced urinary flow-notes distressing in the morning gradually yesterday and the day- ongoing increased frequency . Rare incontinence if rushing to bathroom -offered trial of flomax Lab Results  Component Value Date   PSA 0.46 02/17/2021   PSA 0.87 08/03/2019   PSA 1.22 11/30/2013   #senile purpura- noted. Stable. Check cbc at least annually. Diona Fanti  contributes  Recommended follow up: Return in about 6 months (around 02/25/2022) for followup or sooner if needed.Schedule b4 you leave. Future Appointments  Date Time Provider [place]  08/30/2021  9:15 AM Lyndal Pulley, DO [facility] None  05/04/2022 10:30 AM [facility] HEALTH COACH [facility]   Lab/Order associations:   ICD-10-CM   1. Essential hypertension  I10     2. Hyperlipidemia, unspecified hyperlipidemia type  E78.5 Comprehensive metabolic panel    3. Leukopenia, unspecified type  D72.819 CBC w/Diff    4. Pulmonary emphysema, unspecified emphysema type ([place])  J43.9     5. Hyperglycemia  R73.9 HgB A1c    6. Senile purpura ([facility])  D69.2       No orders of the defined types were placed in  this encounter.   Return precautions advised.  Garret Reddish, MD

## 2021-08-25 NOTE — Patient Instructions (Addendum)
Please stop by lab before you go If you have mychart- we will send your results within 3 business days of [place] receiving them.  If you do not have mychart- we will call you about results within 5 business days of [place] receiving them.  *please also note that you will see labs on mychart as soon as they post. I will later go in and write notes on them- will say "notes from Dr. Yong Channel"   Recommended follow up: Return in about 6 months (around 02/25/2022) for followup or sooner if needed.Schedule b4 you leave.

## 2021-08-29 NOTE — Progress Notes (Unsigned)
[place] Phone: [phone] Subjective:   Ricky [name], am serving as a scribe for Dr. Hulan Saas.  I'm seeing this patient by the request  of:  Marin Olp, MD  CC: Back pain follow-up  MAU:[id number]  07/04/2021 Secondary to left-sided knee replacement.  He still has a leg length discrepancy.  Discussed the possibility of a new heel lift.  We will get x-rays of the left knee but I do feel that the leg pain is more secondary to lumbar radiculopathy.  Patient understands follow-up with me again in 6 to 8 weeks otherwise.  Severe degenerative changes noted with spinal stenosis.  Patient has had some improvement with the injection but overall does not think he is making great progress.  Patient still be a primary caregiver for his wife who recently had surgery.  Discussed increasing gabapentin because he continues to have the symptoms but warned of potential side effects.  We will start with 200 mg at night with the idea of potentially increasing to daytime dosing.  Follow-up with me after another epidural as well and see how patient is responding.   Update 08/30/2021 Ricky [name]. is a 76 y.o. male coming in with complaint of L knee and back pain. Epidural 07/12/2021. Patient states that his first epidural helped somewhat but the second one has not given him much relief. Has to walk in flexed position to relieve his pain.   Pain in knee is intermittent. [name] worse than last visit.   Xray L knee 07/04/2021 IMPRESSION: 1. Osteopenia, degenerative and postsurgical changes without evidence of displaced fractures. 2. Small suprapatellar bursal effusion with nonspecific calcific debris. 3. Loose body in the posterosuperior joint space. 4. Calcific arteriosclerosis.    Past Medical History:  Diagnosis Date   Arthritis    Carotid artery occlusion    Complication of anesthesia    slow to wake up after  shoulder surgeries   Hyperlipidemia 03/08/2016   LDL 75 pre stroke. Goal <70 so on atorvastatin '20mg'$  every other day   Hypertension    Peripheral vascular disease ([place])    Pneumonia    Stroke [facility])    still has decreased sensation in left hand and arm (as of 03/09/16)   Past Surgical History:  Procedure Laterality Date   CAROTID ENDARTERECTOMY     ENDARTERECTOMY Right 03/19/2016   Procedure: ENDARTERECTOMY CAROTID;  Surgeon: Elam Dutch, MD;  Location: [place];  Service: Vascular;  Laterality: Right;   PATCH ANGIOPLASTY Right 03/19/2016   Procedure: PATCH ANGIOPLASTY OF RIGHT CAROTID USING HEMASHIELD PLATINUM FINESSE;  Surgeon: Elam Dutch, MD;  Location: [place];  Service: Vascular;  Laterality: Right;   SHOULDER SURGERY Bilateral    left- injections intermittent now  Rotator cuff repair on both shoulders, 2 different surgeries   TONSILLECTOMY     TOTAL KNEE ARTHROPLASTY Left 12/14/2013   Procedure: LEFT TOTAL KNEE ARTHROPLASTY;  Surgeon: Gearlean Alf, MD;  Location: [facility];  Service: Orthopedics;  Laterality: Left;   Social History   Socioeconomic History   Marital status: Married    Spouse name: Not on file   Number of children: Not on file   Years of education: Not on file   Highest education level: Not on file  Occupational History   Occupation: Retired  Tobacco Use   Smoking status: Never   Smokeless tobacco: Never  Substance and Sexual Activity   Alcohol use: [name]  Comment: former drinker   Drug use: [name]   Sexual activity: Yes  Other Topics Concern   Not on file  Social History Narrative   Married. 1 son. 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       Hobbies: gym daily- riding exercise bike plus weights/machines, photography- used to do a lot of black and white, gardening   Social Determinants of Health   Financial Resource Strain: Low Risk  (04/21/2021)   Overall Financial Resource Strain  (CARDIA)    Difficulty of Paying Living Expenses: Not hard at all  Food Insecurity: [name] Food Insecurity (04/21/2021)   Hunger Vital Sign    Worried About Running Out of Food in the Last Year: Never true    Ran Out of Food in the Last Year: Never true  Transportation Needs: [name] Transportation Needs (04/21/2021)   PRAPARE - Hydrologist (Medical): [name]    Lack of Transportation (Non-Medical): [name]  Physical Activity: Sufficiently Active (04/21/2021)   Exercise Vital Sign    Days of Exercise per Week: 3 days    Minutes of Exercise per Session: 90 min  Stress: [name] Stress Concern Present (04/21/2021)   [place]    Feeling of Stress : Not at all  Social Connections: Moderately Isolated (04/21/2021)   Social Connection and Isolation Panel [NHANES]    Frequency of Communication with Friends and Family: Twice a week    Frequency of Social Gatherings with Friends and Family: More than three times a week    Attends Religious Services: Never    Marine scientist or Organizations: [name]    Attends Music therapist: Never    Marital Status: Married   Allergies  Allergen Reactions   Amoxicillin Rash    Has patient had a PCN reaction causing immediate rash, facial/tongue/throat swelling, SOB or lightheadedness with hypotension:unsure Has patient had a PCN reaction causing severe rash involving mucus membranes or skin necrosis:unsure Has patient had a PCN reaction that required hospitalization [name] Has patient had a PCN reaction occurring within the last 10 years: [name] If all of the above answers are "[name]", then may proceed with Cephalosporin use.    Family History  Problem Relation Age of Onset   Heart disease Father        in 2s- specifics unclear   Other Mother        passed in late 60s     Current Outpatient Medications (Cardiovascular):    amLODipine (NORVASC) 2.5 MG tablet, Take 1 tablet  (2.5 mg total) by mouth daily.   amLODipine (NORVASC) 5 MG tablet, Take 1 tablet (5 mg total) by mouth daily.   atorvastatin (LIPITOR) 20 MG tablet, TAKE 1 TABLET BY MOUTH EVERY OTHER DAY  Current Outpatient Medications (Respiratory):    albuterol (VENTOLIN HFA) 108 (90 Base) MCG/ACT inhaler, Inhale 2 puffs into the lungs every 6 (six) hours as needed for wheezing or shortness of breath.  Current Outpatient Medications (Analgesics):    acetaminophen (TYLENOL) 500 MG tablet, Take 500-1,000 mg by mouth every 6 (six) hours as needed (for pain.).    aspirin EC 325 MG tablet, Take 1 tablet (325 mg total) by mouth daily.   Current Outpatient Medications (Other):    DULoxetine (CYMBALTA) 20 MG capsule, Take 1 capsule (20 mg total) by mouth daily.   gabapentin (NEURONTIN) 100 MG capsule, Take  2 capsules (200 mg total) by mouth 2 (two) times daily.   Multiple Vitamin (MULTIVITAMIN WITH MINERALS) TABS tablet, Take 1 tablet by mouth daily.   Reviewed prior external information including notes and imaging from  primary care provider As well as notes that were available from care everywhere and other healthcare systems.  Past medical history, social, surgical and family history all reviewed in electronic medical record.  [name] pertanent information unless stated regarding to the chief complaint.   Review of Systems:  [name] headache, visual changes, nausea, vomiting, diarrhea, constipation, dizziness, abdominal pain, skin rash, fevers, chills, night sweats, weight loss, swollen lymph nodes, body aches, joint swelling, chest pain, shortness of breath, mood changes. POSITIVE muscle aches  Objective  Blood pressure 138/84, pulse (!) 53, height '5\' 5"'$  (1.651 m), weight 137 lb (62.1 kg), SpO2 99 %.   General: [name] apparent distress alert and oriented x3 mood and affect normal, dressed appropriately.  HEENT: Pupils equal, extraocular movements intact  Respiratory: Patient's speak in full sentences and does not  appear short of breath  Cardiovascular: [name] lower extremity edema, non tender, [name] erythema  Severely antalgic gait noted.  Patient does have significant degenerative scoliosis noted of the back.  Tender to palpation in the paraspinal musculature as well. Patient does have some mild atrophy noted of the lower extremities.  4-5 strength of the lower extremities noted.  Patient does have some instability noted with a left knee replacement.  [name] significant swelling noted today though.   Impression and Recommendations:

## 2021-08-30 ENCOUNTER — Ambulatory Visit (INDEPENDENT_AMBULATORY_CARE_PROVIDER_SITE_OTHER): Payer: Medicare Other | Admitting: Family Medicine

## 2021-08-30 DIAGNOSIS — I6521 Occlusion and stenosis of right carotid artery: Secondary | ICD-10-CM

## 2021-08-30 DIAGNOSIS — M5136 Other intervertebral disc degeneration, lumbar region: Secondary | ICD-10-CM

## 2021-08-30 MED ORDER — DULOXETINE HCL 20 MG PO CPEP: 20.0000 mg | ORAL_CAPSULE | Freq: Every day | ORAL | 0 refills | Status: DC

## 2021-08-30 NOTE — Patient Instructions (Signed)
Cymbalta '20mg'$  Call if any side effects Continue to be active  Check back in 4-6 weeks

## 2021-08-30 NOTE — Assessment & Plan Note (Signed)
Significant degenerative disc disease of the lumbar spine.  Patient did not respond as well to the epidural as we anticipated this time.  Is having pain that is affecting daily activities.  Continuing to have some left knee pain but very minimal compared to what it was previously.  Patient does state that the pain does affect daily activities and sometimes very difficult to start walking after sleeping or sitting for long amount of time.  Has noticed more discomfort and more of a flexed state when walking the dog.  We discussed different treatment options.  Patient's wife is undergoing chemotherapy so we will hold on any type of surgical intervention but would consider the possibility of Cymbalta.  Started him on 20 mg.  Warned of potential side effects.  Patient will follow-up with me again in 4 to 6 weeks and see if we need to increase to 30 mg.

## 2021-09-06 ENCOUNTER — Encounter: Payer: Self-pay | Admitting: Family Medicine

## 2021-09-21 ENCOUNTER — Other Ambulatory Visit: Payer: Self-pay | Admitting: Family Medicine

## 2021-09-21 NOTE — Telephone Encounter (Signed)
Sent patient MyChart message to see if he was using medication as he said it was no longer working in [employer].

## 2021-09-26 ENCOUNTER — Other Ambulatory Visit: Payer: Self-pay

## 2021-09-26 MED ORDER — DULOXETINE HCL 20 MG PO CPEP: 20.0000 mg | ORAL_CAPSULE | Freq: Every day | ORAL | 0 refills | Status: DC

## 2021-09-26 MED ORDER — GABAPENTIN 100 MG PO CAPS: 200.0000 mg | ORAL_CAPSULE | Freq: Two times a day (BID) | ORAL | 1 refills | Status: DC

## 2021-10-02 NOTE — Progress Notes (Unsigned)
[place] Phone: [phone] Subjective:   Ricky [name], am serving as a scribe for Dr. Hulan Saas.   I'm seeing this patient by the request  of:  Ricky Olp, MD  CC: back pain follow up   [id number]  08/30/2021 Significant degenerative disc disease of the lumbar spine.  Patient did not respond as well to the epidural as we anticipated this time.  Is having pain that is affecting daily activities.  Continuing to have some left knee pain but very minimal compared to what it was previously.  Patient does state that the pain does affect daily activities and sometimes very difficult to start walking after sleeping or sitting for long amount of time.  Has noticed more discomfort and more of a flexed state when walking the dog.  We discussed different treatment options.  Patient's wife is undergoing chemotherapy so we will hold on any type of surgical intervention but would consider the possibility of Cymbalta.  Started him on 20 mg.  Warned of potential side effects.  Patient will follow-up with me again in 4 to 6 weeks and see if we need to increase to 30 mg.  Updated 10/03/2021 Ricky [name]. is a 76 y.o. male coming in with complaint of back pain. Some [name] his pain is more intense than last visit and he feels like he is having more bad than good [name]. Likes to walk his dog but is having a hard time going around the block without pain. [name] noticeable difference with Cymbalta. Patient is discouraged.        Past Medical History:  Diagnosis Date   Arthritis    Carotid artery occlusion    Complication of anesthesia    slow to wake up after shoulder surgeries   Hyperlipidemia 03/08/2016   LDL 75 pre stroke. Goal <70 so on atorvastatin '20mg'$  every other day   Hypertension    Peripheral vascular disease ([place])    Pneumonia    Stroke [facility])    still has decreased sensation in left hand and arm (as of  03/09/16)   Past Surgical History:  Procedure Laterality Date   CAROTID ENDARTERECTOMY     ENDARTERECTOMY Right 03/19/2016   Procedure: ENDARTERECTOMY CAROTID;  Surgeon: Elam Dutch, MD;  Location: [place];  Service: Vascular;  Laterality: Right;   PATCH ANGIOPLASTY Right 03/19/2016   Procedure: PATCH ANGIOPLASTY OF RIGHT CAROTID USING HEMASHIELD PLATINUM FINESSE;  Surgeon: Elam Dutch, MD;  Location: [place];  Service: Vascular;  Laterality: Right;   SHOULDER SURGERY Bilateral    left- injections intermittent now  Rotator cuff repair on both shoulders, 2 different surgeries   TONSILLECTOMY     TOTAL KNEE ARTHROPLASTY Left 12/14/2013   Procedure: LEFT TOTAL KNEE ARTHROPLASTY;  Surgeon: Gearlean Alf, MD;  [facility];  Service: Orthopedics;  Laterality: Left;   Social History   Socioeconomic History   Marital status: Married    Spouse name: Not on file   Number of children: Not on file   Years of education: Not on file   Highest education level: Not on file  Occupational History   Occupation: Retired  Tobacco Use   Smoking status: Never   Smokeless tobacco: Never  Substance and Sexual Activity   Alcohol use: [name]    Comment: former drinker   Drug use: [name]   Sexual activity: Yes  Other Topics Concern   Not on file  Social History Narrative   Married. 1 son. 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       Hobbies: gym daily- riding exercise bike plus weights/machines, photography- used to do a lot of black and white, gardening   Social Determinants of Health   Financial Resource Strain: Low Risk  (04/21/2021)   Overall Financial Resource Strain (CARDIA)    Difficulty of Paying Living Expenses: Not hard at all  Food Insecurity: [name] Food Insecurity (04/21/2021)   Hunger Vital Sign    Worried About Running Out of Food in the Last Year: Never true    Ran Out of Food in the Last Year: Never true  Transportation  Needs: [name] Transportation Needs (04/21/2021)   PRAPARE - Hydrologist (Medical): [name]    Lack of Transportation (Non-Medical): [name]  Physical Activity: Sufficiently Active (04/21/2021)   Exercise Vital Sign    [name] of Exercise per Week: 3 [name]    Minutes of Exercise per Session: 90 min  Stress: [name] Stress Concern Present (04/21/2021)   [place]    Feeling of Stress : Not at all  Social Connections: Moderately Isolated (04/21/2021)   Social Connection and Isolation Panel [NHANES]    Frequency of Communication with Friends and Family: Twice a week    Frequency of Social Gatherings with Friends and Family: More than three times a week    Attends Religious Services: Never    Marine scientist or Organizations: [name]    Attends Music therapist: Never    Marital Status: Married   Allergies  Allergen Reactions   Amoxicillin Rash    Has patient had a PCN reaction causing immediate rash, facial/tongue/throat swelling, SOB or lightheadedness with hypotension:unsure Has patient had a PCN reaction causing severe rash involving mucus membranes or skin necrosis:unsure Has patient had a PCN reaction that required hospitalization [name] Has patient had a PCN reaction occurring within the last 10 years: [name] If all of the above answers are "[name]", then may proceed with Cephalosporin use.    Family History  Problem Relation Age of Onset   Heart disease Father        in 89s- specifics unclear   Other Mother        passed in late 54s     Current Outpatient Medications (Cardiovascular):    amLODipine (NORVASC) 2.5 MG tablet, Take 1 tablet (2.5 mg total) by mouth daily.   amLODipine (NORVASC) 5 MG tablet, Take 1 tablet (5 mg total) by mouth daily.   atorvastatin (LIPITOR) 20 MG tablet, TAKE 1 TABLET BY MOUTH EVERY OTHER DAY  Current Outpatient Medications (Respiratory):    albuterol (VENTOLIN HFA) 108  (90 Base) MCG/ACT inhaler, Inhale 2 puffs into the lungs every 6 (six) hours as needed for wheezing or shortness of breath.  Current Outpatient Medications (Analgesics):    acetaminophen (TYLENOL) 500 MG tablet, Take 500-1,000 mg by mouth every 6 (six) hours as needed (for pain.).    aspirin EC 325 MG tablet, Take 1 tablet (325 mg total) by mouth daily.   Current Outpatient Medications (Other):    gabapentin (NEURONTIN) 100 MG capsule, Take 2 capsules (200 mg total) by mouth 2 (two) times daily.   Multiple Vitamin (MULTIVITAMIN WITH MINERALS) TABS tablet, Take 1 tablet by mouth daily.   DULoxetine (CYMBALTA) 20 MG capsule, Take 2 capsules (40 mg total)  by mouth daily.    Review of Systems:  [name] headache, visual changes, nausea, vomiting, diarrhea, constipation, dizziness, abdominal pain, skin rash, fevers, chills, night sweats, weight loss, swollen lymph nodes, body aches, joint swelling, chest pain, shortness of breath, mood changes. POSITIVE muscle aches  Objective  Blood pressure 128/84, pulse 61, height '5\' 5"'$  (1.651 m), weight 138 lb (62.6 kg), SpO2 94 %.   General: [name] apparent distress alert and oriented x3 mood and affect normal, dressed appropriately.  HEENT: Pupils equal, extraocular movements intact  Respiratory: Patient's speak in full sentences and does not appear short of breath  Cardiovascular: [name] lower extremity edema, non tender, [name] erythema  Severe arthritic changes of multiple joints.  Patient is tender to palpation over the paraspinal musculature of the lumbar spine.  Patient does have some atrophy of the gluteal area and the thigh musculature.  4 out of 5 strength in lower extremities.    Impression and Recommendations:    The above documentation has been reviewed and is accurate and complete Ricky Pulley, DO

## 2021-10-03 ENCOUNTER — Encounter: Payer: Self-pay | Admitting: Family Medicine

## 2021-10-03 ENCOUNTER — Ambulatory Visit (INDEPENDENT_AMBULATORY_CARE_PROVIDER_SITE_OTHER): Payer: Medicare Other | Admitting: Family Medicine

## 2021-10-03 VITALS — BP 128/84 | HR 61 | Ht 65.0 in | Wt 138.0 lb

## 2021-10-03 DIAGNOSIS — I6521 Occlusion and stenosis of right carotid artery: Secondary | ICD-10-CM

## 2021-10-03 DIAGNOSIS — M5136 Other intervertebral disc degeneration, lumbar region: Secondary | ICD-10-CM | POA: Diagnosis not present

## 2021-10-03 MED ORDER — DULOXETINE HCL 20 MG PO CPEP: 40.0000 mg | ORAL_CAPSULE | Freq: Every day | ORAL | 0 refills | Status: DC

## 2021-10-03 NOTE — Assessment & Plan Note (Addendum)
At L4-L5, there is 7 mm grade 1 anterolisthesis.   Advanced facet arthrosis with ligamentum flavum hypertrophy. Severe right subarticular and central canal stenosis.Marland Kitchen 6. Disc degeneration is greatest at L1-L2 (moderate to moderately advanced), L2-L3 (advanced), L3-L4 (moderate to moderately advanced)and L4-L5 (advanced). Mild degenerative endplate edema at A5-W9,V9-Y8 and L4-L5. Unfortunately patient has failed all other conservative therapy at this time.  We discussed the severity of the patient's back.  We will increase his Cymbalta to 40 mg but I do think that he should have further evaluation with surgery at this moment.  Patient seems to have some mild increase in weakness of the lower extremities as well.  Patient understands this.  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  Total time going over the MRI and discussing with patient 41 minutes

## 2021-10-03 NOTE — Patient Instructions (Addendum)
Good to see you Referral [facility] Dr. Reatha Armour Increase Cymbalta to 2 pills in AM We are here if you need [place]

## 2021-10-16 ENCOUNTER — Other Ambulatory Visit: Payer: Self-pay | Admitting: Family Medicine

## 2021-10-17 ENCOUNTER — Encounter: Payer: Self-pay | Admitting: Family Medicine

## 2021-10-17 ENCOUNTER — Other Ambulatory Visit: Payer: Self-pay

## 2021-10-17 MED ORDER — DULOXETINE HCL 20 MG PO CPEP: 40.0000 mg | ORAL_CAPSULE | Freq: Every day | ORAL | 0 refills | Status: DC

## 2021-10-30 ENCOUNTER — Encounter: Payer: Self-pay | Admitting: *Deleted

## 2021-11-09 ENCOUNTER — Encounter: Payer: Self-pay | Admitting: Family Medicine

## 2021-11-10 ENCOUNTER — Encounter: Payer: Self-pay | Admitting: Family Medicine

## 2021-11-10 DIAGNOSIS — Z23 Encounter for immunization: Secondary | ICD-10-CM | POA: Diagnosis not present

## 2021-11-22 DIAGNOSIS — M4316 Spondylolisthesis, lumbar region: Secondary | ICD-10-CM | POA: Diagnosis not present

## 2021-11-26 ENCOUNTER — Encounter: Payer: Self-pay | Admitting: Family Medicine

## 2021-11-27 ENCOUNTER — Other Ambulatory Visit: Payer: Self-pay

## 2021-11-27 MED ORDER — GABAPENTIN 100 MG PO CAPS: 200.0000 mg | ORAL_CAPSULE | Freq: Two times a day (BID) | ORAL | 1 refills | Status: DC

## 2021-11-28 DIAGNOSIS — M47816 Spondylosis without myelopathy or radiculopathy, lumbar region: Secondary | ICD-10-CM | POA: Diagnosis not present

## 2021-11-28 DIAGNOSIS — M48062 Spinal stenosis, lumbar region with neurogenic claudication: Secondary | ICD-10-CM | POA: Diagnosis not present

## 2021-11-30 DIAGNOSIS — M6258 Muscle wasting and atrophy, not elsewhere classified, other site: Secondary | ICD-10-CM | POA: Diagnosis not present

## 2021-11-30 DIAGNOSIS — M545 Low back pain, unspecified: Secondary | ICD-10-CM | POA: Diagnosis not present

## 2021-11-30 DIAGNOSIS — M2569 Stiffness of other specified joint, not elsewhere classified: Secondary | ICD-10-CM | POA: Diagnosis not present

## 2021-12-01 ENCOUNTER — Encounter: Payer: Self-pay | Admitting: Family Medicine

## 2021-12-03 ENCOUNTER — Other Ambulatory Visit: Payer: Self-pay | Admitting: Family Medicine

## 2021-12-04 DIAGNOSIS — M6258 Muscle wasting and atrophy, not elsewhere classified, other site: Secondary | ICD-10-CM | POA: Diagnosis not present

## 2021-12-04 DIAGNOSIS — M545 Low back pain, unspecified: Secondary | ICD-10-CM | POA: Diagnosis not present

## 2021-12-04 DIAGNOSIS — M2569 Stiffness of other specified joint, not elsewhere classified: Secondary | ICD-10-CM | POA: Diagnosis not present

## 2021-12-06 DIAGNOSIS — L570 Actinic keratosis: Secondary | ICD-10-CM | POA: Diagnosis not present

## 2021-12-06 DIAGNOSIS — B353 Tinea pedis: Secondary | ICD-10-CM | POA: Diagnosis not present

## 2021-12-06 DIAGNOSIS — L814 Other melanin hyperpigmentation: Secondary | ICD-10-CM | POA: Diagnosis not present

## 2021-12-06 DIAGNOSIS — D225 Melanocytic nevi of trunk: Secondary | ICD-10-CM | POA: Diagnosis not present

## 2021-12-06 DIAGNOSIS — L821 Other seborrheic keratosis: Secondary | ICD-10-CM | POA: Diagnosis not present

## 2021-12-07 DIAGNOSIS — M2569 Stiffness of other specified joint, not elsewhere classified: Secondary | ICD-10-CM | POA: Diagnosis not present

## 2021-12-07 DIAGNOSIS — M545 Low back pain, unspecified: Secondary | ICD-10-CM | POA: Diagnosis not present

## 2021-12-07 DIAGNOSIS — M6258 Muscle wasting and atrophy, not elsewhere classified, other site: Secondary | ICD-10-CM | POA: Diagnosis not present

## 2021-12-12 DIAGNOSIS — M6258 Muscle wasting and atrophy, not elsewhere classified, other site: Secondary | ICD-10-CM | POA: Diagnosis not present

## 2021-12-12 DIAGNOSIS — M545 Low back pain, unspecified: Secondary | ICD-10-CM | POA: Diagnosis not present

## 2021-12-12 DIAGNOSIS — M2569 Stiffness of other specified joint, not elsewhere classified: Secondary | ICD-10-CM | POA: Diagnosis not present

## 2021-12-14 DIAGNOSIS — M2569 Stiffness of other specified joint, not elsewhere classified: Secondary | ICD-10-CM | POA: Diagnosis not present

## 2021-12-14 DIAGNOSIS — M545 Low back pain, unspecified: Secondary | ICD-10-CM | POA: Diagnosis not present

## 2021-12-14 DIAGNOSIS — M6258 Muscle wasting and atrophy, not elsewhere classified, other site: Secondary | ICD-10-CM | POA: Diagnosis not present

## 2021-12-19 DIAGNOSIS — M2569 Stiffness of other specified joint, not elsewhere classified: Secondary | ICD-10-CM | POA: Diagnosis not present

## 2021-12-19 DIAGNOSIS — M6258 Muscle wasting and atrophy, not elsewhere classified, other site: Secondary | ICD-10-CM | POA: Diagnosis not present

## 2021-12-19 DIAGNOSIS — M545 Low back pain, unspecified: Secondary | ICD-10-CM | POA: Diagnosis not present

## 2021-12-20 DIAGNOSIS — M4316 Spondylolisthesis, lumbar region: Secondary | ICD-10-CM | POA: Diagnosis not present

## 2021-12-20 DIAGNOSIS — M47816 Spondylosis without myelopathy or radiculopathy, lumbar region: Secondary | ICD-10-CM | POA: Diagnosis not present

## 2021-12-21 DIAGNOSIS — M2569 Stiffness of other specified joint, not elsewhere classified: Secondary | ICD-10-CM | POA: Diagnosis not present

## 2021-12-21 DIAGNOSIS — M6258 Muscle wasting and atrophy, not elsewhere classified, other site: Secondary | ICD-10-CM | POA: Diagnosis not present

## 2021-12-21 DIAGNOSIS — M545 Low back pain, unspecified: Secondary | ICD-10-CM | POA: Diagnosis not present

## 2022-01-02 DIAGNOSIS — M6258 Muscle wasting and atrophy, not elsewhere classified, other site: Secondary | ICD-10-CM | POA: Diagnosis not present

## 2022-01-02 DIAGNOSIS — M545 Low back pain, unspecified: Secondary | ICD-10-CM | POA: Diagnosis not present

## 2022-01-02 DIAGNOSIS — M2569 Stiffness of other specified joint, not elsewhere classified: Secondary | ICD-10-CM | POA: Diagnosis not present

## 2022-01-04 DIAGNOSIS — M545 Low back pain, unspecified: Secondary | ICD-10-CM | POA: Diagnosis not present

## 2022-01-04 DIAGNOSIS — M2569 Stiffness of other specified joint, not elsewhere classified: Secondary | ICD-10-CM | POA: Diagnosis not present

## 2022-01-04 DIAGNOSIS — M6258 Muscle wasting and atrophy, not elsewhere classified, other site: Secondary | ICD-10-CM | POA: Diagnosis not present

## 2022-01-09 DIAGNOSIS — M2569 Stiffness of other specified joint, not elsewhere classified: Secondary | ICD-10-CM | POA: Diagnosis not present

## 2022-01-09 DIAGNOSIS — M6258 Muscle wasting and atrophy, not elsewhere classified, other site: Secondary | ICD-10-CM | POA: Diagnosis not present

## 2022-01-09 DIAGNOSIS — M545 Low back pain, unspecified: Secondary | ICD-10-CM | POA: Diagnosis not present

## 2022-01-10 ENCOUNTER — Other Ambulatory Visit: Payer: Self-pay | Admitting: Family Medicine

## 2022-01-11 DIAGNOSIS — M2569 Stiffness of other specified joint, not elsewhere classified: Secondary | ICD-10-CM | POA: Diagnosis not present

## 2022-01-11 DIAGNOSIS — M6258 Muscle wasting and atrophy, not elsewhere classified, other site: Secondary | ICD-10-CM | POA: Diagnosis not present

## 2022-01-11 DIAGNOSIS — M545 Low back pain, unspecified: Secondary | ICD-10-CM | POA: Diagnosis not present

## 2022-01-12 ENCOUNTER — Other Ambulatory Visit: Payer: Self-pay | Admitting: Family Medicine

## 2022-01-16 DIAGNOSIS — M545 Low back pain, unspecified: Secondary | ICD-10-CM | POA: Diagnosis not present

## 2022-01-16 DIAGNOSIS — M6258 Muscle wasting and atrophy, not elsewhere classified, other site: Secondary | ICD-10-CM | POA: Diagnosis not present

## 2022-01-16 DIAGNOSIS — M2569 Stiffness of other specified joint, not elsewhere classified: Secondary | ICD-10-CM | POA: Diagnosis not present

## 2022-01-18 DIAGNOSIS — M6258 Muscle wasting and atrophy, not elsewhere classified, other site: Secondary | ICD-10-CM | POA: Diagnosis not present

## 2022-01-18 DIAGNOSIS — M2569 Stiffness of other specified joint, not elsewhere classified: Secondary | ICD-10-CM | POA: Diagnosis not present

## 2022-01-18 DIAGNOSIS — M545 Low back pain, unspecified: Secondary | ICD-10-CM | POA: Diagnosis not present

## 2022-01-23 DIAGNOSIS — M6258 Muscle wasting and atrophy, not elsewhere classified, other site: Secondary | ICD-10-CM | POA: Diagnosis not present

## 2022-01-23 DIAGNOSIS — M545 Low back pain, unspecified: Secondary | ICD-10-CM | POA: Diagnosis not present

## 2022-01-23 DIAGNOSIS — M2569 Stiffness of other specified joint, not elsewhere classified: Secondary | ICD-10-CM | POA: Diagnosis not present

## 2022-01-25 DIAGNOSIS — M6258 Muscle wasting and atrophy, not elsewhere classified, other site: Secondary | ICD-10-CM | POA: Diagnosis not present

## 2022-01-25 DIAGNOSIS — M2569 Stiffness of other specified joint, not elsewhere classified: Secondary | ICD-10-CM | POA: Diagnosis not present

## 2022-01-25 DIAGNOSIS — M545 Low back pain, unspecified: Secondary | ICD-10-CM | POA: Diagnosis not present

## 2022-02-01 ENCOUNTER — Other Ambulatory Visit: Payer: Self-pay | Admitting: Family Medicine

## 2022-02-05 HISTORY — PX: CATARACT EXTRACTION W/ INTRAOCULAR LENS IMPLANT: SHX1309

## 2022-02-13 DIAGNOSIS — M6258 Muscle wasting and atrophy, not elsewhere classified, other site: Secondary | ICD-10-CM | POA: Diagnosis not present

## 2022-02-13 DIAGNOSIS — M2569 Stiffness of other specified joint, not elsewhere classified: Secondary | ICD-10-CM | POA: Diagnosis not present

## 2022-02-13 DIAGNOSIS — M545 Low back pain, unspecified: Secondary | ICD-10-CM | POA: Diagnosis not present

## 2022-02-15 DIAGNOSIS — M2569 Stiffness of other specified joint, not elsewhere classified: Secondary | ICD-10-CM | POA: Diagnosis not present

## 2022-02-15 DIAGNOSIS — M545 Low back pain, unspecified: Secondary | ICD-10-CM | POA: Diagnosis not present

## 2022-02-15 DIAGNOSIS — M6258 Muscle wasting and atrophy, not elsewhere classified, other site: Secondary | ICD-10-CM | POA: Diagnosis not present

## 2022-02-20 DIAGNOSIS — M545 Low back pain, unspecified: Secondary | ICD-10-CM | POA: Diagnosis not present

## 2022-02-20 DIAGNOSIS — M2569 Stiffness of other specified joint, not elsewhere classified: Secondary | ICD-10-CM | POA: Diagnosis not present

## 2022-02-20 DIAGNOSIS — M6258 Muscle wasting and atrophy, not elsewhere classified, other site: Secondary | ICD-10-CM | POA: Diagnosis not present

## 2022-02-21 DIAGNOSIS — M4316 Spondylolisthesis, lumbar region: Secondary | ICD-10-CM | POA: Diagnosis not present

## 2022-02-21 DIAGNOSIS — M48062 Spinal stenosis, lumbar region with neurogenic claudication: Secondary | ICD-10-CM | POA: Diagnosis not present

## 2022-02-22 DIAGNOSIS — M6258 Muscle wasting and atrophy, not elsewhere classified, other site: Secondary | ICD-10-CM | POA: Diagnosis not present

## 2022-02-22 DIAGNOSIS — M545 Low back pain, unspecified: Secondary | ICD-10-CM | POA: Diagnosis not present

## 2022-02-22 DIAGNOSIS — M2569 Stiffness of other specified joint, not elsewhere classified: Secondary | ICD-10-CM | POA: Diagnosis not present

## 2022-02-26 ENCOUNTER — Ambulatory Visit (INDEPENDENT_AMBULATORY_CARE_PROVIDER_SITE_OTHER): Payer: Medicare Other | Admitting: Family Medicine

## 2022-02-26 ENCOUNTER — Encounter: Payer: Self-pay | Admitting: Family Medicine

## 2022-02-26 VITALS — BP 115/79 | HR 72 | Temp 97.7°F | Ht 65.0 in | Wt 139.0 lb

## 2022-02-26 DIAGNOSIS — E785 Hyperlipidemia, unspecified: Secondary | ICD-10-CM

## 2022-02-26 DIAGNOSIS — D692 Other nonthrombocytopenic purpura: Secondary | ICD-10-CM

## 2022-02-26 DIAGNOSIS — J439 Emphysema, unspecified: Secondary | ICD-10-CM | POA: Diagnosis not present

## 2022-02-26 DIAGNOSIS — I10 Essential (primary) hypertension: Secondary | ICD-10-CM | POA: Diagnosis not present

## 2022-02-26 DIAGNOSIS — I69359 Hemiplegia and hemiparesis following cerebral infarction affecting unspecified side: Secondary | ICD-10-CM

## 2022-02-26 DIAGNOSIS — R739 Hyperglycemia, unspecified: Secondary | ICD-10-CM

## 2022-02-26 LAB — CBC WITH DIFFERENTIAL/PLATELET
Basophils Absolute: 0 10*3/uL (ref 0.0–0.1)
Basophils Relative: 0.4 % (ref 0.0–3.0)
Eosinophils Absolute: 0.2 10*3/uL (ref 0.0–0.7)
Eosinophils Relative: 3.2 % (ref 0.0–5.0)
HCT: 39.8 % (ref 39.0–52.0)
Hemoglobin: 13.3 g/dL (ref 13.0–17.0)
Lymphocytes Relative: 19.1 % (ref 12.0–46.0)
Lymphs Abs: 1 10*3/uL (ref 0.7–4.0)
MCHC: 33.5 g/dL (ref 30.0–36.0)
MCV: 95.1 fl (ref 78.0–100.0)
Monocytes Absolute: 0.6 10*3/uL (ref 0.1–1.0)
Monocytes Relative: 11 % (ref 3.0–12.0)
Neutro Abs: 3.4 10*3/uL (ref 1.4–7.7)
Neutrophils Relative %: 66.3 % (ref 43.0–77.0)
Platelets: 298 10*3/uL (ref 150.0–400.0)
RBC: 4.19 Mil/uL — ABNORMAL LOW (ref 4.22–5.81)
RDW: 13.7 % (ref 11.5–15.5)
WBC: 5.2 10*3/uL (ref 4.0–10.5)

## 2022-02-26 LAB — COMPREHENSIVE METABOLIC PANEL
ALT: 23 U/L (ref 0–53)
AST: 29 U/L (ref 0–37)
Albumin: 3.8 g/dL (ref 3.5–5.2)
Alkaline Phosphatase: 61 U/L (ref 39–117)
BUN: 25 mg/dL — ABNORMAL HIGH (ref 6–23)
CO2: 28 mEq/L (ref 19–32)
Calcium: 9 mg/dL (ref 8.4–10.5)
Chloride: 105 mEq/L (ref 96–112)
Creatinine, Ser: 0.7 mg/dL (ref 0.40–1.50)
GFR: 88.42 mL/min (ref >60.00)
Glucose, Bld: 103 mg/dL — ABNORMAL HIGH (ref 70–99)
Potassium: 4.2 mEq/L (ref 3.5–5.1)
Sodium: 141 mEq/L (ref 135–145)
Total Bilirubin: 0.6 mg/dL (ref 0.2–1.2)
Total Protein: 6.1 g/dL (ref 6.0–8.3)

## 2022-02-26 LAB — LIPID PANEL
Cholesterol: 115 mg/dL (ref 0–200)
HDL: 57.9 mg/dL (ref >39.00)
LDL Cholesterol: 47 mg/dL (ref 0–99)
NonHDL: 56.75
Total CHOL/HDL Ratio: 2
Triglycerides: 48 mg/dL (ref 0.0–149.0)
VLDL: 9.6 mg/dL (ref 0.0–40.0)

## 2022-02-26 LAB — HEMOGLOBIN A1C: Hgb A1c MFr Bld: 5.9 % (ref 4.6–6.5)

## 2022-02-26 NOTE — Patient Instructions (Addendum)
Please stop by lab before you go If you have mychart- we will send your results within 3 business days of [place] receiving them.  If you do not have mychart- we will call you about results within 5 business days of [place] receiving them.  *please also note that you will see labs on mychart as soon as they post. I will later go in and write notes on them- will say "notes from Dr. Yong Channel"   Could try cock up wrist splint for left hand- if not helpful- schedule follow up with Dr. Charlann Boxer  Recommended follow up: Return in about 6 months (around 08/27/2022) for followup or sooner if needed.Schedule b4 you leave.

## 2022-02-26 NOTE — Progress Notes (Signed)
Phone [phone] In person visit   Subjective:   Ricky [name]. is a 77 y.o. year old very pleasant male patient who presents for/with See problem oriented charting Chief Complaint  Patient presents with   Follow-up   Hyperlipidemia   Hypertension    Past Medical History-  Patient Active Problem List   Diagnosis Date Noted   Hemiparesis affecting dominant side as late effect of stroke ([place]) 03/08/2016    Priority: High   History of CVA (cerebrovascular accident) 02/28/2016    Priority: High   Emphysema lung ([place]) 08/25/2021    Priority: Medium    Hyperglycemia 08/25/2021    Priority: Medium    Carotid artery stenosis, symptomatic, right 03/19/2016    Priority: Medium    Hyperlipidemia 03/08/2016    Priority: Medium    Essential hypertension 12/20/2014    Priority: Medium    Senile purpura ([place]) 08/25/2021    Priority: Low   Degenerative disc disease, lumbar 02/09/2021    Priority: Low   Acquired leg length discrepancy 02/09/2021    Priority: Low   Leukopenia 01/08/2017    Priority: Low   Trigger middle finger of left hand 11/12/2016    Priority: Low   OA (osteoarthritis) of knee 12/14/2013    Priority: Low    Medications- reviewed and updated Current Outpatient Medications  Medication Sig Dispense Refill   acetaminophen (TYLENOL) 500 MG tablet Take 500-1,000 mg by mouth every 6 (six) hours as needed (for pain.).      albuterol (VENTOLIN HFA) 108 (90 Base) MCG/ACT inhaler Inhale 2 puffs into the lungs every 6 (six) hours as needed for wheezing or shortness of breath. 1 each 0   amLODipine (NORVASC) 2.5 MG tablet TAKE 1 TABLET BY MOUTH EVERY DAY 90 tablet 1   amLODipine (NORVASC) 5 MG tablet Take 1 tablet (5 mg total) by mouth daily. 90 tablet 3   aspirin EC 81 MG tablet Take 81 mg by mouth daily. Swallow whole.     atorvastatin (LIPITOR) 20 MG tablet TAKE 1 TABLET BY MOUTH EVERY OTHER DAY 45 tablet 2   gabapentin (NEURONTIN) 100 MG capsule TAKE 2 CAPSULES  BY MOUTH 2 TIMES DAILY. 120 capsule 1   Multiple Vitamin (MULTIVITAMIN WITH MINERALS) TABS tablet Take 1 tablet by mouth daily.     No current facility-administered medications for this visit.     Objective:  BP 115/79 Comment: home reading this morning  Pulse 72   Temp 97.7 F (36.5 C)   Ht '5\' 5"'$  (1.651 m)   Wt 139 lb (63 kg)   SpO2 98%   BMI 23.13 kg/m  Gen: NAD, resting comfortably CV: RRR no murmurs rubs or gallops Lungs: CTAB no crackles, wheeze, rhonchi Abdomen: soft/nontender/nondistended/normal bowel sounds. No rebound or guarding.  Ext: trace edema Skin: warm, dry Neuro: mild grip strength loss on the left Msk: slightly positive Tinel, phalen normal    Assessment and Plan   # Degenerative disc disease-working with Dr. Tamala Julian with last visit 10/03/2021-Cymbalta was increased to 40 mg and he was referred to neurosurgery Dr. Reatha Armour - he came off cymbalta and is on gabapentin '200mg'$  twice a day. Doing 300 in AM and 200 at night. Also takes some aleve (want to minimize as best as possible) With imaging results as follows "At L4-L5, there is 7 mm grade 1 anterolisthesis.   Advanced facet arthrosis with ligamentum flavum hypertrophy. Severe right subarticular and central canal stenosis.Marland Kitchen 6. Disc degeneration is greatest at L1-L2 (moderate to  moderately advanced), L2-L3 (advanced), L3-L4 (moderate to moderately advanced)and L4-L5 (advanced). Mild degenerative endplate edema at W3-S9,H7-D4 and L4-L5." -one leg shorter than the other and may contribute -working hard with PT  -alsow workign with Dr. Davy Pique -has had 2 injecitons and 1 block with minimal benefit -trying all options before surgery -there was a mention of chiropractor with equilibrium- Dr. Joni Fears -also mentioned acupuncture or dry needling- gave option:  -[email or web address]  #hypertension S: medication: Amlodipine 7.5 mg Home readings #s: has home cuff - sometimes #s high but  trends down on repeat with rest. Overall #s probably 120s- as low as 101 and as high as 160 but trends back   Exercise-at least 2-3  days a week typically plus PT can be strenuous for his back BP Readings from Last 3 Encounters:  02/26/22 115/79  10/03/21 128/84  08/30/21 138/84   A/P: stable- continue current medicines    # history of CVA with hemiparesis affecting dominant side (left) as a result  # carotid stenosis-  carotid stenosis of right - stable 12/19/20 #hyperlipidemia - LDL goal below 70 S: Medication:Atorvastatin 20 mg every other day, aspirin 81 mg   - occasionally drops objects with left hand (left handed)  still- occasionally has some left palm hand particularly at night- gets up and walks around and goes away- could be related to neck Lab Results  Component Value Date   CHOL 112 02/17/2021   HDL 63.40 02/17/2021   LDLCALC 40 02/17/2021   LDLDIRECT 44.0 08/03/2019   TRIG 41.0 02/17/2021   CHOLHDL 2 02/17/2021   A/P: history of CVA without recurrence Hemiparesis stable- continue meds for stroke prevention with statin and aspirin Lipids at goal last check labs- continue current medications  Could try cock up wrist splint for left hand- if not helpful- schedule follow up with Dr. Charlann Boxer -he plans to   # Hyperglycemia/insulin resistance/prediabetes- peak a1c 103 08/03/19.  Peak A1c 5.9 February 17, 2020 S:  Medication: none  Exercise and diet- working hard to get in gym   Lab Results  Component Value Date   HGBA1C 5.9 08/25/2021   HGBA1C 5.9 02/17/2021   HGBA1C 5.7 08/16/2020  A/P: hopefully stable or improved- update a1c today. Continue current meds for now  - feels could up some between ice cream and holiday chocolates  #Mild leukopenia-other cell lines were normal until last visit when he had slight anemia as well as slightly low platelets.Plan at last visit was CBC with differential as well as pathology smear review - largely reassuring then reverted to  primarily leukopenia -looked fine on last check but will check today Lab Results  Component Value Date   WBC 4.1 08/25/2021   HGB 13.2 08/25/2021   HCT 39.3 08/25/2021   MCV 97.2 08/25/2021   PLT 214.0 08/25/2021   # Left knee pain-  Followed with Dr. Elmyra Ricks in the past. intermittent issues- seeing Dr. Tamala Julian now for this and back. Missed a step when light was out and had pain and then got pulled by dog once- both bothersome. After this occurred saw Dr. Tamala Julian- possible loose body in the knee- and they discussed MRI if needed. Pain up and down- encouraged follow up with Dr. Louis Matte- incidental finding on imaging .  -has albuterol available if needed but not needing  #BPH/nocturia- ongoing nocturia. Urine flow not as good as it was years ago and frequency up. Wants to hold off for now on flomax type option  #senile purpura- noted  but not too bad.  Stable. Check cbc at least annually   Recommended follow up: Return in about 6 months (around 08/27/2022) for followup or sooner if needed.Schedule b4 you leave. Future Appointments  Date Time Provider [place]  05/04/2022 10:30 AM [facility]   Lab/Order associations: coffee with half and half   ICD-10-CM   1. Essential hypertension  I10     2. Hyperlipidemia, unspecified hyperlipidemia type  E78.5 CBC with Differential/Platelet    Comprehensive metabolic panel    Lipid panel    3. Hemiparesis affecting dominant side as late effect of stroke (HCC) Chronic I69.359     4. Pulmonary emphysema, unspecified emphysema type (HCC) Chronic J43.9     5. Senile purpura (HCC) Chronic D69.2     6. Hyperglycemia  R73.9 HgB A1c      No orders of the defined types were placed in this encounter.   Return precautions advised.  Garret Reddish, MD

## 2022-02-27 ENCOUNTER — Other Ambulatory Visit: Payer: Self-pay | Admitting: *Deleted

## 2022-02-27 DIAGNOSIS — M6258 Muscle wasting and atrophy, not elsewhere classified, other site: Secondary | ICD-10-CM | POA: Diagnosis not present

## 2022-02-27 DIAGNOSIS — M2569 Stiffness of other specified joint, not elsewhere classified: Secondary | ICD-10-CM | POA: Diagnosis not present

## 2022-02-27 DIAGNOSIS — I6523 Occlusion and stenosis of bilateral carotid arteries: Secondary | ICD-10-CM

## 2022-02-27 DIAGNOSIS — M545 Low back pain, unspecified: Secondary | ICD-10-CM | POA: Diagnosis not present

## 2022-02-28 DIAGNOSIS — M4316 Spondylolisthesis, lumbar region: Secondary | ICD-10-CM | POA: Diagnosis not present

## 2022-02-28 DIAGNOSIS — M48062 Spinal stenosis, lumbar region with neurogenic claudication: Secondary | ICD-10-CM | POA: Diagnosis not present

## 2022-03-01 DIAGNOSIS — M545 Low back pain, unspecified: Secondary | ICD-10-CM | POA: Diagnosis not present

## 2022-03-01 DIAGNOSIS — M6258 Muscle wasting and atrophy, not elsewhere classified, other site: Secondary | ICD-10-CM | POA: Diagnosis not present

## 2022-03-01 DIAGNOSIS — M2569 Stiffness of other specified joint, not elsewhere classified: Secondary | ICD-10-CM | POA: Diagnosis not present

## 2022-03-02 NOTE — Progress Notes (Signed)
HISTORY AND PHYSICAL     CC:  follow up. Requesting Provider:  Marin Olp, MD  HPI: This is a 77 y.o. male here for follow up for carotid artery stenosis.  Pt is s/p right CEA for symptomatic (left arm/hand clumsiness) carotid artery stenosis on 03/19/2016 by Dr. Oneida Alar.    Pt was last seen 12/09/2020 and at that time he was doing well without neurological sx. He was not having any claudication sx.   Pt returns today for follow up.    Pt denies any amaurosis fugax, speech difficulties, weakness, numbness, paralysis or clumsiness or facial droop.    He states that he used to have what sounds like a trigger finger on the left middle finger.  He says he slept in a brace and this resolved.  He now describes some pain in the arch of his hand at night depending on how he sleeps.    The pt is on a statin for cholesterol management.  The pt is on a daily aspirin.   Other AC:  none The pt is on CCB for hypertension.   The pt does not have diabetes Tobacco hx:  never  Pt does not have family hx of AAA.  Past Medical History:  Diagnosis Date   Arthritis    Carotid artery occlusion    Complication of anesthesia    slow to wake up after shoulder surgeries   Hyperlipidemia 03/08/2016   LDL 75 pre stroke. Goal <70 so on atorvastatin '20mg'$  every other day   Hypertension    Peripheral vascular disease ([place])    Pneumonia    Stroke [facility])    still has decreased sensation in left hand and arm (as of 03/09/16)    Past Surgical History:  Procedure Laterality Date   CAROTID ENDARTERECTOMY     ENDARTERECTOMY Right 03/19/2016   Procedure: ENDARTERECTOMY CAROTID;  Surgeon: Elam Dutch, MD;  Location: [place];  Service: Vascular;  Laterality: Right;   PATCH ANGIOPLASTY Right 03/19/2016   Procedure: PATCH ANGIOPLASTY OF RIGHT CAROTID USING HEMASHIELD PLATINUM FINESSE;  Surgeon: Elam Dutch, MD;  Location: [place];  Service: Vascular;  Laterality: Right;   SHOULDER SURGERY Bilateral     left- injections intermittent now  Rotator cuff repair on both shoulders, 2 different surgeries   TONSILLECTOMY     TOTAL KNEE ARTHROPLASTY Left 12/14/2013   Procedure: LEFT TOTAL KNEE ARTHROPLASTY;  Surgeon: Gearlean Alf, MD;  Location: [facility];  Service: Orthopedics;  Laterality: Left;    Allergies  Allergen Reactions   Amoxicillin Rash    Has patient had a PCN reaction causing immediate rash, facial/tongue/throat swelling, SOB or lightheadedness with hypotension:unsure Has patient had a PCN reaction causing severe rash involving mucus membranes or skin necrosis:unsure Has patient had a PCN reaction that required hospitalization No Has patient had a PCN reaction occurring within the last 10 years: No If all of the above answers are "NO", then may proceed with Cephalosporin use.     Current Outpatient Medications  Medication Sig Dispense Refill   acetaminophen (TYLENOL) 500 MG tablet Take 500-1,000 mg by mouth every 6 (six) hours as needed (for pain.).      albuterol (VENTOLIN HFA) 108 (90 Base) MCG/ACT inhaler Inhale 2 puffs into the lungs every 6 (six) hours as needed for wheezing or shortness of breath. 1 each 0   amLODipine (NORVASC) 2.5 MG tablet TAKE 1 TABLET BY MOUTH EVERY DAY 90 tablet 1   amLODipine (NORVASC) 5 MG  tablet Take 1 tablet (5 mg total) by mouth daily. 90 tablet 3   aspirin EC 81 MG tablet Take 81 mg by mouth daily. Swallow whole.     atorvastatin (LIPITOR) 20 MG tablet TAKE 1 TABLET BY MOUTH EVERY OTHER DAY 45 tablet 2   gabapentin (NEURONTIN) 100 MG capsule TAKE 2 CAPSULES BY MOUTH 2 TIMES DAILY. 120 capsule 1   Multiple Vitamin (MULTIVITAMIN WITH MINERALS) TABS tablet Take 1 tablet by mouth daily.     No current facility-administered medications for this visit.    Family History  Problem Relation Age of Onset   Heart disease Father        in 51s- specifics unclear   Other Mother        passed in late 11s    Social History   Socioeconomic History    Marital status: Married    Spouse name: Not on file   Number of children: Not on file   Years of education: Not on file   Highest education level: Not on file  Occupational History   Occupation: Retired  Tobacco Use   Smoking status: Never   Smokeless tobacco: Never  Substance and Sexual Activity   Alcohol use: No    Comment: former drinker   Drug use: No   Sexual activity: Yes  Other Topics Concern   Not on file  Social History Narrative   Married. 1 son. 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       Hobbies: gym daily- riding exercise bike plus weights/machines, photography- used to do a lot of black and white, gardening   Social Determinants of Health   Financial Resource Strain: Low Risk  (04/21/2021)   Overall Financial Resource Strain (CARDIA)    Difficulty of Paying Living Expenses: Not hard at all  Food Insecurity: No Food Insecurity (04/21/2021)   Hunger Vital Sign    Worried About Running Out of Food in the Last Year: Never true    Ran Out of Food in the Last Year: Never true  Transportation Needs: No Transportation Needs (04/21/2021)   PRAPARE - Hydrologist (Medical): No    Lack of Transportation (Non-Medical): No  Physical Activity: Sufficiently Active (04/21/2021)   Exercise Vital Sign    Days of Exercise per Week: 3 days    Minutes of Exercise per Session: 90 min  Stress: No Stress Concern Present (04/21/2021)   [place]    Feeling of Stress : Not at all  Social Connections: Moderately Isolated (04/21/2021)   Social Connection and Isolation Panel [NHANES]    Frequency of Communication with Friends and Family: Twice a week    Frequency of Social Gatherings with Friends and Family: More than three times a week    Attends Religious Services: Never    Marine scientist or Organizations: No    Attends  Archivist Meetings: Never    Marital Status: Married  Human resources officer Violence: Not At Risk (04/21/2021)   Humiliation, Afraid, Rape, and Kick questionnaire    Fear of Current or Ex-Partner: No    Emotionally Abused: No    Physically Abused: No    Sexually Abused: No     REVIEW OF SYSTEMS:   '[X]'$  denotes positive finding, '[ ]'$  denotes negative finding Cardiac  Comments:  Chest pain or chest pressure:    Shortness  of breath upon exertion:    Short of breath when lying flat:    Irregular heart rhythm:        Vascular    Pain in calf, thigh, or hip brought on by ambulation:    Pain in feet at night that wakes you up from your sleep:     Blood clot in your veins:    Leg swelling:         Pulmonary    Oxygen at home:    Productive cough:     Wheezing:         Neurologic    Sudden weakness in arms or legs:     Sudden numbness in arms or legs:     Sudden onset of difficulty speaking or slurred speech:    Temporary loss of vision in one eye:     Problems with dizziness:         Gastrointestinal    Blood in stool:     Vomited blood:         Genitourinary    Burning when urinating:     Blood in urine:        Psychiatric    Major depression:         Hematologic    Bleeding problems:    Problems with blood clotting too easily:        Skin    Rashes or ulcers:        Constitutional    Fever or chills:      PHYSICAL EXAMINATION:  Today's Vitals   03/05/22 0815 03/05/22 0817  BP: (!) 160/81 (!) 157/76  Pulse: (!) 52   Resp: 20   Temp: 98.2 F (36.8 C)   TempSrc: Temporal   SpO2: 97%   Weight: 141 lb 9.6 oz (64.2 kg)   Height: '5\' 5"'$  (1.651 m)    Body mass index is 23.56 kg/m.   General:  WDWN in NAD; vital signs documented above Gait: Not observed HENT: WNL, normocephalic Pulmonary: normal non-labored breathing Cardiac: regular HR, without carotid bruits Abdomen: soft, NT; aortic pulse is not palpable Skin: without rashes Vascular  Exam/Pulses:  Right Left  Radial 2+ (normal) 2+ (normal)  Popliteal Unable to palpate Unable to palpate  AT 2+ (normal) 2+ (normal)   Extremities: without open wounds Musculoskeletal: no muscle wasting or atrophy  Neurologic: A&O X 3; moving all extremities equally; speech is fluent/normal Psychiatric:  The pt has Normal affect.   Non-Invasive Vascular Imaging:   Carotid Duplex on 03/05/2022 Right:  1-39% ICA stenosis Left:  1-39% ICA stenosis Vertebrals:  Bilateral vertebral arteries demonstrate antegrade flow.  Subclavians: Normal flow hemodynamics were seen in bilateral subclavian arteries   Previous Carotid duplex on 12/19/2020: Right: 1-39% ICA stenosis Left:   1-39% ICA stenosis    ASSESSMENT/PLAN:: 77 y.o. male here for follow up carotid artery stenosis and s/p right CEA for symptomatic (left arm/hand clumsiness) carotid artery stenosis on 03/19/2016 by Dr. Oneida Alar.    -duplex today reveals 1-39% bilateral ICA stenosis.  Pt doing well and remains asymptomatic.   -discussed s/s of stroke with pt and he understands should he develop any of these sx, he will go to the nearest ER or call 911. -pt will f/u in one year with carotid duplex -pt will call sooner should he have any issues. -continue statin/asa   Leontine Locket, [facility] [phone]  Clinic MD:  Trula Slade

## 2022-03-05 ENCOUNTER — Encounter: Payer: Self-pay | Admitting: Physician Assistant

## 2022-03-05 ENCOUNTER — Ambulatory Visit (HOSPITAL_COMMUNITY)
Admission: RE | Admit: 2022-03-05 | Discharge: 2022-03-05 | Disposition: A | Discharge status: Home / Self Care | Payer: Medicare Other | Source: Ambulatory Visit | Attending: Surgery | Admitting: Surgery

## 2022-03-05 ENCOUNTER — Ambulatory Visit (INDEPENDENT_AMBULATORY_CARE_PROVIDER_SITE_OTHER): Payer: Medicare Other | Admitting: Physician Assistant

## 2022-03-05 VITALS — BP 157/76 | HR 52 | Temp 98.2°F | Resp 20 | Ht 65.0 in | Wt 141.6 lb

## 2022-03-05 DIAGNOSIS — I6523 Occlusion and stenosis of bilateral carotid arteries: Secondary | ICD-10-CM | POA: Insufficient documentation

## 2022-03-06 DIAGNOSIS — M6258 Muscle wasting and atrophy, not elsewhere classified, other site: Secondary | ICD-10-CM | POA: Diagnosis not present

## 2022-03-06 DIAGNOSIS — M2569 Stiffness of other specified joint, not elsewhere classified: Secondary | ICD-10-CM | POA: Diagnosis not present

## 2022-03-06 DIAGNOSIS — M545 Low back pain, unspecified: Secondary | ICD-10-CM | POA: Diagnosis not present

## 2022-03-08 ENCOUNTER — Encounter: Payer: Self-pay | Admitting: Family Medicine

## 2022-03-09 ENCOUNTER — Telehealth (INDEPENDENT_AMBULATORY_CARE_PROVIDER_SITE_OTHER): Payer: Medicare Other | Admitting: Physician Assistant

## 2022-03-09 ENCOUNTER — Encounter: Payer: Self-pay | Admitting: Physician Assistant

## 2022-03-09 ENCOUNTER — Encounter: Payer: Self-pay | Admitting: Family Medicine

## 2022-03-09 DIAGNOSIS — M62838 Other muscle spasm: Secondary | ICD-10-CM

## 2022-03-09 NOTE — Progress Notes (Signed)
   Virtual Visit via Video Note  I connected with  Ricky [name].  on 03/09/22 at 11:30 AM EST by a video enabled telemedicine application and verified that I am speaking with the correct person using two identifiers.  Location: Patient: home Provider: Therapist, music at [place] present: Patient and myself   I discussed the limitations of evaluation and management by telemedicine and the availability of in person appointments. The patient expressed understanding and agreed to proceed.   History of Present Illness:  77 year old male presents for virtual visit today.  States that 2 days ago he started to feel soreness in his left shoulder, which felt like he pulled a muscle with inflammation.  He then started to have pain yesterday in his right shoulder as well.  This morning, woke up and felt the same inflammatory type muscle spasm pain in both of his shoulders and his neck.  He reached out to get a virtual visit.  He has since taken 2 extra strength Tylenol, and he feels like his pain is much better.  States that this morning he was unable to raise his shoulders or move his neck either way without severe pain.  He is now able to move in all directions, but still some spasm and soreness present.  Monday - heavy gym routine Tuesday - Physical therapy appointment - for his lower back  Also reports  - fever 101 yesterday, no fever today. Slight cough. No sore throat. No chest pain or SOB. No headache. No nausea or vomiting. Denies any weakness or numbness in extremities. No headaches or confusion. No gait changes. No dizziness or faint-feelings.   Home COVID-19 test was negative.   Regularly takes gabapentin 300 mg in the morning and 300 mg in the evening.   Observations/Objective:   Gen: Awake, alert, no acute distress Resp: Breathing is even and non-labored MSK: Able to raise arms equally above shoulders; some limited movement noted in neck to the right and  left equally, but normal flexion and extension  Psych: calm/pleasant demeanor Neuro: Alert and Oriented x 3, + facial symmetry, speech is clear.   Assessment and Plan:  1. Muscle spasms of neck No red flags noted; appears to be MSK in nature. He is doing better since taking Tylenol ES. I advised he could take up to 3000 mg of Acetaminophen total in 24 hours. He will take another dose later today if needed. Will work on gentle stretches. Alternate heat / ice to area. Strict ED precautions discussed. Pt to MyChart with update later.    Follow Up Instructions:    I discussed the assessment and treatment plan with the patient. The patient was provided an opportunity to ask questions and all were answered. The patient agreed with the plan and demonstrated an understanding of the instructions.   The patient was advised to call back or seek an in-person evaluation if the symptoms worsen or if the condition fails to improve as anticipated.  Lataisha Colan M Lawernce Earll, PA-C

## 2022-03-12 NOTE — Telephone Encounter (Signed)
Please see pt FYI and advise

## 2022-03-13 DIAGNOSIS — M6258 Muscle wasting and atrophy, not elsewhere classified, other site: Secondary | ICD-10-CM | POA: Diagnosis not present

## 2022-03-13 DIAGNOSIS — M2569 Stiffness of other specified joint, not elsewhere classified: Secondary | ICD-10-CM | POA: Diagnosis not present

## 2022-03-13 DIAGNOSIS — M545 Low back pain, unspecified: Secondary | ICD-10-CM | POA: Diagnosis not present

## 2022-03-23 ENCOUNTER — Encounter: Payer: Self-pay | Admitting: Family Medicine

## 2022-03-26 MED ORDER — GABAPENTIN 100 MG PO CAPS: ORAL_CAPSULE | ORAL | 1 refills | Status: DC

## 2022-03-27 ENCOUNTER — Telehealth: Payer: Self-pay | Admitting: Family Medicine

## 2022-03-27 ENCOUNTER — Other Ambulatory Visit: Payer: Self-pay

## 2022-03-27 DIAGNOSIS — M2569 Stiffness of other specified joint, not elsewhere classified: Secondary | ICD-10-CM | POA: Diagnosis not present

## 2022-03-27 DIAGNOSIS — M6258 Muscle wasting and atrophy, not elsewhere classified, other site: Secondary | ICD-10-CM | POA: Diagnosis not present

## 2022-03-27 DIAGNOSIS — M545 Low back pain, unspecified: Secondary | ICD-10-CM | POA: Diagnosis not present

## 2022-03-27 MED ORDER — GABAPENTIN 300 MG PO CAPS: 300.0000 mg | ORAL_CAPSULE | Freq: Two times a day (BID) | ORAL | 0 refills | Status: DC

## 2022-03-27 NOTE — Telephone Encounter (Signed)
Patient called in regards to this refill. He is currently taking 363m three times a day.  Can the prescription be sent in to reflect the increased dose?

## 2022-03-27 NOTE — Telephone Encounter (Signed)
Error

## 2022-03-29 DIAGNOSIS — M9903 Segmental and somatic dysfunction of lumbar region: Secondary | ICD-10-CM | POA: Diagnosis not present

## 2022-03-29 DIAGNOSIS — M2569 Stiffness of other specified joint, not elsewhere classified: Secondary | ICD-10-CM | POA: Diagnosis not present

## 2022-03-29 DIAGNOSIS — M47817 Spondylosis without myelopathy or radiculopathy, lumbosacral region: Secondary | ICD-10-CM | POA: Diagnosis not present

## 2022-03-29 DIAGNOSIS — M9905 Segmental and somatic dysfunction of pelvic region: Secondary | ICD-10-CM | POA: Diagnosis not present

## 2022-03-29 DIAGNOSIS — M6258 Muscle wasting and atrophy, not elsewhere classified, other site: Secondary | ICD-10-CM | POA: Diagnosis not present

## 2022-03-29 DIAGNOSIS — M9904 Segmental and somatic dysfunction of sacral region: Secondary | ICD-10-CM | POA: Diagnosis not present

## 2022-03-29 DIAGNOSIS — M4316 Spondylolisthesis, lumbar region: Secondary | ICD-10-CM | POA: Diagnosis not present

## 2022-03-29 DIAGNOSIS — M545 Low back pain, unspecified: Secondary | ICD-10-CM | POA: Diagnosis not present

## 2022-03-29 DIAGNOSIS — M1612 Unilateral primary osteoarthritis, left hip: Secondary | ICD-10-CM | POA: Diagnosis not present

## 2022-04-03 DIAGNOSIS — M6258 Muscle wasting and atrophy, not elsewhere classified, other site: Secondary | ICD-10-CM | POA: Diagnosis not present

## 2022-04-03 DIAGNOSIS — M47817 Spondylosis without myelopathy or radiculopathy, lumbosacral region: Secondary | ICD-10-CM | POA: Diagnosis not present

## 2022-04-03 DIAGNOSIS — M9903 Segmental and somatic dysfunction of lumbar region: Secondary | ICD-10-CM | POA: Diagnosis not present

## 2022-04-03 DIAGNOSIS — M9905 Segmental and somatic dysfunction of pelvic region: Secondary | ICD-10-CM | POA: Diagnosis not present

## 2022-04-03 DIAGNOSIS — M9904 Segmental and somatic dysfunction of sacral region: Secondary | ICD-10-CM | POA: Diagnosis not present

## 2022-04-03 DIAGNOSIS — M4316 Spondylolisthesis, lumbar region: Secondary | ICD-10-CM | POA: Diagnosis not present

## 2022-04-03 DIAGNOSIS — M2569 Stiffness of other specified joint, not elsewhere classified: Secondary | ICD-10-CM | POA: Diagnosis not present

## 2022-04-03 DIAGNOSIS — M1612 Unilateral primary osteoarthritis, left hip: Secondary | ICD-10-CM | POA: Diagnosis not present

## 2022-04-03 DIAGNOSIS — M545 Low back pain, unspecified: Secondary | ICD-10-CM | POA: Diagnosis not present

## 2022-04-05 DIAGNOSIS — M1612 Unilateral primary osteoarthritis, left hip: Secondary | ICD-10-CM | POA: Diagnosis not present

## 2022-04-05 DIAGNOSIS — M47817 Spondylosis without myelopathy or radiculopathy, lumbosacral region: Secondary | ICD-10-CM | POA: Diagnosis not present

## 2022-04-05 DIAGNOSIS — M6258 Muscle wasting and atrophy, not elsewhere classified, other site: Secondary | ICD-10-CM | POA: Diagnosis not present

## 2022-04-05 DIAGNOSIS — M9905 Segmental and somatic dysfunction of pelvic region: Secondary | ICD-10-CM | POA: Diagnosis not present

## 2022-04-05 DIAGNOSIS — M4316 Spondylolisthesis, lumbar region: Secondary | ICD-10-CM | POA: Diagnosis not present

## 2022-04-05 DIAGNOSIS — M2569 Stiffness of other specified joint, not elsewhere classified: Secondary | ICD-10-CM | POA: Diagnosis not present

## 2022-04-05 DIAGNOSIS — M545 Low back pain, unspecified: Secondary | ICD-10-CM | POA: Diagnosis not present

## 2022-04-05 DIAGNOSIS — M9904 Segmental and somatic dysfunction of sacral region: Secondary | ICD-10-CM | POA: Diagnosis not present

## 2022-04-05 DIAGNOSIS — M9903 Segmental and somatic dysfunction of lumbar region: Secondary | ICD-10-CM | POA: Diagnosis not present

## 2022-04-10 ENCOUNTER — Encounter: Payer: Self-pay | Admitting: Family Medicine

## 2022-04-10 DIAGNOSIS — M1612 Unilateral primary osteoarthritis, left hip: Secondary | ICD-10-CM | POA: Diagnosis not present

## 2022-04-10 DIAGNOSIS — M9903 Segmental and somatic dysfunction of lumbar region: Secondary | ICD-10-CM | POA: Diagnosis not present

## 2022-04-10 DIAGNOSIS — M9904 Segmental and somatic dysfunction of sacral region: Secondary | ICD-10-CM | POA: Diagnosis not present

## 2022-04-10 DIAGNOSIS — M47817 Spondylosis without myelopathy or radiculopathy, lumbosacral region: Secondary | ICD-10-CM | POA: Diagnosis not present

## 2022-04-10 DIAGNOSIS — M9905 Segmental and somatic dysfunction of pelvic region: Secondary | ICD-10-CM | POA: Diagnosis not present

## 2022-04-10 DIAGNOSIS — M4316 Spondylolisthesis, lumbar region: Secondary | ICD-10-CM | POA: Diagnosis not present

## 2022-04-11 ENCOUNTER — Encounter: Payer: Self-pay | Admitting: Family Medicine

## 2022-04-11 ENCOUNTER — Ambulatory Visit (INDEPENDENT_AMBULATORY_CARE_PROVIDER_SITE_OTHER): Payer: Medicare Other | Admitting: Family Medicine

## 2022-04-11 VITALS — BP 129/80 | HR 57 | Temp 97.9°F | Ht 65.0 in | Wt 138.2 lb

## 2022-04-11 DIAGNOSIS — I10 Essential (primary) hypertension: Secondary | ICD-10-CM | POA: Diagnosis not present

## 2022-04-11 DIAGNOSIS — M5136 Other intervertebral disc degeneration, lumbar region: Secondary | ICD-10-CM | POA: Diagnosis not present

## 2022-04-11 DIAGNOSIS — M48061 Spinal stenosis, lumbar region without neurogenic claudication: Secondary | ICD-10-CM | POA: Diagnosis not present

## 2022-04-11 NOTE — Telephone Encounter (Signed)
I saw [name] today  We had the following plan Trial gabapentin '100mg'$  additional dose midday - has had some sensation of fogginess on med so if worsens that he will pull back Get back in touch with Dr. Reatha Armour about custom brace (lowest risk option and he was told this was covered as well) If the above do not make meaningful impacts- discuss with Dr. Reatha Armour the MILD procedure

## 2022-04-11 NOTE — Progress Notes (Signed)
Phone [phone] In person visit   Subjective:   Ricky Christine. is a 77 y.o. year old very pleasant male patient who presents for/with See problem oriented charting Chief Complaint  Patient presents with   Back Pain    Pt c/o lower back pain on the right side.     Past Medical History-  Patient Active Problem List   Diagnosis Date Noted   Hemiparesis affecting dominant side as late effect of stroke ([place]) 03/08/2016    Priority: High   History of CVA (cerebrovascular accident) 02/28/2016    Priority: High   Emphysema lung ([place]) 08/25/2021    Priority: Medium    Hyperglycemia 08/25/2021    Priority: Medium    Carotid artery stenosis, symptomatic, right 03/19/2016    Priority: Medium    Hyperlipidemia 03/08/2016    Priority: Medium    Essential hypertension 12/20/2014    Priority: Medium    Senile purpura ([place]) 08/25/2021    Priority: Low   Degenerative disc disease, lumbar 02/09/2021    Priority: Low   Acquired leg length discrepancy 02/09/2021    Priority: Low   Leukopenia 01/08/2017    Priority: Low   Trigger middle finger of left hand 11/12/2016    Priority: Low   OA (osteoarthritis) of knee 12/14/2013    Priority: Low    Medications- reviewed and updated Current Outpatient Medications  Medication Sig Dispense Refill   acetaminophen (TYLENOL) 500 MG tablet Take 500-1,000 mg by mouth every 6 (six) hours as needed (for pain.).      albuterol (VENTOLIN HFA) 108 (90 Base) MCG/ACT inhaler Inhale 2 puffs into the lungs every 6 (six) hours as needed for wheezing or shortness of breath. 1 each 0   amLODipine (NORVASC) 2.5 MG tablet TAKE 1 TABLET BY MOUTH EVERY DAY 90 tablet 1   amLODipine (NORVASC) 5 MG tablet Take 1 tablet (5 mg total) by mouth daily. 90 tablet 3   aspirin EC 81 MG tablet Take 81 mg by mouth daily. Swallow whole.     atorvastatin (LIPITOR) 20 MG tablet TAKE 1 TABLET BY MOUTH EVERY OTHER DAY 45 tablet 2   gabapentin (NEURONTIN) 300 MG capsule Take  1 capsule (300 mg total) by mouth 2 (two) times daily. 60 capsule 0   Multiple Vitamin (MULTIVITAMIN WITH MINERALS) TABS tablet Take 1 tablet by mouth daily.     No current facility-administered medications for this visit.     Objective:  BP 129/80 Comment: most recent home reading  Pulse (!) 57   Temp 97.9 F (36.6 C)   Ht '5\' 5"'$  (1.651 m)   Wt 138 lb 3.2 oz (62.7 kg)   SpO2 96%   BMI 23.00 kg/m  Gen: NAD, resting comfortably CV: RRR no murmurs rubs or gallops Lungs: CTAB no crackles, wheeze, rhonchi Ext: no edema Skin: warm, dry MSK: unable to reproduce pain with palpation    Assessment and Plan   # Right low back pain  in patient with known DDD/lumbar stenosis- who has seen Dr. Tamala Julian as well as Dr. Reatha Armour and recently seeing Dr. Joni Fears chiropractor S: ongoing pain and this has been a big barrier to his activity over last year. Has tried multiple therapies including 2 steroid injections and 1 block. Has worked with chiropractor and PT and even had dry needling with minimal success . Worse with prolonged standing -'200mg'$  gabapentin twice daily but not sure how helpful it is and cautious about increase due to potential side effects . Mild  fogginess  -still sees Dr. Davy Pique- sounds like that may be hwo recommended the procedure A/P: for DDD and lumbar stenosis- he asks my opinion about MILD procedure- from information he was given certainly seems like an interesting proposition. Encouraged him to discuss this with Dr. Reatha Armour -also considering custom brace with Dr. Reatha Armour that Dr. Joni Fears recommended -some fogginess with gabapentin possibly- may try an additional '100mg'$  dose midday but if worsens certainly stop  Plan sent to Dr. Tamala Julian  "We had the following plan Trial gabapentin '100mg'$  additional dose midday - has had some sensation of fogginess on med so if worsens that he will pull back Get back in touch with Dr. Reatha Armour about custom brace (lowest risk option and he was told this  was covered as well) If the above do not make meaningful impacts- discuss with Dr. Reatha Armour the MILD procedure"  #hypertension S: medication: Amlodipine 7.5 mg Home readings #s: has home cuff - sometimes #s high but trends down on repeat with rest.  BP Readings from Last 3 Encounters:  04/11/22 129/80  03/05/22 (!) 157/76  02/26/22 115/79  A/P: excellent control overall at home- continue current medications    Recommended follow up: Return for next already scheduled visit or sooner if needed. Future Appointments  Date Time Provider [place]  05/04/2022 10:30 AM [facility] HEALTH COACH [facility]  08/28/2022  9:20 AM Yong Channel Brayton Mars, MD [facility]   Lab/Order associations:   ICD-10-CM   1. Essential hypertension  I10     2. DDD (degenerative disc disease), lumbar  M51.36     3. Spinal stenosis of lumbar region, unspecified whether neurogenic claudication present  M48.061       No orders of the defined types were placed in this encounter.   Return precautions advised.  Garret Reddish, MD

## 2022-04-11 NOTE — Patient Instructions (Addendum)
We had the following plan Trial gabapentin '100mg'$  additional dose midday - has had some sensation of fogginess on med so if worsens that he will pull back Get back in touch with Dr. Reatha Armour about custom brace (lowest risk option and he was told this was covered as well) If the above do not make meaningful impacts- discuss with Dr. Reatha Armour or Dr. Davy Pique the MILD procedure  Recommended follow up: Return for next already scheduled visit or sooner if needed.

## 2022-04-12 DIAGNOSIS — M9905 Segmental and somatic dysfunction of pelvic region: Secondary | ICD-10-CM | POA: Diagnosis not present

## 2022-04-12 DIAGNOSIS — M47817 Spondylosis without myelopathy or radiculopathy, lumbosacral region: Secondary | ICD-10-CM | POA: Diagnosis not present

## 2022-04-12 DIAGNOSIS — M9903 Segmental and somatic dysfunction of lumbar region: Secondary | ICD-10-CM | POA: Diagnosis not present

## 2022-04-12 DIAGNOSIS — M9904 Segmental and somatic dysfunction of sacral region: Secondary | ICD-10-CM | POA: Diagnosis not present

## 2022-04-12 DIAGNOSIS — M1612 Unilateral primary osteoarthritis, left hip: Secondary | ICD-10-CM | POA: Diagnosis not present

## 2022-04-12 DIAGNOSIS — M4316 Spondylolisthesis, lumbar region: Secondary | ICD-10-CM | POA: Diagnosis not present

## 2022-04-17 DIAGNOSIS — M47817 Spondylosis without myelopathy or radiculopathy, lumbosacral region: Secondary | ICD-10-CM | POA: Diagnosis not present

## 2022-04-17 DIAGNOSIS — M9905 Segmental and somatic dysfunction of pelvic region: Secondary | ICD-10-CM | POA: Diagnosis not present

## 2022-04-17 DIAGNOSIS — M9903 Segmental and somatic dysfunction of lumbar region: Secondary | ICD-10-CM | POA: Diagnosis not present

## 2022-04-17 DIAGNOSIS — M9904 Segmental and somatic dysfunction of sacral region: Secondary | ICD-10-CM | POA: Diagnosis not present

## 2022-04-17 DIAGNOSIS — M1612 Unilateral primary osteoarthritis, left hip: Secondary | ICD-10-CM | POA: Diagnosis not present

## 2022-04-17 DIAGNOSIS — M4316 Spondylolisthesis, lumbar region: Secondary | ICD-10-CM | POA: Diagnosis not present

## 2022-04-19 ENCOUNTER — Telehealth: Payer: Self-pay | Admitting: Family Medicine

## 2022-04-19 DIAGNOSIS — M9903 Segmental and somatic dysfunction of lumbar region: Secondary | ICD-10-CM | POA: Diagnosis not present

## 2022-04-19 DIAGNOSIS — M9905 Segmental and somatic dysfunction of pelvic region: Secondary | ICD-10-CM | POA: Diagnosis not present

## 2022-04-19 DIAGNOSIS — M1612 Unilateral primary osteoarthritis, left hip: Secondary | ICD-10-CM | POA: Diagnosis not present

## 2022-04-19 DIAGNOSIS — M4316 Spondylolisthesis, lumbar region: Secondary | ICD-10-CM | POA: Diagnosis not present

## 2022-04-19 DIAGNOSIS — M9904 Segmental and somatic dysfunction of sacral region: Secondary | ICD-10-CM | POA: Diagnosis not present

## 2022-04-19 DIAGNOSIS — M47817 Spondylosis without myelopathy or radiculopathy, lumbosacral region: Secondary | ICD-10-CM | POA: Diagnosis not present

## 2022-04-19 NOTE — Telephone Encounter (Signed)
Copied from [place] [id number]. Topic: Medicare AWV >> Apr 19, 2022  1:16 PM Gillis Santa wrote: Reason for CRM: Called patient to schedule Medicare Annual Wellness Visit (AWV). Left message for patient to call back and schedule Medicare Annual Wellness Visit (AWV).  Last date of AWV: 04/21/2021  Please schedule an appointment at any time with Otila Kluver, [facility].  Please reschedule AWVS with health coach Otila Kluver, [place].  If any questions, please contact me at [phone].  Thank you ,  Shaune Pollack [facility] AWV TEAM Direct Dial [phone]

## 2022-04-20 ENCOUNTER — Encounter: Payer: Self-pay | Admitting: Family Medicine

## 2022-04-23 ENCOUNTER — Encounter: Payer: Self-pay | Admitting: Family Medicine

## 2022-04-23 DIAGNOSIS — M1612 Unilateral primary osteoarthritis, left hip: Secondary | ICD-10-CM | POA: Diagnosis not present

## 2022-04-23 DIAGNOSIS — M9905 Segmental and somatic dysfunction of pelvic region: Secondary | ICD-10-CM | POA: Diagnosis not present

## 2022-04-23 DIAGNOSIS — M9904 Segmental and somatic dysfunction of sacral region: Secondary | ICD-10-CM | POA: Diagnosis not present

## 2022-04-23 DIAGNOSIS — M4316 Spondylolisthesis, lumbar region: Secondary | ICD-10-CM | POA: Diagnosis not present

## 2022-04-23 DIAGNOSIS — M47817 Spondylosis without myelopathy or radiculopathy, lumbosacral region: Secondary | ICD-10-CM | POA: Diagnosis not present

## 2022-04-23 DIAGNOSIS — M9903 Segmental and somatic dysfunction of lumbar region: Secondary | ICD-10-CM | POA: Diagnosis not present

## 2022-04-24 ENCOUNTER — Other Ambulatory Visit: Payer: Self-pay

## 2022-04-24 DIAGNOSIS — M216X9 Other acquired deformities of unspecified foot: Secondary | ICD-10-CM

## 2022-04-25 DIAGNOSIS — M47817 Spondylosis without myelopathy or radiculopathy, lumbosacral region: Secondary | ICD-10-CM | POA: Diagnosis not present

## 2022-04-25 DIAGNOSIS — M9905 Segmental and somatic dysfunction of pelvic region: Secondary | ICD-10-CM | POA: Diagnosis not present

## 2022-04-25 DIAGNOSIS — M9904 Segmental and somatic dysfunction of sacral region: Secondary | ICD-10-CM | POA: Diagnosis not present

## 2022-04-25 DIAGNOSIS — M1612 Unilateral primary osteoarthritis, left hip: Secondary | ICD-10-CM | POA: Diagnosis not present

## 2022-04-25 DIAGNOSIS — M9903 Segmental and somatic dysfunction of lumbar region: Secondary | ICD-10-CM | POA: Diagnosis not present

## 2022-04-25 DIAGNOSIS — M4316 Spondylolisthesis, lumbar region: Secondary | ICD-10-CM | POA: Diagnosis not present

## 2022-04-26 DIAGNOSIS — H18452 Nodular corneal degeneration, left eye: Secondary | ICD-10-CM | POA: Diagnosis not present

## 2022-04-27 ENCOUNTER — Ambulatory Visit: Payer: Medicare Other | Admitting: Sports Medicine

## 2022-04-27 ENCOUNTER — Ambulatory Visit (INDEPENDENT_AMBULATORY_CARE_PROVIDER_SITE_OTHER): Payer: Medicare Other | Admitting: Sports Medicine

## 2022-04-27 VITALS — BP 134/80 | Ht 65.0 in | Wt 137.0 lb

## 2022-04-27 DIAGNOSIS — M2170 Unequal limb length (acquired), unspecified site: Secondary | ICD-10-CM

## 2022-04-27 NOTE — Progress Notes (Signed)
  Ricky [name]. - 77 y.o. male MRN [id number]  Date of birth: 01-01-1946    CHIEF COMPLAINT:   Bilateral foot pain    SUBJECTIVE:   HPI:  Pleasant 77 year old male with history of acquired leg length discrepancy is here for for bilateral foot pain and to be fitted for custom orthotics.  He previously has been evaluated at [facility] sports medicine and referred here for custom orthotics.  He has had a leg length discrepancy ever since he had his left knee replaced.  His left leg is now longer than his right leg.  He is also has some pain at the forefoot with collapse of his transverse arch.  He has never had custom orthotics before.  ROS:     See HPI  PERTINENT  PMH / PSH FH / / SH:  Past Medical, Surgical, Social, and Family History Reviewed & Updated in the EMR.  Pertinent findings include:  none  OBJECTIVE: BP 134/80   Ht 5\' 5"  (1.651 m)   Wt 137 lb (62.1 kg)   BMI 22.80 kg/m   Physical Exam:  Vital signs are reviewed.  GEN: Alert and oriented, NAD Pulm: Breathing unlabored PSY: normal mood, congruent affect  MSK: Feet -good maintenance of the longitudinal arch on the right foot.  Mild loss of longitudinal arch height on the left.  Bilateral collapse of transverse arch resulting in toe splaying and early hammering of the toes.  Neutral alignment on the right.  Mild hindfoot valgus on the left.  Asymmetric heel inversion with toe raise, minimal heel inversion on the left.  Normal heel inversion on the right.  Nontender to palpation on the feet bilaterally.  Full range of motion in the ankles bilaterally.  Leg lengths:  Right leg -83 cm Left leg -85 cm  ASSESSMENT & PLAN:  1.  Leg length discrepancy -Patient fitted for custom orthotics today.  Will provide additional padding on the right orthotic to account for his leg length discrepancy.  Will have him follow-up as needed for adjustments of the orthotics or if he wants additional pairs made.  PROCEDURE NOTE: Patient was  fitted for a : standard, cushioned, semi-rigid orthotic. The orthotic was heated and afterward the patient stood on the orthotic blank positioned on the orthotic stand. The patient was positioned in subtalar neutral position and 10 degrees of ankle dorsiflexion in a weight bearing stance. After completion of molding, a stable base was applied to the orthotic blank. The blank was ground to a stable position for weight bearing. Size: 9 Base: blue EVA Posting: none Additional orthotic padding: extra blue base on right  Gait was neutral with orthotics in place. Patient found them to be comfortable. Follow-up as needed.   Dortha Kern, MD PGY-4, Sports Medicine Fellow [place]  Addendum:  I was the preceptor for this visit and available for immediate consultation.  Karlton Lemon MD Kirt Boys

## 2022-05-07 DIAGNOSIS — M47817 Spondylosis without myelopathy or radiculopathy, lumbosacral region: Secondary | ICD-10-CM | POA: Diagnosis not present

## 2022-05-07 DIAGNOSIS — M9905 Segmental and somatic dysfunction of pelvic region: Secondary | ICD-10-CM | POA: Diagnosis not present

## 2022-05-07 DIAGNOSIS — M1612 Unilateral primary osteoarthritis, left hip: Secondary | ICD-10-CM | POA: Diagnosis not present

## 2022-05-07 DIAGNOSIS — M9904 Segmental and somatic dysfunction of sacral region: Secondary | ICD-10-CM | POA: Diagnosis not present

## 2022-05-07 DIAGNOSIS — M4316 Spondylolisthesis, lumbar region: Secondary | ICD-10-CM | POA: Diagnosis not present

## 2022-05-07 DIAGNOSIS — M9903 Segmental and somatic dysfunction of lumbar region: Secondary | ICD-10-CM | POA: Diagnosis not present

## 2022-05-08 ENCOUNTER — Ambulatory Visit (INDEPENDENT_AMBULATORY_CARE_PROVIDER_SITE_OTHER): Payer: Medicare Other

## 2022-05-08 VITALS — BP 120/80 | Temp 97.5°F | Wt 139.4 lb

## 2022-05-08 DIAGNOSIS — Z0000 Encounter for general adult medical examination without abnormal findings: Secondary | ICD-10-CM

## 2022-05-08 NOTE — Patient Instructions (Signed)
Ricky [name] , Thank you for taking time to come for your Medicare Wellness Visit. I appreciate your ongoing commitment to your health goals. Please review the following plan we discussed and let me know if I can assist you in the future.   These are the goals we discussed:  Goals      Patient Stated     To maintain your health; keep weight in check Keep exercises      Patient Stated     Get a few more muscle weight      Patient Stated     None at this time         This is a list of the screening recommended for you and due dates:  Health Maintenance  Topic Date Due   COVID-19 Vaccine (8 - 2023-24 season) 01/05/2022   Flu Shot  09/06/2022   Medicare Annual Wellness Visit  05/08/2023   DTaP/Tdap/Td vaccine (2 - Tdap) 04/19/2024   Pneumonia Vaccine  Completed   Hepatitis C Screening: USPSTF Recommendation to screen - Ages 18-79 yo.  Completed   Zoster (Shingles) Vaccine  Completed   HPV Vaccine  Aged Out   Cologuard (Stool DNA test)  Discontinued    Advanced directives: copies in chart   Conditions/risks identified: none at this time  Next appointment: Follow up in one year for your annual wellness visit.   Preventive Care 9 Years and Older, Male  Preventive care refers to lifestyle choices and visits with your health care provider that can promote health and wellness. What does preventive care include? A yearly physical exam. This is also called an annual well check. Dental exams once or twice a year. Routine eye exams. Ask your health care provider how often you should have your eyes checked. Personal lifestyle choices, including: Daily care of your teeth and gums. Regular physical activity. Eating a healthy diet. Avoiding tobacco and drug use. Limiting alcohol use. Practicing safe sex. Taking low doses of aspirin every day. Taking vitamin and mineral supplements as recommended by your health care provider. What happens during an annual well check? The services  and screenings done by your health care provider during your annual well check will depend on your age, overall health, lifestyle risk factors, and family history of disease. Counseling  Your health care provider may ask you questions about your: Alcohol use. Tobacco use. Drug use. Emotional well-being. Home and relationship well-being. Sexual activity. Eating habits. History of falls. Memory and ability to understand (cognition). Work and work Statistician. Screening  You may have the following tests or measurements: Height, weight, and BMI. Blood pressure. Lipid and cholesterol levels. These may be checked every 5 years, or more frequently if you are over 9 years old. Skin check. Lung cancer screening. You may have this screening every year starting at age 47 if you have a 30-pack-year history of smoking and currently smoke or have quit within the past 15 years. Fecal occult blood test (FOBT) of the stool. You may have this test every year starting at age 30. Flexible sigmoidoscopy or colonoscopy. You may have a sigmoidoscopy every 5 years or a colonoscopy every 10 years starting at age 64. Prostate cancer screening. Recommendations will vary depending on your family history and other risks. Hepatitis C blood test. Hepatitis B blood test. Sexually transmitted disease (STD) testing. Diabetes screening. This is done by checking your blood sugar (glucose) after you have not eaten for a while (fasting). You may have this done every 1-3  years. Abdominal aortic aneurysm (AAA) screening. You may need this if you are a current or former smoker. Osteoporosis. You may be screened starting at age 48 if you are at high risk. Talk with your health care provider about your test results, treatment options, and if necessary, the need for more tests. Vaccines  Your health care provider may recommend certain vaccines, such as: Influenza vaccine. This is recommended every year. Tetanus, diphtheria,  and acellular pertussis (Tdap, Td) vaccine. You may need a Td booster every 10 years. Zoster vaccine. You may need this after age 27. Pneumococcal 13-valent conjugate (PCV13) vaccine. One dose is recommended after age 62. Pneumococcal polysaccharide (PPSV23) vaccine. One dose is recommended after age 8. Talk to your health care provider about which screenings and vaccines you need and how often you need them. This information is not intended to replace advice given to you by your health care provider. Make sure you discuss any questions you have with your health care provider. Document Released: 02/18/2015 Document Revised: 10/12/2015 Document Reviewed: 11/23/2014 Elsevier Interactive Patient Education  2017 [place] Prevention in the Home Falls can cause injuries. They can happen to people of all ages. There are many things you can do to make your home safe and to help prevent falls. What can I do on the outside of my home? Regularly fix the edges of walkways and driveways and fix any cracks. Remove anything that might make you trip as you walk through a door, such as a raised step or threshold. Trim any bushes or trees on the path to your home. Use bright outdoor lighting. Clear any walking paths of anything that might make someone trip, such as rocks or tools. Regularly check to see if handrails are loose or broken. Make sure that both sides of any steps have handrails. Any raised decks and porches should have guardrails on the edges. Have any leaves, snow, or ice cleared regularly. Use sand or salt on walking paths during winter. Clean up any spills in your garage right away. This includes oil or grease spills. What can I do in the bathroom? Use night lights. Install grab bars by the toilet and in the tub and shower. Do not use towel bars as grab bars. Use non-skid mats or decals in the tub or shower. If you need to sit down in the shower, use a plastic, non-slip  stool. Keep the floor dry. Clean up any water that spills on the floor as soon as it happens. Remove soap buildup in the tub or shower regularly. Attach bath mats securely with double-sided non-slip rug tape. Do not have throw rugs and other things on the floor that can make you trip. What can I do in the bedroom? Use night lights. Make sure that you have a light by your bed that is easy to reach. Do not use any sheets or blankets that are too big for your bed. They should not hang down onto the floor. Have a firm chair that has side arms. You can use this for support while you get dressed. Do not have throw rugs and other things on the floor that can make you trip. What can I do in the kitchen? Clean up any spills right away. Avoid walking on wet floors. Keep items that you use a lot in easy-to-reach places. If you need to reach something above you, use a strong step stool that has a grab bar. Keep electrical cords out of the way. Do  not use floor polish or wax that makes floors slippery. If you must use wax, use non-skid floor wax. Do not have throw rugs and other things on the floor that can make you trip. What can I do with my stairs? Do not leave any items on the stairs. Make sure that there are handrails on both sides of the stairs and use them. Fix handrails that are broken or loose. Make sure that handrails are as long as the stairways. Check any carpeting to make sure that it is firmly attached to the stairs. Fix any carpet that is loose or worn. Avoid having throw rugs at the top or bottom of the stairs. If you do have throw rugs, attach them to the floor with carpet tape. Make sure that you have a light switch at the top of the stairs and the bottom of the stairs. If you do not have them, ask someone to add them for you. What else can I do to help prevent falls? Wear shoes that: Do not have high heels. Have rubber bottoms. Are comfortable and fit you well. Are closed at the  toe. Do not wear sandals. If you use a stepladder: Make sure that it is fully opened. Do not climb a closed stepladder. Make sure that both sides of the stepladder are locked into place. Ask someone to hold it for you, if possible. Clearly mark and make sure that you can see: Any grab bars or handrails. First and last steps. Where the edge of each step is. Use tools that help you move around (mobility aids) if they are needed. These include: Canes. Walkers. Scooters. Crutches. Turn on the lights when you go into a dark area. Replace any light bulbs as soon as they burn out. Set up your furniture so you have a clear path. Avoid moving your furniture around. If any of your floors are uneven, fix them. If there are any pets around you, be aware of where they are. Review your medicines with your doctor. Some medicines can make you feel dizzy. This can increase your chance of falling. Ask your doctor what other things that you can do to help prevent falls. This information is not intended to replace advice given to you by your health care provider. Make sure you discuss any questions you have with your health care provider. Document Released: 11/18/2008 Document Revised: 06/30/2015 Document Reviewed: 02/26/2014 Elsevier Interactive Patient Education  2017 [employer].

## 2022-05-08 NOTE — Progress Notes (Signed)
Subjective:   Ricky [name]. is a 77 y.o. male who presents for Medicare Annual/Subsequent preventive examination.    Patient Medicare AWV questionnaire was completed by the patient on 05/05/22; I have confirmed that all information answered by patient is correct and no changes since this date.     Review of Systems     Cardiac Risk Factors include: advanced age (>30men, >72 women);dyslipidemia;hypertension;male gender     Objective:    Today's Vitals   05/08/22 0955  BP: 120/80  Temp: (!) 97.5 F (36.4 C)  SpO2: 97%  Weight: 139 lb 6.4 oz (63.2 kg)   Body mass index is 23.2 kg/m.     05/08/2022   10:04 AM 04/21/2021   10:24 AM 10/01/2019   11:58 AM 09/22/2018   12:29 PM 04/25/2017    8:41 AM 01/10/2017    8:26 AM 10/18/2016    8:41 AM  Advanced Directives  Does Patient Have a Medical Advance Directive? Yes Yes No Yes Yes Yes Yes  Type of Paramedic of [place];Living will Healthcare Power of [place];Living will [place];Living will  [place];Living will  Does patient want to make changes to medical advance directive? No - Patient declined   No - Patient declined     Copy of [place] in Chart? Yes - validated most recent copy scanned in chart (See row information) Yes - validated most recent copy scanned in chart (See row information)  No - copy requested No - copy requested  No - copy requested  Would patient like information on creating a medical advance directive?    Yes (MAU/Ambulatory/Procedural Areas - Information given)       Current Medications (verified) Outpatient Encounter Medications as of 05/08/2022  Medication Sig   acetaminophen (TYLENOL) 500 MG tablet Take 500-1,000 mg by mouth every 6 (six) hours as needed (for pain.).    albuterol (VENTOLIN HFA) 108 (90 Base) MCG/ACT inhaler Inhale 2 puffs into the lungs every 6 (six) hours as needed for  wheezing or shortness of breath.   amLODipine (NORVASC) 2.5 MG tablet TAKE 1 TABLET BY MOUTH EVERY DAY   amLODipine (NORVASC) 5 MG tablet Take 1 tablet (5 mg total) by mouth daily.   aspirin EC 81 MG tablet Take 81 mg by mouth daily. Swallow whole.   atorvastatin (LIPITOR) 20 MG tablet TAKE 1 TABLET BY MOUTH EVERY OTHER DAY   gabapentin (NEURONTIN) 300 MG capsule Take 1 capsule (300 mg total) by mouth 2 (two) times daily.   Multiple Vitamin (MULTIVITAMIN WITH MINERALS) TABS tablet Take 1 tablet by mouth daily.   No facility-administered encounter medications on file as of 05/08/2022.    Allergies (verified) Amoxicillin   History: Past Medical History:  Diagnosis Date   Arthritis    Carotid artery occlusion    Complication of anesthesia    slow to wake up after shoulder surgeries   Hyperlipidemia 03/08/2016   LDL 75 pre stroke. Goal <70 so on atorvastatin 20mg  every other day   Hypertension    Peripheral vascular disease    Pneumonia    Stroke    still has decreased sensation in left hand and arm (as of 03/09/16)   Past Surgical History:  Procedure Laterality Date   CAROTID ENDARTERECTOMY     ENDARTERECTOMY Right 03/19/2016   Procedure: ENDARTERECTOMY CAROTID;  Surgeon: Elam Dutch, MD;  Location: [place];  Service: Vascular;  Laterality:  Right;   PATCH ANGIOPLASTY Right 03/19/2016   Procedure: PATCH ANGIOPLASTY OF RIGHT CAROTID USING HEMASHIELD PLATINUM FINESSE;  Surgeon: Elam Dutch, MD;  Location: [place];  Service: Vascular;  Laterality: Right;   SHOULDER SURGERY Bilateral    left- injections intermittent now  Rotator cuff repair on both shoulders, 2 different surgeries   TONSILLECTOMY     TOTAL KNEE ARTHROPLASTY Left 12/14/2013   Procedure: LEFT TOTAL KNEE ARTHROPLASTY;  Surgeon: Gearlean Alf, MD;  Location: [facility];  Service: Orthopedics;  Laterality: Left;   Family History  Problem Relation Age of Onset   Heart disease Father        in 22s- specifics unclear    Other Mother        passed in late 83s   Social History   Socioeconomic History   Marital status: Married    Spouse name: Not on file   Number of children: Not on file   Years of education: Not on file   Highest education level: Not on file  Occupational History   Occupation: Retired  Tobacco Use   Smoking status: Never    Passive exposure: Never   Smokeless tobacco: Never  Substance and Sexual Activity   Alcohol use: No    Comment: former drinker   Drug use: No   Sexual activity: Yes  Other Topics Concern   Not on file  Social History Narrative   Married. 1 son. 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       Hobbies: gym daily- riding exercise bike plus weights/machines, photography- used to do a lot of black and white, gardening   Social Determinants of Health   Financial Resource Strain: Low Risk  (05/05/2022)   Overall Financial Resource Strain (CARDIA)    Difficulty of Paying Living Expenses: Not hard at all  Food Insecurity: No Food Insecurity (05/05/2022)   Hunger Vital Sign    Worried About Running Out of Food in the Last Year: Never true    Ran Out of Food in the Last Year: Never true  Transportation Needs: No Transportation Needs (05/05/2022)   PRAPARE - Hydrologist (Medical): No    Lack of Transportation (Non-Medical): No  Physical Activity: Insufficiently Active (05/05/2022)   Exercise Vital Sign    Days of Exercise per Week: 2 days    Minutes of Exercise per Session: 70 min  Stress: No Stress Concern Present (05/05/2022)   [place]    Feeling of Stress : Only a little  Social Connections: Moderately Isolated (05/05/2022)   Social Connection and Isolation Panel [NHANES]    Frequency of Communication with Friends and Family: Once a week    Frequency of Social Gatherings with Friends and Family: Once a  week    Attends Religious Services: Never    Marine scientist or Organizations: Yes    Attends Music therapist: More than 4 times per year    Marital Status: Married    Tobacco Counseling Counseling given: Not Answered   Clinical Intake:  Pre-visit preparation completed: Yes  Pain : No/denies pain     BMI - recorded: 23.2 Nutritional Status: BMI of 19-24  Normal Nutritional Risks: None Diabetes: No  How often do you need to have someone help you when you read instructions, pamphlets, or other written materials from your doctor or pharmacy?:  1 - Never  Diabetic?no  Interpreter Needed?: No  Information entered by :: Charlott Rakes, LPN   Activities of Daily Living    05/05/2022    8:55 AM  In your present state of health, do you have any difficulty performing the following activities:  Hearing? 0  Vision? 0  Difficulty concentrating or making decisions? 0  Walking or climbing stairs? 1  Comment with left knee and back  Dressing or bathing? 0  Doing errands, shopping? 0  Preparing Food and eating ? N  Using the Toilet? N  In the past six months, have you accidently leaked urine? Y  Comment at times  Do you have problems with loss of bowel control? N  Managing your Medications? N  Managing your Finances? N    Patient Care Team: Marin Olp, MD as PCP - General (Family Medicine)  Indicate any recent Medical Services you may have received from other than [facility] providers in the past year (date may be approximate).     Assessment:   This is a routine wellness examination for [place].  Hearing/Vision screen Hearing Screening - Comments:: Pt denies any hearing issues  Vision Screening - Comments:: Pt follows up with Dr Alanda Slim for annual eye exams   Dietary issues and exercise activities discussed: Current Exercise Habits: Home exercise routine, Type of exercise: Other - see comments, Time (Minutes): > 60, Frequency (Times/Week): 2,  Weekly Exercise (Minutes/Week): 0   Goals Addressed             This Visit's Progress    Patient Stated       None at this time        Depression Screen    05/08/2022   10:03 AM 04/21/2021   10:22 AM 10/27/2020   12:56 PM 08/16/2020    8:05 AM 10/01/2019   11:57 AM 08/03/2019    7:51 AM 01/26/2019    7:55 AM  PHQ 2/9 Scores  PHQ - 2 Score 0 0 0 0 0 0 0    Fall Risk    05/05/2022    8:55 AM 04/21/2021   10:24 AM 10/27/2020   12:56 PM 08/16/2020    8:05 AM 02/17/2020    8:33 AM  Fall Risk   Falls in the past year? 1 1 0 0 0  Number falls in past yr: 0 1 0 0 0  Injury with Fall? 0 1 0 0 0  Comment  cut lip and broke glasses     Risk for fall due to : Impaired vision Impaired vision No Fall Risks No Fall Risks   Follow up Falls prevention discussed Falls prevention discussed Falls evaluation completed Falls evaluation completed     FALL RISK PREVENTION PERTAINING TO THE HOME:  Any stairs in or around the home? Yes  If so, are there any without handrails? No  Home free of loose throw rugs in walkways, pet beds, electrical cords, etc? Yes  Adequate lighting in your home to reduce risk of falls? Yes   ASSISTIVE DEVICES UTILIZED TO PREVENT FALLS:  Life alert? No  Use of a cane, walker or w/c? No  Grab bars in the bathroom? No  Shower chair or bench in shower? No  Elevated toilet seat or a handicapped toilet? No   TIMED UP AND GO:  Was the test performed? Yes .  Length of time to ambulate 10 feet: 10 sec.   Gait steady and fast without use of assistive device  Cognitive Function:  05/08/2022   10:07 AM 04/21/2021   10:26 AM 10/01/2019   12:04 PM 09/22/2018   12:33 PM  6CIT Screen  What Year? 0 points 0 points 0 points 0 points  What month? 0 points 0 points 0 points 0 points  What time? 0 points 0 points  0 points  Count back from 20 0 points 0 points 0 points 0 points  Months in reverse 0 points 0 points 0 points 0 points  Repeat phrase 0 points 0 points  0 points 0 points  Total Score 0 points 0 points  0 points    Immunizations Immunization History  Administered Date(s) Administered   Influenza, High Dose Seasonal PF 01/10/2016, 11/12/2016, 12/10/2017, 09/28/2018, 10/18/2019, 11/11/2020, 11/10/2021   Influenza,inj,Quad PF,6+ Mos 11/30/2013, 12/20/2014   PFIZER Comirnaty(Gray Top)Covid-19 Tri-Sucrose Vaccine 05/08/2020, 11/10/2021   PFIZER(Purple Top)SARS-COV-2 Vaccination 02/16/2019, 03/07/2019, 11/11/2019   Pfizer Covid-19 Vaccine Bivalent Booster 66yrs & up 11/11/2020, 07/06/2021   Pneumococcal Conjugate-13 11/30/2013   Pneumococcal Polysaccharide-23 12/20/2014   Respiratory Syncytial Virus Vaccine,Recomb Aduvanted(Arexvy) 12/01/2021   Td 04/20/2014   Zoster Recombinat (Shingrix) 09/27/2018, 12/11/2018    TDAP status: Up to date  Flu Vaccine status: Up to date  Pneumococcal vaccine status: Up to date  Covid-19 vaccine status: Completed vaccines  Qualifies for Shingles Vaccine? Yes   Zostavax completed Yes   Shingrix Completed?: Yes  Screening Tests Health Maintenance  Topic Date Due   COVID-19 Vaccine (8 - 2023-24 season) 01/05/2022   INFLUENZA VACCINE  09/06/2022   Medicare Annual Wellness (AWV)  05/08/2023   DTaP/Tdap/Td (2 - Tdap) 04/19/2024   Pneumonia Vaccine 28+ Years old  Completed   Hepatitis C Screening  Completed   Zoster Vaccines- Shingrix  Completed   HPV VACCINES  Aged Out   Fecal DNA (Cologuard)  Discontinued    Health Maintenance  Health Maintenance Due  Topic Date Due   COVID-19 Vaccine (8 - 2023-24 season) 01/05/2022    Colorectal cancer screening: Type of screening: Cologuard. Completed 02/11/19. Repeat every as directed  years   Additional Screening:  Hepatitis C Screening:  Completed 12/20/14  Vision Screening: Recommended annual ophthalmology exams for early detection of glaucoma and other disorders of the eye. Is the patient up to date with their annual eye exam?  Yes  Who is the  provider or what is the name of the office in which the patient attends annual eye exams? Dr Alanda Slim  If pt is not established with a provider, would they like to be referred to a provider to establish care? No .   Dental Screening: Recommended annual dental exams for proper oral hygiene  Community Resource Referral / Chronic Care Management: CRR required this visit?  No   CCM required this visit?  No      Plan:     I have personally reviewed and noted the following in the patient's chart:   Medical and social history Use of alcohol, tobacco or illicit drugs  Current medications and supplements including opioid prescriptions. Patient is not currently taking opioid prescriptions. Functional ability and status Nutritional status Physical activity Advanced directives List of other physicians Hospitalizations, surgeries, and ER visits in previous 12 months Vitals Screenings to include cognitive, depression, and falls Referrals and appointments  In addition, I have reviewed and discussed with patient certain preventive protocols, quality metrics, and best practice recommendations. A written personalized care plan for preventive services as well as general preventive health recommendations were provided to patient.  Willette Brace, LPN   [id number]   Nurse Notes: none

## 2022-05-10 ENCOUNTER — Encounter: Payer: Self-pay | Admitting: Family Medicine

## 2022-05-14 DIAGNOSIS — H18452 Nodular corneal degeneration, left eye: Secondary | ICD-10-CM | POA: Diagnosis not present

## 2022-05-14 DIAGNOSIS — H25812 Combined forms of age-related cataract, left eye: Secondary | ICD-10-CM | POA: Diagnosis not present

## 2022-05-14 DIAGNOSIS — H25811 Combined forms of age-related cataract, right eye: Secondary | ICD-10-CM | POA: Diagnosis not present

## 2022-05-15 DIAGNOSIS — M9904 Segmental and somatic dysfunction of sacral region: Secondary | ICD-10-CM | POA: Diagnosis not present

## 2022-05-15 DIAGNOSIS — M9905 Segmental and somatic dysfunction of pelvic region: Secondary | ICD-10-CM | POA: Diagnosis not present

## 2022-05-15 DIAGNOSIS — M1612 Unilateral primary osteoarthritis, left hip: Secondary | ICD-10-CM | POA: Diagnosis not present

## 2022-05-15 DIAGNOSIS — M4316 Spondylolisthesis, lumbar region: Secondary | ICD-10-CM | POA: Diagnosis not present

## 2022-05-15 DIAGNOSIS — M47817 Spondylosis without myelopathy or radiculopathy, lumbosacral region: Secondary | ICD-10-CM | POA: Diagnosis not present

## 2022-05-15 DIAGNOSIS — M9903 Segmental and somatic dysfunction of lumbar region: Secondary | ICD-10-CM | POA: Diagnosis not present

## 2022-05-16 ENCOUNTER — Encounter: Payer: Self-pay | Admitting: Family Medicine

## 2022-05-19 ENCOUNTER — Other Ambulatory Visit: Payer: Self-pay | Admitting: Family Medicine

## 2022-05-21 ENCOUNTER — Other Ambulatory Visit: Payer: Self-pay

## 2022-05-21 MED ORDER — GABAPENTIN 300 MG PO CAPS: 300.0000 mg | ORAL_CAPSULE | Freq: Two times a day (BID) | ORAL | 2 refills | Status: DC

## 2022-05-22 DIAGNOSIS — M9904 Segmental and somatic dysfunction of sacral region: Secondary | ICD-10-CM | POA: Diagnosis not present

## 2022-05-22 DIAGNOSIS — M47817 Spondylosis without myelopathy or radiculopathy, lumbosacral region: Secondary | ICD-10-CM | POA: Diagnosis not present

## 2022-05-22 DIAGNOSIS — M4316 Spondylolisthesis, lumbar region: Secondary | ICD-10-CM | POA: Diagnosis not present

## 2022-05-22 DIAGNOSIS — M9905 Segmental and somatic dysfunction of pelvic region: Secondary | ICD-10-CM | POA: Diagnosis not present

## 2022-05-22 DIAGNOSIS — M1612 Unilateral primary osteoarthritis, left hip: Secondary | ICD-10-CM | POA: Diagnosis not present

## 2022-05-22 DIAGNOSIS — M9903 Segmental and somatic dysfunction of lumbar region: Secondary | ICD-10-CM | POA: Diagnosis not present

## 2022-05-25 ENCOUNTER — Encounter: Payer: Self-pay | Admitting: Family Medicine

## 2022-05-27 ENCOUNTER — Other Ambulatory Visit: Payer: Self-pay | Admitting: Family Medicine

## 2022-05-29 DIAGNOSIS — M4316 Spondylolisthesis, lumbar region: Secondary | ICD-10-CM | POA: Diagnosis not present

## 2022-05-29 DIAGNOSIS — M9905 Segmental and somatic dysfunction of pelvic region: Secondary | ICD-10-CM | POA: Diagnosis not present

## 2022-05-29 DIAGNOSIS — M1612 Unilateral primary osteoarthritis, left hip: Secondary | ICD-10-CM | POA: Diagnosis not present

## 2022-05-29 DIAGNOSIS — M47817 Spondylosis without myelopathy or radiculopathy, lumbosacral region: Secondary | ICD-10-CM | POA: Diagnosis not present

## 2022-05-29 DIAGNOSIS — M9904 Segmental and somatic dysfunction of sacral region: Secondary | ICD-10-CM | POA: Diagnosis not present

## 2022-05-29 DIAGNOSIS — M9903 Segmental and somatic dysfunction of lumbar region: Secondary | ICD-10-CM | POA: Diagnosis not present

## 2022-05-31 ENCOUNTER — Other Ambulatory Visit: Payer: Self-pay | Admitting: Family Medicine

## 2022-05-31 DIAGNOSIS — H18452 Nodular corneal degeneration, left eye: Secondary | ICD-10-CM | POA: Diagnosis not present

## 2022-05-31 DIAGNOSIS — H25812 Combined forms of age-related cataract, left eye: Secondary | ICD-10-CM | POA: Diagnosis not present

## 2022-05-31 DIAGNOSIS — H269 Unspecified cataract: Secondary | ICD-10-CM | POA: Diagnosis not present

## 2022-06-14 DIAGNOSIS — H2511 Age-related nuclear cataract, right eye: Secondary | ICD-10-CM | POA: Diagnosis not present

## 2022-06-27 NOTE — Progress Notes (Signed)
[facility] [address] [place] [address] Phone: [phone] Subjective:   [name], am serving as a scribe for Dr. Antoine Primas.  I'm seeing this patient by the request  of:  Shelva Majestic, MD  CC: Low back pain  VOZ:[id number]  10/03/2021 At L4-L5, there is 7 mm grade 1 anterolisthesis.   Advanced facet arthrosis with ligamentum flavum hypertrophy. Severe right subarticular and central canal stenosis.Marland Kitchen 6. Disc degeneration is greatest at L1-L2 (moderate to moderately advanced), L2-L3 (advanced), L3-L4 (moderate to moderately advanced)and L4-L5 (advanced). Mild degenerative endplate edema at Q5-Z5,G3-O7 and L4-L5. Unfortunately patient has failed all other conservative therapy at this time.  We discussed the severity of the patient's back.  We will increase his Cymbalta to 40 mg but I do think that he should have further evaluation with surgery at this moment.  Patient seems to have some mild increase in weakness of the lower extremities as well.  Patient understands this.  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  Total time going over the MRI and discussing with patient 41 minutes     Update 06/28/2022 Frederich Stellhorn. is a 77 y.o. male coming in with complaint of lumbar spine pain. Referred to [facility] at last visit. Last epidural 07/12/2021. Patient states back pain is about the same. Here to update and figure out next steps  MRI lumbar 05/19/2021 IMPRESSION: 1. In correlating with the prior lumbar spine radiographs of 02/09/2021, ribs may be hypoplastic or absent at the T12 level. Careful attention to spinal numbering is recommended prior to any surgical intervention. 2. Lumbar spondylosis, as outlined and with findings most notably as follows. 3. At L4-L5, there is 7 mm grade 1 anterolisthesis. Disc uncovering with disc bulge.  Advanced facet arthrosis with ligamentum flavum hypertrophy. Severe right subarticular and central canal stenosis. Moderate left subarticular stenosis. Bilateral neural foraminal narrowing (severe right, moderate left). 4. No more than mild spinal canal narrowing at the remaining levels. 5. Additional sites of foraminal stenosis, as detailed and greatest bilaterally at L2-L3 (mild-to-moderate) and L3-L4 (moderate). 6. Disc degeneration is greatest at L1-L2 (moderate to moderately advanced), L2-L3 (advanced), L3-L4 (moderate to moderately advanced) and L4-L5 (advanced). Mild degenerative endplate edema at F6-E3, L3-L4 and L4-L5. 7. Lumbar dextrocurvature. 8. Mild grade 1 retrolisthesis at L1-L2, L2-L3 and L3-L4. 9. Suspected cholecystolithiasis    Past Medical History:  Diagnosis Date   Arthritis    Carotid artery occlusion    Complication of anesthesia    slow to wake up after shoulder surgeries   Hyperlipidemia 03/08/2016   LDL 75 pre stroke. Goal <70 so on atorvastatin 20mg  every other day   Hypertension    Peripheral vascular disease (HCC)    Pneumonia    Stroke [facility])    still has decreased sensation in left hand and arm (as of 03/09/16)   Past Surgical History:  Procedure Laterality Date   CAROTID ENDARTERECTOMY     ENDARTERECTOMY Right 03/19/2016   Procedure: ENDARTERECTOMY CAROTID;  Surgeon: Sherren Kerns, MD;  Location: [facility];  Service: Vascular;  Laterality: Right;   PATCH ANGIOPLASTY Right 03/19/2016   Procedure: PATCH ANGIOPLASTY OF RIGHT CAROTID USING HEMASHIELD PLATINUM FINESSE;  Surgeon: Sherren Kerns, MD;  Location: [facility];  Service: Vascular;  Laterality: Right;   SHOULDER SURGERY Bilateral    left- injections intermittent now  Rotator cuff repair on both shoulders, 2 different surgeries   TONSILLECTOMY     TOTAL KNEE ARTHROPLASTY Left 12/14/2013   Procedure: LEFT TOTAL KNEE ARTHROPLASTY;  Surgeon: Loanne Drilling, MD;  Location: [facility];  Service: Orthopedics;   Laterality: Left;   Social History   Socioeconomic History   Marital status: Married    Spouse name: Not on file   Number of children: Not on file   Years of education: Not on file   Highest education level: Not on file  Occupational History   Occupation: Retired  Tobacco Use   Smoking status: Never    Passive exposure: Never   Smokeless tobacco: Never  Substance and Sexual Activity   Alcohol use: No    Comment: former drinker   Drug use: No   Sexual activity: Yes  Other Topics Concern   Not on file  Social History Narrative   Married. 1 son. 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       Hobbies: gym daily- riding exercise bike plus weights/machines, photography- used to do a lot of black and white, gardening   Social Determinants of Health   Financial Resource Strain: Low Risk  (05/05/2022)   Overall Financial Resource Strain (CARDIA)    Difficulty of Paying Living Expenses: Not hard at all  Food Insecurity: No Food Insecurity (05/05/2022)   Hunger Vital Sign    Worried About Running Out of Food in the Last Year: Never true    Ran Out of Food in the Last Year: Never true  Transportation Needs: No Transportation Needs (05/05/2022)   PRAPARE - Administrator, Civil Service (Medical): No    Lack of Transportation (Non-Medical): No  Physical Activity: Insufficiently Active (05/05/2022)   Exercise Vital Sign    Days of Exercise per Week: 2 days    Minutes of Exercise per Session: 70 min  Stress: No Stress Concern Present (05/05/2022)   [employer] of Occupational Health - Occupational Stress Questionnaire    Feeling of Stress : Only a little  Social Connections: Moderately Isolated (05/05/2022)   Social Connection and Isolation Panel [NHANES]    Frequency of Communication with Friends and Family: Once a week    Frequency of Social Gatherings with Friends and Family: Once a week    Attends  Religious Services: Never    Database administrator or Organizations: Yes    Attends Engineer, structural: More than 4 times per year    Marital Status: Married   Allergies  Allergen Reactions   Amoxicillin Rash    Has patient had a PCN reaction causing immediate rash, facial/tongue/throat swelling, SOB or lightheadedness with hypotension:unsure Has patient had a PCN reaction causing severe rash involving mucus membranes or skin necrosis:unsure Has patient had a PCN reaction that required hospitalization No Has patient had a PCN reaction occurring within the last 10 years: No If all of the above answers are "NO", then may proceed with Cephalosporin use.    Family History  Problem Relation Age of Onset   Heart disease Father        in 24s- specifics unclear   Other Mother        passed in late 84s     Current Outpatient Medications (Cardiovascular):    amLODipine (NORVASC) 2.5 MG tablet, TAKE 1 TABLET BY MOUTH EVERY DAY   amLODipine (NORVASC) 5 MG tablet, TAKE 1 TABLET (5  MG TOTAL) BY MOUTH DAILY.   atorvastatin (LIPITOR) 20 MG tablet, TAKE 1 TABLET BY MOUTH EVERY OTHER DAY  Current Outpatient Medications (Respiratory):    albuterol (VENTOLIN HFA) 108 (90 Base) MCG/ACT inhaler, Inhale 2 puffs into the lungs every 6 (six) hours as needed for wheezing or shortness of breath.  Current Outpatient Medications (Analgesics):    acetaminophen (TYLENOL) 500 MG tablet, Take 500-1,000 mg by mouth every 6 (six) hours as needed (for pain.).    aspirin EC 81 MG tablet, Take 81 mg by mouth daily. Swallow whole.   Current Outpatient Medications (Other):    gabapentin (NEURONTIN) 300 MG capsule, Take 1 capsule (300 mg total) by mouth 2 (two) times daily.   Multiple Vitamin (MULTIVITAMIN WITH MINERALS) TABS tablet, Take 1 tablet by mouth daily.   Reviewed prior external information including notes and imaging from  primary care provider As well as notes that were available from care  everywhere and other healthcare systems.  Past medical history, social, surgical and family history all reviewed in electronic medical record.  No pertanent information unless stated regarding to the chief complaint.   Review of Systems:  No headache, visual changes, nausea, vomiting, diarrhea, constipation, dizziness, abdominal pain, skin rash, fevers, chills, night sweats, weight loss, swollen lymph nodes, body aches, joint swelling, chest pain, shortness of breath, mood changes. POSITIVE muscle aches  Objective  Blood pressure 122/82, pulse 60, height 5\' 5"  (1.651 m), weight 137 lb (62.1 kg), SpO2 97 %.   General: No apparent distress alert and oriented x3 mood and affect normal, dressed appropriately.  HEENT: Pupils equal, extraocular movements intact  Respiratory: Patient's speak in full sentences and does not appear short of breath  Cardiovascular: No lower extremity edema, non tender, no erythema  Low back exam does have some loss lordosis noted.  Does have some degenerative scoliosis noted.  Negative straight leg test.  Worsening pain with extension of the back.  4-5 strength of the lower extremity but symmetric    Impression and Recommendations:     The above documentation has been reviewed and is accurate and complete Judi Saa, DO

## 2022-06-28 ENCOUNTER — Ambulatory Visit (INDEPENDENT_AMBULATORY_CARE_PROVIDER_SITE_OTHER): Payer: Medicare Other | Admitting: Family Medicine

## 2022-06-28 ENCOUNTER — Encounter: Payer: Self-pay | Admitting: Family Medicine

## 2022-06-28 VITALS — BP 122/82 | HR 60 | Ht 65.0 in | Wt 137.0 lb

## 2022-06-28 DIAGNOSIS — M5136 Other intervertebral disc degeneration, lumbar region: Secondary | ICD-10-CM | POA: Diagnosis not present

## 2022-06-28 DIAGNOSIS — M5416 Radiculopathy, lumbar region: Secondary | ICD-10-CM | POA: Diagnosis not present

## 2022-06-28 DIAGNOSIS — M47896 Other spondylosis, lumbar region: Secondary | ICD-10-CM | POA: Diagnosis not present

## 2022-06-28 NOTE — Assessment & Plan Note (Signed)
Significant degenerative disc disease of the lumbar spine.  The MRI did show the patient does have advanced degenerative disc disease as well as advanced facet arthritis noted at the L4-L5 area.  We attempted to increase the Cymbalta but patient did not feel like it was making any significant improvement previously.  Has failed formal physical therapy as well as gabapentin.  Discussed with patient we attempted epidurals but I do think we could try a medial branch block at the L4-L5 area with most patient's symptoms being localized.  Pain is affecting daily activities at the moment.  We discussed which activities to do and which ones to avoid.  Follow-up with me again in 6 to 8 weeks.

## 2022-06-28 NOTE — Patient Instructions (Addendum)
Tylenol 500mg  3x a day Medial Branch Block ordered  If you qualify for RFA see me 6 weeks after

## 2022-07-04 ENCOUNTER — Encounter: Payer: Self-pay | Admitting: Family Medicine

## 2022-07-06 ENCOUNTER — Encounter: Payer: Self-pay | Admitting: Family Medicine

## 2022-07-06 ENCOUNTER — Other Ambulatory Visit: Payer: Self-pay | Admitting: Family Medicine

## 2022-07-06 DIAGNOSIS — M5136 Other intervertebral disc degeneration, lumbar region: Secondary | ICD-10-CM

## 2022-07-06 DIAGNOSIS — M47896 Other spondylosis, lumbar region: Secondary | ICD-10-CM

## 2022-07-08 IMAGING — XA Imaging study
2 series · 2 of 2 positions shown · non-contrast
Comparison: none

CLINICAL DATA: Lower back pain, lumbar radiculopathy

[Series 3: ortho standard · 1 of 1 slices shown (1 of 2)]
[im 1/1]
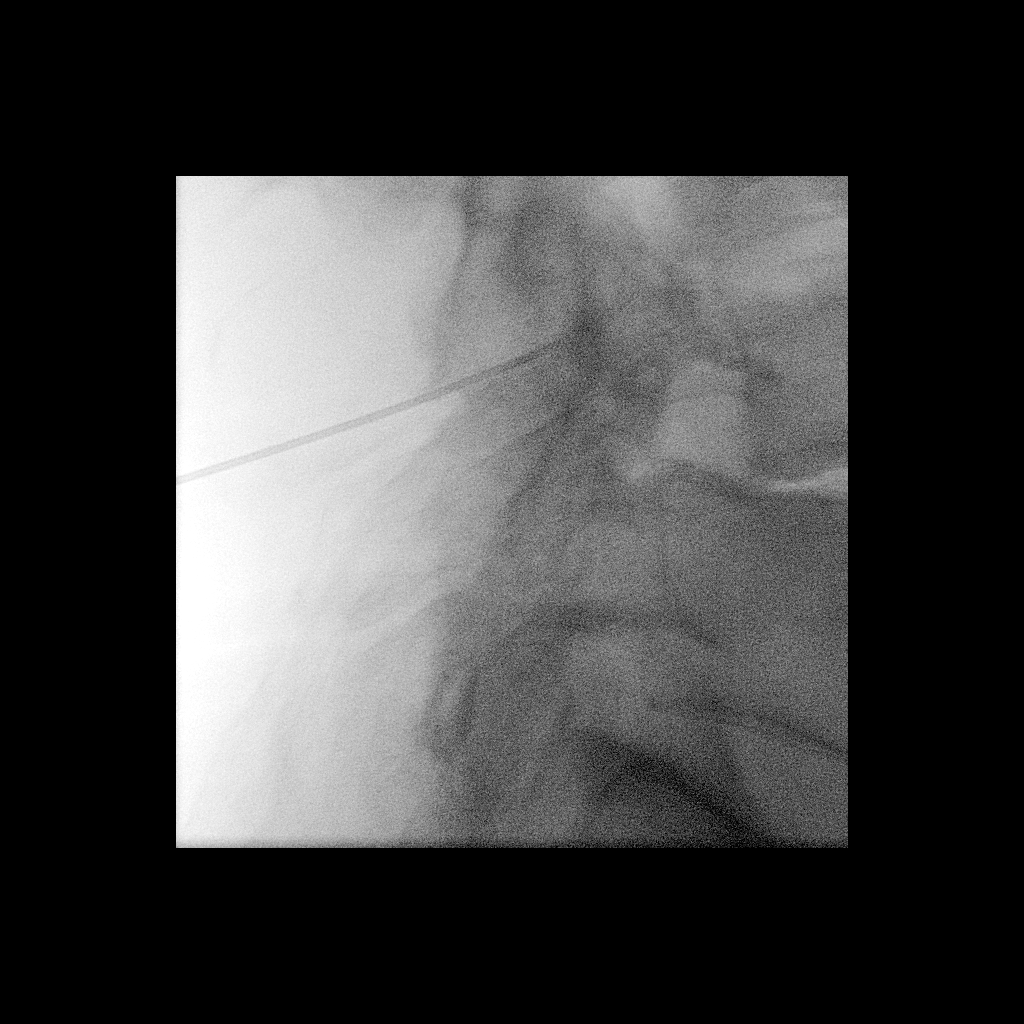

[Series 4: ortho standard · 1 of 1 slices shown (2 of 2)]
[im 1/1]
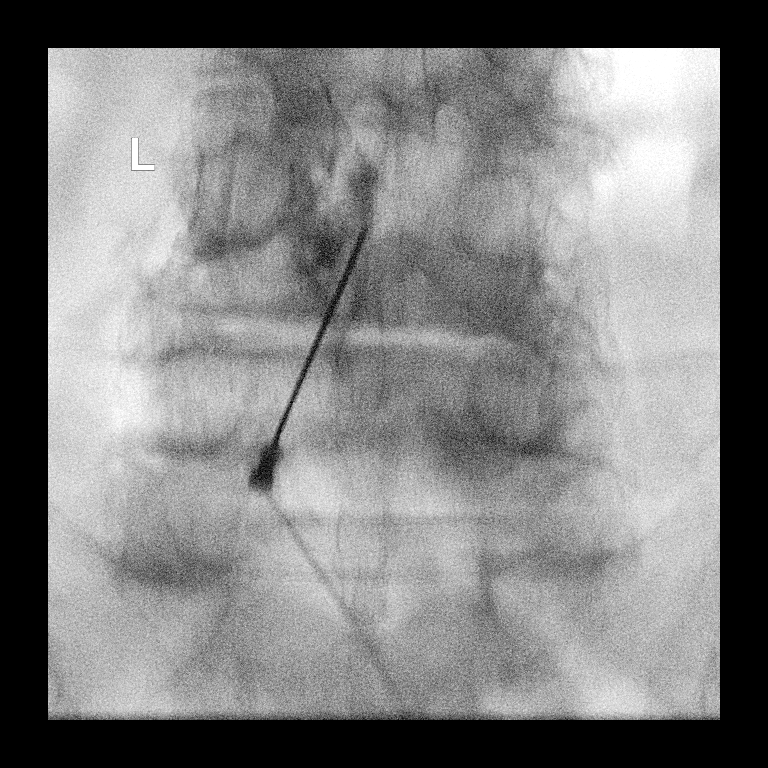

[2 of 2 positions shown; findings below may reference images not displayed]

FLUOROSCOPY:
Radiation Exposure Index (as provided by the fluoroscopic device):
1.8 minutes (7 mGy)

PROCEDURE:
The procedure, risks, benefits, and alternatives were explained to
the patient. Questions regarding the procedure were encouraged and
answered. The patient understands and consents to the procedure.

LUMBAR EPIDURAL INJECTION:

An interlaminar approach was performed on left at L4-L5. The
overlying skin was cleansed and anesthetized. A 20 gauge epidural
needle was advanced using loss-of-resistance technique.

DIAGNOSTIC EPIDURAL INJECTION:

Injection of Isovue-M 200 shows a good epidural pattern with spread
above and below the level of needle placement, primarily on the
left. No vascular opacification is seen.

THERAPEUTIC EPIDURAL INJECTION:

80 Mg of Depo-Medrol mixed with 2 mL 1% lidocaine were instilled.
The procedure was well-tolerated, and the patient was discharged
thirty minutes following the injection in good condition.

COMPLICATIONS:
None immediate
IMPRESSION: Technically successful epidural steroid injection on the left L4-L5
# 1.

## 2022-07-09 ENCOUNTER — Encounter: Payer: Self-pay | Admitting: Family Medicine

## 2022-07-10 NOTE — Telephone Encounter (Signed)
Please see pt msg and concerns and advise 

## 2022-07-23 ENCOUNTER — Encounter: Payer: Self-pay | Admitting: Family Medicine

## 2022-07-26 NOTE — Discharge Instructions (Signed)
Medial Branch Block Discharge Instructions  Take over-the-counter and prescription medicines only as told by your health care provider.  Do not drive the day of your procedure  Return to your normal activities as told by your health care provider.   If injection site is sore you may ice the area for 20 minutes, 2-3 times a day.   Check your injection site every day for signs of infection. Check for: Redness, swelling, or pain. Fluid or blood. Warmth. Pus or a bad smell.  Please contact our office at [phone] if: You have a fever or chills. You have any signs of infection. You develop any numbness or weakness.   Thank you for visiting our office.  

## 2022-07-27 ENCOUNTER — Ambulatory Visit
Admission: RE | Admit: 2022-07-27 | Discharge: 2022-07-27 | Disposition: A | Discharge status: Home / Self Care | Payer: Medicare Other | Source: Ambulatory Visit | Attending: Family Medicine | Admitting: Family Medicine

## 2022-07-27 ENCOUNTER — Other Ambulatory Visit: Payer: Self-pay | Admitting: Family Medicine

## 2022-07-27 DIAGNOSIS — M47896 Other spondylosis, lumbar region: Secondary | ICD-10-CM

## 2022-07-27 DIAGNOSIS — M5136 Other intervertebral disc degeneration, lumbar region: Secondary | ICD-10-CM

## 2022-07-27 DIAGNOSIS — M4316 Spondylolisthesis, lumbar region: Secondary | ICD-10-CM | POA: Diagnosis not present

## 2022-07-27 DIAGNOSIS — M545 Low back pain, unspecified: Secondary | ICD-10-CM | POA: Diagnosis not present

## 2022-07-29 ENCOUNTER — Encounter: Payer: Self-pay | Admitting: Family Medicine

## 2022-07-31 ENCOUNTER — Other Ambulatory Visit: Payer: Self-pay

## 2022-07-31 DIAGNOSIS — M5416 Radiculopathy, lumbar region: Secondary | ICD-10-CM

## 2022-07-31 NOTE — Telephone Encounter (Signed)
Patient called stating he has been feeling foggy for the past couple of weeks. Pt did have a cataract surgery 4-6 weeks ago but unsure if caused this. Denies any other symptoms. Pt has been scheduled with Dr. Ruthine Dose on 6/26 @ 2:30pm. I informed pt that while waiting for either visit or reply from PCP, to call provider who completed cataract surgery for any recommendations. Pt verbalized understanding. Please Advise on any further action.

## 2022-08-01 ENCOUNTER — Encounter: Payer: Self-pay | Admitting: Family Medicine

## 2022-08-01 ENCOUNTER — Ambulatory Visit (INDEPENDENT_AMBULATORY_CARE_PROVIDER_SITE_OTHER): Payer: Medicare Other | Admitting: Family Medicine

## 2022-08-01 ENCOUNTER — Telehealth: Payer: Self-pay | Admitting: Family Medicine

## 2022-08-01 VITALS — BP 118/72 | HR 60 | Temp 98.3°F | Resp 16 | Ht 65.0 in | Wt 135.2 lb

## 2022-08-01 DIAGNOSIS — R531 Weakness: Secondary | ICD-10-CM | POA: Diagnosis not present

## 2022-08-01 DIAGNOSIS — R410 Disorientation, unspecified: Secondary | ICD-10-CM | POA: Diagnosis not present

## 2022-08-01 DIAGNOSIS — R739 Hyperglycemia, unspecified: Secondary | ICD-10-CM | POA: Diagnosis not present

## 2022-08-01 NOTE — Patient Instructions (Signed)

## 2022-08-01 NOTE — Progress Notes (Signed)
Subjective:     Patient ID: Ricky Lor., male    DOB: December 28, 1945, 77 y.o.   MRN: [id number]  Chief Complaint  Patient presents with   Brain Fog    Having fuzzy/foggy feeling for a couple of weeks   Back Pain    HPI  H/o htn,hld,cva, carotid occlusion,PAD-  Fuzzy/foggy feeling for a several weeks-"not clear headed" Cataract surgery 4/30 and 5/9.  Stopped gabapentin about 1 wk ago.  Was up to 300mg  tid.  Getting worse and more consistent. Not dizzy.  Still adjusting to new lenses and glasses. Not confused.   Bp running lower now.  102-130/70-80 but since 6/13-90-110's/70-80.  P 55-60's.  No dizziness upon standing.   Has lost some strength and muscle mass past 1 yr.  Wt down 4# since April.  Losing strength.   Has fallen twice(chronic back pain-stenosis). -dog and other time missed a step.   No cp/sob/v/d.  No ha.   Tired a lot.  Had carotids checked Jan 2024.   Chronic back-just had blocks.    There are no preventive care reminders to display for this patient.  Past Medical History:  Diagnosis Date   Arthritis    Carotid artery occlusion    Complication of anesthesia    slow to wake up after shoulder surgeries   Hyperlipidemia 03/08/2016   LDL 75 pre stroke. Goal <70 so on atorvastatin 20mg  every other day   Hypertension    Peripheral vascular disease (HCC)    Pneumonia    Stroke [facility])    still has decreased sensation in left hand and arm (as of 03/09/16)    Past Surgical History:  Procedure Laterality Date   CAROTID ENDARTERECTOMY     CATARACT EXTRACTION W/ INTRAOCULAR LENS IMPLANT Bilateral 2024   ENDARTERECTOMY Right 03/19/2016   Procedure: ENDARTERECTOMY CAROTID;  Surgeon: Sherren Kerns, MD;  Location: [facility] OR;  Service: Vascular;  Laterality: Right;   PATCH ANGIOPLASTY Right 03/19/2016   Procedure: PATCH ANGIOPLASTY OF RIGHT CAROTID USING HEMASHIELD PLATINUM FINESSE;  Surgeon: Sherren Kerns, MD;  Location: [facility] OR;  Service: Vascular;  Laterality: Right;    SHOULDER SURGERY Bilateral    left- injections intermittent now  Rotator cuff repair on both shoulders, 2 different surgeries   TONSILLECTOMY     TOTAL KNEE ARTHROPLASTY Left 12/14/2013   Procedure: LEFT TOTAL KNEE ARTHROPLASTY;  Surgeon: Loanne Drilling, MD;  Location: [facility] ORS;  Service: Orthopedics;  Laterality: Left;     Current Outpatient Medications:    acetaminophen (TYLENOL) 500 MG tablet, Take 500-1,000 mg by mouth every 6 (six) hours as needed (for pain.). , Disp: , Rfl:    albuterol (VENTOLIN HFA) 108 (90 Base) MCG/ACT inhaler, Inhale 2 puffs into the lungs every 6 (six) hours as needed for wheezing or shortness of breath., Disp: 1 each, Rfl: 0   amLODipine (NORVASC) 2.5 MG tablet, TAKE 1 TABLET BY MOUTH EVERY DAY, Disp: 90 tablet, Rfl: 1   amLODipine (NORVASC) 5 MG tablet, TAKE 1 TABLET (5 MG TOTAL) BY MOUTH DAILY., Disp: 90 tablet, Rfl: 3   aspirin EC 81 MG tablet, Take 81 mg by mouth daily. Swallow whole., Disp: , Rfl:    atorvastatin (LIPITOR) 20 MG tablet, TAKE 1 TABLET BY MOUTH EVERY OTHER DAY, Disp: 45 tablet, Rfl: 2   Multiple Vitamin (MULTIVITAMIN WITH MINERALS) TABS tablet, Take 1 tablet by mouth daily., Disp: , Rfl:    gabapentin (NEURONTIN) 300 MG capsule, Take 1 capsule (300 mg  total) by mouth 2 (two) times daily. (Patient not taking: Reported on 08/01/2022), Disp: 60 capsule, Rfl: 2  Allergies  Allergen Reactions   Amoxicillin Rash    Has patient had a PCN reaction causing immediate rash, facial/tongue/throat swelling, SOB or lightheadedness with hypotension:unsure Has patient had a PCN reaction causing severe rash involving mucus membranes or skin necrosis:unsure Has patient had a PCN reaction that required hospitalization No Has patient had a PCN reaction occurring within the last 10 years: No If all of the above answers are "NO", then may proceed with Cephalosporin use.    ROS neg/noncontributory except as noted HPI/below      Objective:     BP 118/72 (BP  Location: Left Arm, Patient Position: Standing, Cuff Size: Normal)   Pulse 60   Temp 98.3 F (36.8 C) (Temporal)   Resp 16   Ht 5\' 5"  (1.651 m)   Wt 135 lb 4 oz (61.3 kg)   SpO2 96%   BMI 22.51 kg/m  Wt Readings from Last 3 Encounters:  08/01/22 135 lb 4 oz (61.3 kg)  06/28/22 137 lb (62.1 kg)  05/08/22 139 lb 6.4 oz (63.2 kg)    Physical Exam   Gen: WDWN NAD HEENT: NCAT, conjunctiva not injected, sclera nonicteric NECK:  supple, no thyromegaly, no nodes, no carotid bruits CARDIAC: RRR, S1S2+, no murmur.  LUNGS: CTAB. No wheezes ABDOMEN:  BS+, soft, NTND, No HSM, no masses EXT:  no edema MSK: no gross abnormalities.  MS 5/5 all 4 NEURO: A&O x3.  CN II-XII intact.  PSYCH: normal mood. Good eye contact  Reviewed chart, pt bp log     Assessment & Plan:  Weakness -     CBC with Differential/Platelet -     TSH -     Vitamin B12 -     Sedimentation rate -     Comprehensive metabolic panel -     Hemoglobin A1c -     C-reactive protein  Hyperglycemia -     Hemoglobin A1c  Confusion -     CBC with Differential/Platelet -     TSH -     Vitamin B12 -     Sedimentation rate -     Comprehensive metabolic panel   Weakness-not sure if lower bp's, PMR, chronic pain, meds, gabapentin, other.  Monitor bp's after sitting 5 minutes. Check labs.  Just stopped gabapentin so give more time.  Consider decreasing amlodipine.  Pt going to beach in few days.  Check labs.  Worse, new symptoms, let us know or ER.  Hyperglycemia-may be having some abnormal sugars.  Check A1C. Confusion-more "fuzzy/foggy" headed.  Check labs.  ? From gabapentin and then withdrawal.  Carotid dopplers ok in January.    Return if symptoms worsen or fail to improve.  Angelena Sole, MD

## 2022-08-01 NOTE — Telephone Encounter (Signed)
Patient dropped off document Handicap Placard, to be filled out by provider. Patient requested to send it back via Call Patient to pick up within ASAP. Document is located in providers tray at front office.Please advise at Mobile [phone] (mobile)

## 2022-08-02 LAB — COMPREHENSIVE METABOLIC PANEL
ALT: 18 U/L (ref 0–53)
AST: 24 U/L (ref 0–37)
Albumin: 3.8 g/dL (ref 3.5–5.2)
Alkaline Phosphatase: 57 U/L (ref 39–117)
BUN: 21 mg/dL (ref 6–23)
CO2: 26 mEq/L (ref 19–32)
Calcium: 9 mg/dL (ref 8.4–10.5)
Chloride: 108 mEq/L (ref 96–112)
Creatinine, Ser: 0.76 mg/dL (ref 0.40–1.50)
GFR: 85.99 mL/min (ref >60.00)
Glucose, Bld: 110 mg/dL — ABNORMAL HIGH (ref 70–99)
Potassium: 4.2 mEq/L (ref 3.5–5.1)
Sodium: 142 mEq/L (ref 135–145)
Total Bilirubin: 0.4 mg/dL (ref 0.2–1.2)
Total Protein: 6 g/dL (ref 6.0–8.3)

## 2022-08-02 LAB — SEDIMENTATION RATE: Sed Rate: 7 mm/hr (ref 0–20)

## 2022-08-02 LAB — CBC WITH DIFFERENTIAL/PLATELET
Basophils Absolute: 0.1 10*3/uL (ref 0.0–0.1)
Basophils Relative: 1.2 % (ref 0.0–3.0)
Eosinophils Absolute: 0.1 10*3/uL (ref 0.0–0.7)
Eosinophils Relative: 2 % (ref 0.0–5.0)
HCT: 38.7 % — ABNORMAL LOW (ref 39.0–52.0)
Hemoglobin: 12.8 g/dL — ABNORMAL LOW (ref 13.0–17.0)
Lymphocytes Relative: 25 % (ref 12.0–46.0)
Lymphs Abs: 1.1 10*3/uL (ref 0.7–4.0)
MCHC: 33 g/dL (ref 30.0–36.0)
MCV: 93.8 fl (ref 78.0–100.0)
Monocytes Absolute: 0.4 10*3/uL (ref 0.1–1.0)
Monocytes Relative: 8.3 % (ref 3.0–12.0)
Neutro Abs: 2.9 10*3/uL (ref 1.4–7.7)
Neutrophils Relative %: 63.5 % (ref 43.0–77.0)
Platelets: 254 10*3/uL (ref 150.0–400.0)
RBC: 4.13 Mil/uL — ABNORMAL LOW (ref 4.22–5.81)
RDW: 14.8 % (ref 11.5–15.5)
WBC: 4.6 10*3/uL (ref 4.0–10.5)

## 2022-08-02 LAB — VITAMIN B12: Vitamin B-12: 424 pg/mL (ref 211–911)

## 2022-08-02 LAB — HEMOGLOBIN A1C: Hgb A1c MFr Bld: 5.9 % (ref 4.6–6.5)

## 2022-08-02 LAB — TSH: TSH: 2.45 u[IU]/mL (ref 0.35–5.50)

## 2022-08-02 LAB — C-REACTIVE PROTEIN: CRP: <1 mg/dL (ref 0.5–20.0)

## 2022-08-02 NOTE — Progress Notes (Signed)
Labs look great except slight anemia-any dark stools, bleeding? Hopefully, all from gabapentin.  See what the next week or so does He does have appt with Dr. Durene Cal next month so will be able to f/u on anemia

## 2022-08-02 NOTE — Telephone Encounter (Signed)
Called and made pt aware that handicap placard  is complete and ready for pickup up front.

## 2022-08-16 ENCOUNTER — Ambulatory Visit
Admission: RE | Admit: 2022-08-16 | Discharge: 2022-08-16 | Disposition: A | Discharge status: Home / Self Care | Payer: Medicare Other | Source: Ambulatory Visit | Attending: Family Medicine | Admitting: Family Medicine

## 2022-08-16 DIAGNOSIS — M48061 Spinal stenosis, lumbar region without neurogenic claudication: Secondary | ICD-10-CM | POA: Diagnosis not present

## 2022-08-16 DIAGNOSIS — M4316 Spondylolisthesis, lumbar region: Secondary | ICD-10-CM | POA: Diagnosis not present

## 2022-08-16 DIAGNOSIS — M5416 Radiculopathy, lumbar region: Secondary | ICD-10-CM

## 2022-08-16 DIAGNOSIS — M47816 Spondylosis without myelopathy or radiculopathy, lumbar region: Secondary | ICD-10-CM | POA: Diagnosis not present

## 2022-08-16 DIAGNOSIS — M47817 Spondylosis without myelopathy or radiculopathy, lumbosacral region: Secondary | ICD-10-CM | POA: Diagnosis not present

## 2022-08-16 MED ORDER — METHYLPREDNISOLONE ACETATE 40 MG/ML INJ SUSP (RADIOLOG
80.0000 mg | Freq: Once | INTRAMUSCULAR | Status: AC
  Administered 2022-08-16: 80 mg via EPIDURAL

## 2022-08-16 MED ORDER — IOPAMIDOL (ISOVUE-M 200) INJECTION 41%
1.0000 mL | Freq: Once | INTRAMUSCULAR | Status: AC
  Administered 2022-08-16: 1 mL via EPIDURAL

## 2022-08-16 NOTE — Discharge Instructions (Signed)

## 2022-08-17 ENCOUNTER — Encounter: Payer: Self-pay | Admitting: Family Medicine

## 2022-08-28 ENCOUNTER — Ambulatory Visit: Payer: Medicare Other | Admitting: Family Medicine

## 2022-08-28 ENCOUNTER — Encounter: Payer: Self-pay | Admitting: Family Medicine

## 2022-08-28 VITALS — BP 125/76 | HR 58 | Temp 98.0°F | Ht 65.0 in | Wt 135.0 lb

## 2022-08-28 DIAGNOSIS — R739 Hyperglycemia, unspecified: Secondary | ICD-10-CM

## 2022-08-28 DIAGNOSIS — E785 Hyperlipidemia, unspecified: Secondary | ICD-10-CM | POA: Diagnosis not present

## 2022-08-28 DIAGNOSIS — I10 Essential (primary) hypertension: Secondary | ICD-10-CM | POA: Diagnosis not present

## 2022-08-28 MED ORDER — AMLODIPINE BESYLATE 2.5 MG PO TABS: 2.5000 mg | ORAL_TABLET | Freq: Every day | ORAL | 3 refills | Status: DC

## 2022-08-28 NOTE — Progress Notes (Addendum)
Phone [phone] In person visit   Subjective:   Ricky [name]. is a 77 y.o. year old very pleasant male patient who presents for/with See problem oriented charting Chief Complaint  Patient presents with   Medical Management of Chronic Issues   Hypertension   Hyperlipidemia   brain fog    Wants to discuss 'fuzzy brain or brain fog"   muscle mass    Pt c/o muscle mass decrease and strength   Back Pain    Pt c/o back pain not getting better and becoming more chronic     Past Medical History-  Patient Active Problem List   Diagnosis Date Noted   Hemiparesis affecting dominant side as late effect of stroke (HCC) 03/08/2016    Priority: High   History of CVA (cerebrovascular accident) 02/28/2016    Priority: High   Emphysema lung (HCC) 08/25/2021    Priority: Medium    Hyperglycemia 08/25/2021    Priority: Medium    Carotid artery stenosis, symptomatic, right 03/19/2016    Priority: Medium    Hyperlipidemia 03/08/2016    Priority: Medium    Essential hypertension 12/20/2014    Priority: Medium    Senile purpura (HCC) 08/25/2021    Priority: Low   Degenerative disc disease, lumbar 02/09/2021    Priority: Low   Acquired leg length discrepancy 02/09/2021    Priority: Low   Leukopenia 01/08/2017    Priority: Low   Trigger middle finger of left hand 11/12/2016    Priority: Low   OA (osteoarthritis) of knee 12/14/2013    Priority: Low    Medications- reviewed and updated Current Outpatient Medications  Medication Sig Dispense Refill   acetaminophen (TYLENOL) 500 MG tablet Take 500-1,000 mg by mouth every 6 (six) hours as needed (for pain.).      albuterol (VENTOLIN HFA) 108 (90 Base) MCG/ACT inhaler Inhale 2 puffs into the lungs every 6 (six) hours as needed for wheezing or shortness of breath. 1 each 0   amLODipine (NORVASC) 2.5 MG tablet Take 1-2 tablets (2.5-5 mg total) by mouth daily. If blood pressure is above 145 on repeat - can take a 2nd dose 135 tablet 3    aspirin EC 81 MG tablet Take 81 mg by mouth daily. Swallow whole.     atorvastatin (LIPITOR) 20 MG tablet TAKE 1 TABLET BY MOUTH EVERY OTHER DAY 45 tablet 2   Multiple Vitamin (MULTIVITAMIN WITH MINERALS) TABS tablet Take 1 tablet by mouth daily.     No current facility-administered medications for this visit.     Objective:  BP 125/76 Comment: most recent home reading  Pulse (!) 58   Temp 98 F (36.7 C)   Ht 5\' 5"  (1.651 m)   Wt 135 lb (61.2 kg)   SpO2 98%   BMI 22.47 kg/m  Gen: NAD, resting comfortably CV: RRR no murmurs rubs or gallops Lungs: CTAB no crackles, wheeze, rhonchi Ext: no edema Skin: warm, dry    Assessment and Plan   # generalized Weakness/brain fog S:patient saw Dr. Ruthine Dose 08/01/22 and was complaining of muscle weakness- thought related to lower blood pressure possibly and amlodipine reduced to 5 mg from 7.5 mg (after visit). He had stopped gabapentin to see if that would help. Labs including CBC (mild intermittent anemia for years and no worse), TSH, B12, sed rate, CMP, a1c, crp largely reassuring. These labs had overlap for reported "brain fog" or "fuzzy" headed feeling.   Has noted improvement in symptoms with cutting dose- wife  states less sleepy, less brain fog.  A/P: both issues appear to be improving with letting blood pressure raise some- see hypertension section but believe we need to let this raise further- want to avoid any pressures under 105 at least but want to give him flexibility if blood pressure goes high   - has lost muscle mass over the years- mild weight loss- discussed myfitness pal and gain 0.5 pounds per week with goal getting back to 140 from 135 - prior stroke likely affects strength as well -reassuring labs last visit - since he improved before we optedt o make adjustments- if doesn't improve further could get neurology opinion on brain fog and weaknes/muscle mass loss over time but I suspect it is from factors (aging, prior stroke,  inadequate protein)  #hypertension S: medication: Amlodipine 7.5 mg--> 5 mg after BP dropped below 100 at times Home readings #s: has home cuff - rcently even with reduction he has had several readings under 100 systolic. Readings can also get as high as 144 but improves on repeat usually under 130  BP Readings from Last 3 Encounters:  08/28/22 125/76  08/16/22 (!) 155/75  08/01/22 118/72  A/P: blood pressure is overcontrolled at home at times and could be contributing of the symptoms- reduce amlodipine to 2.5 mg but you have flexibility if blood pressure goes above 145 with rest and stays there to take a second dose. Update me in 2 weeks with home readings.  Blood pressure running higher can increase stroke risk but we also want to avoid the symptoms he is having that seem to be associated with lower blood pressure  - of note carotid duplex stable/low riks 1-39% stenosis March 05, 2022  # Hyperglycemia/insulin resistance/prediabetes- peak a1c 103 08/03/19.  Peak A1c 5.9 February 17, 2020 S:  Medication:none  Lab Results  Component Value Date   HGBA1C 5.9 08/01/2022   HGBA1C 5.9 02/26/2022   HGBA1C 5.9 08/25/2021  A/P: stable-  continue to monitor   # Degenerative disc disease/lumbar stenosis-working with Dr. Katrinka Blazing with last visit 10/03/2021-Cymbalta was increased to 40 mg and he was referred to neurosurgery Dr. Jake Samples - he came off cymbalta and gabapentin 300mg  twice a day (possibly affected mental fog) -ongoing pain issues are barrier to mobility and activity.  -has been taking some ibuprofen- advised against- was using in combo pain - dry needling once didn't help and has had multiple injections at this point without relief. Medial branch block didn't seem to help.   Recommended follow up: Return in about 2 months (around 10/29/2022) for followup or sooner if needed.Schedule b4 you leave. Future Appointments  Date Time Provider Department Center  05/14/2023  9:00 AM [facility] ANNUAL  WELLNESS VISIT 1 [facility]   Lab/Order associations:   ICD-10-CM   1. Essential hypertension  I10     2. Hyperlipidemia, unspecified hyperlipidemia type  E78.5     3. Hyperglycemia  R73.9      Meds ordered this encounter  Medications   amLODipine (NORVASC) 2.5 MG tablet    Sig: Take 1-2 tablets (2.5-5 mg total) by mouth daily. If blood pressure is above 145 on repeat - can take a 2nd dose    Dispense:  135 tablet    Refill:  3   Return precautions advised.  Tana Conch, MD

## 2022-08-28 NOTE — Patient Instructions (Addendum)
Let us know if you get any COVID vaccines this fall.  blood pressure is overcontrolled at home at times and could be contributing of the symptoms- reduce amlodipine to 2.5 mg but you have flexibility if blood pressure goes above 145 with rest and stays there to take a second dose. Update me in 2 weeks with home readings.    Recommended follow up: Return in about 2 months (around 10/29/2022) for followup or sooner if needed.Schedule b4 you leave.  See [place] sooner if needed

## 2022-08-31 ENCOUNTER — Encounter: Payer: Self-pay | Admitting: Family Medicine

## 2022-08-31 ENCOUNTER — Other Ambulatory Visit: Payer: Self-pay

## 2022-08-31 DIAGNOSIS — M5136 Other intervertebral disc degeneration, lumbar region: Secondary | ICD-10-CM

## 2022-08-31 NOTE — Telephone Encounter (Signed)
Referrals placed to PT and Pain Management.

## 2022-09-05 ENCOUNTER — Encounter: Payer: Self-pay | Admitting: Family Medicine

## 2022-09-11 ENCOUNTER — Encounter: Payer: Self-pay | Admitting: Family Medicine

## 2022-09-11 ENCOUNTER — Telehealth: Payer: Self-pay | Admitting: Family Medicine

## 2022-09-11 NOTE — Telephone Encounter (Signed)
Patient dropped off list of recent blood pressure readings . List placed in providers folder for review .

## 2022-09-11 NOTE — Telephone Encounter (Signed)
In your review folder. 

## 2022-09-11 NOTE — Telephone Encounter (Signed)
I am going to respond to his MyChart message

## 2022-09-17 ENCOUNTER — Ambulatory Visit: Payer: Medicare Other | Attending: Family Medicine | Admitting: Physical Therapy

## 2022-09-17 ENCOUNTER — Encounter: Payer: Self-pay | Admitting: Physical Therapy

## 2022-09-17 VITALS — BP 173/85 | HR 55

## 2022-09-17 DIAGNOSIS — M5459 Other low back pain: Secondary | ICD-10-CM | POA: Insufficient documentation

## 2022-09-17 DIAGNOSIS — M5136 Other intervertebral disc degeneration, lumbar region: Secondary | ICD-10-CM | POA: Diagnosis not present

## 2022-09-17 DIAGNOSIS — M6281 Muscle weakness (generalized): Secondary | ICD-10-CM | POA: Diagnosis not present

## 2022-09-17 NOTE — Patient Instructions (Signed)
  Aquatic Therapy: What to Expect!  Where:  [facility] [address] [place], [place]  [address] [phone]  NOTE:  You will receive an automated phone message reminding you of your appointment and it will say the appointment is at the Rehab [facility] on [address]  We are working to fix this- just know that you will meet us at the pool!  How to Prepare: Please make sure you drink 8 ounces of water about one hour prior to your pool session A caregiver MUST attend the entire session with the patient.  The caregiver will be responsible for assisting with dressing as well as any toileting needs.  If the patient will be doing a home program this should likely be the person who will assist as well.  Patients must wear either their street shoes or pool shoes until they are ready to enter the pool with the therapist.  Patients must also wear either street shoes or pool shoes once exiting the pool to walk to the locker room.  This will helps us prevent slips and falls.  Please arrive 15 minutes early to prepare for your pool therapy session Sign in at the front desk on the clipboard marked for [facility] You may use the locker rooms on your right and then enter directly into the recreation pool (NOT the competition pool) Please make sure to attend to any toileting needs prior to entering the pool Please be dressed in your swim suit and on the pool deck at least 5 minutes before your appointment Once on the pool deck your therapist will ask you to sign the Patient  Consent and Assignment of Benefits form Your therapist may take your blood pressure prior to, during and after your session if indicated  About the pool  and parking: Entering the pool Your therapist will assist you; there are 2 ways to enter:  stairs with railings or with a chair lift.   Your therapist will determine the most appropriate way for you. Water temperature is usually between 86-87 degrees There  may be other swimmers in the pool at the same time Parking is free.   Contact Info:     Appointments: [facility]  All sessions are 45 minutes   [address]  [address]     Please call the [facility] if   [place], [place]   [address]    you need to cancel or reschedule an appointment.  [phone]       

## 2022-09-17 NOTE — Therapy (Signed)
OUTPATIENT PHYSICAL THERAPY NEURO EVALUATION   Patient Name: Ricky [name]. MRN: [id number] DOB:February 14, 1945, 77 y.o., male Today's Date: 09/17/2022   PCP: Shelva Majestic., MD REFERRING PROVIDER: Judi Saa, DO  END OF SESSION:  PT End of Session - 09/17/22 1942     Visit Number 1    Number of Visits 9    Date for PT Re-Evaluation 11/30/22   pushed out due to schedule availability   Authorization Type Medicare/BCBS    Authorization Time Period 09-17-22 - 12-06-22    PT Start Time 0847    PT Stop Time 0933    PT Time Calculation (min) 46 min    Activity Tolerance Patient tolerated treatment well    Behavior During Therapy [facility] for tasks assessed/performed             Past Medical History:  Diagnosis Date   Arthritis    Carotid artery occlusion    Complication of anesthesia    slow to wake up after shoulder surgeries   Hyperlipidemia 03/08/2016   LDL 75 pre stroke. Goal <70 so on atorvastatin 20mg  every other day   Hypertension    Peripheral vascular disease (HCC)    Pneumonia    Stroke [facility])    still has decreased sensation in left hand and arm (as of 03/09/16)   Past Surgical History:  Procedure Laterality Date   CAROTID ENDARTERECTOMY     CATARACT EXTRACTION W/ INTRAOCULAR LENS IMPLANT Bilateral 2024   ENDARTERECTOMY Right 03/19/2016   Procedure: ENDARTERECTOMY CAROTID;  Surgeon: Sherren Kerns, MD;  Location: [facility] OR;  Service: Vascular;  Laterality: Right;   PATCH ANGIOPLASTY Right 03/19/2016   Procedure: PATCH ANGIOPLASTY OF RIGHT CAROTID USING HEMASHIELD PLATINUM FINESSE;  Surgeon: Sherren Kerns, MD;  Location: [facility] OR;  Service: Vascular;  Laterality: Right;   SHOULDER SURGERY Bilateral    left- injections intermittent now  Rotator cuff repair on both shoulders, 2 different surgeries   TONSILLECTOMY     TOTAL KNEE ARTHROPLASTY Left 12/14/2013   Procedure: LEFT TOTAL KNEE ARTHROPLASTY;  Surgeon: Loanne Drilling, MD;  Location: [facility] ORS;  Service:  Orthopedics;  Laterality: Left;   Patient Active Problem List   Diagnosis Date Noted   Emphysema lung (HCC) 08/25/2021   Hyperglycemia 08/25/2021   Senile purpura (HCC) 08/25/2021   Degenerative disc disease, lumbar 02/09/2021   Acquired leg length discrepancy 02/09/2021   Leukopenia 01/08/2017   Trigger middle finger of left hand 11/12/2016   Carotid artery stenosis, symptomatic, right 03/19/2016   Hemiparesis affecting dominant side as late effect of stroke (HCC) 03/08/2016   Hyperlipidemia 03/08/2016   History of CVA (cerebrovascular accident) 02/28/2016   Essential hypertension 12/20/2014   OA (osteoarthritis) of knee 12/14/2013    ONSET DATE: Referral date   REFERRING DIAG: M51.36 (ICD-10-CM) - DDD (degenerative disc disease), lumbar  THERAPY DIAG:  Other low back pain  Muscle weakness (generalized)  Rationale for Evaluation and Treatment: Rehabilitation  SUBJECTIVE:  SUBJECTIVE STATEMENT:       Pt referred for aquatic PT due to back pain (due to lumbar DDD)  Pt states he started water walking at [employer] - pool is 98m in length - does a 30" workout - started about 3-4 weeks ago; gets in pool on M-Wed- Friday - doing a combination of land and pool exercises.  Pt feels like he has lost muscle mass with aging; states he feels minimal back pain with use of cart in store. Pt reports he has had 2 epidurals (most recent one was 08-16-22) and 2 nerve blocks (1 session but done on both sides); pt states it doesn't take much to get pain with walking with carrying item. Pt states he has some walking sticks but didn't use them that much. Did PT at [facility] approx. 8-9 months ago (can't remember why he stopped going).  Pt reports he had 1 session of dry needling to his low back but doesn't feel it  helped.   Pt accompanied by: self  PERTINENT HISTORY: Lt TKA 12/2013;  CVA Feb. 2018 with LUE residual sensation deficits:  HTN: PVD:  Emphysema lung   PAIN:    Are you having pain? Yes: NPRS scale: 3/10 Pain description: throbbing Aggravating factors: strenuous activity - like yard work or housework  Relieving factors: sitting or lying down helps to relieve  Location:  Low back/lumbar - pt reports slightly more pain on Rt side than on Lt side of low back   PRECAUTIONS: None  RED FLAGS: None   WEIGHT BEARING RESTRICTIONS: No  FALLS: Has patient fallen in last 6 months? No  LIVING ENVIRONMENT: Lives with: lives with their spouse Lives in: House/apartment Stairs: Yes: Internal: 12 steps; on right going up Has following equipment at home: None  PLOF: Independent  PATIENT GOALS: learn aquatic exercises to do independently  OBJECTIVE:   DIAGNOSTIC FINDINGS: None recently  COGNITION: Overall cognitive status: Within functional limits for tasks assessed   SENSATION: Impaired LUE due to residual effect of CVA in 2018  COORDINATION: WNL's  POSTURE:  increased forward flexion as back pain increases - pt reports pain is less when he leans forward  LOWER EXTREMITY ROM:   WNL's bil. LE's   LOWER EXTREMITY MMT:    MMT Right Eval Left Eval  Hip flexion 4 5  Hip extension    Hip abduction    Hip adduction    Hip internal rotation    Hip external rotation    Knee flexion 4 4+  Knee extension 5 5  Ankle dorsiflexion 4- 5  Ankle plantarflexion    Ankle inversion    Ankle eversion    (Blank rows = not tested)  BED MOBILITY:  Independent  TRANSFERS: Assistive device utilized: None  Sit to stand: Complete Independence Stand to sit: Complete Independence  STAIRS: Level of Assistance: Modified independence Stair Negotiation Technique: Alternating Pattern  with Single Rail on Right Number of Stairs: 4  Height of Stairs: 6  Comments: step over step  sequence  GAIT: Gait pattern:  increased Rt foot slap in stance due to weak eccentric dorsiflexors and step through pattern Distance walked: 115' Assistive device utilized: None Level of assistance: Modified independence Comments: increased forward flexed posture as back pain increases  FUNCTIONAL TESTS:  5 times sit to stand: 8.40 secs without UE support from standard chair Timed up and go (TUG): 10.82 secs 10 meter walk test: 12.72 ft = 2.58 ft/sec  PATIENT SURVEYS:  N/A - dx  is back pain  TODAY'S TREATMENT:                                                                                                                              DATE: 09-17-22    PATIENT EDUCATION: Education details: eval results with information on aquatic therapy provided Person educated: Patient Education method: Explanation Education comprehension: verbalized understanding  HOME EXERCISE PROGRAM: Aquatic HEP to be established  GOALS: Goals reviewed with patient? Yes  SHORT TERM GOALS: Target date: 10-26-22  Pt will participate in aquatic PT session with c/o LBP </= 2/10 at end of session. Baseline: Goal status: INITIAL  2.  Pt will amb.10" nonstop in pool in various directions and maintain upright posture at least 75% time, I.e. at least 8". Baseline:  Goal status: INITIAL  3.  Pt will subjectively report increased relief in low back pain by experiencing longer duration prior to onset of back pain. Baseline:  Goal status: INITIAL  4.  Pt will be independent in basic aquatic HEP. Baseline:  Goal status: INITIAL    LONG TERM GOALS: Target date: 11-23-22  Pt will participate in aquatic PT session with c/o LBP </= 1/10 at end of session. Baseline:  Goal status: INITIAL  2.  Pt will subjectively report improvement in low back pain and functional status by reporting ability to carry a 5# object in his home at least 20' without experiencing back pain. Baseline:  Goal status: INITIAL  3.   Pt will negotiate steps in pool area with use of handrails using step over step sequence with supervision without report of increased back pain. Baseline:  Goal status: INITIAL  4.  Independent in updated aquatic HEP to include Ai Chi postures. Baseline:  Goal status: INITIAL   ASSESSMENT:  CLINICAL IMPRESSION: Patient is a 77 y.o. gentleman who was seen today for physical therapy evaluation and treatment for lumbar DDD for aquatic therapy.  Pt presents with c/o low back pain intensity rating at start of session at 3/10 intensity.  Pt is independent with ambulation with no device, but does demonstrate postural abnormality of increased trunk flexion with prolonged ambulation due to increased back pain.  Bil. LE strength is WNL's except for Rt dorsiflexors which are 4-/5.  Balance is WNL's with pt's TUG score = 10.82 secs, not indicative of fall risk.  Pt's 5x sit to stand score = 8.4 secs without UE support from standard chair.  Pt reports he had epidural injection on 08-16-22 but has had minimal relief with epidurals and nerve blocks in the past.  Pt will benefit from aquatic therapy for LE and core strengthening with benefit of buoyancy providing offloading of joints, spinal decompression and unweighting for pain reduction with weight bearing exercises in aquatic environment.    OBJECTIVE IMPAIRMENTS: difficulty walking, decreased strength, and pain.   ACTIVITY LIMITATIONS: carrying, bending, standing, squatting, stairs, and locomotion level  PARTICIPATION LIMITATIONS: cleaning, shopping, community activity, and yard work  PERSONAL FACTORS: Age, Past/current experiences, Time since onset of injury/illness/exacerbation, and 1 comorbidity: h/o lumbar DDD  are also affecting patient's functional outcome.   REHAB POTENTIAL: Good  CLINICAL DECISION MAKING: Stable/uncomplicated  EVALUATION COMPLEXITY: Low  PLAN:  PT FREQUENCY: 1x/week  PT DURATION: 8 weeks + eval = 9 visits  PLANNED  INTERVENTIONS: Therapeutic exercises, Therapeutic activity, Neuromuscular re-education, Balance training, Gait training, Patient/Family education, Self Care, and Aquatic Therapy  PLAN FOR NEXT SESSION: begin aquatic PT for back pain management - LE strengthening and core strengthening    , Donavan Burnet, PT 09/17/2022, 7:49 PM

## 2022-09-19 ENCOUNTER — Encounter: Payer: Self-pay | Admitting: Family Medicine

## 2022-09-20 ENCOUNTER — Encounter (INDEPENDENT_AMBULATORY_CARE_PROVIDER_SITE_OTHER): Payer: Self-pay

## 2022-09-20 ENCOUNTER — Encounter: Payer: Self-pay | Admitting: Physical Therapy

## 2022-09-25 ENCOUNTER — Encounter: Payer: Self-pay | Admitting: Rehabilitation

## 2022-09-25 ENCOUNTER — Ambulatory Visit: Payer: Medicare Other | Admitting: Rehabilitation

## 2022-09-25 DIAGNOSIS — M6281 Muscle weakness (generalized): Secondary | ICD-10-CM | POA: Diagnosis not present

## 2022-09-25 DIAGNOSIS — M5136 Other intervertebral disc degeneration, lumbar region: Secondary | ICD-10-CM | POA: Diagnosis not present

## 2022-09-25 DIAGNOSIS — M5459 Other low back pain: Secondary | ICD-10-CM

## 2022-09-25 NOTE — Therapy (Signed)
OUTPATIENT PHYSICAL THERAPY NEURO TREATMENT   Patient Name: Ricky [name]. MRN: [id number] DOB:1945/05/26, 77 y.o., male Today's Date: 09/25/2022   PCP: Shelva Majestic., MD REFERRING PROVIDER: Judi Saa, DO  END OF SESSION:  PT End of Session - 09/25/22 0819     Visit Number 2    Number of Visits 9    Date for PT Re-Evaluation 11/30/22   pushed out due to schedule availability   Authorization Type Medicare/BCBS    Authorization Time Period 09-17-22 - 12-06-22    PT Start Time 0845    PT Stop Time 0931    PT Time Calculation (min) 46 min    Equipment Utilized During Treatment Other (comment)   floatation devices as needed for safety   Activity Tolerance Patient tolerated treatment well    Behavior During Therapy [facility] for tasks assessed/performed             Past Medical History:  Diagnosis Date   Arthritis    Carotid artery occlusion    Complication of anesthesia    slow to wake up after shoulder surgeries   Hyperlipidemia 03/08/2016   LDL 75 pre stroke. Goal <70 so on atorvastatin 20mg  every other day   Hypertension    Peripheral vascular disease (HCC)    Pneumonia    Stroke [facility])    still has decreased sensation in left hand and arm (as of 03/09/16)   Past Surgical History:  Procedure Laterality Date   CAROTID ENDARTERECTOMY     CATARACT EXTRACTION W/ INTRAOCULAR LENS IMPLANT Bilateral 2024   ENDARTERECTOMY Right 03/19/2016   Procedure: ENDARTERECTOMY CAROTID;  Surgeon: Sherren Kerns, MD;  Location: [facility] OR;  Service: Vascular;  Laterality: Right;   PATCH ANGIOPLASTY Right 03/19/2016   Procedure: PATCH ANGIOPLASTY OF RIGHT CAROTID USING HEMASHIELD PLATINUM FINESSE;  Surgeon: Sherren Kerns, MD;  Location: [facility] OR;  Service: Vascular;  Laterality: Right;   SHOULDER SURGERY Bilateral    left- injections intermittent now  Rotator cuff repair on both shoulders, 2 different surgeries   TONSILLECTOMY     TOTAL KNEE ARTHROPLASTY Left 12/14/2013    Procedure: LEFT TOTAL KNEE ARTHROPLASTY;  Surgeon: Loanne Drilling, MD;  Location: [facility] ORS;  Service: Orthopedics;  Laterality: Left;   Patient Active Problem List   Diagnosis Date Noted   Emphysema lung (HCC) 08/25/2021   Hyperglycemia 08/25/2021   Senile purpura (HCC) 08/25/2021   Degenerative disc disease, lumbar 02/09/2021   Acquired leg length discrepancy 02/09/2021   Leukopenia 01/08/2017   Trigger middle finger of left hand 11/12/2016   Carotid artery stenosis, symptomatic, right 03/19/2016   Hemiparesis affecting dominant side as late effect of stroke (HCC) 03/08/2016   Hyperlipidemia 03/08/2016   History of CVA (cerebrovascular accident) 02/28/2016   Essential hypertension 12/20/2014   OA (osteoarthritis) of knee 12/14/2013    ONSET DATE: Referral date   REFERRING DIAG: M51.36 (ICD-10-CM) - DDD (degenerative disc disease), lumbar  THERAPY DIAG:  Other low back pain  Muscle weakness (generalized)  Rationale for Evaluation and Treatment: Rehabilitation  SUBJECTIVE:  SUBJECTIVE STATEMENT:      Pt presents to pool reporting feeling okay but having more back pain that he did at eval.     Pt accompanied by: self  PERTINENT HISTORY: Lt TKA 12/2013;  CVA Feb. 2018 with LUE residual sensation deficits:  HTN: PVD:  Emphysema lung   PAIN:    Are you having pain? Yes: NPRS scale: 5/10 Pain description: throbbing Aggravating factors: strenuous activity - like yard work or housework  Relieving factors: sitting or lying down helps to relieve  Location:  Low back/lumbar - pt reports slightly more pain on Rt side than on Lt side of low back   PRECAUTIONS: None  RED FLAGS: None   WEIGHT BEARING RESTRICTIONS: No  FALLS: Has patient fallen in last 6 months? No  LIVING ENVIRONMENT: Lives  with: lives with their spouse Lives in: House/apartment Stairs: Yes: Internal: 12 steps; on right going up Has following equipment at home: None  PLOF: Independent  PATIENT GOALS: learn aquatic exercises to do independently  OBJECTIVE:   DIAGNOSTIC FINDINGS: None recently  COGNITION: Overall cognitive status: Within functional limits for tasks assessed   SENSATION: Impaired LUE due to residual effect of CVA in 2018  COORDINATION: WNL's  POSTURE:  increased forward flexion as back pain increases - pt reports pain is less when he leans forward  LOWER EXTREMITY ROM:   WNL's bil. LE's   LOWER EXTREMITY MMT:    MMT Right Eval Left Eval  Hip flexion 4 5  Hip extension    Hip abduction    Hip adduction    Hip internal rotation    Hip external rotation    Knee flexion 4 4+  Knee extension 5 5  Ankle dorsiflexion 4- 5  Ankle plantarflexion    Ankle inversion    Ankle eversion    (Blank rows = not tested)  BED MOBILITY:  Independent  TRANSFERS: Assistive device utilized: None  Sit to stand: Complete Independence Stand to sit: Complete Independence  STAIRS: Level of Assistance: Modified independence Stair Negotiation Technique: Alternating Pattern  with Single Rail on Right Number of Stairs: 4  Height of Stairs: 6  Comments: step over step sequence  GAIT: Gait pattern:  increased Rt foot slap in stance due to weak eccentric dorsiflexors and step through pattern Distance walked: 115' Assistive device utilized: None Level of assistance: Modified independence Comments: increased forward flexed posture as back pain increases  FUNCTIONAL TESTS:  5 times sit to stand: 8.40 secs without UE support from standard chair Timed up and go (TUG): 10.82 secs 10 meter walk test: 12.72 ft = 2.58 ft/sec  PATIENT SURVEYS:  N/A - dx is back pain  TODAY'S TREATMENT:                                                                                                                               DATE: 09-25-22    Patient seen for aquatic therapy today.  Treatment took place in water 3.6-4.0 feet deep depending upon activity.  Pt entered and exited the pool via stairs using B rails in reciprocal pattern.  Pool temp approx 92 deg.   Warm up:  Walking forwards x 18' x 4 laps, backwards x 4 laps, side stepping x 4 laps without UE support.  Needs cues to slow down to allow for improved posture and esp with backwards walking as he tends to let his trunk lead him, needing cues for posture and increasing step length.  Discussed that he does only walking in pool now and that it takes him 30 mins and he is pretty fatigued when done.  PT recommended decreasing length he is walking and focus more on posture along with exercises that I will send him today.  Pt verbalized understanding.    Forward marching holding small barbells x 2 laps, adding in opposite arm motions x 2 more laps.  Had him move to 4' deep as this is depth of the pool he goes to now.  He is able to complete but still needs cues for slower motion to focus on balance and posture.  Side stepping with squat while feet are apart and with small barbells moving them into adduction when legs adduct.    Ai Chi postures for balance, core, and posture:  "Enclosing" x 10 reps, "Soothing" x 10 reps and "Accepting" x 10 reps with each foot placed ahead.  Provided cues for foot placement, however he is not able to squat as low as I would like but I kept him in 4' water to simulate his pool.  PT gave handout on Ai Chi postures and circled and made notes regarding which ones we did.     Balance:  Standing holding small barbells (initially then we moved to the wall so he could focus more on posture) performing hip flex/ext (with knee ext) x 10 reps on each side.  While holding barbells had him do flex, stop then ext, stop but with wall had him move fully through flex and ext before touching floor.  Educated on how to modify these as needed for  more support and to focus more on posture.    Core strengthening:  First performed with small barbells shoulder ext with slow return to flex with feet wide x 8 or so reps, however this was difficult from a balance standpoint in 4' water, so performed rapid alt movements with hands flat against water x 3 reps of 15 secs with feet shoulder width>feet together.  He was able to do this with less difficulty.  Also performed with small pool noodle with feet apart x 10 reps which he was able to do as well.    Pt reports he would like to go ahead and have some things to work on when he goes to the pool.  PT provided 3 Ai Chi postures and will email an initial HEP to him today.  See below.      Pt requires buoyancy of water for support for reduced fall risk and for unloading/reduced stress on joints (spine) as pt able to tolerate increased standing and ambulation in water compared to that on land; viscosity of water is needed for resistance for strengthening and current of water provides perturbations for challenge for balance training      PATIENT EDUCATION: Education details: aquatic rationale Person educated: Patient Education method: Explanation Education comprehension: verbalized understanding  HOME EXERCISE PROGRAM: Access Code: [email or web address] URL: [email or web address] Date: 09/25/2022 Prepared by:  Harriet Butte  Exercises - Forward and Backward Stepping at [employer]  - 1 x daily - 7 x weekly - 1 sets - 2 reps - Lateral Stepping at Pool Wall  - 1 x daily - 7 x weekly - 1 sets - 2 reps - Forward March with Opposite Arm Knee Taps and Hand Floats  - 1 x daily - 7 x weekly - 1 sets - 2 reps - Standing Hip Flexion Extension at [employer]  - 1 x daily - 7 x weekly - 1 sets - 10 reps  GOALS: Goals reviewed with patient? Yes  SHORT TERM GOALS: Target date: 10-26-22  Pt will participate in aquatic PT session with c/o LBP </= 2/10 at end of session. Baseline: Goal status: INITIAL  2.   Pt will amb.10" nonstop in pool in various directions and maintain upright posture at least 75% time, I.e. at least 8". Baseline:  Goal status: INITIAL  3.  Pt will subjectively report increased relief in low back pain by experiencing longer duration prior to onset of back pain. Baseline:  Goal status: INITIAL  4.  Pt will be independent in basic aquatic HEP. Baseline:  Goal status: INITIAL    LONG TERM GOALS: Target date: 11-23-22  Pt will participate in aquatic PT session with c/o LBP </= 1/10 at end of session. Baseline:  Goal status: INITIAL  2.  Pt will subjectively report improvement in low back pain and functional status by reporting ability to carry a 5# object in his home at least 20' without experiencing back pain. Baseline:  Goal status: INITIAL  3.  Pt will negotiate steps in pool area with use of handrails using step over step sequence with supervision without report of increased back pain. Baseline:  Goal status: INITIAL  4.  Independent in updated aquatic HEP to include Ai Chi postures. Baseline:  Goal status: INITIAL   ASSESSMENT:  CLINICAL IMPRESSION: Pt presents to pool for first session at [facility].  Pt able to utilize principles of water to reduce load on joints/spine, increase balance challenge safely and begin strengthening.  Provided pt with initial HEP to begin in the pool per pt request.  He did well with all tasks, but needs continued cues for posture throughout as he tends to flex forward esp with fatigue or less external support.   OBJECTIVE IMPAIRMENTS: difficulty walking, decreased strength, and pain.   ACTIVITY LIMITATIONS: carrying, bending, standing, squatting, stairs, and locomotion level  PARTICIPATION LIMITATIONS: cleaning, shopping, community activity, and yard work  PERSONAL FACTORS: Age, Past/current experiences, Time since onset of injury/illness/exacerbation, and 1 comorbidity: h/o lumbar DDD  are also affecting patient's  functional outcome.   REHAB POTENTIAL: Good  CLINICAL DECISION MAKING: Stable/uncomplicated  EVALUATION COMPLEXITY: Low  PLAN:  PT FREQUENCY: 1x/week  PT DURATION: 8 weeks + eval = 9 visits  PLANNED INTERVENTIONS: Therapeutic exercises, Therapeutic activity, Neuromuscular re-education, Balance training, Gait training, Patient/Family education, Self Care, and Aquatic Therapy  PLAN FOR NEXT SESSION: begin aquatic PT for back pain management - LE strengthening and core strengthening    Harriet Butte, PT, MPT [facility] [address] [place], [place], [address] Phone: [phone]   Fax:  [phone] 09/25/22, 12:03 PM

## 2022-09-30 ENCOUNTER — Other Ambulatory Visit: Payer: Self-pay | Admitting: Family Medicine

## 2022-10-04 ENCOUNTER — Encounter: Payer: Self-pay | Admitting: Family Medicine

## 2022-10-12 ENCOUNTER — Encounter: Payer: Self-pay | Admitting: Family Medicine

## 2022-10-15 ENCOUNTER — Encounter: Payer: Self-pay | Admitting: Family Medicine

## 2022-10-15 NOTE — Telephone Encounter (Signed)
See below

## 2022-10-16 ENCOUNTER — Ambulatory Visit: Payer: Medicare Other | Attending: Family Medicine | Admitting: Rehabilitation

## 2022-10-16 ENCOUNTER — Encounter: Payer: Self-pay | Admitting: Rehabilitation

## 2022-10-16 DIAGNOSIS — M6281 Muscle weakness (generalized): Secondary | ICD-10-CM | POA: Insufficient documentation

## 2022-10-16 DIAGNOSIS — M5459 Other low back pain: Secondary | ICD-10-CM | POA: Diagnosis not present

## 2022-10-16 NOTE — Therapy (Signed)
OUTPATIENT PHYSICAL THERAPY NEURO TREATMENT   Patient Name: Ricky [name]. MRN: [id number] DOB:08-26-1945, 77 y.o., male Today's Date: 10/16/2022   PCP: Shelva Majestic., MD REFERRING PROVIDER: Judi Saa, DO  END OF SESSION:  PT End of Session - 10/16/22 0825     Visit Number 3    Number of Visits 9    Date for PT Re-Evaluation 11/30/22   pushed out due to schedule availability   Authorization Type Medicare/BCBS    Authorization Time Period 09-17-22 - 12-06-22    PT Start Time 0931    PT Stop Time 1017    PT Time Calculation (min) 46 min    Equipment Utilized During Treatment Other (comment)   floatation devices as needed for safety   Activity Tolerance Patient tolerated treatment well    Behavior During Therapy [facility] for tasks assessed/performed             Past Medical History:  Diagnosis Date   Arthritis    Carotid artery occlusion    Complication of anesthesia    slow to wake up after shoulder surgeries   Hyperlipidemia 03/08/2016   LDL 75 pre stroke. Goal <70 so on atorvastatin 20mg  every other day   Hypertension    Peripheral vascular disease (HCC)    Pneumonia    Stroke [employer])    still has decreased sensation in left hand and arm (as of 03/09/16)   Past Surgical History:  Procedure Laterality Date   CAROTID ENDARTERECTOMY     CATARACT EXTRACTION W/ INTRAOCULAR LENS IMPLANT Bilateral 2024   ENDARTERECTOMY Right 03/19/2016   Procedure: ENDARTERECTOMY CAROTID;  Surgeon: Sherren Kerns, MD;  Location: [facility] OR;  Service: Vascular;  Laterality: Right;   PATCH ANGIOPLASTY Right 03/19/2016   Procedure: PATCH ANGIOPLASTY OF RIGHT CAROTID USING HEMASHIELD PLATINUM FINESSE;  Surgeon: Sherren Kerns, MD;  Location: [facility] OR;  Service: Vascular;  Laterality: Right;   SHOULDER SURGERY Bilateral    left- injections intermittent now  Rotator cuff repair on both shoulders, 2 different surgeries   TONSILLECTOMY     TOTAL KNEE ARTHROPLASTY Left 12/14/2013    Procedure: LEFT TOTAL KNEE ARTHROPLASTY;  Surgeon: Loanne Drilling, MD;  Location: [facility] ORS;  Service: Orthopedics;  Laterality: Left;   Patient Active Problem List   Diagnosis Date Noted   Emphysema lung (HCC) 08/25/2021   Hyperglycemia 08/25/2021   Senile purpura (HCC) 08/25/2021   Degenerative disc disease, lumbar 02/09/2021   Acquired leg length discrepancy 02/09/2021   Leukopenia 01/08/2017   Trigger middle finger of left hand 11/12/2016   Carotid artery stenosis, symptomatic, right 03/19/2016   Hemiparesis affecting dominant side as late effect of stroke (HCC) 03/08/2016   Hyperlipidemia 03/08/2016   History of CVA (cerebrovascular accident) 02/28/2016   Essential hypertension 12/20/2014   OA (osteoarthritis) of knee 12/14/2013    ONSET DATE: Referral date   REFERRING DIAG: M51.36 (ICD-10-CM) - DDD (degenerative disc disease), lumbar  THERAPY DIAG:  Other low back pain  Muscle weakness (generalized)  Rationale for Evaluation and Treatment: Rehabilitation  SUBJECTIVE:  SUBJECTIVE STATEMENT:      Pt returns after more than 2 weeks off.  He states he made a mistake when scheduling and accidentally took 2 weeks off.  He has been to the pool once and states exercises went okay.  "Soothing" Ai chi posture caused increased pain the next day.     Pt accompanied by: self  PERTINENT HISTORY: Lt TKA 12/2013;  CVA Feb. 2018 with LUE residual sensation deficits:  HTN: PVD:  Emphysema lung   PAIN:    Are you having pain? Yes: NPRS scale: 3/10 Pain description: throbbing Aggravating factors: strenuous activity - like yard work or housework  Relieving factors: sitting or lying down helps to relieve  Location:  Low back/lumbar - pt reports slightly more pain on Rt side than on Lt side of low  back   PRECAUTIONS: None  RED FLAGS: None   WEIGHT BEARING RESTRICTIONS: No  FALLS: Has patient fallen in last 6 months? No  LIVING ENVIRONMENT: Lives with: lives with their spouse Lives in: House/apartment Stairs: Yes: Internal: 12 steps; on right going up Has following equipment at home: None  PLOF: Independent  PATIENT GOALS: learn aquatic exercises to do independently  OBJECTIVE:   DIAGNOSTIC FINDINGS: None recently  COGNITION: Overall cognitive status: Within functional limits for tasks assessed   SENSATION: Impaired LUE due to residual effect of CVA in 2018  COORDINATION: WNL's  POSTURE:  increased forward flexion as back pain increases - pt reports pain is less when he leans forward  LOWER EXTREMITY ROM:   WNL's bil. LE's   LOWER EXTREMITY MMT:    MMT Right Eval Left Eval  Hip flexion 4 5  Hip extension    Hip abduction    Hip adduction    Hip internal rotation    Hip external rotation    Knee flexion 4 4+  Knee extension 5 5  Ankle dorsiflexion 4- 5  Ankle plantarflexion    Ankle inversion    Ankle eversion    (Blank rows = not tested)  BED MOBILITY:  Independent  TRANSFERS: Assistive device utilized: None  Sit to stand: Complete Independence Stand to sit: Complete Independence  STAIRS: Level of Assistance: Modified independence Stair Negotiation Technique: Alternating Pattern  with Single Rail on Right Number of Stairs: 4  Height of Stairs: 6  Comments: step over step sequence  GAIT: Gait pattern:  increased Rt foot slap in stance due to weak eccentric dorsiflexors and step through pattern Distance walked: 115' Assistive device utilized: None Level of assistance: Modified independence Comments: increased forward flexed posture as back pain increases  FUNCTIONAL TESTS:  5 times sit to stand: 8.40 secs without UE support from standard chair Timed up and go (TUG): 10.82 secs 10 meter walk test: 12.72 ft = 2.58 ft/sec  PATIENT  SURVEYS:  N/A - dx is back pain  TODAY'S TREATMENT:  DATE: 10-16-22    Patient seen for aquatic therapy today.  Treatment took place in water 3.6-4.0 feet deep depending upon activity.  Pt entered and exited the pool via stairs using B rails in reciprocal pattern.  Pool temp approx 92 deg.   Warm up:  Walking forwards x 18' x 4 laps, backwards x 4 laps without UE support, but continue to focus on posture.  Pt clarified that he does 62m of each forward, backwards and side stepping and then does 3 rounds of this.  Educated that he could continue but do only one round and again focus on posture and form rather than speed of movement.    Forward marching holding small barbells x 2 laps, attempted to add in arm motions but this tends to cause him to forward flex so recommended (and marked on HEP) to do just holding barbells not moving them.  Side stepping with squat while feet are apart and with small barbells moving them into adduction when legs adduct.  Educated that he could do side stepping as mentioned with barbells for added challenge rather than just side stepping.  Notes made on HEP.   Standing with large barbells for support, again attempted hip flex/ext with knee ext.  He is able to do slightly better today with balance however feel that posture suffers when he uses barbells so educated to move back to wall but can still work on holding wall less for increased balance challenge.  Would rather him focus on posture/form.  Again, notes made on HEP. Hip circles clockwise and counterclockwise x 10 reps each on each LE with light support from wall.  Pt does well with self correcting posture with these last two exercises.    Sit<>stand from pool bench with feet supported on small step x 10 reps without UE support with cues for upright posture at top (began with arms forward then  with standing transition arms to side).  5 more reps of light touches to bench before return to stand.  Seated ant/post pelvic tilt as he tends to remain in post pelvic tilt at all times.  Max verbal and tactile cues for engage pelvis and avoid over extension of trunk.  Ended with forward step ups with opposite march x 10 reps with single UE support.  Cues for increased forward weight shift onto stance leg for improved support.    Reviewed and performed Ai Chi postures for balance, core, and posture:  "Enclosing" x 10 reps and "Accepting" x 10 reps with each foot placed ahead.  PT did keep him near 4' but still had to cue him to remain in squat position during exercise.  Pt with slight LOB during "Accepting" but able to reset and begin again.  Also educated that he could do with barbells for more support if needed, esp if there are a lot of people increasing current at his pool.       PT had laminated handout for pt today as he reports he did not take handout last time he went to pool on his own.   See updated HEP below plus two Ai Chi postures.      Pt requires buoyancy of water for support for reduced fall risk and for unloading/reduced stress on joints (spine) as pt able to tolerate increased standing and ambulation in water compared to that on land; viscosity of water is needed for resistance for strengthening and current of water provides perturbations for challenge for balance training  PATIENT EDUCATION: Education details: aquatic rationale, updated HEP  Person educated: Patient Education method: Explanation Education comprehension: verbalized understanding  HOME EXERCISE PROGRAM: Access Code: [email or web address] URL: [email or web address] Date: 10/16/2022 Prepared by: Harriet Butte  Exercises - Forward and Backward Stepping at [facility]  - 1 x daily - 7 x weekly - 1 sets - 2 reps - Lateral Stepping at [facility] Wall  - 1 x daily - 7 x weekly - 1 sets - 2 reps - Forward March with  Opposite Arm Knee Taps and Hand Floats  - 1 x daily - 7 x weekly - 1 sets - 2 reps - Standing Hip Flexion Extension at [employer]  - 1 x daily - 7 x weekly - 1 sets - 10 reps - Standing Hip Circles at [employer]  - 1 x daily - 7 x weekly - 3 sets - 10 reps  GOALS: Goals reviewed with patient? Yes  SHORT TERM GOALS: Target date: 10-26-22  Pt will participate in aquatic PT session with c/o LBP </= 2/10 at end of session. Baseline: Goal status: INITIAL  2.  Pt will amb.10" nonstop in pool in various directions and maintain upright posture at least 75% time, I.e. at least 8". Baseline:  Goal status: INITIAL  3.  Pt will subjectively report increased relief in low back pain by experiencing longer duration prior to onset of back pain. Baseline:  Goal status: INITIAL  4.  Pt will be independent in basic aquatic HEP. Baseline:  Goal status: INITIAL    LONG TERM GOALS: Target date: 11-23-22  Pt will participate in aquatic PT session with c/o LBP </= 1/10 at end of session. Baseline:  Goal status: INITIAL  2.  Pt will subjectively report improvement in low back pain and functional status by reporting ability to carry a 5# object in his home at least 20' without experiencing back pain. Baseline:  Goal status: INITIAL  3.  Pt will negotiate steps in pool area with use of handrails using step over step sequence with supervision without report of increased back pain. Baseline:  Goal status: INITIAL  4.  Independent in updated aquatic HEP to include Ai Chi postures. Baseline:  Goal status: INITIAL   ASSESSMENT:  CLINICAL IMPRESSION: Reviewed HEP from last session (had it by the pool this session to make notes).  PT did add one more exercise but note that we removed Ai Chi "Soothing"due to added pain from trunk rotation.  Recommended he go to pool at least once before next pool session to try exercises and ask any questions at next pool session.    OBJECTIVE IMPAIRMENTS: difficulty  walking, decreased strength, and pain.   ACTIVITY LIMITATIONS: carrying, bending, standing, squatting, stairs, and locomotion level  PARTICIPATION LIMITATIONS: cleaning, shopping, community activity, and yard work  PERSONAL FACTORS: Age, Past/current experiences, Time since onset of injury/illness/exacerbation, and 1 comorbidity: h/o lumbar DDD  are also affecting patient's functional outcome.   REHAB POTENTIAL: Good  CLINICAL DECISION MAKING: Stable/uncomplicated  EVALUATION COMPLEXITY: Low  PLAN:  PT FREQUENCY: 1x/week  PT DURATION: 8 weeks + eval = 9 visits  PLANNED INTERVENTIONS: Therapeutic exercises, Therapeutic activity, Neuromuscular re-education, Balance training, Gait training, Patient/Family education, Self Care, and Aquatic Therapy  PLAN FOR NEXT SESSION: See how HEP is going.  Maybe add for core? I haven't tried having him sit on the noodle yet either, I think its going to be hard for him.     Harriet Butte, PT, MPT [facility] [address] [place], [place], [address] Phone: [phone]   Fax:  [phone] 10/16/22, 11:25 AM

## 2022-10-17 ENCOUNTER — Ambulatory Visit: Payer: Medicare Other | Admitting: Pain Medicine

## 2022-10-22 ENCOUNTER — Ambulatory Visit: Payer: Medicare Other | Admitting: Physical Therapy

## 2022-10-22 DIAGNOSIS — M5459 Other low back pain: Secondary | ICD-10-CM

## 2022-10-22 DIAGNOSIS — M6281 Muscle weakness (generalized): Secondary | ICD-10-CM | POA: Diagnosis not present

## 2022-10-22 DIAGNOSIS — G894 Chronic pain syndrome: Secondary | ICD-10-CM | POA: Insufficient documentation

## 2022-10-22 DIAGNOSIS — Z79899 Other long term (current) drug therapy: Secondary | ICD-10-CM | POA: Insufficient documentation

## 2022-10-22 DIAGNOSIS — Z789 Other specified health status: Secondary | ICD-10-CM | POA: Insufficient documentation

## 2022-10-22 DIAGNOSIS — M899 Disorder of bone, unspecified: Secondary | ICD-10-CM | POA: Insufficient documentation

## 2022-10-22 NOTE — Patient Instructions (Signed)

## 2022-10-22 NOTE — Progress Notes (Unsigned)
Patient: Ricky [name].  Service Category: E/M  Provider: Oswaldo Done, MD  DOB: May 17, 1945  DOS: 10/24/2022  Referring Provider: Judi Saa DO  MRN: [id number]  Setting: Ambulatory outpatient  PCP: Shelva Majestic, MD  Type: New Patient  Specialty: Interventional Pain Management    Location: Office  Delivery: Face-to-face     Primary Reason(s) for Visit: Encounter for initial evaluation of one or more chronic problems (new to examiner) potentially causing chronic pain, and posing a threat to normal musculoskeletal function. (Level of risk: High) CC: No chief complaint on file.  HPI  [name] is a 77 y.o. year old, male patient, who comes for the first time to our practice referred by Judi Saa, DO for our initial evaluation of his chronic pain. He has OA (osteoarthritis) of knee; Essential hypertension; History of CVA (cerebrovascular accident); Hemiparesis affecting dominant side as late effect of stroke (HCC); Carotid artery stenosis, symptomatic, right; Trigger middle finger of left hand; Hyperlipidemia; Leukopenia; Degenerative disc disease, lumbar; Acquired leg length discrepancy; Emphysema lung (HCC); Hyperglycemia; Senile purpura (HCC); Chronic pain syndrome; Pharmacologic therapy; Disorder of skeletal system; and Problems influencing health status on their problem list. Today he comes in for evaluation of his No chief complaint on file.  Pain Assessment: Location:     Radiating:   Onset:   Duration:   Quality:   Severity:  /10 (subjective, self-reported pain score)  Effect on ADL:   Timing:   Modifying factors:   BP:    HR:    Onset and Duration: {Hx; Onset and Duration:210120511} Cause of pain: {Hx; Cause:210120521} Severity: {Pain Severity:210120502} Timing: {Symptoms; Timing:210120501} Aggravating Factors: {Causes; Aggravating pain factors:210120507} Alleviating Factors: {Causes; Alleviating Factors:210120500} Associated Problems: {Hx; Associated  problems:210120515} Quality of Pain: {Hx; Symptom quality or Descriptor:210120531} Previous Examinations or Tests: {Hx; Previous examinations or test:210120529} Previous Treatments: {Hx; Previous Treatment:210120503}  [name] is being evaluated for possible interventional pain management therapies for the treatment of his chronic pain.   ***  [name] has been informed that this initial visit was an evaluation only.  On the follow up appointment I will go over the results, including ordered tests and available interventional therapies. At that time he will have the opportunity to decide whether to proceed with offered therapies or not. In the event that [name] prefers avoiding interventional options, this will conclude our involvement in the case.  Medication management recommendations may be provided upon request.  Patient informed that diagnostic tests may be ordered to assist in identifying underlying causes, narrow the list of differential diagnoses and aid in determining candidacy for (or contraindications to) planned therapeutic interventions.  Historic Controlled Substance Pharmacotherapy Review  PMP and historical list of controlled substances: Gabapentin 300 mg capsule, 1 cap p.o. twice daily (60/month) (last filled on 05/26/2022). Most recently prescribed opioid analgesics:   None MME/day: 0 mg/day  Historical Monitoring: The patient  reports no history of drug use. List of prior UDS Testing: No results found for: "MDMA", "COCAINSCRNUR", "PCPSCRNUR", "PCPQUANT", "CANNABQUANT", "THCU", "ETH", "CBDTHCR", "D8THCCBX", "D9THCCBX" Historical Background Evaluation: [place] PMP: PDMP reviewed during this encounter. Review of the past 9-months conducted.             PMP NARX Score Report:  Narcotic: 000 Sedative: 000 Stimulant: 000 [place] Department of public safety, offender search: Engineer, mining Information) Non-contributory Risk Assessment Profile: Aberrant behavior: None observed or  detected today Risk factors for fatal opioid overdose: None identified today PMP NARX Overdose Risk Score:  040 Fatal overdose hazard ratio (HR): Calculation deferred Non-fatal overdose hazard ratio (HR): Calculation deferred Risk of opioid abuse or dependence: 0.7-3.0% with doses <= 36 MME/day and 6.1-26% with doses >= 120 MME/day. Substance use disorder (SUD) risk level: See below Personal History of Substance Abuse (SUD-Substance use disorder):  Alcohol:    Illegal Drugs:    Rx Drugs:    ORT Risk Level calculation:    ORT Scoring interpretation table:  Score <3 = Low Risk for SUD  Score between 4-7 = Moderate Risk for SUD  Score >8 = High Risk for Opioid Abuse   PHQ-2 Depression Scale:  Total score:    PHQ-2 Scoring interpretation table: (Score and probability of major depressive disorder)  Score 0 = No depression  Score 1 = 15.4% Probability  Score 2 = 21.1% Probability  Score 3 = 38.4% Probability  Score 4 = 45.5% Probability  Score 5 = 56.4% Probability  Score 6 = 78.6% Probability   PHQ-9 Depression Scale:  Total score:    PHQ-9 Scoring interpretation table:  Score 0-4 = No depression  Score 5-9 = Mild depression  Score 10-14 = Moderate depression  Score 15-19 = Moderately severe depression  Score 20-27 = Severe depression (2.4 times higher risk of SUD and 2.89 times higher risk of overuse)   Pharmacologic Plan: As per protocol, I have not taken over any controlled substance management, pending the results of ordered tests and/or consults.            Initial impression: Pending review of available data and ordered tests.  Meds   Current Outpatient Medications:    acetaminophen (TYLENOL) 500 MG tablet, Take 500-1,000 mg by mouth every 6 (six) hours as needed (for pain.). , Disp: , Rfl:    albuterol (VENTOLIN HFA) 108 (90 Base) MCG/ACT inhaler, Inhale 2 puffs into the lungs every 6 (six) hours as needed for wheezing or shortness of breath., Disp: 1 each, Rfl: 0    amLODipine (NORVASC) 2.5 MG tablet, Take 1-2 tablets (2.5-5 mg total) by mouth daily. If blood pressure is above 145 on repeat - can take a 2nd dose (Patient not taking: Reported on 09/17/2022), Disp: 135 tablet, Rfl: 3   aspirin EC 81 MG tablet, Take 81 mg by mouth daily. Swallow whole., Disp: , Rfl:    atorvastatin (LIPITOR) 20 MG tablet, TAKE 1 TABLET BY MOUTH EVERY OTHER DAY, Disp: 45 tablet, Rfl: 2   Multiple Vitamin (MULTIVITAMIN WITH MINERALS) TABS tablet, Take 1 tablet by mouth daily., Disp: , Rfl:   Imaging Review  Cervical Imaging: Cervical MR wo contrast: No results found for this or any previous visit.  Cervical MR wo contrast: No valid procedures specified. Cervical MR w/wo contrast: No results found for this or any previous visit.  Cervical MR w contrast: No results found for this or any previous visit.  Cervical CT wo contrast: No results found for this or any previous visit.  Cervical CT w/wo contrast: No results found for this or any previous visit.  Cervical CT w/wo contrast: No results found for this or any previous visit.  Cervical CT w contrast: No results found for this or any previous visit.  Cervical CT outside: No results found for this or any previous visit.  Cervical DG 1 view: No results found for this or any previous visit.  Cervical DG 2-3 views: No results found for this or any previous visit.  Cervical DG F/E views: No results found for this or any  previous visit.  Cervical DG 2-3 clearing views: No results found for this or any previous visit.  Cervical DG Bending/F/E views: No results found for this or any previous visit.  Cervical DG complete: No results found for this or any previous visit.  Cervical DG Myelogram views: No results found for this or any previous visit.  Cervical DG Myelogram views: No results found for this or any previous visit.  Cervical Discogram views: No results found for this or any previous visit.   Shoulder  Imaging: Shoulder-R MR w contrast: No results found for this or any previous visit.  Shoulder-L MR w contrast: No results found for this or any previous visit.  Shoulder-R MR w/wo contrast: No results found for this or any previous visit.  Shoulder-L MR w/wo contrast: No results found for this or any previous visit.  Shoulder-R MR wo contrast: No results found for this or any previous visit.  Shoulder-L MR wo contrast: No results found for this or any previous visit.  Shoulder-R CT w contrast: No results found for this or any previous visit.  Shoulder-L CT w contrast: No results found for this or any previous visit.  Shoulder-R CT w/wo contrast: No results found for this or any previous visit.  Shoulder-L CT w/wo contrast: No results found for this or any previous visit.  Shoulder-R CT wo contrast: No results found for this or any previous visit.  Shoulder-L CT wo contrast: No results found for this or any previous visit.  Shoulder-R DG Arthrogram: No results found for this or any previous visit.  Shoulder-L DG Arthrogram: No results found for this or any previous visit.  Shoulder-R DG 1 view: No results found for this or any previous visit.  Shoulder-L DG 1 view: No results found for this or any previous visit.  Shoulder-R DG: No results found for this or any previous visit.  Shoulder-L DG: No results found for this or any previous visit.   Thoracic Imaging: Thoracic MR wo contrast: No results found for this or any previous visit.  Thoracic MR wo contrast: No valid procedures specified. Thoracic MR w/wo contrast: No results found for this or any previous visit.  Thoracic MR w contrast: No results found for this or any previous visit.  Thoracic CT wo contrast: No results found for this or any previous visit.  Thoracic CT w/wo contrast: No results found for this or any previous visit.  Thoracic CT w/wo contrast: No results found for this or any previous  visit.  Thoracic CT w contrast: No results found for this or any previous visit.  Thoracic DG 2-3 views: No results found for this or any previous visit.  Thoracic DG 4 views: No results found for this or any previous visit.  Thoracic DG: No results found for this or any previous visit.  Thoracic DG w/swimmers view: No results found for this or any previous visit.  Thoracic DG Myelogram views: No results found for this or any previous visit.  Thoracic DG Myelogram views: No results found for this or any previous visit.   Lumbosacral Imaging: Lumbar MR wo contrast: Results for orders placed during the hospital encounter of 05/19/21  MR LUMBAR SPINE WO CONTRAST  Narrative CLINICAL DATA:  Provided history: Degenerative disc disease, lumbar. Low back pain, symptoms persist with greater than 6 weeks treatment; back pain. Additional history provided by scanning technologist: Patient reports low back pain radiating down bilateral legs for 6 months.  EXAM: MRI LUMBAR SPINE WITHOUT CONTRAST  TECHNIQUE: Multiplanar, multisequence MR imaging of the lumbar spine was performed. No intravenous contrast was administered.  COMPARISON:  Lumbar spine radiographs 02/09/2021.  FINDINGS: Segmentation: For the purposes of this dictation, five lumbar vertebrae are assumed and the caudal most well-formed intervertebral disc is designated L5-S1. In correlating with the prior lumbar spine radiographs of 02/09/2021, ribs may be hypoplastic or absent at the T12 level.  Alignment: Lumbar dextrocurvature. 2 mm L1-L2 grade 1 retrolisthesis. 3 mm L2-L3 grade 1 retrolisthesis. Trace L3-L4 grade 1 retrolisthesis. 7 mm L4-L5 grade 1 anterolisthesis.  Vertebrae: No lumbar vertebral compression fracture. Multilevel degenerative endplate irregularity, greatest at L4-L5. L1 vertebral body hemangioma. Mild degenerative endplate edema at Z6-X0, L3-L4 and L4-L5. Multilevel ventrolateral  osteophytes.  Conus medullaris and cauda equina: Conus extends to the L1-L2 level. No signal abnormality identified within the visualized distal spinal cord.  Paraspinal and other soft tissues: Multiple renal cysts, the largest on the left measuring 6 cm. Suspected small gallstone within the gallbladder (series 5, image 16). Atrophy of the lumbar paraspinal musculature.  Disc levels:  Multilevel disc degeneration, greatest at L1-L2 (moderate to moderately advanced), L2-L3 (advanced), L3-L4 (moderate to moderately advanced) and L4-L5 (advanced).  T11-T12: No significant disc herniation or stenosis.  T12-L1: Minimal facet arthrosis. No significant disc herniation or stenosis.  L1-L2: Grade 1 retrolisthesis. Minimal facet arthrosis. No significant spinal canal stenosis. Mild bilateral neural foraminal narrowing (greater on the left).  L2-L3: Grade 1 retrolisthesis. Disc bulge. Mild facet arthrosis. No significant spinal canal stenosis. Mild-to-moderate bilateral neural foraminal narrowing.  L3-L4: Trace grade 1 retrolisthesis. Disc bulge with endplate osteophytes. Mild-to-moderate facet arthrosis with ligamentum flavum hypertrophy. Mild bilateral subarticular narrowing (without nerve root impingement). Central canal patent. Moderate bilateral neural foraminal narrowing (greater on the right).  L4-L5: Grade 1 anterolisthesis. Disc uncovering with disc bulge and endplate osteophytes. Advanced facet arthrosis with ligamentum flavum hypertrophy. Severe right subarticular and central canal stenosis. Moderate left subarticular stenosis. Bilateral neural foraminal narrowing (severe right, moderate left).  L5-S1: Facet arthrosis (moderate right, mild left) with slight ligamentum flavum hypertrophy. Trace fluid within the left facet joint. No significant disc herniation or stenosis.  IMPRESSION: 1. In correlating with the prior lumbar spine radiographs of 02/09/2021, ribs may be  hypoplastic or absent at the T12 level. Careful attention to spinal numbering is recommended prior to any surgical intervention. 2. Lumbar spondylosis, as outlined and with findings most notably as follows. 3. At L4-L5, there is 7 mm grade 1 anterolisthesis. Disc uncovering with disc bulge. Advanced facet arthrosis with ligamentum flavum hypertrophy. Severe right subarticular and central canal stenosis. Moderate left subarticular stenosis. Bilateral neural foraminal narrowing (severe right, moderate left). 4. No more than mild spinal canal narrowing at the remaining levels. 5. Additional sites of foraminal stenosis, as detailed and greatest bilaterally at L2-L3 (mild-to-moderate) and L3-L4 (moderate). 6. Disc degeneration is greatest at L1-L2 (moderate to moderately advanced), L2-L3 (advanced), L3-L4 (moderate to moderately advanced) and L4-L5 (advanced). Mild degenerative endplate edema at R6-E4, L3-L4 and L4-L5. 7. Lumbar dextrocurvature. 8. Mild grade 1 retrolisthesis at L1-L2, L2-L3 and L3-L4. 9. Suspected cholecystolithiasis.   Electronically Signed By: Jackey Loge D.O. On: 05/19/2021 08:13  Lumbar MR wo contrast: No valid procedures specified. Lumbar MR w/wo contrast: No results found for this or any previous visit.  Lumbar MR w/wo contrast: No results found for this or any previous visit.  Lumbar MR w contrast: No results found for this or any previous visit.  Lumbar CT wo contrast: No  results found for this or any previous visit.  Lumbar CT w/wo contrast: No results found for this or any previous visit.  Lumbar CT w/wo contrast: No results found for this or any previous visit.  Lumbar CT w contrast: No results found for this or any previous visit.  Lumbar DG 1V: No results found for this or any previous visit.  Lumbar DG 1V (Clearing): No results found for this or any previous visit.  Lumbar DG 2-3V (Clearing): No results found for this or any previous  visit.  Lumbar DG 2-3 views: Results for orders placed in visit on 02/09/21  DG Lumbar Spine 2-3 Views  Narrative CLINICAL DATA:  back pain. Chronic LBP with recent radiating pains into the right SI area. NKI.  EXAM: LUMBAR SPINE - 2-3 VIEW  COMPARISON:  None.  FINDINGS: Markedly limited evaluation due to overlapping osseous structures and overlying soft tissues.  Five non-rib-bearing lumbar vertebral bodies are identified. Grade 2 anterolisthesis of L4 on L5. Multilevel severe degenerative changes of the spine including intervertebral disc space narrowing, facet arthropathy, osteophyte formation. No radiographic finding of severe osseous neural foraminal stenosis. There is no evidence of lumbar spine fracture.  Stool throughout the colon.  IMPRESSION: 1. Grade 2 anterolisthesis of L4 on L5. 2. Multilevel degenerative changes of the spine.   Electronically Signed By: Tish Frederickson M.D. On: 02/09/2021 16:28  Lumbar DG (Complete) 4+V: No results found for this or any previous visit.        Lumbar DG F/E views: No results found for this or any previous visit.        Lumbar DG Bending views: No results found for this or any previous visit.        Lumbar DG Myelogram views: No results found for this or any previous visit.  Lumbar DG Myelogram: No results found for this or any previous visit.  Lumbar DG Myelogram: No results found for this or any previous visit.  Lumbar DG Myelogram: No results found for this or any previous visit.  Lumbar DG Myelogram Lumbosacral: No results found for this or any previous visit.  Lumbar DG Diskogram views: No results found for this or any previous visit.  Lumbar DG Diskogram views: No results found for this or any previous visit.  Lumbar DG Epidurogram OP: No results found for this or any previous visit.  Lumbar DG Epidurogram IP: No valid procedures specified.  Sacroiliac Joint Imaging: Sacroiliac Joint DG: No results found  for this or any previous visit.  Sacroiliac Joint MR w/wo contrast: No results found for this or any previous visit.  Sacroiliac Joint MR wo contrast: No results found for this or any previous visit.   Spine Imaging: Whole Spine DG Myelogram views: No results found for this or any previous visit.  Whole Spine MR Mets screen: No results found for this or any previous visit.  Whole Spine MR Mets screen: No results found for this or any previous visit.  Whole Spine MR w/wo: No results found for this or any previous visit.  MRA Spinal Canal w/ cm: No results found for this or any previous visit.  MRA Spinal Canal wo/ cm: No valid procedures specified. MRA Spinal Canal w/wo cm: No results found for this or any previous visit.  Spine Outside MR Films: No results found for this or any previous visit.  Spine Outside CT Films: No results found for this or any previous visit.  CT-Guided Biopsy: No results found for this or  any previous visit.  CT-Guided Needle Placement: No results found for this or any previous visit.  DG Spine outside: No results found for this or any previous visit.  IR Spine outside: No results found for this or any previous visit.  NM Spine outside: No results found for this or any previous visit.   Hip Imaging: Hip-R MR w contrast: No results found for this or any previous visit.  Hip-L MR w contrast: No results found for this or any previous visit.  Hip-R MR w/wo contrast: No results found for this or any previous visit.  Hip-L MR w/wo contrast: No results found for this or any previous visit.  Hip-R MR wo contrast: No results found for this or any previous visit.  Hip-L MR wo contrast: No results found for this or any previous visit.  Hip-R CT w contrast: No results found for this or any previous visit.  Hip-L CT w contrast: No results found for this or any previous visit.  Hip-R CT w/wo contrast: No results found for this or any previous  visit.  Hip-L CT w/wo contrast: No results found for this or any previous visit.  Hip-R CT wo contrast: No results found for this or any previous visit.  Hip-L CT wo contrast: No results found for this or any previous visit.  Hip-R DG 2-3 views: No results found for this or any previous visit.  Hip-L DG 2-3 views: No results found for this or any previous visit.  Hip-R DG Arthrogram: No results found for this or any previous visit.  Hip-L DG Arthrogram: No results found for this or any previous visit.  Hip-B DG Bilateral: No results found for this or any previous visit.  Hip-B DG Bilateral (5V): No results found for this or any previous visit.   Knee Imaging: Knee-R MR w contrast: No results found for this or any previous visit.  Knee-L MR w/o contrast: No results found for this or any previous visit.  Knee-R MR w/wo contrast: No results found for this or any previous visit.  Knee-L MR w/wo contrast: No results found for this or any previous visit.  Knee-R MR wo contrast: No results found for this or any previous visit.  Knee-L MR wo contrast: No results found for this or any previous visit.  Knee-R CT w contrast: No results found for this or any previous visit.  Knee-L CT w contrast: No results found for this or any previous visit.  Knee-R CT w/wo contrast: No results found for this or any previous visit.  Knee-L CT w/wo contrast: No results found for this or any previous visit.  Knee-R CT wo contrast: No results found for this or any previous visit.  Knee-L CT wo contrast: No results found for this or any previous visit.  Knee-R DG 1-2 views: No results found for this or any previous visit.  Knee-L DG 1-2 views: No results found for this or any previous visit.  Knee-R DG 3 views: No results found for this or any previous visit.  Knee-L DG 3 views: No results found for this or any previous visit.  Knee-R DG 4 views: No results found for this or any previous  visit.  Knee-L DG 4 views: No results found for this or any previous visit.  Knee-R DG Arthrogram: No results found for this or any previous visit.  Knee-L DG Arthrogram: No results found for this or any previous visit.   Ankle Imaging: Ankle-R DG Complete: No results found  for this or any previous visit.  Ankle-L DG Complete: No results found for this or any previous visit.   Foot Imaging: Foot-R DG Complete: No results found for this or any previous visit.  Foot-L DG Complete: No results found for this or any previous visit.   Elbow Imaging: Elbow-R DG Complete: No results found for this or any previous visit.  Elbow-L DG Complete: No results found for this or any previous visit.   Wrist Imaging: Wrist-R DG Complete: No results found for this or any previous visit.  Wrist-L DG Complete: No results found for this or any previous visit.   Hand Imaging: Hand-R DG Complete: No results found for this or any previous visit.  Hand-L DG Complete: No results found for this or any previous visit.   Complexity Note: Imaging results reviewed.                         ROS  Cardiovascular: {Hx; Cardiovascular History:210120525} Pulmonary or Respiratory: {Hx; Pumonary and/or Respiratory History:210120523} Neurological: {Hx; Neurological:210120504} Psychological-Psychiatric: {Hx; Psychological-Psychiatric History:210120512} Gastrointestinal: {Hx; Gastrointestinal:210120527} Genitourinary: {Hx; Genitourinary:210120506} Hematological: {Hx; Hematological:210120510} Endocrine: {Hx; Endocrine history:210120509} Rheumatologic: {Hx; Rheumatological:210120530} Musculoskeletal: {Hx; Musculoskeletal:210120528} Work History: {Hx; Work history:210120514}  Allergies  [name] is allergic to amoxicillin.  Laboratory Chemistry Profile   Renal Lab Results  Component Value Date   BUN 21 08/01/2022   CREATININE 0.76 08/01/2022   GFR 85.99 08/01/2022   GFRAA >60 03/20/2016    GFRNONAA >60 03/20/2016   SPECGRAV 1.020 08/03/2019   PHUR 6.0 08/03/2019   PROTEINUR Negative 08/03/2019     Electrolytes Lab Results  Component Value Date   NA 142 08/01/2022   K 4.2 08/01/2022   CL 108 08/01/2022   CALCIUM 9.0 08/01/2022     Hepatic Lab Results  Component Value Date   AST 24 08/01/2022   ALT 18 08/01/2022   ALBUMIN 3.8 08/01/2022   ALKPHOS 57 08/01/2022     ID Lab Results  Component Value Date   SARSCOV2NAA Not Detected 10/27/2020   STAPHAUREUS NEGATIVE 03/09/2016   MRSAPCR NEGATIVE 03/09/2016   HCVAB NEGATIVE 12/20/2014     Bone No results found for: "VD25OH", "VD125OH2TOT", "ZO1096EA5", "VD2125OH2", "25OHVITD1", "25OHVITD2", "25OHVITD3", "TESTOFREE", "TESTOSTERONE"   Endocrine Lab Results  Component Value Date   GLUCOSE 110 (H) 08/01/2022   GLUCOSEU NEGATIVE 03/09/2016   HGBA1C 5.9 08/01/2022   TSH 2.45 08/01/2022     Neuropathy Lab Results  Component Value Date   VITAMINB12 424 08/01/2022   HGBA1C 5.9 08/01/2022     CNS No results found for: "COLORCSF", "APPEARCSF", "RBCCOUNTCSF", "WBCCSF", "POLYSCSF", "LYMPHSCSF", "EOSCSF", "PROTEINCSF", "GLUCCSF", "JCVIRUS", "CSFOLI", "IGGCSF", "LABACHR", "ACETBL"   Inflammation (CRP: Acute  ESR: Chronic) Lab Results  Component Value Date   CRP <1.0 08/01/2022   ESRSEDRATE 7 08/01/2022     Rheumatology No results found for: "RF", "ANA", "LABURIC", "URICUR", "LYMEIGGIGMAB", "LYMEABIGMQN", "HLAB27"   Coagulation Lab Results  Component Value Date   INR 1.02 03/09/2016   LABPROT 13.4 03/09/2016   APTT 40 (H) 03/09/2016   PLT 254.0 08/01/2022     Cardiovascular Lab Results  Component Value Date   HGB 12.8 (L) 08/01/2022   HCT 38.7 (L) 08/01/2022     Screening Lab Results  Component Value Date   SARSCOV2NAA Not Detected 10/27/2020   STAPHAUREUS NEGATIVE 03/09/2016   MRSAPCR NEGATIVE 03/09/2016   HCVAB NEGATIVE 12/20/2014     Cancer No results found for: "CEA", "CA125", "LABCA2"  Allergens No results found for: "ALMOND", "APPLE", "ASPARAGUS", "AVOCADO", "BANANA", "BARLEY", "BASIL", "BAYLEAF", "GREENBEAN", "LIMABEAN", "WHITEBEAN", "BEEFIGE", "REDBEET", "BLUEBERRY", "BROCCOLI", "CABBAGE", "MELON", "CARROT", "CASEIN", "CASHEWNUT", "CAULIFLOWER", "CELERY"     Note: Lab results reviewed.  PFSH  Drug: [name]  reports no history of drug use. Alcohol:  reports no history of alcohol use. Tobacco:  reports that he has never smoked. He has never been exposed to tobacco smoke. He has never used smokeless tobacco. Medical:  has a past medical history of Arthritis, Carotid artery occlusion, Complication of anesthesia, Hyperlipidemia (03/08/2016), Hypertension, Peripheral vascular disease (HCC), Pneumonia, and Stroke (HCC). Family: family history includes Heart disease in his father; Other in his mother.  Past Surgical History:  Procedure Laterality Date   CAROTID ENDARTERECTOMY     CATARACT EXTRACTION W/ INTRAOCULAR LENS IMPLANT Bilateral 2024   ENDARTERECTOMY Right 03/19/2016   Procedure: ENDARTERECTOMY CAROTID;  Surgeon: Sherren Kerns, MD;  [name];  Service: Vascular;  Laterality: Right;   PATCH ANGIOPLASTY Right 03/19/2016   Procedure: PATCH ANGIOPLASTY OF RIGHT CAROTID USING HEMASHIELD PLATINUM FINESSE;  Surgeon: Sherren Kerns, MD;  Location: [facility] OR;  Service: Vascular;  Laterality: Right;   SHOULDER SURGERY Bilateral    left- injections intermittent now  Rotator cuff repair on both shoulders, 2 different surgeries   TONSILLECTOMY     TOTAL KNEE ARTHROPLASTY Left 12/14/2013   Procedure: LEFT TOTAL KNEE ARTHROPLASTY;  Surgeon: Loanne Drilling, MD;  Location: [facility] ORS;  Service: Orthopedics;  Laterality: Left;   Active Ambulatory Problems    Diagnosis Date Noted   OA (osteoarthritis) of knee 12/14/2013   Essential hypertension 12/20/2014   History of CVA (cerebrovascular accident) 02/28/2016   Hemiparesis affecting dominant side as late effect of stroke  (HCC) 03/08/2016   Carotid artery stenosis, symptomatic, right 03/19/2016   Trigger middle finger of left hand 11/12/2016   Hyperlipidemia 03/08/2016   Leukopenia 01/08/2017   Degenerative disc disease, lumbar 02/09/2021   Acquired leg length discrepancy 02/09/2021   Emphysema lung (HCC) 08/25/2021   Hyperglycemia 08/25/2021   Senile purpura (HCC) 08/25/2021   Chronic pain syndrome 10/22/2022   Pharmacologic therapy 10/22/2022   Disorder of skeletal system 10/22/2022   Problems influencing health status 10/22/2022   Resolved Ambulatory Problems    Diagnosis Date Noted   Occlusion and stenosis of carotid artery 02/29/2016   Hyperlipidemia 03/08/2016   Past Medical History:  Diagnosis Date   Arthritis    Carotid artery occlusion    Complication of anesthesia    Hypertension    Peripheral vascular disease (HCC)    Pneumonia    Stroke (HCC)    Constitutional Exam  General appearance: Well nourished, well developed, and well hydrated. In no apparent acute distress There were no vitals filed for this visit. BMI Assessment: Estimated body mass index is 22.47 kg/m as calculated from the following:   Height as of 08/28/22: 5\' 5"  (1.651 m).   Weight as of 08/28/22: 135 lb (61.2 kg).  BMI interpretation table: BMI level Category Range association with higher incidence of chronic pain  <18 kg/m2 Underweight   18.5-24.9 kg/m2 Ideal body weight   25-29.9 kg/m2 Overweight Increased incidence by 20%  30-34.9 kg/m2 Obese (Class I) Increased incidence by 68%  35-39.9 kg/m2 Severe obesity (Class II) Increased incidence by 136%  >40 kg/m2 Extreme obesity (Class III) Increased incidence by 254%   Patient's current BMI Ideal Body weight  There is no height or weight on file to calculate BMI. Patient  weight not recorded   BMI Readings from Last 4 Encounters:  08/28/22 22.47 kg/m  08/01/22 22.51 kg/m  06/28/22 22.80 kg/m  05/08/22 23.20 kg/m   Wt Readings from Last 4 Encounters:   08/28/22 135 lb (61.2 kg)  08/01/22 135 lb 4 oz (61.3 kg)  06/28/22 137 lb (62.1 kg)  05/08/22 139 lb 6.4 oz (63.2 kg)    Psych/Mental status: Alert, oriented x 3 (person, place, & time)       Eyes: PERLA Respiratory: No evidence of acute respiratory distress  Assessment  Primary Diagnosis & Pertinent Problem List: The primary encounter diagnosis was Chronic pain syndrome. Diagnoses of Pharmacologic therapy, Disorder of skeletal system, and Problems influencing health status were also pertinent to this visit.  Visit Diagnosis (New problems to examiner): 1. Chronic pain syndrome   2. Pharmacologic therapy   3. Disorder of skeletal system   4. Problems influencing health status    Plan of Care (Initial workup plan)  Note: [name] was reminded that as per protocol, today's visit has been an evaluation only. We have not taken over the patient's controlled substance management.  Problem-specific plan: No problem-specific Assessment & Plan notes found for this encounter.  Lab Orders  No laboratory test(s) ordered today   Imaging Orders  No imaging studies ordered today   Referral Orders  No referral(s) requested today   Procedure Orders    No procedure(s) ordered today   Pharmacotherapy (current): Medications ordered:  No orders of the defined types were placed in this encounter.  Medications administered during this visit: Franklyn Lor. had no medications administered during this visit.   Analgesic Pharmacotherapy:  Opioid Analgesics: For patients currently taking or requesting to take opioid analgesics, in accordance with [facility] Guidelines, we will assess their risks and indications for the use of these substances. After completing our evaluation, we may offer recommendations, but we no longer take patients for medication management. The prescribing physician will ultimately decide, based on his/her training and level of comfort whether to  adopt any of the recommendations, including whether or not to prescribe such medicines.  Membrane stabilizer: To be determined at a later time  Muscle relaxant: To be determined at a later time  NSAID: To be determined at a later time  Other analgesic(s): To be determined at a later time   Interventional management options: [name] was informed that there is no guarantee that he would be a candidate for interventional therapies. The decision will be based on the results of diagnostic studies, as well as [name]'s risk profile.  Procedure(s) under consideration:  Pending results of ordered studies      Interventional Therapies  Risk Factors  Considerations  Medical Comorbidities:     Planned  Pending:      Under consideration:   Pending   Completed:   None at this time   Therapeutic  Palliative (PRN) options:   None established   Completed by other providers:   None reported       Provider-requested follow-up: No follow-ups on file.  Future Appointments  Date Time Provider Department Center  10/22/2022  2:45 PM Kerry Fort, Cricket [facility]  10/24/2022 11:00 AM Delano Metz, MD [facility] None  10/29/2022  2:45 PM Kerry Fort, PT [facility]  11/01/2022 11:00 AM Lula Olszewski, MD [facility]  12/28/2022  3:00 PM Shelva Majestic, MD [facility]  05/14/2023  9:00 AM [facility] ANNUAL WELLNESS VISIT 1 [facility]  Duration of encounter: *** minutes.  Total time on encounter, as per AMA guidelines included both the face-to-face and non-face-to-face time personally spent by the physician and/or other qualified health care professional(s) on the day of the encounter (includes time in activities that require the physician or other qualified health care professional and does not include time in activities normally performed by clinical staff). Physician's time may include the following activities when performed: Preparing to see the patient (e.g.,  pre-charting review of records, searching for previously ordered imaging, lab work, and nerve conduction tests) Review of prior analgesic pharmacotherapies. Reviewing PMP Interpreting ordered tests (e.g., lab work, imaging, nerve conduction tests) Performing post-procedure evaluations, including interpretation of diagnostic procedures Obtaining and/or reviewing separately obtained history Performing a medically appropriate examination and/or evaluation Counseling and educating the patient/family/caregiver Ordering medications, tests, or procedures Referring and communicating with other health care professionals (when not separately reported) Documenting clinical information in the electronic or other health record Independently interpreting results (not separately reported) and communicating results to the patient/ family/caregiver Care coordination (not separately reported)  Note by: Oswaldo Done, MD (TTS technology used. I apologize for any typographical errors that were not detected and corrected.) Date: 10/24/2022; Time: 12:39 PM

## 2022-10-23 ENCOUNTER — Encounter: Payer: Self-pay | Admitting: Physical Therapy

## 2022-10-23 NOTE — Therapy (Signed)
OUTPATIENT PHYSICAL THERAPY NEURO TREATMENT/AQUATIC THERAPY   Patient Name: Ricky [name]. MRN: [id number] DOB:02-Aug-1945, 77 y.o., male Today's Date: 10/23/2022   PCP: Shelva Majestic., MD REFERRING PROVIDER: Judi Saa, DO  END OF SESSION:  PT End of Session - 10/23/22 1348     Visit Number 4    Number of Visits 9    Date for PT Re-Evaluation 11/30/22   pushed out due to schedule availability   Authorization Type Medicare/BCBS    Authorization Time Period 09-17-22 - 12-06-22    PT Start Time 1445    PT Stop Time 1535    PT Time Calculation (min) 50 min    Equipment Utilized During Treatment Other (comment)   bar bells (small, multi-colored)   Activity Tolerance Patient tolerated treatment well    Behavior During Therapy [facility] for tasks assessed/performed             Past Medical History:  Diagnosis Date   Arthritis    Carotid artery occlusion    Complication of anesthesia    slow to wake up after shoulder surgeries   Hyperlipidemia 03/08/2016   LDL 75 pre stroke. Goal <70 so on atorvastatin 20mg  every other day   Hypertension    Peripheral vascular disease (HCC)    Pneumonia    Stroke [facility])    still has decreased sensation in left hand and arm (as of 03/09/16)   Past Surgical History:  Procedure Laterality Date   CAROTID ENDARTERECTOMY     CATARACT EXTRACTION W/ INTRAOCULAR LENS IMPLANT Bilateral 2024   ENDARTERECTOMY Right 03/19/2016   Procedure: ENDARTERECTOMY CAROTID;  Surgeon: Sherren Kerns, MD;  Location: [facility] OR;  Service: Vascular;  Laterality: Right;   PATCH ANGIOPLASTY Right 03/19/2016   Procedure: PATCH ANGIOPLASTY OF RIGHT CAROTID USING HEMASHIELD PLATINUM FINESSE;  Surgeon: Sherren Kerns, MD;  Location: [facility] OR;  Service: Vascular;  Laterality: Right;   SHOULDER SURGERY Bilateral    left- injections intermittent now  Rotator cuff repair on both shoulders, 2 different surgeries   TONSILLECTOMY     TOTAL KNEE ARTHROPLASTY Left 12/14/2013    Procedure: LEFT TOTAL KNEE ARTHROPLASTY;  Surgeon: Loanne Drilling, MD;  Location: [facility] ORS;  Service: Orthopedics;  Laterality: Left;   Patient Active Problem List   Diagnosis Date Noted   Chronic pain syndrome 10/22/2022   Pharmacologic therapy 10/22/2022   Disorder of skeletal system 10/22/2022   Problems influencing health status 10/22/2022   Emphysema lung (HCC) 08/25/2021   Hyperglycemia 08/25/2021   Senile purpura (HCC) 08/25/2021   Degenerative disc disease, lumbar 02/09/2021   Acquired leg length discrepancy 02/09/2021   Leukopenia 01/08/2017   Trigger middle finger of left hand 11/12/2016   Carotid artery stenosis, symptomatic, right 03/19/2016   Hemiparesis affecting dominant side as late effect of stroke (HCC) 03/08/2016   Hyperlipidemia 03/08/2016   History of CVA (cerebrovascular accident) 02/28/2016   Essential hypertension 12/20/2014   OA (osteoarthritis) of knee 12/14/2013    ONSET DATE: Referral date   REFERRING DIAG: M51.36 (ICD-10-CM) - DDD (degenerative disc disease), lumbar  THERAPY DIAG:  Other low back pain  Muscle weakness (generalized)  Rationale for Evaluation and Treatment: Rehabilitation  SUBJECTIVE:  SUBJECTIVE STATEMENT:      Pt presents to aquatic PT appt at [facility] - pt states he has been going to [employer] and doing aquatic exercise - has slowed his walking speed down in the pool and has focused more on upright posture - says the laminated cards help a lot at the pool to know what to do   Pt accompanied by: self  PERTINENT HISTORY: Lt TKA 12/2013;  CVA Feb. 2018 with LUE residual sensation deficits:  HTN: PVD:  Emphysema lung   PAIN:    Are you having pain? Yes: NPRS scale: 3/10 Pain description: throbbing Aggravating factors: strenuous activity -  like yard work or housework  Relieving factors: sitting or lying down helps to relieve  Location:  Low back/lumbar - pt reports slightly more pain on Rt side than on Lt side of low back   PRECAUTIONS: None  RED FLAGS: None   WEIGHT BEARING RESTRICTIONS: No  FALLS: Has patient fallen in last 6 months? No  LIVING ENVIRONMENT: Lives with: lives with their spouse Lives in: House/apartment Stairs: Yes: Internal: 12 steps; on right going up Has following equipment at home: None  PLOF: Independent  PATIENT GOALS: learn aquatic exercises to do independently  OBJECTIVE:   DIAGNOSTIC FINDINGS: None recently  COGNITION: Overall cognitive status: Within functional limits for tasks assessed   SENSATION: Impaired LUE due to residual effect of CVA in 2018  COORDINATION: WNL's  POSTURE:  increased forward flexion as back pain increases - pt reports pain is less when he leans forward  LOWER EXTREMITY ROM:   WNL's bil. LE's   LOWER EXTREMITY MMT:    MMT Right Eval Left Eval  Hip flexion 4 5  Hip extension    Hip abduction    Hip adduction    Hip internal rotation    Hip external rotation    Knee flexion 4 4+  Knee extension 5 5  Ankle dorsiflexion 4- 5  Ankle plantarflexion    Ankle inversion    Ankle eversion    (Blank rows = not tested)  BED MOBILITY:  Independent  TRANSFERS: Assistive device utilized: None  Sit to stand: Complete Independence Stand to sit: Complete Independence  STAIRS: Level of Assistance: Modified independence Stair Negotiation Technique: Alternating Pattern  with Single Rail on Right Number of Stairs: 4  Height of Stairs: 6  Comments: step over step sequence  GAIT: Gait pattern:  increased Rt foot slap in stance due to weak eccentric dorsiflexors and step through pattern Distance walked: 115' Assistive device utilized: None Level of assistance: Modified independence Comments: increased forward flexed posture as back pain  increases  FUNCTIONAL TESTS:  5 times sit to stand: 8.40 secs without UE support from standard chair Timed up and go (TUG): 10.82 secs 10 meter walk test: 12.72 ft = 2.58 ft/sec  PATIENT SURVEYS:  N/A - dx is back pain  TODAY'S TREATMENT:  DATE: 10-23-22    Patient seen for aquatic therapy today.  Treatment took place in water 3.6' water depth in lap pool as therapy pool was closed for repair.  Pt entered and exited the pool via step negotiation with use of hand rails modified independently.    Warm up:  pt performed water walking forwards approx. 40' x 3 reps in lap pool without UE support on floatation device; cues for upright posture as tolerated  Sideways amb. 40' x 2 reps; backwards amb. 40' x 2 reps with UE support on pool noodle  Marching in place 10 reps with 1-2 sec hold to facilitate improved SLS on stance leg; 2nd set - pt held small barbells - moved contralateral UE/LE marching in place 10 reps each  Pt held yellow noodle - performed small trunk rotations to each side approx. 5 reps each  Pt performed squats 10 reps bil. LE's with UE support on pool edge prn  Pt performed standing hip flexion, extension and abduction 10 reps each leg with UE support prn on pool edge 10 reps each   Pt performed Ai Chi posture "Balancing"  10 reps each leg with tactile cues for correct sequence of arm/leg movement and with 1 UE support on pool edge    Pt requires buoyancy of water for support for reduced fall risk and for unloading/reduced stress on joints (spine) as pt able to tolerate increased standing and ambulation in water compared to that on land; viscosity of water is needed for resistance for strengthening and current of water provides perturbations for challenge for balance training      PATIENT EDUCATION: Education details: aquatic rationale, updated HEP   Person educated: Patient Education method: Explanation Education comprehension: verbalized understanding  HOME EXERCISE PROGRAM: Access Code: [email or web address] URL: [email or web address] Date: 10/16/2022 Prepared by: Harriet Butte  Exercises - Forward and Backward Stepping at [facility]  - 1 x daily - 7 x weekly - 1 sets - 2 reps - Lateral Stepping at [facility]  - 1 x daily - 7 x weekly - 1 sets - 2 reps - Forward March with Opposite Arm Knee Taps and Hand Floats  - 1 x daily - 7 x weekly - 1 sets - 2 reps - Standing Hip Flexion Extension at [employer]  - 1 x daily - 7 x weekly - 1 sets - 10 reps - Standing Hip Circles at [employer]  - 1 x daily - 7 x weekly - 3 sets - 10 reps  GOALS: Goals reviewed with patient? Yes  SHORT TERM GOALS: Target date: 10-26-22  Pt will participate in aquatic PT session with c/o LBP </= 2/10 at end of session. Baseline: Goal status: INITIAL  2.  Pt will amb.10" nonstop in pool in various directions and maintain upright posture at least 75% time, I.e. at least 8". Baseline:  Goal status: INITIAL  3.  Pt will subjectively report increased relief in low back pain by experiencing longer duration prior to onset of back pain. Baseline:  Goal status: INITIAL  4.  Pt will be independent in basic aquatic HEP. Baseline:  Goal status: INITIAL    LONG TERM GOALS: Target date: 11-23-22  Pt will participate in aquatic PT session with c/o LBP </= 1/10 at end of session. Baseline:  Goal status: INITIAL  2.  Pt will subjectively report improvement in low back pain and functional status by reporting ability to carry a 5# object in his home at least 20' without experiencing  back pain. Baseline:  Goal status: INITIAL  3.  Pt will negotiate steps in pool area with use of handrails using step over step sequence with supervision without report of increased back pain. Baseline:  Goal status: INITIAL  4.  Independent in updated aquatic HEP to include Ai  Chi postures. Baseline:  Goal status: INITIAL   ASSESSMENT:  CLINICAL IMPRESSION: Aquatic PT session focused on water walking and LE strengthening and core stabilization exercises.  Pt focusing on decreasing speed with water walking to facilitate more erect posture.  Pt flexes trunk to reduce back pain; increased thoracic extension increases back pain.  Pt needed intermittent UE support for safety with balance exercises.  Pt tolerated aquatic exercises well. Cont POC.     OBJECTIVE IMPAIRMENTS: difficulty walking, decreased strength, and pain.   ACTIVITY LIMITATIONS: carrying, bending, standing, squatting, stairs, and locomotion level  PARTICIPATION LIMITATIONS: cleaning, shopping, community activity, and yard work  PERSONAL FACTORS: Age, Past/current experiences, Time since onset of injury/illness/exacerbation, and 1 comorbidity: h/o lumbar DDD  are also affecting patient's functional outcome.   REHAB POTENTIAL: Good  CLINICAL DECISION MAKING: Stable/uncomplicated  EVALUATION COMPLEXITY: Low  PLAN:  PT FREQUENCY: 1x/week  PT DURATION: 8 weeks + eval = 9 visits  PLANNED INTERVENTIONS: Therapeutic exercises, Therapeutic activity, Neuromuscular re-education, Balance training, Gait training, Patient/Family education, Self Care, and Aquatic Therapy  PLAN FOR NEXT SESSION: Check STG's; See how HEP is going.  Maybe add for core? I haven't tried having him sit on the noodle yet either, I think its going to be hard for him.     Kerry Fort, PT [facility] [address] [place], [place], [address] Phone: [phone]   Fax:  [phone] 10/23/22, 7:05 PM

## 2022-10-24 ENCOUNTER — Ambulatory Visit (HOSPITAL_BASED_OUTPATIENT_CLINIC_OR_DEPARTMENT_OTHER): Payer: Medicare Other | Admitting: Pain Medicine

## 2022-10-24 ENCOUNTER — Encounter: Payer: Self-pay | Admitting: Pain Medicine

## 2022-10-24 ENCOUNTER — Ambulatory Visit
Admission: RE | Admit: 2022-10-24 | Discharge: 2022-10-24 | Disposition: A | Discharge status: Home / Self Care | Payer: Medicare Other | Source: Ambulatory Visit | Attending: Pain Medicine | Admitting: Pain Medicine

## 2022-10-24 VITALS — BP 158/93 | HR 58 | Temp 97.2°F | Resp 16 | Ht 65.0 in | Wt 132.0 lb

## 2022-10-24 DIAGNOSIS — M4310 Spondylolisthesis, site unspecified: Secondary | ICD-10-CM | POA: Insufficient documentation

## 2022-10-24 DIAGNOSIS — M545 Low back pain, unspecified: Secondary | ICD-10-CM | POA: Insufficient documentation

## 2022-10-24 DIAGNOSIS — G894 Chronic pain syndrome: Secondary | ICD-10-CM

## 2022-10-24 DIAGNOSIS — M899 Disorder of bone, unspecified: Secondary | ICD-10-CM

## 2022-10-24 DIAGNOSIS — M4316 Spondylolisthesis, lumbar region: Secondary | ICD-10-CM

## 2022-10-24 DIAGNOSIS — M4186 Other forms of scoliosis, lumbar region: Secondary | ICD-10-CM

## 2022-10-24 DIAGNOSIS — G8929 Other chronic pain: Secondary | ICD-10-CM

## 2022-10-24 DIAGNOSIS — M5137 Other intervertebral disc degeneration, lumbosacral region: Secondary | ICD-10-CM

## 2022-10-24 DIAGNOSIS — M2428 Disorder of ligament, vertebrae: Secondary | ICD-10-CM | POA: Insufficient documentation

## 2022-10-24 DIAGNOSIS — M47816 Spondylosis without myelopathy or radiculopathy, lumbar region: Secondary | ICD-10-CM | POA: Insufficient documentation

## 2022-10-24 DIAGNOSIS — Z79899 Other long term (current) drug therapy: Secondary | ICD-10-CM | POA: Insufficient documentation

## 2022-10-24 DIAGNOSIS — M48061 Spinal stenosis, lumbar region without neurogenic claudication: Secondary | ICD-10-CM

## 2022-10-24 DIAGNOSIS — R937 Abnormal findings on diagnostic imaging of other parts of musculoskeletal system: Secondary | ICD-10-CM | POA: Insufficient documentation

## 2022-10-24 DIAGNOSIS — Z789 Other specified health status: Secondary | ICD-10-CM

## 2022-10-24 DIAGNOSIS — M51379 Other intervertebral disc degeneration, lumbosacral region without mention of lumbar back pain or lower extremity pain: Secondary | ICD-10-CM

## 2022-10-25 DIAGNOSIS — M48061 Spinal stenosis, lumbar region without neurogenic claudication: Secondary | ICD-10-CM | POA: Insufficient documentation

## 2022-10-25 DIAGNOSIS — M545 Low back pain, unspecified: Secondary | ICD-10-CM | POA: Insufficient documentation

## 2022-10-25 DIAGNOSIS — M4310 Spondylolisthesis, site unspecified: Secondary | ICD-10-CM | POA: Insufficient documentation

## 2022-10-25 DIAGNOSIS — M47816 Spondylosis without myelopathy or radiculopathy, lumbar region: Secondary | ICD-10-CM | POA: Insufficient documentation

## 2022-10-25 DIAGNOSIS — M2428 Disorder of ligament, vertebrae: Secondary | ICD-10-CM | POA: Insufficient documentation

## 2022-10-28 ENCOUNTER — Encounter: Payer: Self-pay | Admitting: Family Medicine

## 2022-10-28 DIAGNOSIS — Z23 Encounter for immunization: Secondary | ICD-10-CM | POA: Diagnosis not present

## 2022-10-29 ENCOUNTER — Ambulatory Visit: Payer: Medicare Other | Admitting: Physical Therapy

## 2022-10-29 ENCOUNTER — Encounter: Payer: Self-pay | Admitting: Physical Therapy

## 2022-11-01 ENCOUNTER — Ambulatory Visit: Payer: Medicare Other | Admitting: Internal Medicine

## 2022-11-01 ENCOUNTER — Telehealth: Payer: Self-pay | Admitting: Family Medicine

## 2022-11-01 ENCOUNTER — Ambulatory Visit: Payer: Medicare Other | Admitting: Family Medicine

## 2022-11-01 ENCOUNTER — Encounter: Payer: Self-pay | Admitting: Family Medicine

## 2022-11-01 ENCOUNTER — Encounter: Payer: Self-pay | Admitting: Internal Medicine

## 2022-11-01 VITALS — BP 124/82 | HR 64 | Temp 98.0°F | Ht 65.0 in | Wt 133.8 lb

## 2022-11-01 DIAGNOSIS — G894 Chronic pain syndrome: Secondary | ICD-10-CM

## 2022-11-01 DIAGNOSIS — I10 Essential (primary) hypertension: Secondary | ICD-10-CM

## 2022-11-01 DIAGNOSIS — R4189 Other symptoms and signs involving cognitive functions and awareness: Secondary | ICD-10-CM

## 2022-11-01 DIAGNOSIS — H538 Other visual disturbances: Secondary | ICD-10-CM

## 2022-11-01 MED ORDER — PREGABALIN 50 MG PO CAPS
50.0000 mg | ORAL_CAPSULE | Freq: Three times a day (TID) | ORAL | 1 refills | Status: DC
Start: 2022-11-01 — End: 2023-03-11

## 2022-11-01 MED ORDER — TIZANIDINE HCL 2 MG PO TABS
2.0000 mg | ORAL_TABLET | Freq: Four times a day (QID) | ORAL | 1 refills | Status: DC | PRN
Start: 2022-11-01 — End: 2023-03-11

## 2022-11-01 NOTE — Assessment & Plan Note (Signed)
Despite multiple interventions including epidural injections, medial branch block injections, physical therapy, and chiropractic care, his back pain persists. Gabapentin and duloxetine previously provided no significant relief. We will start a trial of Pregabalin (Lyrica) and Tizanidine (muscle relaxer), consider a referral to neuropsychology for further evaluation of pain management strategies, and consider acupuncture as a potential treatment option.

## 2022-11-01 NOTE — Progress Notes (Signed)
[facility]: [phone]   -- Medical Office Visit --  Patient:  Ricky [name].      Age: 77 y.o.       Sex:  male  Date:   11/01/2022 Patient Care Team: Shelva Majestic, MD as PCP - General (Family Medicine) Today's Healthcare Provider: Lula Olszewski, MD      Assessment & Plan Chronic pain syndrome Despite multiple interventions including epidural injections, medial branch block injections, physical therapy, and chiropractic care, his back pain persists. Gabapentin and duloxetine previously provided no significant relief. We will start a trial of Pregabalin (Lyrica) and Tizanidine (muscle relaxer), consider a referral to neuropsychology for further evaluation of pain management strategies, and consider acupuncture as a potential treatment option. Essential hypertension Well-controlled with amlodipine, recently tapered to zero due to low readings, he aims for a blood pressure goal of 120/80. Recent readings have been slightly elevated, likely attributable to white coat hypertension. We will continue current management without amlodipine and maintain home blood pressure monitoring. Brain fog He reports feeling "fuzzy" or "out of it." We will consider a referral to neurology or neuropsychology for further evaluation and consider further testing such as lab work or brain imaging.   He wants to wait and consider these options for now before moving forward with any of them. Blurry vision He reports blurry vision since cataract surgery. We will continue follow-up with ophthalmology.   Diagnoses and all orders for this visit: Essential hypertension Chronic pain syndrome -     tiZANidine (ZANAFLEX) 2 MG tablet; Take 1 tablet (2 mg total) by mouth every 6 (six) hours as needed for muscle spasms. -     pregabalin (LYRICA) 50 MG capsule; Take 1 capsule (50 mg total) by mouth 3 (three) times daily. Brain fog  Recommended follow-up: No follow-ups on file. Future  Appointments  Date Time Provider Department Center  11/06/2022  8:45 AM Rick Duff, PT [facility]  11/13/2022  8:45 AM Parcell, Lyman Speller, PT [facility]  11/19/2022 11:00 AM Delano Metz, MD [facility] None  11/20/2022  8:45 AM Marya Amsler, Lyman Speller, PT [facility]  11/21/2022 11:15 AM Judi Saa, DO [facility] None  11/27/2022  8:45 AM Marya Amsler, Lyman Speller, PT [facility]  12/04/2022  9:30 AM Marya Amsler, Lyman Speller, PT [facility]  12/14/2022 11:00 AM Shelva Majestic, MD [facility]  05/14/2023  9:00 AM [facility] ANNUAL WELLNESS VISIT 1 [facility]            Subjective   77 y.o. male who has OA (osteoarthritis) of knee; Essential hypertension; History of CVA (cerebrovascular accident); Hemiparesis affecting dominant side as late effect of stroke (HCC); Carotid artery stenosis, symptomatic (Right); Trigger middle finger of hand (Left); Hyperlipidemia; Leukopenia; DDD (degenerative disc disease), lumbar; Acquired leg length discrepancy; Emphysema lung (HCC); Hyperglycemia; Senile purpura (HCC); Chronic pain syndrome; Pharmacologic therapy; Disorder of skeletal system; Problems influencing health status; Grade 2 Anterolisthesis (7mm) of lumbar spine (L4/L5); DDD (degenerative disc disease), lumbosacral; Dextroscoliosis of lumbar spine; Abnormal MRI, lumbar spine (05/19/2021); Spondylolisthesis of lumbar region; Grade 1 Retrolisthesis of L1/L2, L2/L3, & L3/L4; Lumbar facet arthropathy (Multilevel) (Bilateral); Lumbar foraminal stenosis (Bilateral: L1-2, L2-3, L3-4, L4-5) (Right - SEVERE: L4-5); Lumbar lateral recess stenosis (Bilateral: L3-4, L4-5) (Right - SEVERE: L4-5); Ligamentum flavum hypertrophy (L3-4, L4-5, L5-S1); and Chronic low back pain (1ry area of Pain) (Bilateral) w/o sciatica on their problem list. His reasons/main concerns/chief complaints for today's office visit are 2 month follow-up    ------------------------------------------------------------------------------------------------------------------------  AI-Extracted: Discussed the use of AI scribe software for clinical note transcription with the patient, who gave verbal consent to proceed.  History of Present Illness   The patient, known as Ricky [name], presents with a history of hypertension, which has been well-controlled through self-monitoring and medication adjustments. The patient has been diligent in tracking blood pressure readings at home and has noticed a trend of initial high readings at the doctor's office, which decrease upon subsequent measurements, suggesting possible white coat hypertension. The patient's goal is to maintain an average blood pressure of 120/80, which appears to be largely achieved based on home readings.  In addition to hypertension, the patient has been experiencing significant back pain for approximately two years. Despite undergoing two epidural injections, four medial branch block injections, and one additional epidural injection, the patient reports no significant relief. The patient has also engaged in physical therapy, aquatic therapy, and chiropractic care, but continues to experience pain, particularly during activities such as yard work, cooking, and house cleaning.  The patient also reports issues with vision following cataract surgery earlier in the year. The patient describes a "fuzzy brain" feeling and has expressed interest in a neurological evaluation to further investigate this issue.  The patient has previously tried gabapentin and duloxetine for pain management, but discontinued due to lack of perceived benefit and concerns about side effects. The patient is open to trying new medications and has been prescribed Pregabalin and Tizanidine to manage nerve pain and muscle spasms, respectively.  The patient is proactive in managing his health and is keen to explore all available  treatment options.      He has a past medical history of Arthritis, Carotid artery occlusion, Complication of anesthesia, Hyperlipidemia (03/08/2016), Hypertension, Peripheral vascular disease (HCC), Pneumonia, and Stroke (HCC).  Problem list overviews that were updated at today's visit: No problems updated. Current Outpatient Medications on File Prior to Visit  Medication Sig   acetaminophen (TYLENOL) 500 MG tablet Take 500-1,000 mg by mouth every 6 (six) hours as needed (for pain.).    albuterol (VENTOLIN HFA) 108 (90 Base) MCG/ACT inhaler Inhale 2 puffs into the lungs every 6 (six) hours as needed for wheezing or shortness of breath.   aspirin EC 81 MG tablet Take 81 mg by mouth daily. Swallow whole.   atorvastatin (LIPITOR) 20 MG tablet TAKE 1 TABLET BY MOUTH EVERY OTHER DAY   Multiple Vitamin (MULTIVITAMIN WITH MINERALS) TABS tablet Take 1 tablet by mouth daily.   No current facility-administered medications on file prior to visit.  There are no discontinued medications.   Objective   Physical Exam  BP 124/82 (BP Location: Right Arm, Patient Position: Sitting)   Pulse 64   Temp 98 F (36.7 C) (Temporal)   Ht 5\' 5"  (1.651 m)   Wt 133 lb 12.8 oz (60.7 kg)   SpO2 98%   BMI 22.27 kg/m  Wt Readings from Last 10 Encounters:  11/01/22 133 lb 12.8 oz (60.7 kg)  10/24/22 132 lb (59.9 kg)  08/28/22 135 lb (61.2 kg)  08/01/22 135 lb 4 oz (61.3 kg)  06/28/22 137 lb (62.1 kg)  05/08/22 139 lb 6.4 oz (63.2 kg)  04/27/22 137 lb (62.1 kg)  04/11/22 138 lb 3.2 oz (62.7 kg)  03/05/22 141 lb 9.6 oz (64.2 kg)  02/26/22 139 lb (63 kg)   Vital signs reviewed.  Nursing notes reviewed. Weight trend reviewed. He brought home blood pressure extensive which will be scanned in later but showed mostly great blood  pressure at home General Appearance:  No acute distress appreciable.   Well-groomed, healthy-appearing male.  Well proportioned with no abnormal fat distribution.  Good muscle  tone. Pulmonary:  Normal work of breathing at rest, no respiratory distress apparent. SpO2: 98 %  Musculoskeletal: All extremities are intact.  Neurological:  Awake, alert, oriented, and engaged.  No obvious focal neurological deficits or cognitive impairments.  Sensorium seems unclouded.   Speech is clear and coherent with logical content. Psychiatric:  Appropriate mood, pleasant and cooperative demeanor, thoughtful and engaged during the exam  Results   RADIOLOGY Spine X-ray: Unchanged on flexion and extension views Lumbar spine MRI: Focus on L4, L5        No results found for any visits on 11/01/22.  Office Visit on 08/01/2022  Component Date Value   WBC 08/01/2022 4.6    RBC 08/01/2022 4.13 (L)    Hemoglobin 08/01/2022 12.8 (L)    HCT 08/01/2022 38.7 (L)    MCV 08/01/2022 93.8    MCHC 08/01/2022 33.0    RDW 08/01/2022 14.8    Platelets 08/01/2022 254.0    Neutrophils Relative % 08/01/2022 63.5    Lymphocytes Relative 08/01/2022 25.0    Monocytes Relative 08/01/2022 8.3    Eosinophils Relative 08/01/2022 2.0    Basophils Relative 08/01/2022 1.2    Neutro Abs 08/01/2022 2.9    Lymphs Abs 08/01/2022 1.1    Monocytes Absolute 08/01/2022 0.4    Eosinophils Absolute 08/01/2022 0.1    Basophils Absolute 08/01/2022 0.1    TSH 08/01/2022 2.45    Vitamin B-12 08/01/2022 424    Sed Rate 08/01/2022 7    Sodium 08/01/2022 142    Potassium 08/01/2022 4.2    Chloride 08/01/2022 108    CO2 08/01/2022 26    Glucose, Bld 08/01/2022 110 (H)    BUN 08/01/2022 21    Creatinine, Ser 08/01/2022 0.76    Total Bilirubin 08/01/2022 0.4    Alkaline Phosphatase 08/01/2022 57    AST 08/01/2022 24    ALT 08/01/2022 18    Total Protein 08/01/2022 6.0    Albumin 08/01/2022 3.8    GFR 08/01/2022 85.99    Calcium 08/01/2022 9.0    Hgb A1c MFr Bld 08/01/2022 5.9    CRP 08/01/2022 <1.0   Office Visit on 02/26/2022  Component Date Value   WBC 02/26/2022 5.2    RBC 02/26/2022 4.19 (L)     Hemoglobin 02/26/2022 13.3    HCT 02/26/2022 39.8    MCV 02/26/2022 95.1    MCHC 02/26/2022 33.5    RDW 02/26/2022 13.7    Platelets 02/26/2022 298.0    Neutrophils Relative % 02/26/2022 66.3    Lymphocytes Relative 02/26/2022 19.1    Monocytes Relative 02/26/2022 11.0    Eosinophils Relative 02/26/2022 3.2    Basophils Relative 02/26/2022 0.4    Neutro Abs 02/26/2022 3.4    Lymphs Abs 02/26/2022 1.0    Monocytes Absolute 02/26/2022 0.6    Eosinophils Absolute 02/26/2022 0.2    Basophils Absolute 02/26/2022 0.0    Sodium 02/26/2022 141    Potassium 02/26/2022 4.2    Chloride 02/26/2022 105    CO2 02/26/2022 28    Glucose, Bld 02/26/2022 103 (H)    BUN 02/26/2022 25 (H)    Creatinine, Ser 02/26/2022 0.70    Total Bilirubin 02/26/2022 0.6    Alkaline Phosphatase 02/26/2022 61    AST 02/26/2022 29    ALT 02/26/2022 23    Total Protein 02/26/2022 6.1  Albumin 02/26/2022 3.8    GFR 02/26/2022 88.42    Calcium 02/26/2022 9.0    Cholesterol 02/26/2022 115    Triglycerides 02/26/2022 48.0    HDL 02/26/2022 57.90    VLDL 02/26/2022 9.6    LDL Cholesterol 02/26/2022 47    Total CHOL/HDL Ratio 02/26/2022 2    NonHDL 02/26/2022 56.75    Hgb A1c MFr Bld 02/26/2022 5.9    No image results found.   DG Lumbar Spine Complete W/Bend  Result Date: 10/24/2022 CLINICAL DATA:  Low back pain for several years. EXAM: LUMBAR SPINE - COMPLETE WITH BENDING VIEWS COMPARISON:  February 09, 2021 FINDINGS: There is no evidence of lumbar spine fracture. Grade 2 anterolisthesis of L4 on L5 is noted. Degree of anterolisthesis at L4-5 is unchanged on flexion and extension views. Degenerative joint changes throughout spine with narrow intervertebral space osteophyte formation and vacuum disc phenomenon are noted. Extensive bowel content identified throughout colon. IMPRESSION: Grade 2 anterolisthesis of L4 on L5. Degree of anterolisthesis at L4-5 is unchanged on flexion and extension views. Electronically  Signed   By: Sherian Rein M.D.   On: 10/24/2022 13:45   DG INJECT DIAG/THERA/INC NEEDLE/CATH/PLC EPI/LUMB/SAC W/IMG  Result Date: 08/16/2022 CLINICAL DATA:  Lumbosacral spondylosis without myelopathy. Right-sided low back pain. Advanced facet arthropathy at L4-L5 with anterolisthesis and severe spinal canal stenosis. No improvement after bilateral L4-L5 facet medial branch blocks. FLUOROSCOPY: Radiation Exposure Index (as provided by the fluoroscopic device): 2.2 mGy Kerma PROCEDURE: The procedure, risks, benefits, and alternatives were explained to the patient. Questions regarding the procedure were encouraged and answered. The patient understands and consents to the procedure. LUMBAR EPIDURAL INJECTION: An interlaminar approach was performed on the right at L5-S1. The overlying skin was cleansed and anesthetized. A 3.5 inch 20 gauge epidural needle was advanced using loss-of-resistance technique. DIAGNOSTIC EPIDURAL INJECTION: Injection of Isovue-M 200 shows a good epidural pattern with spread above and below the level of needle placement, primarily on the right. No vascular opacification is seen. THERAPEUTIC EPIDURAL INJECTION: 80 mg of Depo-Medrol mixed with 3 mL of 1% lidocaine were instilled. The procedure was well-tolerated, and the patient was discharged thirty minutes following the injection in good condition. COMPLICATIONS: None immediate. IMPRESSION: Technically successful interlaminar epidural injection on the right at L5-S1. Electronically Signed   By: Obie Dredge M.D.   On: 08/16/2022 09:52    DG Lumbar Spine Complete W/Bend  Result Date: 10/24/2022 CLINICAL DATA:  Low back pain for several years. EXAM: LUMBAR SPINE - COMPLETE WITH BENDING VIEWS COMPARISON:  February 09, 2021 FINDINGS: There is no evidence of lumbar spine fracture. Grade 2 anterolisthesis of L4 on L5 is noted. Degree of anterolisthesis at L4-5 is unchanged on flexion and extension views. Degenerative joint changes throughout  spine with narrow intervertebral space osteophyte formation and vacuum disc phenomenon are noted. Extensive bowel content identified throughout colon. IMPRESSION: Grade 2 anterolisthesis of L4 on L5. Degree of anterolisthesis at L4-5 is unchanged on flexion and extension views. Electronically Signed   By: Sherian Rein M.D.   On: 10/24/2022 13:45       Additional Info: This encounter employed real-time, collaborative documentation. The patient actively reviewed and updated their medical record on a shared screen, ensuring transparency and facilitating joint problem-solving for the problem list, overview, and plan. This approach promotes accurate, informed care. The treatment plan was discussed and reviewed in detail, including medication safety, potential side effects, and all patient questions. We confirmed understanding and comfort with the  plan. Follow-up instructions were established, including contacting the office for any concerns, returning if symptoms worsen, persist, or new symptoms develop, and precautions for potential emergency department visits.

## 2022-11-01 NOTE — Patient Instructions (Addendum)
Keep doing what your doing on blood pressure.  Please learn about neurology and neuropsychology to help you decide about testing for brain fog.  Labwork is also an option and brain scans.   Pregabalin helps nerve pain, give it a try Tizanidine relaxes muscles give it a try Both cause slight sedation so don't try before driving.  VISIT SUMMARY:  Ricky [name], during your recent visit, we discussed your ongoing issues with hypertension, chronic back pain, cognitive concerns, and vision problems. We noted that your hypertension is well-controlled, and we will continue to monitor it at home. Your back pain remains a concern, and we will try new medications and consider additional treatment options. We will also seek further evaluation for your cognitive concerns and continue to monitor your vision following your cataract surgery.  YOUR PLAN:  -HYPERTENSION: Your high blood pressure is well-managed, and we will continue to monitor it at home without the need for amlodipine. Hypertension is a condition where the force of the blood against the artery walls is too high.  -CHRONIC BACK PAIN: Despite various treatments, your back pain persists. We will try new medications, Pregabalin and Tizanidine, and consider additional treatments like acupuncture. Chronic back pain is a long-term discomfort in the back.  -COGNITIVE CONCERNS: You've reported feeling 'fuzzy' or 'out of it.' We will consider referring you to a specialist for further evaluation and possibly conduct more tests. Cognitive concerns refer to issues with mental processes like memory and thinking.  -VISION PROBLEMS: You've reported blurry vision since your cataract surgery. We will continue to follow up with your eye doctor. Vision problems refer to any issues with your ability to see.  INSTRUCTIONS:  Please continue to monitor your blood pressure at home and take the new medications as prescribed for your back pain. We will be in touch  regarding potential referrals for your cognitive concerns and any additional tests that may be needed. Please also continue to follow up with your eye doctor regarding your vision.  It was a pleasure seeing you today! Your health and satisfaction are our top priorities.   Ricky Hew, MD  Next Steps:  [x]  Flexible Follow-Up: We recommend a follow up with your primary care, a specialist, or me within 1-2 weeks for ensuring your problem resolves. This allows for progress monitoring and treatment adjustments.  We have given enough medication(s) to get to your next regular scheduled appointment with Dr. Durene Cal. Future Appointments  Date Time Provider Department Center  11/06/2022  8:45 AM Rick Duff, PT [facility]  11/13/2022  8:45 AM Marya Amsler, Lyman Speller, PT [facility]  11/19/2022 11:00 AM Delano Metz, MD [facility] None  11/20/2022  8:45 AM Marya Amsler, Lyman Speller, PT [facility]  11/21/2022 11:15 AM Judi Saa, DO [facility] None  11/27/2022  8:45 AM Marya Amsler, Lyman Speller, PT [facility]  12/04/2022  9:30 AM Marya Amsler, Lyman Speller, PT [facility]  12/14/2022 11:00 AM Shelva Majestic, MD [facility]  05/14/2023  9:00 AM [facility] ANNUAL WELLNESS VISIT 1 [facility]     [x]  Early Intervention: Schedule sooner appointment, call our on-call services, or go to emergency room if there is Increase in pain or discomfort New or worsening symptoms Sudden or severe changes in your health [x]  Lab & X-ray Appointments: complete or schedule to complete today, or call to schedule.  X-rays: [facility] (M-F, 8:30am-noon or 1pm-5pm).  Making the Most of Our Focused (20 minute) Follow Up Appointments:  [x]   Clearly state your top concerns at the beginning of the visit to focus our discussion [x]   If you anticipate you will need more time, please inform the front desk during scheduling - we can book multiple appointments in the same week. [x]   If you have transportation  problems- use our convenient video appointments or ask about transportation support. [x]   We can get down to business faster if you use MyChart to update information before the visit and submit non-urgent questions before your visit. Thank you for taking the time to provide details through MyChart.  Let our nurse know and she can import this information into your encounter documents.  Arrival and Wait Times: [x]   Arriving on time ensures that everyone receives prompt attention. [x]   Early morning (8a) and afternoon (1p) appointments tend to have shortest wait times. [x]   Unfortunately, we cannot delay appointments for late arrivals or hold slots during phone calls.  Getting Answers and Following Up  [x]   Simple Questions & Concerns: For quick questions or basic follow-up after your visit, reach [place] at [phone] or MyChart messaging. [x]   Complex Concerns: If your concern is more complex, scheduling an appointment might be best. Discuss this with the staff to find the most suitable option. [x]   Lab & Imaging Results: We'll contact you directly if results are abnormal or you don't use MyChart. Most normal results will be on MyChart within 2-3 business days, with a review message from Dr. Jon Billings. Haven't heard back in 2 weeks? Need results sooner? Contact us at [phone]. [x]   Referrals: Our referral coordinator will manage specialist referrals. The specialist's office should contact you within 2 weeks to schedule an appointment. Call us if you haven't heard from them after 2 weeks.  Staying Connected  [x]   MyChart: Activate your MyChart for the fastest way to access results and message [place]. See the last page of this paperwork for instructions on how to activate.  Bring to Your Next Appointment  [x]   Medications: Please bring all your medication bottles to your next appointment to ensure we have an accurate record of your prescriptions. [x]   Health Diaries: If you're monitoring any  health conditions at home, keeping a diary of your readings can be very helpful for discussions at your next appointment.  Billing  [x]   X-ray & Lab Orders: These are billed by separate companies. Contact the invoicing company directly for questions or concerns. [x]   Visit Charges: Discuss any billing inquiries with our administrative services team.  Your Satisfaction Matters  [x]   Share Your Experience: We strive for your satisfaction! If you have any complaints, or preferably compliments, please let Dr. Jon Billings know directly or contact our Practice Administrators, Edwena Felty or [employer], by asking at the front desk.   Reviewing Your Records  [x]   Review this early draft of your clinical encounter notes below and the final encounter summary tomorrow on MyChart after its been completed.   Essential hypertension  Chronic pain syndrome -     tiZANidine HCl; Take 1 tablet (2 mg total) by mouth every 6 (six) hours as needed for muscle spasms.  Dispense: 100 tablet; Refill: 1 -     Pregabalin; Take 1 capsule (50 mg total) by mouth 3 (three) times daily.  Dispense: 90 capsule; Refill: 1  Brain fog

## 2022-11-01 NOTE — Assessment & Plan Note (Signed)
Well-controlled with amlodipine, recently tapered to zero due to low readings, he aims for a blood pressure goal of 120/80. Recent readings have been slightly elevated, likely attributable to white coat hypertension. We will continue current management without amlodipine and maintain home blood pressure monitoring.

## 2022-11-01 NOTE — Telephone Encounter (Signed)
Per pt acupuncture was discussed with provider . Do you have a list of clinics he can visit for this ?  If so patient would like details sent through mychart .

## 2022-11-05 DIAGNOSIS — Z961 Presence of intraocular lens: Secondary | ICD-10-CM | POA: Diagnosis not present

## 2022-11-06 ENCOUNTER — Encounter: Payer: Self-pay | Admitting: Rehabilitation

## 2022-11-06 ENCOUNTER — Ambulatory Visit: Payer: Medicare Other | Attending: Family Medicine | Admitting: Rehabilitation

## 2022-11-06 DIAGNOSIS — M6281 Muscle weakness (generalized): Secondary | ICD-10-CM | POA: Insufficient documentation

## 2022-11-06 DIAGNOSIS — M5459 Other low back pain: Secondary | ICD-10-CM | POA: Insufficient documentation

## 2022-11-06 NOTE — Therapy (Signed)
OUTPATIENT PHYSICAL THERAPY NEURO TREATMENT   Patient Name: Ricky [name]. MRN: [id number] DOB:05/29/1945, 77 y.o., male Today's Date: 11/06/2022   PCP: Shelva Majestic., MD REFERRING PROVIDER: Judi Saa, DO  END OF SESSION:  PT End of Session - 11/06/22 0754     Visit Number 5    Number of Visits 9    Date for PT Re-Evaluation 11/30/22   pushed out due to schedule availability   Authorization Type Medicare/BCBS    Authorization Time Period 09-17-22 - 12-06-22    PT Start Time 0830    PT Stop Time 0915    PT Time Calculation (min) 45 min    Equipment Utilized During Treatment Other (comment)   bar bells (small, multi-colored)   Activity Tolerance Patient tolerated treatment well    Behavior During Therapy [facility] for tasks assessed/performed             Past Medical History:  Diagnosis Date   Arthritis    Carotid artery occlusion    Complication of anesthesia    slow to wake up after shoulder surgeries   Hyperlipidemia 03/08/2016   LDL 75 pre stroke. Goal <70 so on atorvastatin 20mg  every other day   Hypertension    Peripheral vascular disease (HCC)    Pneumonia    Stroke [facility])    still has decreased sensation in left hand and arm (as of 03/09/16)   Past Surgical History:  Procedure Laterality Date   CAROTID ENDARTERECTOMY     CATARACT EXTRACTION W/ INTRAOCULAR LENS IMPLANT Bilateral 2024   ENDARTERECTOMY Right 03/19/2016   Procedure: ENDARTERECTOMY CAROTID;  Surgeon: Sherren Kerns, MD;  Location: [facility] OR;  Service: Vascular;  Laterality: Right;   PATCH ANGIOPLASTY Right 03/19/2016   Procedure: PATCH ANGIOPLASTY OF RIGHT CAROTID USING HEMASHIELD PLATINUM FINESSE;  Surgeon: Sherren Kerns, MD;  Location: [facility] OR;  Service: Vascular;  Laterality: Right;   SHOULDER SURGERY Bilateral    left- injections intermittent now  Rotator cuff repair on both shoulders, 2 different surgeries   TONSILLECTOMY     TOTAL KNEE ARTHROPLASTY Left 12/14/2013   Procedure:  LEFT TOTAL KNEE ARTHROPLASTY;  Surgeon: Loanne Drilling, MD;  Location: [facility] ORS;  Service: Orthopedics;  Laterality: Left;   Patient Active Problem List   Diagnosis Date Noted   Grade 1 Retrolisthesis of L1/L2, L2/L3, & L3/L4 10/25/2022   Lumbar facet arthropathy (Multilevel) (Bilateral) 10/25/2022   Lumbar foraminal stenosis (Bilateral: L1-2, L2-3, L3-4, L4-5) (Right - SEVERE: L4-5) 10/25/2022   Lumbar lateral recess stenosis (Bilateral: L3-4, L4-5) (Right - SEVERE: L4-5) 10/25/2022   Ligamentum flavum hypertrophy (L3-4, L4-5, L5-S1) 10/25/2022   Chronic low back pain (1ry area of Pain) (Bilateral) w/o sciatica 10/25/2022   Grade 2 Anterolisthesis (7mm) of lumbar spine (L4/L5) 10/24/2022   DDD (degenerative disc disease), lumbosacral 10/24/2022   Dextroscoliosis of lumbar spine 10/24/2022   Abnormal MRI, lumbar spine (05/19/2021) 10/24/2022   Spondylolisthesis of lumbar region 10/24/2022   Chronic pain syndrome 10/22/2022   Pharmacologic therapy 10/22/2022   Disorder of skeletal system 10/22/2022   Problems influencing health status 10/22/2022   Emphysema lung (HCC) 08/25/2021   Hyperglycemia 08/25/2021   Senile purpura (HCC) 08/25/2021   DDD (degenerative disc disease), lumbar 02/09/2021   Acquired leg length discrepancy 02/09/2021   Leukopenia 01/08/2017   Trigger middle finger of hand (Left) 11/12/2016   Carotid artery stenosis, symptomatic (Right) 03/19/2016   Hemiparesis affecting dominant side as late effect of stroke (HCC) 03/08/2016  Hyperlipidemia 03/08/2016   History of CVA (cerebrovascular accident) 02/28/2016   Essential hypertension 12/20/2014   OA (osteoarthritis) of knee 12/14/2013    ONSET DATE: Referral date   REFERRING DIAG: M51.36 (ICD-10-CM) - DDD (degenerative disc disease), lumbar  THERAPY DIAG:  Other low back pain  Muscle weakness (generalized)  Rationale for Evaluation and Treatment: Rehabilitation  SUBJECTIVE:                                                                                                                                                                                              SUBJECTIVE STATEMENT:      Pt returns after 1-2 weeks off.  Missed last week due to getting COVID and Flu vaccine and feeling unwell afterwards.  Has tried to go to the pool 2 times and did some of the exercises.  Wants to know which 3-4 to focus on if pressed for time.    Pt accompanied by: self  PERTINENT HISTORY: Lt TKA 12/2013;  CVA Feb. 2018 with LUE residual sensation deficits:  HTN: PVD:  Emphysema lung   PAIN:    Are you having pain? Yes: NPRS scale: 2-3/10 Pain description: throbbing Aggravating factors: strenuous activity - like yard work or housework  Relieving factors: sitting or lying down helps to relieve  Location:  Low back/lumbar - pt reports slightly more pain on Rt side than on Lt side of low back   PRECAUTIONS: None  RED FLAGS: None   WEIGHT BEARING RESTRICTIONS: No  FALLS: Has patient fallen in last 6 months? No  LIVING ENVIRONMENT: Lives with: lives with their spouse Lives in: House/apartment Stairs: Yes: Internal: 12 steps; on right going up Has following equipment at home: None  PLOF: Independent  PATIENT GOALS: learn aquatic exercises to do independently  OBJECTIVE:   DIAGNOSTIC FINDINGS: None recently  COGNITION: Overall cognitive status: Within functional limits for tasks assessed   SENSATION: Impaired LUE due to residual effect of CVA in 2018  COORDINATION: WNL's  POSTURE:  increased forward flexion as back pain increases - pt reports pain is less when he leans forward  LOWER EXTREMITY ROM:   WNL's bil. LE's   LOWER EXTREMITY MMT:    MMT Right Eval Left Eval  Hip flexion 4 5  Hip extension    Hip abduction    Hip adduction    Hip internal rotation    Hip external rotation    Knee flexion 4 4+  Knee extension 5 5  Ankle dorsiflexion 4- 5  Ankle plantarflexion    Ankle  inversion    Ankle eversion    (Blank rows =  not tested)  BED MOBILITY:  Independent  TRANSFERS: Assistive device utilized: None  Sit to stand: Complete Independence Stand to sit: Complete Independence  STAIRS: Level of Assistance: Modified independence Stair Negotiation Technique: Alternating Pattern  with Single Rail on Right Number of Stairs: 4  Height of Stairs: 6  Comments: step over step sequence  GAIT: Gait pattern:  increased Rt foot slap in stance due to weak eccentric dorsiflexors and step through pattern Distance walked: 115' Assistive device utilized: None Level of assistance: Modified independence Comments: increased forward flexed posture as back pain increases  FUNCTIONAL TESTS:  5 times sit to stand: 8.40 secs without UE support from standard chair Timed up and go (TUG): 10.82 secs 10 meter walk test: 12.72 ft = 2.58 ft/sec  PATIENT SURVEYS:  N/A - dx is back pain  TODAY'S TREATMENT:                                                                                                                              DATE: 11-06-22    Patient seen for aquatic therapy today.  Treatment took place in water 3.6-4.0 feet deep depending upon activity.  Pt entered and exited the pool via stairs using B rails in reciprocal pattern.  Pool temp approx 92 deg.   Warm up:  Walking forwards x 18' x 4 laps, backwards x 4 laps an side stepping (holding yellow barbells by side) without UE support, but continue to focus on posture.    Forward marching holding small barbells x 2 laps adding in opposite arm motions x 2 more laps.  Pt better able to maintain upright posture today with intermittent cuing as needed.    Standing with large barbells for support,  hip flex/ext with knee ext x 10 reps each side.  Intermittent support from PT with cues for improved stance leg activation and posture throughout.  For more core activation, had pt utilize flat hands and move in quick alt flex/ext x  3 sets of 15 secs.  Also had him try with smaller bar bells to feel difference.  He is able to do but tends to separate feet with increased reps.  Attempted sitting "saddle" sit on noodle today.  Utilized wall for support and PT providing min A.  Worked on maintaining balance x several reps.  With single UE assist on wall, performed LAQs x 10 reps, forward bicycles x 10, backwards bicycles x 10 reps.  Had him move from wall with support from large yellow barbells and min A from PT to bicycle across pool x 2 reps.  Min cues throughout for mild trunk changes (either ant/post).   Pt tolerated well.     Sit<>stand from third step with feet on first step x 10 reps without UE support with cues for upright posture at top (began with arms forward then with standing transition arms to side).  Forward steps ups with opposite march x 10 reps on each side  with single UE support on rail with cues for posture and improved alignment when in full stance.  Also cues for decreasing amount of UE "pulling" to get onto step.    Ai Chi modified "Balancing"  with single UE support x 10 reps with each side.  "Accepting" and "Soothing" x 10 reps each with improvement in balance noted with min cues for relaxing shoulders.      Pt requires buoyancy of water for support for reduced fall risk and for unloading/reduced stress on joints (spine) as pt able to tolerate increased standing and ambulation in water compared to that on land; viscosity of water is needed for resistance for strengthening and current of water provides perturbations for challenge for balance training      PATIENT EDUCATION: Education details: aquatic rationale, updated HEP  Person educated: Patient Education method: Explanation Education comprehension: verbalized understanding  HOME EXERCISE PROGRAM: Access Code: [email or web address] URL: [email or web address] Date: 10/16/2022 Prepared by: Harriet Butte  Exercises - Forward and Backward Stepping  at [facility]  - 1 x daily - 7 x weekly - 1 sets - 2 reps - Lateral Stepping at [facility] Wall  - 1 x daily - 7 x weekly - 1 sets - 2 reps - Forward March with Opposite Arm Knee Taps and Hand Floats  - 1 x daily - 7 x weekly - 1 sets - 2 reps - Standing Hip Flexion Extension at [employer]  - 1 x daily - 7 x weekly - 1 sets - 10 reps - Standing Hip Circles at [employer]  - 1 x daily - 7 x weekly - 3 sets - 10 reps  GOALS: Goals reviewed with patient? Yes  SHORT TERM GOALS: Target date: 10-26-22  Pt will participate in aquatic PT session with c/o LBP </= 2/10 at end of session. Baseline: Goal status: INITIAL  2.  Pt will amb.10" nonstop in pool in various directions and maintain upright posture at least 75% time, I.e. at least 8". Baseline:  Goal status: INITIAL  3.  Pt will subjectively report increased relief in low back pain by experiencing longer duration prior to onset of back pain. Baseline:  Goal status: INITIAL  4.  Pt will be independent in basic aquatic HEP. Baseline:  Goal status: INITIAL    LONG TERM GOALS: Target date: 11-23-22  Pt will participate in aquatic PT session with c/o LBP </= 1/10 at end of session. Baseline:  Goal status: INITIAL  2.  Pt will subjectively report improvement in low back pain and functional status by reporting ability to carry a 5# object in his home at least 20' without experiencing back pain. Baseline:  Goal status: INITIAL  3.  Pt will negotiate steps in pool area with use of handrails using step over step sequence with supervision without report of increased back pain. Baseline:  Goal status: INITIAL  4.  Independent in updated aquatic HEP to include Ai Chi postures. Baseline:  Goal status: INITIAL   ASSESSMENT:  CLINICAL IMPRESSION: Pt able to progress with Ai Chi and balance exercises today, needing less external support.  He still needs intermittent cuing for posture but does better with self correcting.  We were able to initiate  sitting on pool noodle today for continued core strengthening which he tolerated well.    OBJECTIVE IMPAIRMENTS: difficulty walking, decreased strength, and pain.   ACTIVITY LIMITATIONS: carrying, bending, standing, squatting, stairs, and locomotion level  PARTICIPATION LIMITATIONS: cleaning, shopping, community activity, and yard work  PERSONAL  FACTORS: Age, Past/current experiences, Time since onset of injury/illness/exacerbation, and 1 comorbidity: h/o lumbar DDD  are also affecting patient's functional outcome.   REHAB POTENTIAL: Good  CLINICAL DECISION MAKING: Stable/uncomplicated  EVALUATION COMPLEXITY: Low  PLAN:  PT FREQUENCY: 1x/week  PT DURATION: 8 weeks + eval = 9 visits  PLANNED INTERVENTIONS: Therapeutic exercises, Therapeutic activity, Neuromuscular re-education, Balance training, Gait training, Patient/Family education, Self Care, and Aquatic Therapy  PLAN FOR NEXT SESSION:  Add balancing to HEP   Harriet Butte, PT, MPT [facility] [address] [place], [place], [address] Phone: [phone]   Fax:  [phone] 11/06/22, 9:18 AM

## 2022-11-13 ENCOUNTER — Ambulatory Visit: Payer: Medicare Other | Admitting: Rehabilitation

## 2022-11-13 ENCOUNTER — Ambulatory Visit: Payer: Self-pay | Admitting: Rehabilitation

## 2022-11-13 ENCOUNTER — Encounter: Payer: Self-pay | Admitting: Rehabilitation

## 2022-11-13 DIAGNOSIS — M6281 Muscle weakness (generalized): Secondary | ICD-10-CM | POA: Diagnosis not present

## 2022-11-13 DIAGNOSIS — M5459 Other low back pain: Secondary | ICD-10-CM | POA: Diagnosis not present

## 2022-11-13 NOTE — Therapy (Signed)
OUTPATIENT PHYSICAL THERAPY NEURO TREATMENT   Patient Name: Ricky [name]. MRN: [id number] DOB:09-08-1945, 77 y.o., male Today's Date: 11/13/2022   PCP: Shelva Majestic., MD REFERRING PROVIDER: Judi Saa, DO  END OF SESSION:  PT End of Session - 11/13/22 0816     Visit Number 6    Number of Visits 9    Date for PT Re-Evaluation 11/30/22   pushed out due to schedule availability   Authorization Type Medicare/BCBS    Authorization Time Period 09-17-22 - 12-06-22    PT Start Time 1017    PT Stop Time 1101    PT Time Calculation (min) 44 min    Equipment Utilized During Treatment Other (comment)   bar bells (small, multi-colored)   Activity Tolerance Patient tolerated treatment well    Behavior During Therapy [facility] for tasks assessed/performed             Past Medical History:  Diagnosis Date   Arthritis    Carotid artery occlusion    Complication of anesthesia    slow to wake up after shoulder surgeries   Hyperlipidemia 03/08/2016   LDL 75 pre stroke. Goal <70 so on atorvastatin 20mg  every other day   Hypertension    Peripheral vascular disease (HCC)    Pneumonia    Stroke [facility])    still has decreased sensation in left hand and arm (as of 03/09/16)   Past Surgical History:  Procedure Laterality Date   CAROTID ENDARTERECTOMY     CATARACT EXTRACTION W/ INTRAOCULAR LENS IMPLANT Bilateral 2024   ENDARTERECTOMY Right 03/19/2016   Procedure: ENDARTERECTOMY CAROTID;  Surgeon: Sherren Kerns, MD;  Location: [facility] OR;  Service: Vascular;  Laterality: Right;   PATCH ANGIOPLASTY Right 03/19/2016   Procedure: PATCH ANGIOPLASTY OF RIGHT CAROTID USING HEMASHIELD PLATINUM FINESSE;  Surgeon: Sherren Kerns, MD;  Location: [facility] OR;  Service: Vascular;  Laterality: Right;   SHOULDER SURGERY Bilateral    left- injections intermittent now  Rotator cuff repair on both shoulders, 2 different surgeries   TONSILLECTOMY     TOTAL KNEE ARTHROPLASTY Left 12/14/2013   Procedure:  LEFT TOTAL KNEE ARTHROPLASTY;  Surgeon: Loanne Drilling, MD;  Location: [facility] ORS;  Service: Orthopedics;  Laterality: Left;   Patient Active Problem List   Diagnosis Date Noted   Grade 1 Retrolisthesis of L1/L2, L2/L3, & L3/L4 10/25/2022   Lumbar facet arthropathy (Multilevel) (Bilateral) 10/25/2022   Lumbar foraminal stenosis (Bilateral: L1-2, L2-3, L3-4, L4-5) (Right - SEVERE: L4-5) 10/25/2022   Lumbar lateral recess stenosis (Bilateral: L3-4, L4-5) (Right - SEVERE: L4-5) 10/25/2022   Ligamentum flavum hypertrophy (L3-4, L4-5, L5-S1) 10/25/2022   Chronic low back pain (1ry area of Pain) (Bilateral) w/o sciatica 10/25/2022   Grade 2 Anterolisthesis (7mm) of lumbar spine (L4/L5) 10/24/2022   DDD (degenerative disc disease), lumbosacral 10/24/2022   Dextroscoliosis of lumbar spine 10/24/2022   Abnormal MRI, lumbar spine (05/19/2021) 10/24/2022   Spondylolisthesis of lumbar region 10/24/2022   Chronic pain syndrome 10/22/2022   Pharmacologic therapy 10/22/2022   Disorder of skeletal system 10/22/2022   Problems influencing health status 10/22/2022   Emphysema lung (HCC) 08/25/2021   Hyperglycemia 08/25/2021   Senile purpura (HCC) 08/25/2021   DDD (degenerative disc disease), lumbar 02/09/2021   Acquired leg length discrepancy 02/09/2021   Leukopenia 01/08/2017   Trigger middle finger of hand (Left) 11/12/2016   Carotid artery stenosis, symptomatic (Right) 03/19/2016   Hemiparesis affecting dominant side as late effect of stroke (HCC) 03/08/2016  Hyperlipidemia 03/08/2016   History of CVA (cerebrovascular accident) 02/28/2016   Essential hypertension 12/20/2014   OA (osteoarthritis) of knee 12/14/2013    ONSET DATE: Referral date   REFERRING DIAG: M51.36 (ICD-10-CM) - DDD (degenerative disc disease), lumbar  THERAPY DIAG:  Other low back pain  Muscle weakness (generalized)  Rationale for Evaluation and Treatment: Rehabilitation  SUBJECTIVE:                                                                                                                                                                                              SUBJECTIVE STATEMENT:      Pt reports moderate pain today, went to gym yesterday and reports maybe he did something then.  Wasn't sure.    Pt accompanied by: self  PERTINENT HISTORY: Lt TKA 12/2013;  CVA Feb. 2018 with LUE residual sensation deficits:  HTN: PVD:  Emphysema lung   PAIN:    Are you having pain? Yes: NPRS scale: 5/10 Pain description: throbbing Aggravating factors: strenuous activity - like yard work or housework  Relieving factors: sitting or lying down helps to relieve  Location:  Low back/lumbar - pt reports slightly more pain on Rt side than on Lt side of low back   PRECAUTIONS: None  RED FLAGS: None   WEIGHT BEARING RESTRICTIONS: No  FALLS: Has patient fallen in last 6 months? No  LIVING ENVIRONMENT: Lives with: lives with their spouse Lives in: House/apartment Stairs: Yes: Internal: 12 steps; on right going up Has following equipment at home: None  PLOF: Independent  PATIENT GOALS: learn aquatic exercises to do independently  OBJECTIVE:   DIAGNOSTIC FINDINGS: None recently  COGNITION: Overall cognitive status: Within functional limits for tasks assessed   SENSATION: Impaired LUE due to residual effect of CVA in 2018  COORDINATION: WNL's  POSTURE:  increased forward flexion as back pain increases - pt reports pain is less when he leans forward  LOWER EXTREMITY ROM:   WNL's bil. LE's   LOWER EXTREMITY MMT:    MMT Right Eval Left Eval  Hip flexion 4 5  Hip extension    Hip abduction    Hip adduction    Hip internal rotation    Hip external rotation    Knee flexion 4 4+  Knee extension 5 5  Ankle dorsiflexion 4- 5  Ankle plantarflexion    Ankle inversion    Ankle eversion    (Blank rows = not tested)  BED MOBILITY:  Independent  TRANSFERS: Assistive device utilized: None   Sit to stand: Complete Independence Stand to sit: Complete Independence  STAIRS: Level of Assistance: Modified  independence Stair Negotiation Technique: Alternating Pattern  with Single Rail on Right Number of Stairs: 4  Height of Stairs: 6  Comments: step over step sequence  GAIT: Gait pattern:  increased Rt foot slap in stance due to weak eccentric dorsiflexors and step through pattern Distance walked: 115' Assistive device utilized: None Level of assistance: Modified independence Comments: increased forward flexed posture as back pain increases  FUNCTIONAL TESTS:  5 times sit to stand: 8.40 secs without UE support from standard chair Timed up and go (TUG): 10.82 secs 10 meter walk test: 12.72 ft = 2.58 ft/sec  PATIENT SURVEYS:  N/A - dx is back pain  TODAY'S TREATMENT:                                                                                                                              DATE: 11-13-22    Patient seen for aquatic therapy today.  Treatment took place in water 3.6-4.0 feet Ricky depending upon activity.  Pt entered and exited the pool via stairs using B rails in reciprocal pattern.  Pool temp approx 92 deg.   Warm up:  Walking forwards x 18' x 4 laps, backwards x 4 laps without UE support, but continue to focus on posture.    Side stepping holding smaller barbells abd arms when LEs abd, squat>return then add arms/LEs x 2 laps.  Forward marching holding small barbells x 2 laps without UEs today as he needed increased cues to slow down and focus more on posture.    Focused on core strengthening and pelvic dissociation during next exercises:  "Saddle" sit on noodle.  Utilized wall for support and PT providing min A.  Worked on maintaining balance x several reps.  With BUE support on yellow barbells, performed LAQs x 10 reps,  bicycle across pool x 2 reps.  Min cues throughout for mild trunk changes (either ant/post).   Pt tolerated well.  Switched to "swing" sit  on noodle with wall for support and intermittent foot support on wall.  He tends to lean posteriorly and to the L so worked on providing facilitation at pelvis for improved ant tilt and to the R.  Worked on this for several minutes and then had him face wall and alt UE raises (kept one hand on wall) for trunk shortening/lengthening.  He has marked difficulty with task therefore moved to sitting on pool bench performing lateral pelvic tilts for trunk shortening on opposite side, however he still had some difficulty wanting to shorten on side moving towards so held pts hand and had him "follow his hand" for lateral reach almost outside of BOS to elicit trunk shortening on opposite side.  Performed 10 reps each side with pt better able to complete shift to the R.  Then performed ant/post pelvic tilt with demo and then hands on facilitation from PT x 20 reps.  He is able to achieve  movement in pelvis and provided education on  importance of trunk/pelvis dissociation during session.    Ended with LE kicks holding onto wall x 3 sets of 10 reps followed by placing yellow pool noodle across pts chest and holding kick board kicking across pool to further engage trunk extension and LE strengthening x 4 laps.    Provided verbal cues on exercises to do at gym and at home (prone I,T,Y exercises) for back strengthening/chest opening.  Also discussed that during last session, PT would have updated HEP for pt to continue to work on at club pool.     Pt requires buoyancy of water for support for reduced fall risk and for unloading/reduced stress on joints (spine) as pt able to tolerate increased standing and ambulation in water compared to that on land; viscosity of water is needed for resistance for strengthening and current of water provides perturbations for challenge for balance training      PATIENT EDUCATION: Education details: aquatic rationale Person educated: Patient Education method: Explanation Education  comprehension: verbalized understanding  HOME EXERCISE PROGRAM: Access Code: [email or web address] URL: [email or web address] Date: 10/16/2022 Prepared by: Harriet Butte  Exercises - Forward and Backward Stepping at [facility]  - 1 x daily - 7 x weekly - 1 sets - 2 reps - Lateral Stepping at [facility] Wall  - 1 x daily - 7 x weekly - 1 sets - 2 reps - Forward March with Opposite Arm Knee Taps and Hand Floats  - 1 x daily - 7 x weekly - 1 sets - 2 reps - Standing Hip Flexion Extension at [employer]  - 1 x daily - 7 x weekly - 1 sets - 10 reps - Standing Hip Circles at [employer]  - 1 x daily - 7 x weekly - 3 sets - 10 reps  GOALS: Goals reviewed with patient? Yes  SHORT TERM GOALS: Target date: 10-26-22  Pt will participate in aquatic PT session with c/o LBP </= 2/10 at end of session. Baseline: Goal status: INITIAL  2.  Pt will amb.10" nonstop in pool in various directions and maintain upright posture at least 75% time, I.e. at least 8". Baseline:  Goal status: INITIAL  3.  Pt will subjectively report increased relief in low back pain by experiencing longer duration prior to onset of back pain. Baseline:  Goal status: INITIAL  4.  Pt will be independent in basic aquatic HEP. Baseline:  Goal status: INITIAL    LONG TERM GOALS: Target date: 11-23-22  Pt will participate in aquatic PT session with c/o LBP </= 1/10 at end of session. Baseline:  Goal status: INITIAL  2.  Pt will subjectively report improvement in low back pain and functional status by reporting ability to carry a 5# object in his home at least 20' without experiencing back pain. Baseline:  Goal status: INITIAL  3.  Pt will negotiate steps in pool area with use of handrails using step over step sequence with supervision without report of increased back pain. Baseline:  Goal status: INITIAL  4.  Independent in updated aquatic HEP to include Ai Chi postures. Baseline:  Goal status:  INITIAL   ASSESSMENT:  CLINICAL IMPRESSION: Skilled session continues to focus on posture, core strengthening and LE strengthening.  Pt did very well today as PT introduced more core and pelvic dissociation exercises.  He had moderate difficulty but makes progress with increased repetition.    OBJECTIVE IMPAIRMENTS: difficulty walking, decreased strength, and pain.   ACTIVITY LIMITATIONS: carrying, bending, standing, squatting, stairs, and locomotion level  PARTICIPATION LIMITATIONS: cleaning, shopping, community activity, and yard work  PERSONAL FACTORS: Age, Past/current experiences, Time since onset of injury/illness/exacerbation, and 1 comorbidity: h/o lumbar DDD  are also affecting patient's functional outcome.   REHAB POTENTIAL: Good  CLINICAL DECISION MAKING: Stable/uncomplicated  EVALUATION COMPLEXITY: Low  PLAN:  PT FREQUENCY: 1x/week  PT DURATION: 8 weeks + eval = 9 visits  PLANNED INTERVENTIONS: Therapeutic exercises, Therapeutic activity, Neuromuscular re-education, Balance training, Gait training, Patient/Family education, Self Care, and Aquatic Therapy  PLAN FOR NEXT SESSION:  Add balancing to HEP   Harriet Butte, PT, MPT [facility] [address] [place], [place], [address] Phone: [phone]   Fax:  [phone] 11/13/22, 11:29 AM

## 2022-11-18 DIAGNOSIS — M47817 Spondylosis without myelopathy or radiculopathy, lumbosacral region: Secondary | ICD-10-CM | POA: Insufficient documentation

## 2022-11-18 DIAGNOSIS — M5459 Other low back pain: Secondary | ICD-10-CM | POA: Insufficient documentation

## 2022-11-18 NOTE — Progress Notes (Unsigned)
PROVIDER NOTE: Information contained herein reflects review and annotations entered in association with encounter. Interpretation of such information and data should be left to medically-trained personnel. Information provided to patient can be located elsewhere in the medical record under "Patient Instructions". Document created using STT-dictation technology, any transcriptional errors that may result from process are unintentional.    Patient: Ricky [name].  Service Category: E/M  Provider: Oswaldo Done, MD  DOB: 04-18-1945  DOS: 11/19/2022  Referring Provider: Shelva Majestic, MD  MRN: [id number]  Specialty: Interventional Pain Management  PCP: Shelva Majestic, MD  Type: Established Patient  Setting: Ambulatory outpatient    Location: Office  Delivery: Face-to-face     Primary Reason(s) for Visit: Encounter for evaluation before starting new chronic pain management plan of care (Level of risk: moderate) CC: No chief complaint on file.  HPI  [name] is a 77 y.o. year old, male patient, who comes today for a follow-up evaluation to review the test results and decide on a treatment plan. He has OA (osteoarthritis) of knee; Essential hypertension; History of CVA (cerebrovascular accident); Hemiparesis affecting dominant side as late effect of stroke (HCC); Carotid artery stenosis, symptomatic (Right); Trigger middle finger of hand (Left); Hyperlipidemia; Leukopenia; DDD (degenerative disc disease), lumbar; Acquired leg length discrepancy; Emphysema lung (HCC); Hyperglycemia; Senile purpura (HCC); Chronic pain syndrome; Pharmacologic therapy; Disorder of skeletal system; Problems influencing health status; Grade 2 Anterolisthesis (7mm) of lumbar spine (L4/L5); DDD (degenerative disc disease), lumbosacral; Dextroscoliosis of lumbar spine; Abnormal MRI, lumbar spine (05/19/2021); Spondylolisthesis of lumbar region; Grade 1 Retrolisthesis of L1/L2, L2/L3, & L3/L4; Lumbar facet arthropathy  (Multilevel) (Bilateral); Lumbar foraminal stenosis (Bilateral: L1-2, L2-3, L3-4, L4-5) (Right - SEVERE: L4-5); Lumbar lateral recess stenosis (Bilateral: L3-4, L4-5) (Right - SEVERE: L4-5); Ligamentum flavum hypertrophy (L3-4, L4-5, L5-S1); Chronic low back pain (1ry area of Pain) (Bilateral) w/o sciatica; Lumbar facet joint pain; and Spondylosis without myelopathy or radiculopathy, lumbosacral region on their problem list. His primarily concern today is the No chief complaint on file.  Pain Assessment: Location:     Radiating:   Onset:   Duration:   Quality:   Severity:  /10 (subjective, self-reported pain score)  Effect on ADL:   Timing:   Modifying factors:   BP:    HR:    [name] comes in today for a follow-up visit after his initial evaluation on 10/24/2022. Today we went over the results of his tests. These were explained in "Layman's terms". During today's appointment we went over my diagnostic impression, as well as the proposed treatment plan.  "According to the patient the primary, and only area of pain is that of the lower back (Bilateral) (R>L).  He denies any back surgery, but he admits to having had some recent x-rays of the lumbar spine as well as an MRI.  He has had 3 rounds of physical therapy and he has also seen a chiropractor, none of which have helped his pain.  He denies having any lower extremity pain, numbness, or weakness.  He has done some aqua therapy and he refers that while he is in the swimming pool he is doing fine, but after he comes out he is very sore.  He denies any pain when he is sitting or laying down.  He was referred to [place] by Dr. Antoine Primas who apparently had done some epidural steroid injections and facet blocks.  The patient indicates having had this interventional therapy by Dr. Antoine Primas as well  as Dr. Charise Killian.  He is very disappointed he is since he refers having had no long-term benefit from any of these therapies.  He refers that he had to  lumbar epidural steroid injections done in 2023, 1 on 06/05/2021 and the second 1 on 07/12/2021.  He then had a medial branch block done on 07/27/2022 and another lumbar epidural on 08/16/2022. Interventional therapies provided by other practitioners: Diagnostic/therapeutic left L4-5 LESI x1 (06/05/2021) by Antoine Primas, DO  Diagnostic/therapeutic right L4-5 LESI x1 (07/12/2021) by Antoine Primas, DO  Diagnostic bilateral L3 and L4 MBB x1 (07/27/2022) by Obie Dredge, MD  Diagnostic/therapeutic right L5-S1 LESI x1 (08/16/2022) by Obie Dredge, MD    The patient indicates that none of the above interventions provided him with any long-term benefit."  ***  Patient presented with interventional treatment options. Mr. Rideaux was informed that I will not be providing medication management. Pharmacotherapy evaluation including recommendations may be offered, if specifically requested.   Controlled Substance Pharmacotherapy Assessment REMS (Risk Evaluation and Mitigation Strategy)  Opioid Analgesic: None MME/day: 0 mg/day  Pill Count: None expected due to no prior prescriptions written by our practice. No notes on file Pharmacokinetics: Liberation and absorption (onset of action): WNL Distribution (time to peak effect): WNL Metabolism and excretion (duration of action): WNL         Pharmacodynamics: Desired effects: Analgesia: Mr. Bandy reports >50% benefit. Functional ability: Patient reports that medication allows him to accomplish basic ADLs Clinically meaningful improvement in function (CMIF): Sustained CMIF goals met Perceived effectiveness: Described as relatively effective, allowing for increase in activities of daily living (ADL) Undesirable effects: Side-effects or Adverse reactions: None reported Monitoring: [place] PMP: PDMP reviewed during this encounter. Online review of the past 29-month period previously conducted. Not applicable at this point since we have not taken over the patient's  medication management yet. List of other Serum/Urine Drug Screening Test(s):  No results found for: "AMPHSCRSER", "BARBSCRSER", "BENZOSCRSER", "COCAINSCRSER", "COCAINSCRNUR", "PCPSCRSER", "THCSCRSER", "THCU", "CANNABQUANT", "OPIATESCRSER", "OXYSCRSER", "PROPOXSCRSER", "ETH", "CBDTHCR", "D8THCCBX", "D9THCCBX" List of all UDS test(s) done:  No results found for: "TOXASSSELUR", "SUMMARY" Last UDS on record: No results found for: "TOXASSSELUR", "SUMMARY" UDS interpretation: No unexpected findings.          Medication Assessment Form: Not applicable. No opioids. Treatment compliance: Not applicable Risk Assessment Profile: Aberrant behavior: See initial evaluations. None observed or detected today Comorbid factors increasing risk of overdose: See initial evaluation. No additional risks detected today Opioid risk tool (ORT):     10/24/2022   10:38 AM  Opioid Risk   Alcohol 0  Illegal Drugs 0  Rx Drugs 0  Alcohol 0  Illegal Drugs 0  Rx Drugs 0  Age between 16-45 years  0  History of Preadolescent Sexual Abuse 0  Psychological Disease 0  Depression 0  Opioid Risk Tool Scoring 0  Opioid Risk Interpretation Low Risk    ORT Scoring interpretation table:  Score <3 = Low Risk for SUD  Score between 4-7 = Moderate Risk for SUD  Score >8 = High Risk for Opioid Abuse   Risk of substance use disorder (SUD): Low  Risk Mitigation Strategies:  Patient opioid safety counseling: No controlled substances prescribed. Patient-Prescriber Agreement (PPA): No agreement signed.  Controlled substance notification to other providers: None required. No opioid therapy.  Pharmacologic Plan: Non-opioid analgesic therapy offered. Interventional alternatives discussed.             Laboratory Chemistry Profile   Renal Lab Results  Component  Value Date   BUN 21 08/01/2022   CREATININE 0.76 08/01/2022   GFR 85.99 08/01/2022   GFRAA >60 03/20/2016   GFRNONAA >60 03/20/2016   SPECGRAV 1.020 08/03/2019    PHUR 6.0 08/03/2019   PROTEINUR Negative 08/03/2019     Electrolytes Lab Results  Component Value Date   NA 142 08/01/2022   K 4.2 08/01/2022   CL 108 08/01/2022   CALCIUM 9.0 08/01/2022     Hepatic Lab Results  Component Value Date   AST 24 08/01/2022   ALT 18 08/01/2022   ALBUMIN 3.8 08/01/2022   ALKPHOS 57 08/01/2022     ID Lab Results  Component Value Date   SARSCOV2NAA Not Detected 10/27/2020   STAPHAUREUS NEGATIVE 03/09/2016   MRSAPCR NEGATIVE 03/09/2016   HCVAB NEGATIVE 12/20/2014     Bone No results found for: "VD25OH", "VD125OH2TOT", "ZO1096EA5", "VD2125OH2", "25OHVITD1", "25OHVITD2", "25OHVITD3", "TESTOFREE", "TESTOSTERONE"   Endocrine Lab Results  Component Value Date   GLUCOSE 110 (H) 08/01/2022   GLUCOSEU NEGATIVE 03/09/2016   HGBA1C 5.9 08/01/2022   TSH 2.45 08/01/2022     Neuropathy Lab Results  Component Value Date   VITAMINB12 424 08/01/2022   HGBA1C 5.9 08/01/2022     CNS No results found for: "COLORCSF", "APPEARCSF", "RBCCOUNTCSF", "WBCCSF", "POLYSCSF", "LYMPHSCSF", "EOSCSF", "PROTEINCSF", "GLUCCSF", "JCVIRUS", "CSFOLI", "IGGCSF", "LABACHR", "ACETBL"   Inflammation (CRP: Acute  ESR: Chronic) Lab Results  Component Value Date   CRP <1.0 08/01/2022   ESRSEDRATE 7 08/01/2022     Rheumatology No results found for: "RF", "ANA", "LABURIC", "URICUR", "LYMEIGGIGMAB", "LYMEABIGMQN", "HLAB27"   Coagulation Lab Results  Component Value Date   INR 1.02 03/09/2016   LABPROT 13.4 03/09/2016   APTT 40 (H) 03/09/2016   PLT 254.0 08/01/2022     Cardiovascular Lab Results  Component Value Date   HGB 12.8 (L) 08/01/2022   HCT 38.7 (L) 08/01/2022     Screening Lab Results  Component Value Date   SARSCOV2NAA Not Detected 10/27/2020   STAPHAUREUS NEGATIVE 03/09/2016   MRSAPCR NEGATIVE 03/09/2016   HCVAB NEGATIVE 12/20/2014     Cancer No results found for: "CEA", "CA125", "LABCA2"   Allergens No results found for: "ALMOND", "APPLE",  "ASPARAGUS", "AVOCADO", "BANANA", "BARLEY", "BASIL", "BAYLEAF", "GREENBEAN", "LIMABEAN", "WHITEBEAN", "BEEFIGE", "REDBEET", "BLUEBERRY", "BROCCOLI", "CABBAGE", "MELON", "CARROT", "CASEIN", "CASHEWNUT", "CAULIFLOWER", "CELERY"     Note: Lab results reviewed.  Recent Diagnostic Imaging Review  Cervical Imaging: Cervical MR wo contrast: No results found for this or any previous visit.  Cervical MR wo contrast: No valid procedures specified. Cervical CT wo contrast: No results found for this or any previous visit.  Cervical DG Bending/F/E views: No results found for this or any previous visit.   Shoulder Imaging: Shoulder-R MR wo contrast: No results found for this or any previous visit.  Shoulder-L MR wo contrast: No results found for this or any previous visit.  Shoulder-R DG: No results found for this or any previous visit.  Shoulder-L DG: No results found for this or any previous visit.   Thoracic Imaging: Thoracic MR wo contrast: No results found for this or any previous visit.  Thoracic MR wo contrast: No valid procedures specified. Thoracic CT wo contrast: No results found for this or any previous visit.  Thoracic DG 4 views: No results found for this or any previous visit.  Thoracic DG w/swimmers view: No results found for this or any previous visit.   Lumbosacral Imaging: Lumbar MR wo contrast: Results for orders placed during the hospital encounter of  05/19/21  MR LUMBAR SPINE WO CONTRAST  Narrative CLINICAL DATA:  Provided history: Degenerative disc disease, lumbar. Low back pain, symptoms persist with greater than 6 weeks treatment; back pain. Additional history provided by scanning technologist: Patient reports low back pain radiating down bilateral legs for 6 months.  EXAM: MRI LUMBAR SPINE WITHOUT CONTRAST  TECHNIQUE: Multiplanar, multisequence MR imaging of the lumbar spine was performed. No intravenous contrast was administered.  COMPARISON:  Lumbar  spine radiographs 02/09/2021.  FINDINGS: Segmentation: For the purposes of this dictation, five lumbar vertebrae are assumed and the caudal most well-formed intervertebral disc is designated L5-S1. In correlating with the prior lumbar spine radiographs of 02/09/2021, ribs may be hypoplastic or absent at the T12 level.  Alignment: Lumbar dextrocurvature. 2 mm L1-L2 grade 1 retrolisthesis. 3 mm L2-L3 grade 1 retrolisthesis. Trace L3-L4 grade 1 retrolisthesis. 7 mm L4-L5 grade 1 anterolisthesis.  Vertebrae: No lumbar vertebral compression fracture. Multilevel degenerative endplate irregularity, greatest at L4-L5. L1 vertebral body hemangioma. Mild degenerative endplate edema at O5-D6, L3-L4 and L4-L5. Multilevel ventrolateral osteophytes.  Conus medullaris and cauda equina: Conus extends to the L1-L2 level. No signal abnormality identified within the visualized distal spinal cord.  Paraspinal and other soft tissues: Multiple renal cysts, the largest on the left measuring 6 cm. Suspected small gallstone within the gallbladder (series 5, image 16). Atrophy of the lumbar paraspinal musculature.  Disc levels:  Multilevel disc degeneration, greatest at L1-L2 (moderate to moderately advanced), L2-L3 (advanced), L3-L4 (moderate to moderately advanced) and L4-L5 (advanced).  T11-T12: No significant disc herniation or stenosis.  T12-L1: Minimal facet arthrosis. No significant disc herniation or stenosis.  L1-L2: Grade 1 retrolisthesis. Minimal facet arthrosis. No significant spinal canal stenosis. Mild bilateral neural foraminal narrowing (greater on the left).  L2-L3: Grade 1 retrolisthesis. Disc bulge. Mild facet arthrosis. No significant spinal canal stenosis. Mild-to-moderate bilateral neural foraminal narrowing.  L3-L4: Trace grade 1 retrolisthesis. Disc bulge with endplate osteophytes. Mild-to-moderate facet arthrosis with ligamentum flavum hypertrophy. Mild bilateral  subarticular narrowing (without nerve root impingement). Central canal patent. Moderate bilateral neural foraminal narrowing (greater on the right).  L4-L5: Grade 1 anterolisthesis. Disc uncovering with disc bulge and endplate osteophytes. Advanced facet arthrosis with ligamentum flavum hypertrophy. Severe right subarticular and central canal stenosis. Moderate left subarticular stenosis. Bilateral neural foraminal narrowing (severe right, moderate left).  L5-S1: Facet arthrosis (moderate right, mild left) with slight ligamentum flavum hypertrophy. Trace fluid within the left facet joint. No significant disc herniation or stenosis.  IMPRESSION: 1. In correlating with the prior lumbar spine radiographs of 02/09/2021, ribs may be hypoplastic or absent at the T12 level. Careful attention to spinal numbering is recommended prior to any surgical intervention. 2. Lumbar spondylosis, as outlined and with findings most notably as follows. 3. At L4-L5, there is 7 mm grade 1 anterolisthesis. Disc uncovering with disc bulge. Advanced facet arthrosis with ligamentum flavum hypertrophy. Severe right subarticular and central canal stenosis. Moderate left subarticular stenosis. Bilateral neural foraminal narrowing (severe right, moderate left). 4. No more than mild spinal canal narrowing at the remaining levels. 5. Additional sites of foraminal stenosis, as detailed and greatest bilaterally at L2-L3 (mild-to-moderate) and L3-L4 (moderate). 6. Disc degeneration is greatest at L1-L2 (moderate to moderately advanced), L2-L3 (advanced), L3-L4 (moderate to moderately advanced) and L4-L5 (advanced). Mild degenerative endplate edema at U4-Q0, L3-L4 and L4-L5. 7. Lumbar dextrocurvature. 8. Mild grade 1 retrolisthesis at L1-L2, L2-L3 and L3-L4. 9. Suspected cholecystolithiasis.   Electronically Signed By: Jackey Loge D.O. On: 05/19/2021  08:13  Lumbar MR wo contrast: No valid procedures  specified. Lumbar CT wo contrast: No results found for this or any previous visit.  Lumbar DG Bending views: Results for orders placed during the hospital encounter of 10/24/22  DG Lumbar Spine Complete W/Bend  Narrative CLINICAL DATA:  Low back pain for several years.  EXAM: LUMBAR SPINE - COMPLETE WITH BENDING VIEWS  COMPARISON:  February 09, 2021  FINDINGS: There is no evidence of lumbar spine fracture. Grade 2 anterolisthesis of L4 on L5 is noted. Degree of anterolisthesis at L4-5 is unchanged on flexion and extension views. Degenerative joint changes throughout spine with narrow intervertebral space osteophyte formation and vacuum disc phenomenon are noted. Extensive bowel content identified throughout colon.  IMPRESSION: Grade 2 anterolisthesis of L4 on L5. Degree of anterolisthesis at L4-5 is unchanged on flexion and extension views.   Electronically Signed By: Sherian Rein M.D. On: 10/24/2022 13:45         Sacroiliac Joint Imaging: Sacroiliac Joint DG: No results found for this or any previous visit.   Hip Imaging: Hip-R MR wo contrast: No results found for this or any previous visit.  Hip-L MR wo contrast: No results found for this or any previous visit.  Hip-R CT wo contrast: No results found for this or any previous visit.  Hip-L CT wo contrast: No results found for this or any previous visit.  Hip-R DG 2-3 views: No results found for this or any previous visit.  Hip-L DG 2-3 views: No results found for this or any previous visit.  Hip-B DG Bilateral: No results found for this or any previous visit.  Hip-B DG Bilateral (5V): No results found for this or any previous visit.   Knee Imaging: Knee-R MR wo contrast: No results found for this or any previous visit.  Knee-L MR wo contrast: No results found for this or any previous visit.  Knee-R CT wo contrast: No results found for this or any previous visit.  Knee-L CT wo contrast: No results found  for this or any previous visit.  Knee-R DG 4 views: No results found for this or any previous visit.  Knee-L DG 4 views: No results found for this or any previous visit.   Ankle Imaging: Ankle-R DG Complete: No results found for this or any previous visit.  Ankle-L DG Complete: No results found for this or any previous visit.   Foot Imaging: Foot-R DG Complete: No results found for this or any previous visit.  Foot-L DG Complete: No results found for this or any previous visit.   Elbow Imaging: Elbow-R DG Complete: No results found for this or any previous visit.  Elbow-L DG Complete: No results found for this or any previous visit.   Wrist Imaging: Wrist-R DG Complete: No results found for this or any previous visit.  Wrist-L DG Complete: No results found for this or any previous visit.   Hand Imaging: Hand-R DG Complete: No results found for this or any previous visit.  Hand-L DG Complete: No results found for this or any previous visit.   Complexity Note: Imaging results reviewed.                         Meds   Current Outpatient Medications:    acetaminophen (TYLENOL) 500 MG tablet, Take 500-1,000 mg by mouth every 6 (six) hours as needed (for pain.). , Disp: , Rfl:    albuterol (VENTOLIN HFA) 108 (90 Base) MCG/ACT  inhaler, Inhale 2 puffs into the lungs every 6 (six) hours as needed for wheezing or shortness of breath., Disp: 1 each, Rfl: 0   aspirin EC 81 MG tablet, Take 81 mg by mouth daily. Swallow whole., Disp: , Rfl:    atorvastatin (LIPITOR) 20 MG tablet, TAKE 1 TABLET BY MOUTH EVERY OTHER DAY, Disp: 45 tablet, Rfl: 2   Multiple Vitamin (MULTIVITAMIN WITH MINERALS) TABS tablet, Take 1 tablet by mouth daily., Disp: , Rfl:    pregabalin (LYRICA) 50 MG capsule, Take 1 capsule (50 mg total) by mouth 3 (three) times daily., Disp: 90 capsule, Rfl: 1   tiZANidine (ZANAFLEX) 2 MG tablet, Take 1 tablet (2 mg total) by mouth every 6 (six) hours as needed for muscle  spasms., Disp: 100 tablet, Rfl: 1  ROS  Constitutional: Denies any fever or chills Gastrointestinal: No reported hemesis, hematochezia, vomiting, or acute GI distress Musculoskeletal: Denies any acute onset joint swelling, redness, loss of ROM, or weakness Neurological: No reported episodes of acute onset apraxia, aphasia, dysarthria, agnosia, amnesia, paralysis, loss of coordination, or loss of consciousness  Allergies  Mr. Googe is allergic to amoxicillin.  PFSH  Drug: Mr. Rayhill  reports no history of drug use. Alcohol:  reports no history of alcohol use. Tobacco:  reports that he has never smoked. He has never been exposed to tobacco smoke. He has never used smokeless tobacco. Medical:  has a past medical history of Arthritis, Carotid artery occlusion, Complication of anesthesia, Hyperlipidemia (03/08/2016), Hypertension, Peripheral vascular disease (HCC), Pneumonia, and Stroke (HCC). Surgical: Mr. Kleckner  has a past surgical history that includes Shoulder surgery (Bilateral); Tonsillectomy; Total knee arthroplasty (Left, 12/14/2013); Endarterectomy (Right, 03/19/2016); Patch angioplasty (Right, 03/19/2016); Carotid endarterectomy; and Cataract extraction w/ intraocular lens implant (Bilateral, 2024). Family: family history includes Heart disease in his father; Other in his mother.  Constitutional Exam  General appearance: Well nourished, well developed, and well hydrated. In no apparent acute distress There were no vitals filed for this visit. BMI Assessment: Estimated body mass index is 22.27 kg/m as calculated from the following:   Height as of 11/01/22: 5\' 5"  (1.651 m).   Weight as of 11/01/22: 133 lb 12.8 oz (60.7 kg).  BMI interpretation table: BMI level Category Range association with higher incidence of chronic pain  <18 kg/m2 Underweight   18.5-24.9 kg/m2 Ideal body weight   25-29.9 kg/m2 Overweight Increased incidence by 20%  30-34.9 kg/m2 Obese (Class I) Increased  incidence by 68%  35-39.9 kg/m2 Severe obesity (Class II) Increased incidence by 136%  >40 kg/m2 Extreme obesity (Class III) Increased incidence by 254%   Patient's current BMI Ideal Body weight  There is no height or weight on file to calculate BMI. Patient weight not recorded   BMI Readings from Last 4 Encounters:  11/01/22 22.27 kg/m  10/24/22 21.97 kg/m  08/28/22 22.47 kg/m  08/01/22 22.51 kg/m   Wt Readings from Last 4 Encounters:  11/01/22 133 lb 12.8 oz (60.7 kg)  10/24/22 132 lb (59.9 kg)  08/28/22 135 lb (61.2 kg)  08/01/22 135 lb 4 oz (61.3 kg)    Psych/Mental status: Alert, oriented x 3 (person, place, & time)       Eyes: PERLA Respiratory: No evidence of acute respiratory distress  Assessment & Plan  Primary Diagnosis & Pertinent Problem List: The primary encounter diagnosis was Chronic low back pain (1ry area of Pain) (Bilateral) w/o sciatica. Diagnoses of Grade 1 Retrolisthesis of L1/L2, L2/L3, & L3/L4, Grade 2 Anterolisthesis (7mm)  of lumbar spine (L4/L5), Lumbar facet joint pain, Lumbar facet arthropathy (Multilevel) (Bilateral), Spondylolisthesis of lumbar region, Spondylosis without myelopathy or radiculopathy, lumbosacral region, and Abnormal MRI, lumbar spine (05/19/2021) were also pertinent to this visit.  Visit Diagnosis: 1. Chronic low back pain (1ry area of Pain) (Bilateral) w/o sciatica   2. Grade 1 Retrolisthesis of L1/L2, L2/L3, & L3/L4   3. Grade 2 Anterolisthesis (7mm) of lumbar spine (L4/L5)   4. Lumbar facet joint pain   5. Lumbar facet arthropathy (Multilevel) (Bilateral)   6. Spondylolisthesis of lumbar region   7. Spondylosis without myelopathy or radiculopathy, lumbosacral region   8. Abnormal MRI, lumbar spine (05/19/2021)    Problems updated and reviewed during this visit: Problem  Lumbar Facet Joint Pain  Spondylosis Without Myelopathy Or Radiculopathy, Lumbosacral Region    Plan of Care  Pharmacotherapy (Medications Ordered): No  orders of the defined types were placed in this encounter.  Procedure Orders    No procedure(s) ordered today   Lab Orders  No laboratory test(s) ordered today   Imaging Orders  No imaging studies ordered today   Referral Orders  No referral(s) requested today    Pharmacological management:  Opioid Analgesics: I will not be prescribing any opioids at this time Membrane stabilizer: I will not be prescribing any at this time Muscle relaxant: I will not be prescribing any at this time NSAID: I will not be prescribing any at this time Other analgesic(s): I will not be prescribing any at this time      Interventional Therapies  Risk Factors  Considerations  Medical Comorbidities:     Planned  Pending:      Under consideration:   Pending   Completed:   None at this time   Therapeutic  Palliative (PRN) options:   None established   Completed by other providers:   Apparently the patient had 3 lumbar epidural steroid injections in 3 Lumbar facet treatments by Judi Saa, DO          Provider-requested follow-up: No follow-ups on file. Recent Visits Date Type Provider Dept  10/24/22 Office Visit Delano Metz, MD [facility]  Showing recent visits within past 90 days and meeting all other requirements Future Appointments Date Type Provider Dept  11/19/22 Appointment Delano Metz, MD [facility]  Showing future appointments within next 90 days and meeting all other requirements   Primary Care Physician: Shelva Majestic, MD  Duration of encounter: *** minutes.  Total time on encounter, as per AMA guidelines included both the face-to-face and non-face-to-face time personally spent by the physician and/or other qualified health care professional(s) on the day of the encounter (includes time in activities that require the physician or other qualified health care professional and does not include time in activities normally  performed by clinical staff). Physician's time may include the following activities when performed: Preparing to see the patient (e.g., pre-charting review of records, searching for previously ordered imaging, lab work, and nerve conduction tests) Review of prior analgesic pharmacotherapies. Reviewing PMP Interpreting ordered tests (e.g., lab work, imaging, nerve conduction tests) Performing post-procedure evaluations, including interpretation of diagnostic procedures Obtaining and/or reviewing separately obtained history Performing a medically appropriate examination and/or evaluation Counseling and educating the patient/family/caregiver Ordering medications, tests, or procedures Referring and communicating with other health care professionals (when not separately reported) Documenting clinical information in the electronic or other health record Independently interpreting results (not separately reported) and communicating results to the patient/ family/caregiver Care coordination (  not separately reported)  Note by: Oswaldo Done, MD (TTS technology used. I apologize for any typographical errors that were not detected and corrected.) Date: 11/19/2022; Time: 2:03 PM

## 2022-11-18 NOTE — Patient Instructions (Signed)

## 2022-11-19 ENCOUNTER — Ambulatory Visit: Payer: Medicare Other | Attending: Pain Medicine | Admitting: Pain Medicine

## 2022-11-19 ENCOUNTER — Encounter: Payer: Self-pay | Admitting: Pain Medicine

## 2022-11-19 VITALS — BP 172/84 | HR 57 | Temp 97.4°F | Ht 65.0 in | Wt 133.0 lb

## 2022-11-19 DIAGNOSIS — M47817 Spondylosis without myelopathy or radiculopathy, lumbosacral region: Secondary | ICD-10-CM | POA: Insufficient documentation

## 2022-11-19 DIAGNOSIS — M4310 Spondylolisthesis, site unspecified: Secondary | ICD-10-CM | POA: Diagnosis not present

## 2022-11-19 DIAGNOSIS — M545 Low back pain, unspecified: Secondary | ICD-10-CM | POA: Insufficient documentation

## 2022-11-19 DIAGNOSIS — M5459 Other low back pain: Secondary | ICD-10-CM | POA: Insufficient documentation

## 2022-11-19 DIAGNOSIS — M4316 Spondylolisthesis, lumbar region: Secondary | ICD-10-CM | POA: Diagnosis not present

## 2022-11-19 DIAGNOSIS — M47816 Spondylosis without myelopathy or radiculopathy, lumbar region: Secondary | ICD-10-CM | POA: Diagnosis not present

## 2022-11-19 DIAGNOSIS — G8929 Other chronic pain: Secondary | ICD-10-CM | POA: Diagnosis not present

## 2022-11-19 DIAGNOSIS — R937 Abnormal findings on diagnostic imaging of other parts of musculoskeletal system: Secondary | ICD-10-CM | POA: Insufficient documentation

## 2022-11-19 NOTE — Telephone Encounter (Signed)
Patient called [place] for an update. States he would like to know where clinical team recommends he goes. Please Advise.

## 2022-11-19 NOTE — Progress Notes (Signed)
Safety precautions to be maintained throughout the outpatient stay will include: orient to surroundings, keep bed in low position, maintain call bell within reach at all times, provide assistance with transfer out of bed and ambulation.  

## 2022-11-20 ENCOUNTER — Encounter: Payer: Self-pay | Admitting: Rehabilitation

## 2022-11-20 ENCOUNTER — Ambulatory Visit: Payer: Medicare Other | Admitting: Rehabilitation

## 2022-11-20 DIAGNOSIS — M5459 Other low back pain: Secondary | ICD-10-CM | POA: Diagnosis not present

## 2022-11-20 DIAGNOSIS — M6281 Muscle weakness (generalized): Secondary | ICD-10-CM

## 2022-11-20 NOTE — Therapy (Signed)
OUTPATIENT PHYSICAL THERAPY NEURO TREATMENT   Patient Name: Ricky [name]. MRN: [id number] DOB:[birth year], 77 y.o., male Today's Date: 11/20/2022   PCP: Shelva Majestic., MD REFERRING PROVIDER: Judi Saa, DO  END OF SESSION:  PT End of Session - 11/20/22 0812     Visit Number 7    Number of Visits 9    Date for PT Re-Evaluation 11/30/22   pushed out due to schedule availability   Authorization Type Medicare/BCBS    Authorization Time Period 09-17-22 - 12-06-22    PT Start Time 0830    PT Stop Time 0915    PT Time Calculation (min) 45 min    Equipment Utilized During Treatment Other (comment)   bar bells (small, multi-colored)   Activity Tolerance Patient tolerated treatment well    Behavior During Therapy [facility] for tasks assessed/performed             Past Medical History:  Diagnosis Date   Arthritis    Carotid artery occlusion    Complication of anesthesia    slow to wake up after shoulder surgeries   Hyperlipidemia 03/08/2016   LDL 75 pre stroke. Goal <70 so on atorvastatin 20mg  every other day   Hypertension    Peripheral vascular disease (HCC)    Pneumonia    Stroke [facility])    still has decreased sensation in left hand and arm (as of 03/09/16)   Past Surgical History:  Procedure Laterality Date   CAROTID ENDARTERECTOMY     CATARACT EXTRACTION W/ INTRAOCULAR LENS IMPLANT Bilateral 2024   ENDARTERECTOMY Right 03/19/2016   Procedure: ENDARTERECTOMY CAROTID;  Surgeon: Sherren Kerns, MD;  Location: [facility] OR;  Service: Vascular;  Laterality: Right;   PATCH ANGIOPLASTY Right 03/19/2016   Procedure: PATCH ANGIOPLASTY OF RIGHT CAROTID USING HEMASHIELD PLATINUM FINESSE;  Surgeon: Sherren Kerns, MD;  Location: [facility] OR;  Service: Vascular;  Laterality: Right;   SHOULDER SURGERY Bilateral    left- injections intermittent now  Rotator cuff repair on both shoulders, 2 different surgeries   TONSILLECTOMY     TOTAL KNEE ARTHROPLASTY Left 12/14/2013   Procedure:  LEFT TOTAL KNEE ARTHROPLASTY;  Surgeon: Loanne Drilling, MD;  Location: [facility] ORS;  Service: Orthopedics;  Laterality: Left;   Patient Active Problem List   Diagnosis Date Noted   Lumbar facet joint pain 11/18/2022   Spondylosis without myelopathy or radiculopathy, lumbosacral region 11/18/2022   Grade 1 Retrolisthesis of L1/L2, L2/L3, & L3/L4 10/25/2022   Lumbar facet arthropathy (Multilevel) (Bilateral) 10/25/2022   Lumbar foraminal stenosis (Bilateral: L1-2, L2-3, L3-4, L4-5) (Right - SEVERE: L4-5) 10/25/2022   Lumbar lateral recess stenosis (Bilateral: L3-4, L4-5) (Right - SEVERE: L4-5) 10/25/2022   Ligamentum flavum hypertrophy (L3-4, L4-5, L5-S1) 10/25/2022   Chronic low back pain (1ry area of Pain) (Bilateral) w/o sciatica 10/25/2022   Grade 2 Anterolisthesis (7mm) of lumbar spine (L4/L5) 10/24/2022   DDD (degenerative disc disease), lumbosacral 10/24/2022   Dextroscoliosis of lumbar spine 10/24/2022   Abnormal MRI, lumbar spine (05/19/2021) 10/24/2022   Spondylolisthesis of lumbar region 10/24/2022   Chronic pain syndrome 10/22/2022   Pharmacologic therapy 10/22/2022   Disorder of skeletal system 10/22/2022   Problems influencing health status 10/22/2022   Emphysema lung (HCC) 08/25/2021   Hyperglycemia 08/25/2021   Senile purpura (HCC) 08/25/2021   DDD (degenerative disc disease), lumbar 02/09/2021   Acquired leg length discrepancy 02/09/2021   Leukopenia 01/08/2017   Trigger middle finger of hand (Left) 11/12/2016   Carotid artery stenosis, symptomatic (  Right) 03/19/2016   Hemiparesis affecting dominant side as late effect of stroke (HCC) 03/08/2016   Hyperlipidemia 03/08/2016   History of CVA (cerebrovascular accident) 02/28/2016   Essential hypertension 12/20/2014   OA (osteoarthritis) of knee 12/14/2013    ONSET DATE: Referral date   REFERRING DIAG: M51.36 (ICD-10-CM) - DDD (degenerative disc disease), lumbar  THERAPY DIAG:  Other low back pain  Muscle weakness  (generalized)  Rationale for Evaluation and Treatment: Rehabilitation  SUBJECTIVE:                                                                                                                                                                                             SUBJECTIVE STATEMENT:      Pt reports he went to pool once since last session, exercises went well.     Pt accompanied by: self  PERTINENT HISTORY: Lt TKA 12/2013;  CVA Feb. 2018 with LUE residual sensation deficits:  HTN: PVD:  Emphysema lung   PAIN:    Are you having pain? Yes: NPRS scale: 2/10 Pain description: throbbing Aggravating factors: strenuous activity - like yard work or housework  Relieving factors: sitting or lying down helps to relieve  Location:  Low back/lumbar - pt reports slightly more pain on Rt side than on Lt side of low back   PRECAUTIONS: None  RED FLAGS: None   WEIGHT BEARING RESTRICTIONS: No  FALLS: Has patient fallen in last 6 months? No  LIVING ENVIRONMENT: Lives with: lives with their spouse Lives in: House/apartment Stairs: Yes: Internal: 12 steps; on right going up Has following equipment at home: None  PLOF: Independent  PATIENT GOALS: learn aquatic exercises to do independently  OBJECTIVE:   DIAGNOSTIC FINDINGS: None recently  COGNITION: Overall cognitive status: Within functional limits for tasks assessed   SENSATION: Impaired LUE due to residual effect of CVA in 2018  COORDINATION: WNL's  POSTURE:  increased forward flexion as back pain increases - pt reports pain is less when he leans forward  LOWER EXTREMITY ROM:   WNL's bil. LE's   LOWER EXTREMITY MMT:    MMT Right Eval Left Eval  Hip flexion 4 5  Hip extension    Hip abduction    Hip adduction    Hip internal rotation    Hip external rotation    Knee flexion 4 4+  Knee extension 5 5  Ankle dorsiflexion 4- 5  Ankle plantarflexion    Ankle inversion    Ankle eversion    (Blank rows = not  tested)  BED MOBILITY:  Independent  TRANSFERS: Assistive device utilized: None  Sit to stand: Complete  Independence Stand to sit: Complete Independence  STAIRS: Level of Assistance: Modified independence Stair Negotiation Technique: Alternating Pattern  with Single Rail on Right Number of Stairs: 4  Height of Stairs: 6  Comments: step over step sequence  GAIT: Gait pattern:  increased Rt foot slap in stance due to weak eccentric dorsiflexors and step through pattern Distance walked: 115' Assistive device utilized: None Level of assistance: Modified independence Comments: increased forward flexed posture as back pain increases  FUNCTIONAL TESTS:  5 times sit to stand: 8.40 secs without UE support from standard chair Timed up and go (TUG): 10.82 secs 10 meter walk test: 12.72 ft = 2.58 ft/sec  PATIENT SURVEYS:  N/A - dx is back pain  TODAY'S TREATMENT:                                                                                                                              DATE: 11-20-22    Patient seen for aquatic therapy today.  Treatment took place in water 3.6-4.0 feet deep depending upon activity.  Pt entered and exited the pool via stairs using B rails in reciprocal pattern.  Pool temp approx 92 deg.   Warm up:  Walking forwards x 18' x 4 laps, backwards x 4 laps without UE support, but continue to focus on posture.    Side stepping holding smaller barbells abd arms when LEs abd, squat>return then add arms/LEs x 2 laps.  Forward marching holding small barbells x 2 laps without UEs today so that we can continue to focus on posture and balance with slow movements.  Also performed backwards marching x 2 laps, again with cues for posture as he tends to tip forwards when marching back.    Standing balance/strengthening:  Added 3.5# weight to ankle for next exercises-standing hip flex/ext (with knee ext) x 10 reps each side.  Again with discussed and demo'd in session how  to make more of a balance challenge by using barbells/noodle instead of wall.  Had him use wall today so that he could continue with leg motion without stopping.  Forward step ups with single UE support with opposite LE march x 20 reps each side with cues for continued upright posture throughout and relaxed shoulders.  Forward/lateral/posterior stepping x 4 rounds on each side without UE support and cues for large step out with weight shift and then single return to midline.    Focused on core strengthening and pelvic dissociation during next exercises:  "Saddle" sit on noodle.  Utilized wall for support and PT providing min A.  Worked on maintaining balance x several reps.  With BUE support on yellow barbells, bicycle across pool x 2 reps.  Min cues throughout for mild trunk changes (either ant/post). Pt did much better today with task.  Pt tolerated well.  Switched to "swing" sit on noodle with wall for support and intermittent foot support on wall.  He tends to lean posteriorly and to  the L so worked on providing facilitation at pelvis for improved ant tilt and to the R.  Worked on this for several minutes.  Again, pt demos good improvement in pelvic tilts today to maintain balance.  Sitting on pool bench (with feet supported) performing lateral pelvic tilts for trunk shortening on opposite side, with lateral/upward reaches to elicit more trunk activation.  Pt continues to do better with tasks, esp reaching to the left as with last session.    Ended with sit<>stand from 3rd pool step (feet on 1st step) without UE support with reaching forward transitioning into stand, pressing arms back for improved posture and back strengthening once in stand x 10 reps.  Will add this to HEP for next session.   Pt educated to bring in HEP next session so that we can update/mark as we need to and PT will have any additional exercises ready for next session also.        Pt requires buoyancy of water for support for  reduced fall risk and for unloading/reduced stress on joints (spine) as pt able to tolerate increased standing and ambulation in water compared to that on land; viscosity of water is needed for resistance for strengthening and current of water provides perturbations for challenge for balance training      PATIENT EDUCATION: Education details: aquatic rationale Person educated: Patient Education method: Explanation Education comprehension: verbalized understanding  HOME EXERCISE PROGRAM: Access Code: [email or web address] URL: [email or web address] Date: 10/16/2022 Prepared by: Harriet Butte  Exercises - Forward and Backward Stepping at [facility]  - 1 x daily - 7 x weekly - 1 sets - 2 reps - Lateral Stepping at [facility]  - 1 x daily - 7 x weekly - 1 sets - 2 reps - Forward March with Opposite Arm Knee Taps and Hand Floats  - 1 x daily - 7 x weekly - 1 sets - 2 reps - Standing Hip Flexion Extension at [employer]  - 1 x daily - 7 x weekly - 1 sets - 10 reps - Standing Hip Circles at [employer]  - 1 x daily - 7 x weekly - 3 sets - 10 reps  GOALS: Goals reviewed with patient? Yes  SHORT TERM GOALS: Target date: 10-26-22  Pt will participate in aquatic PT session with c/o LBP </= 2/10 at end of session. Baseline: Goal status: INITIAL  2.  Pt will amb.10" nonstop in pool in various directions and maintain upright posture at least 75% time, I.e. at least 8". Baseline:  Goal status: INITIAL  3.  Pt will subjectively report increased relief in low back pain by experiencing longer duration prior to onset of back pain. Baseline:  Goal status: INITIAL  4.  Pt will be independent in basic aquatic HEP. Baseline:  Goal status: INITIAL    LONG TERM GOALS: Target date: 11-23-22  Pt will participate in aquatic PT session with c/o LBP </= 1/10 at end of session. Baseline:  Goal status: INITIAL  2.  Pt will subjectively report improvement in low back pain and functional status by  reporting ability to carry a 5# object in his home at least 20' without experiencing back pain. Baseline:  Goal status: INITIAL  3.  Pt will negotiate steps in pool area with use of handrails using step over step sequence with supervision without report of increased back pain. Baseline:  Goal status: INITIAL  4.  Independent in updated aquatic HEP to include Ai Chi postures. Baseline:  Goal status:  INITIAL   ASSESSMENT:  CLINICAL IMPRESSION: Continue to focus on LE strengthening, balance and pelvis dissociation.  Pt did much better today with pelvic exercises, needing less facilitation from PT and also tolerated addition of LE weights for strengthening/balance tasks.  Will plan to go over final HEP at next visit.    OBJECTIVE IMPAIRMENTS: difficulty walking, decreased strength, and pain.   ACTIVITY LIMITATIONS: carrying, bending, standing, squatting, stairs, and locomotion level  PARTICIPATION LIMITATIONS: cleaning, shopping, community activity, and yard work  PERSONAL FACTORS: Age, Past/current experiences, Time since onset of injury/illness/exacerbation, and 1 comorbidity: h/o lumbar DDD  are also affecting patient's functional outcome.   REHAB POTENTIAL: Good  CLINICAL DECISION MAKING: Stable/uncomplicated  EVALUATION COMPLEXITY: Low  PLAN:  PT FREQUENCY: 1x/week  PT DURATION: 8 weeks + eval = 9 visits  PLANNED INTERVENTIONS: Therapeutic exercises, Therapeutic activity, Neuromuscular re-education, Balance training, Gait training, Patient/Family education, Self Care, and Aquatic Therapy  PLAN FOR NEXT SESSION:  Go over HEP and DC   Harriet Butte, PT, MPT [facility] [address] [place], [place], [address] Phone: [phone]   Fax:  [phone] 11/20/22, 9:19 AM

## 2022-11-20 NOTE — Progress Notes (Unsigned)
[name] [address] [place] [address] Phone: [phone] Subjective:   Ricky [name], am serving as a scribe for Dr. Antoine Primas.  I'm seeing this patient by the request  of:  Shelva Majestic, MD  CC: Low back pain  BJY:[id number]  06/28/2022 Significant degenerative disc disease of the lumbar spine.  The MRI did show the patient does have advanced degenerative disc disease as well as advanced facet arthritis noted at the L4-L5 area.  We attempted to increase the Cymbalta but patient did not feel like it was making any significant improvement previously.  Has failed formal physical therapy as well as gabapentin.  Discussed with patient we attempted epidurals but I do think we could try a medial branch block at the L4-L5 area with most patient's symptoms being localized.  Pain is affecting daily activities at the moment.  We discussed which activities to do and which ones to avoid.  Follow-up with me again in 6 to 8 weeks.      Update 11/21/2022 Ricky [name]. is a 78 y.o. male coming in with complaint of lumbar DDD. Patient states back pain is about the same. Sometimes will get worse. On and off his L knee has been bothering him. Also noticing he's had more fuzzy brain lately.      Past Medical History:  Diagnosis Date   Arthritis    Carotid artery occlusion    Complication of anesthesia    slow to wake up after shoulder surgeries   Hyperlipidemia 03/08/2016   LDL 75 pre stroke. Goal <70 so on atorvastatin 20mg  every other day   Hypertension    Peripheral vascular disease (HCC)    Pneumonia    Stroke [facility])    still has decreased sensation in left hand and arm (as of 03/09/16)   Past Surgical History:  Procedure Laterality Date   CAROTID ENDARTERECTOMY     CATARACT EXTRACTION W/ INTRAOCULAR LENS IMPLANT Bilateral 2024   ENDARTERECTOMY Right 03/19/2016   Procedure: ENDARTERECTOMY CAROTID;  Surgeon: Sherren Kerns, MD;   Location: [facility] OR;  Service: Vascular;  Laterality: Right;   PATCH ANGIOPLASTY Right 03/19/2016   Procedure: PATCH ANGIOPLASTY OF RIGHT CAROTID USING HEMASHIELD PLATINUM FINESSE;  Surgeon: Sherren Kerns, MD;  Location: [facility] OR;  Service: Vascular;  Laterality: Right;   SHOULDER SURGERY Bilateral    left- injections intermittent now  Rotator cuff repair on both shoulders, 2 different surgeries   TONSILLECTOMY     TOTAL KNEE ARTHROPLASTY Left 12/14/2013   Procedure: LEFT TOTAL KNEE ARTHROPLASTY;  Surgeon: Loanne Drilling, MD;  Location: [facility];  Service: Orthopedics;  Laterality: Left;   Social History   Socioeconomic History   Marital status: Married    Spouse name: Not on file   Number of children: Not on file   Years of education: Not on file   Highest education level: Bachelor's degree (e.g., BA, AB, BS)  Occupational History   Occupation: Retired  Tobacco Use   Smoking status: Never    Passive exposure: Never   Smokeless tobacco: Never  Substance and Sexual Activity   Alcohol use: No    Comment: former drinker   Drug use: No   Sexual activity: Yes  Other Topics Concern   Not on file  Social History Narrative   Married. 1 son. 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  Hobbies: gym daily- riding exercise bike plus weights/machines, photography- used to do a lot of black and white, gardening   Social Determinants of Health   Financial Resource Strain: Low Risk  (07/31/2022)   Overall Financial Resource Strain (CARDIA)    Difficulty of Paying Living Expenses: Not hard at all  Food Insecurity: No Food Insecurity (07/31/2022)   Hunger Vital Sign    Worried About Running Out of Food in the Last Year: Never true    Ran Out of Food in the Last Year: Never true  Transportation Needs: No Transportation Needs (07/31/2022)   PRAPARE - Administrator, Civil Service (Medical): No    Lack of Transportation  (Non-Medical): No  Physical Activity: Sufficiently Active (07/31/2022)   Exercise Vital Sign    Days of Exercise per Week: 3 days    Minutes of Exercise per Session: 70 min  Recent Concern: Physical Activity - Insufficiently Active (05/05/2022)   Exercise Vital Sign    Days of Exercise per Week: 2 days    Minutes of Exercise per Session: 70 min  Stress: No Stress Concern Present (07/31/2022)   [employer] of Occupational Health - Occupational Stress Questionnaire    Feeling of Stress : Only a little  Social Connections: Moderately Isolated (07/31/2022)   Social Connection and Isolation Panel [NHANES]    Frequency of Communication with Friends and Family: Once a week    Frequency of Social Gatherings with Friends and Family: Once a week    Attends Religious Services: Never    Database administrator or Organizations: Yes    Attends Engineer, structural: More than 4 times per year    Marital Status: Married   Allergies  Allergen Reactions   Amoxicillin Rash    Has patient had a PCN reaction causing immediate rash, facial/tongue/throat swelling, SOB or lightheadedness with hypotension:unsure Has patient had a PCN reaction causing severe rash involving mucus membranes or skin necrosis:unsure Has patient had a PCN reaction that required hospitalization No Has patient had a PCN reaction occurring within the last 10 years: No If all of the above answers are "NO", then may proceed with Cephalosporin use.    Family History  Problem Relation Age of Onset   Heart disease Father        in 44s- specifics unclear   Other Mother        passed in late 20s     Current Outpatient Medications (Cardiovascular):    atorvastatin (LIPITOR) 20 MG tablet, TAKE 1 TABLET BY MOUTH EVERY OTHER DAY  Current Outpatient Medications (Respiratory):    albuterol (VENTOLIN HFA) 108 (90 Base) MCG/ACT inhaler, Inhale 2 puffs into the lungs every 6 (six) hours as needed for wheezing or shortness of  breath.  Current Outpatient Medications (Analgesics):    celecoxib (CELEBREX) 100 MG capsule, Take 1 capsule (100 mg total) by mouth 2 (two) times daily.   acetaminophen (TYLENOL) 500 MG tablet, Take 500-1,000 mg by mouth every 6 (six) hours as needed (for pain.).    aspirin EC 81 MG tablet, Take 81 mg by mouth daily. Swallow whole.   Current Outpatient Medications (Other):    Multiple Vitamin (MULTIVITAMIN WITH MINERALS) TABS tablet, Take 1 tablet by mouth daily.   pregabalin (LYRICA) 50 MG capsule, Take 1 capsule (50 mg total) by mouth 3 (three) times daily.   tiZANidine (ZANAFLEX) 2 MG tablet, Take 1 tablet (2 mg total) by mouth every 6 (six) hours as needed for muscle  spasms.   Reviewed prior external information including notes and imaging from  primary care provider As well as notes that were available from care everywhere and other healthcare systems.  Past medical history, social, surgical and family history all reviewed in electronic medical record.  No pertanent information unless stated regarding to the chief complaint.   Review of Systems:  No headache, visual changes, nausea, vomiting, diarrhea, constipation, dizziness, abdominal pain, skin rash, fevers, chills, night sweats, weight loss, swollen lymph nodes, body aches, joint swelling, chest pain, shortness of breath, mood changes. POSITIVE muscle aches  Objective  Blood pressure (!) 130/90, height 5\' 5"  (1.651 m), weight 135 lb (61.2 kg).   General: No apparent distress alert and oriented x3 mood and affect normal, dressed appropriately.  HEENT: Pupils equal, extraocular movements intact  Respiratory: Patient's speak in full sentences and does not appear short of breath  Cardiovascular: No lower extremity edema, non tender, no erythema  Severe foraminal stenosis does cause patient to have difficulty with extension of the back.  Does have some degenerative scoliosis noted.  Some very mild difficulty getting out of a seated  position.  Does sit though comfortably.    Impression and Recommendations:     The above documentation has been reviewed and is accurate and complete Judi Saa, DO

## 2022-11-21 ENCOUNTER — Ambulatory Visit: Payer: Medicare Other | Admitting: Family Medicine

## 2022-11-21 ENCOUNTER — Other Ambulatory Visit: Payer: Self-pay | Admitting: Family Medicine

## 2022-11-21 VITALS — BP 130/90 | Ht 65.0 in | Wt 135.0 lb

## 2022-11-21 DIAGNOSIS — M4316 Spondylolisthesis, lumbar region: Secondary | ICD-10-CM

## 2022-11-21 MED ORDER — CELECOXIB 100 MG PO CAPS: 100.0000 mg | ORAL_CAPSULE | Freq: Two times a day (BID) | ORAL | 1 refills | Status: DC

## 2022-11-21 NOTE — Patient Instructions (Addendum)
Celebrex 100mg  2x a day Happy Early B Day Consider spinal stimulator Labs checked appropriately See me again in 2-3 months

## 2022-11-21 NOTE — Telephone Encounter (Signed)
Patient called stating that there is a coupon that will cover the 100mg  twice a day.

## 2022-11-21 NOTE — Telephone Encounter (Signed)
Patient called stating that his insurance will cover one 200mg  capsule but will not cover two 100mg  capsules.   Please advise.

## 2022-11-21 NOTE — Telephone Encounter (Signed)
Left message advising patient to use coupon for original prescription called in (100mg  twice a day).

## 2022-11-21 NOTE — Telephone Encounter (Signed)
Sent patient message to let him know insurance will not cover medication. Asked if he was comfortable playing out of pocket cost or if he would like Dr. Katrinka Blazing to consider another medication.

## 2022-11-21 NOTE — Assessment & Plan Note (Signed)
Significant difficulty noted.  Does have grade 2 retrolisthesis now at certain levels.  Has seen multiple different providers.  At this point wonder if patient would be a candidate for spinal stimulator.  Will try Celebrex with patient's kidney function and doing well.  See how patient responds to this.  Has failed multiple other medications.  Is doing aquatic therapy that is keeping him active.  Follow-up with me again in 2 to 3 months.  Total time reviewing patient's imaging and discussing with patient 33 minutes

## 2022-11-25 ENCOUNTER — Encounter: Payer: Self-pay | Admitting: Family Medicine

## 2022-11-26 ENCOUNTER — Encounter: Payer: Self-pay | Admitting: Rehabilitation

## 2022-11-26 ENCOUNTER — Telehealth: Payer: Self-pay | Admitting: Rehabilitation

## 2022-11-26 NOTE — Telephone Encounter (Signed)
Entered in error

## 2022-11-27 ENCOUNTER — Encounter: Payer: Self-pay | Admitting: Rehabilitation

## 2022-11-27 ENCOUNTER — Ambulatory Visit: Payer: Medicare Other | Admitting: Rehabilitation

## 2022-11-27 DIAGNOSIS — M5459 Other low back pain: Secondary | ICD-10-CM | POA: Diagnosis not present

## 2022-11-27 DIAGNOSIS — M6281 Muscle weakness (generalized): Secondary | ICD-10-CM | POA: Diagnosis not present

## 2022-11-27 NOTE — Therapy (Signed)
OUTPATIENT PHYSICAL THERAPY NEURO TREATMENT and DC SUMMARY   Patient Name: Ricky [name]. MRN: [id number] DOB:[birth year], 77 y.o., male Today's Date: 11/27/2022   PCP: Shelva Majestic., MD REFERRING PROVIDER: Judi Saa, DO  END OF SESSION:  PT End of Session - 11/27/22 0813     Visit Number 8    Number of Visits 9    Date for PT Re-Evaluation 11/30/22   pushed out due to schedule availability   Authorization Type Medicare/BCBS    Authorization Time Period 09-17-22 - 12-06-22    PT Start Time 0835    PT Stop Time 0920    PT Time Calculation (min) 45 min    Equipment Utilized During Treatment Other (comment)   bar bells (small, multi-colored)   Activity Tolerance Patient tolerated treatment well    Behavior During Therapy [facility] for tasks assessed/performed             Past Medical History:  Diagnosis Date   Arthritis    Carotid artery occlusion    Complication of anesthesia    slow to wake up after shoulder surgeries   Hyperlipidemia 03/08/2016   LDL 75 pre stroke. Goal <70 so on atorvastatin 20mg  every other day   Hypertension    Peripheral vascular disease (HCC)    Pneumonia    Stroke [facility])    still has decreased sensation in left hand and arm (as of 03/09/16)   Past Surgical History:  Procedure Laterality Date   CAROTID ENDARTERECTOMY     CATARACT EXTRACTION W/ INTRAOCULAR LENS IMPLANT Bilateral 2024   ENDARTERECTOMY Right 03/19/2016   Procedure: ENDARTERECTOMY CAROTID;  Surgeon: Sherren Kerns, MD;  Location: [facility] OR;  Service: Vascular;  Laterality: Right;   PATCH ANGIOPLASTY Right 03/19/2016   Procedure: PATCH ANGIOPLASTY OF RIGHT CAROTID USING HEMASHIELD PLATINUM FINESSE;  Surgeon: Sherren Kerns, MD;  Location: [facility] OR;  Service: Vascular;  Laterality: Right;   SHOULDER SURGERY Bilateral    left- injections intermittent now  Rotator cuff repair on both shoulders, 2 different surgeries   TONSILLECTOMY     TOTAL KNEE ARTHROPLASTY Left 12/14/2013    Procedure: LEFT TOTAL KNEE ARTHROPLASTY;  Surgeon: Loanne Drilling, MD;  Location: [facility] ORS;  Service: Orthopedics;  Laterality: Left;   Patient Active Problem List   Diagnosis Date Noted   Lumbar facet joint pain 11/18/2022   Spondylosis without myelopathy or radiculopathy, lumbosacral region 11/18/2022   Grade 1 Retrolisthesis of L1/L2, L2/L3, & L3/L4 10/25/2022   Lumbar facet arthropathy (Multilevel) (Bilateral) 10/25/2022   Lumbar foraminal stenosis (Bilateral: L1-2, L2-3, L3-4, L4-5) (Right - SEVERE: L4-5) 10/25/2022   Lumbar lateral recess stenosis (Bilateral: L3-4, L4-5) (Right - SEVERE: L4-5) 10/25/2022   Ligamentum flavum hypertrophy (L3-4, L4-5, L5-S1) 10/25/2022   Chronic low back pain (1ry area of Pain) (Bilateral) w/o sciatica 10/25/2022   Grade 2 Anterolisthesis (7mm) of lumbar spine (L4/L5) 10/24/2022   DDD (degenerative disc disease), lumbosacral 10/24/2022   Dextroscoliosis of lumbar spine 10/24/2022   Abnormal MRI, lumbar spine (05/19/2021) 10/24/2022   Spondylolisthesis of lumbar region 10/24/2022   Chronic pain syndrome 10/22/2022   Pharmacologic therapy 10/22/2022   Disorder of skeletal system 10/22/2022   Problems influencing health status 10/22/2022   Emphysema lung (HCC) 08/25/2021   Hyperglycemia 08/25/2021   Senile purpura (HCC) 08/25/2021   DDD (degenerative disc disease), lumbar 02/09/2021   Acquired leg length discrepancy 02/09/2021   Leukopenia 01/08/2017   Trigger middle finger of hand (Left) 11/12/2016   Carotid  artery stenosis, symptomatic (Right) 03/19/2016   Hemiparesis affecting dominant side as late effect of stroke (HCC) 03/08/2016   Hyperlipidemia 03/08/2016   History of CVA (cerebrovascular accident) 02/28/2016   Essential hypertension 12/20/2014   OA (osteoarthritis) of knee 12/14/2013    ONSET DATE: Referral date   REFERRING DIAG: M51.36 (ICD-10-CM) - DDD (degenerative disc disease), lumbar  THERAPY DIAG:  Other low back  pain  Muscle weakness (generalized)  Rationale for Evaluation and Treatment: Rehabilitation  SUBJECTIVE:                                                                                                                                                                                             SUBJECTIVE STATEMENT:      Pt reports not doing well today, unsure of reason, maybe he over did it yesterday at gym.  Went to MD and got started on celebrex.  Note that they are going to talk about nerve stimulator possibly at next appt.     Pt accompanied by: self  PERTINENT HISTORY: Lt TKA 12/2013;  CVA Feb. 2018 with LUE residual sensation deficits:  HTN: PVD:  Emphysema lung   PAIN:    Are you having pain? Yes: NPRS scale: 6/10 Pain description: throbbing Aggravating factors: strenuous activity - like yard work or housework  Relieving factors: sitting or lying down helps to relieve  Location:  Low back/lumbar - pt reports slightly more pain on Rt side than on Lt side of low back   PRECAUTIONS: None  RED FLAGS: None   WEIGHT BEARING RESTRICTIONS: No  FALLS: Has patient fallen in last 6 months? No  LIVING ENVIRONMENT: Lives with: lives with their spouse Lives in: House/apartment Stairs: Yes: Internal: 12 steps; on right going up Has following equipment at home: None  PLOF: Independent  PATIENT GOALS: learn aquatic exercises to do independently  OBJECTIVE:   DIAGNOSTIC FINDINGS: None recently  COGNITION: Overall cognitive status: Within functional limits for tasks assessed   SENSATION: Impaired LUE due to residual effect of CVA in 2018  COORDINATION: WNL's  POSTURE:  increased forward flexion as back pain increases - pt reports pain is less when he leans forward  LOWER EXTREMITY ROM:   WNL's bil. LE's   LOWER EXTREMITY MMT:    MMT Right Eval Left Eval  Hip flexion 4 5  Hip extension    Hip abduction    Hip adduction    Hip internal rotation    Hip external  rotation    Knee flexion 4 4+  Knee extension 5 5  Ankle dorsiflexion 4- 5  Ankle plantarflexion  Ankle inversion    Ankle eversion    (Blank rows = not tested)  BED MOBILITY:  Independent  TRANSFERS: Assistive device utilized: None  Sit to stand: Complete Independence Stand to sit: Complete Independence  STAIRS: Level of Assistance: Modified independence Stair Negotiation Technique: Alternating Pattern  with Single Rail on Right Number of Stairs: 4  Height of Stairs: 6  Comments: step over step sequence  GAIT: Gait pattern:  increased Rt foot slap in stance due to weak eccentric dorsiflexors and step through pattern Distance walked: 115' Assistive device utilized: None Level of assistance: Modified independence Comments: increased forward flexed posture as back pain increases  FUNCTIONAL TESTS:  5 times sit to stand: 8.40 secs without UE support from standard chair Timed up and go (TUG): 10.82 secs 10 meter walk test: 12.72 ft = 2.58 ft/sec  PATIENT SURVEYS:  N/A - dx is back pain  TODAY'S TREATMENT:                                                                                                                              DATE: 11-20-22    Patient seen for aquatic therapy today.  Treatment took place in water 3.6-4.0 feet deep depending upon activity.  Pt entered and exited the pool via stairs using B rails in reciprocal pattern.  Pool temp approx 92 deg.   Warm up:  Walking forwards x 18' x 4 laps, backwards x 4 laps without UE support, but continue to focus on posture.    Reviewed HEP and made updates on current program as needed to increase/decrease challenge:  Access Code: [email or web address] URL: [email or web address] Date: 11/27/2022 Prepared by: Harriet Butte  Exercises - Forward and Backward Stepping at [facility]  - 1 x daily - 7 x weekly - 1 sets - 2 reps - Lateral Stepping at [facility] Wall  - 1 x daily - 7 x weekly - 1 sets - 2 reps - Forward  March with Opposite Arm Knee Taps and Hand Floats  - 1 x daily - 7 x weekly - 1 sets - 2 reps - Standing Hip Flexion Extension at [employer]  - 1 x daily - 7 x weekly - 1 sets - 10 reps - Standing Hip Circles at [employer]  - 1 x daily - 7 x weekly - 3 sets - 10 reps - Runner's Step Up/Down  - 1 x daily - 7 x weekly - 1 sets - 10 reps - Braided Sidestepping  - 1 x daily - 7 x weekly - 1 sets - 2-3 reps - Pelvic Tilt at Pool Wall  - 1 x daily - 7 x weekly - 1 sets - 10 reps  Also performed Ai Chi Accepting x 10 reps each side and modified "Balancing" with single UE support on wall x 10 reps each direction.    Pt unsure of step set up at his pool but did write sit<>stands on HEP and also  provided education on step up exercise that if he is unable to do those, skip them.    Pt requires buoyancy of water for support for reduced fall risk and for unloading/reduced stress on joints (spine) as pt able to tolerate increased standing and ambulation in water compared to that on land; viscosity of water is needed for resistance for strengthening and current of water provides perturbations for challenge for balance training      PATIENT EDUCATION: Education details: aquatic rationale Person educated: Patient Education method: Explanation Education comprehension: verbalized understanding  HOME EXERCISE PROGRAM: Access Code: [email or web address] URL: [email or web address] Date: 10/16/2022 Prepared by: Harriet Butte  Exercises - Forward and Backward Stepping at [facility]  - 1 x daily - 7 x weekly - 1 sets - 2 reps - Lateral Stepping at [facility] Wall  - 1 x daily - 7 x weekly - 1 sets - 2 reps - Forward March with Opposite Arm Knee Taps and Hand Floats  - 1 x daily - 7 x weekly - 1 sets - 2 reps - Standing Hip Flexion Extension at [employer]  - 1 x daily - 7 x weekly - 1 sets - 10 reps - Standing Hip Circles at [employer]  - 1 x daily - 7 x weekly - 3 sets - 10 reps  GOALS: Goals reviewed with patient?  Yes  SHORT TERM GOALS: Target date: 10-26-22  Pt will participate in aquatic PT session with c/o LBP </= 2/10 at end of session. Baseline: Goal status: INITIAL  2.  Pt will amb.10" nonstop in pool in various directions and maintain upright posture at least 75% time, I.e. at least 8". Baseline:  Goal status: INITIAL  3.  Pt will subjectively report increased relief in low back pain by experiencing longer duration prior to onset of back pain. Baseline:  Goal status: INITIAL  4.  Pt will be independent in basic aquatic HEP. Baseline:  Goal status: INITIAL    LONG TERM GOALS: Target date: 11-30-22  Pt will participate in aquatic PT session with c/o LBP </= 1/10 at end of session. Baseline:  Goal status: Partially met, he does have decrease in back pain in session, but experiences soreness later in day.    2.  Pt will subjectively report improvement in low back pain and functional status by reporting ability to carry a 5# object in his home at least 20' without experiencing back pain. Baseline:  Goal:  Partially met, he can perform task, however has continued back pain when not in water.    3.  Pt will negotiate steps in pool area with use of handrails using step over step sequence with supervision without report of increased back pain. Baseline:  Goal status: MET  4.  Independent in updated aquatic HEP to include Ai Chi postures. Baseline:  Goal status: MET     PHYSICAL THERAPY DISCHARGE SUMMARY  Visits from Start of Care: 8  Current functional level related to goals / functional outcomes: See goals above   Remaining deficits: Continues to be limited by pain and has not seen much improvement with pain carrying over from pool to land.     Education / Equipment: HEP for pool    Patient agrees to discharge. Patient goals were met. Patient is being discharged due to meeting the stated rehab goals.   ASSESSMENT:  CLINICAL IMPRESSION: Last session at pool today  focused on going over HEP and making any updates/changes to improve carryover.  Pt returns demonstration  on all exercises.  Pt has met 2/4 LTGs, partially meeting remaining goals due to continued low back pain that is better in the water but returns to same level when on land, esp when he has to walk longer distances.  Recommend continue water exercise and also answered questions regarding dry needling in session.  Educated that it is used in adjunct to Energy manager.  Recommended if he want to do this, get a new PT order to include dry needling and get into ortho clinic as more clinicians will be certified.  Pt verbalized understanding.   OBJECTIVE IMPAIRMENTS: difficulty walking, decreased strength, and pain.   ACTIVITY LIMITATIONS: carrying, bending, standing, squatting, stairs, and locomotion level  PARTICIPATION LIMITATIONS: cleaning, shopping, community activity, and yard work  PERSONAL FACTORS: Age, Past/current experiences, Time since onset of injury/illness/exacerbation, and 1 comorbidity: h/o lumbar DDD  are also affecting patient's functional outcome.   REHAB POTENTIAL: Good  CLINICAL DECISION MAKING: Stable/uncomplicated  EVALUATION COMPLEXITY: Low  PLAN:  PT FREQUENCY: 1x/week  PT DURATION: 8 weeks + eval = 9 visits  PLANNED INTERVENTIONS: Therapeutic exercises, Therapeutic activity, Neuromuscular re-education, Balance training, Gait training, Patient/Family education, Self Care, and Aquatic Therapy  PLAN FOR NEXT SESSION:  Go over HEP and DC   Harriet Butte, PT, MPT [facility] [address] [place], [place], [address] Phone: [phone]   Fax:  [phone] 11/27/22, 9:29 AM

## 2022-11-28 ENCOUNTER — Encounter: Payer: Self-pay | Admitting: Family Medicine

## 2022-11-28 ENCOUNTER — Other Ambulatory Visit: Payer: Self-pay

## 2022-11-28 DIAGNOSIS — R410 Disorientation, unspecified: Secondary | ICD-10-CM

## 2022-12-03 ENCOUNTER — Encounter: Payer: Self-pay | Admitting: Family Medicine

## 2022-12-04 ENCOUNTER — Ambulatory Visit: Payer: Self-pay | Admitting: Rehabilitation

## 2022-12-11 ENCOUNTER — Other Ambulatory Visit: Payer: Self-pay

## 2022-12-11 DIAGNOSIS — Z08 Encounter for follow-up examination after completed treatment for malignant neoplasm: Secondary | ICD-10-CM | POA: Diagnosis not present

## 2022-12-11 DIAGNOSIS — L821 Other seborrheic keratosis: Secondary | ICD-10-CM | POA: Diagnosis not present

## 2022-12-11 DIAGNOSIS — C44519 Basal cell carcinoma of skin of other part of trunk: Secondary | ICD-10-CM | POA: Diagnosis not present

## 2022-12-11 DIAGNOSIS — D225 Melanocytic nevi of trunk: Secondary | ICD-10-CM | POA: Diagnosis not present

## 2022-12-11 DIAGNOSIS — L814 Other melanin hyperpigmentation: Secondary | ICD-10-CM | POA: Diagnosis not present

## 2022-12-11 DIAGNOSIS — Z85828 Personal history of other malignant neoplasm of skin: Secondary | ICD-10-CM | POA: Diagnosis not present

## 2022-12-11 MED ORDER — CELECOXIB 100 MG PO CAPS: 200.0000 mg | ORAL_CAPSULE | Freq: Two times a day (BID) | ORAL | 1 refills | Status: DC

## 2022-12-14 ENCOUNTER — Ambulatory Visit (INDEPENDENT_AMBULATORY_CARE_PROVIDER_SITE_OTHER): Payer: Medicare Other | Admitting: Family Medicine

## 2022-12-14 ENCOUNTER — Encounter: Payer: Self-pay | Admitting: Family Medicine

## 2022-12-14 VITALS — BP 122/80 | HR 55 | Temp 97.7°F | Ht 65.0 in | Wt 136.2 lb

## 2022-12-14 DIAGNOSIS — R410 Disorientation, unspecified: Secondary | ICD-10-CM | POA: Diagnosis not present

## 2022-12-14 DIAGNOSIS — I10 Essential (primary) hypertension: Secondary | ICD-10-CM

## 2022-12-14 DIAGNOSIS — M5136 Other intervertebral disc degeneration, lumbar region with discogenic back pain only: Secondary | ICD-10-CM

## 2022-12-14 DIAGNOSIS — H538 Other visual disturbances: Secondary | ICD-10-CM

## 2022-12-14 NOTE — Progress Notes (Signed)
Phone [phone] In person visit   Subjective:   Ricky [name]. is a 77 y.o. year old very pleasant male patient who presents for/with See problem oriented charting Chief Complaint  Patient presents with   f/u on pain    Past Medical History-  Patient Active Problem List   Diagnosis Date Noted   Hemiparesis affecting dominant side as late effect of stroke (HCC) 03/08/2016    Priority: High   History of CVA (cerebrovascular accident) 02/28/2016    Priority: High   Emphysema lung (HCC) 08/25/2021    Priority: Medium    Hyperglycemia 08/25/2021    Priority: Medium    Carotid artery stenosis, symptomatic (Right) 03/19/2016    Priority: Medium    Hyperlipidemia 03/08/2016    Priority: Medium    Essential hypertension 12/20/2014    Priority: Medium    Senile purpura (HCC) 08/25/2021    Priority: Low   DDD (degenerative disc disease), lumbar 02/09/2021    Priority: Low   Acquired leg length discrepancy 02/09/2021    Priority: Low   Leukopenia 01/08/2017    Priority: Low   Trigger middle finger of hand (Left) 11/12/2016    Priority: Low   OA (osteoarthritis) of knee 12/14/2013    Priority: Low   Lumbar facet joint pain 11/18/2022   Spondylosis without myelopathy or radiculopathy, lumbosacral region 11/18/2022   Grade 1 Retrolisthesis of L1/L2, L2/L3, & L3/L4 10/25/2022   Lumbar facet arthropathy (Multilevel) (Bilateral) 10/25/2022   Lumbar foraminal stenosis (Bilateral: L1-2, L2-3, L3-4, L4-5) (Right - SEVERE: L4-5) 10/25/2022   Lumbar lateral recess stenosis (Bilateral: L3-4, L4-5) (Right - SEVERE: L4-5) 10/25/2022   Ligamentum flavum hypertrophy (L3-4, L4-5, L5-S1) 10/25/2022   Chronic low back pain (1ry area of Pain) (Bilateral) w/o sciatica 10/25/2022   Grade 2 Anterolisthesis (7mm) of lumbar spine (L4/L5) 10/24/2022   DDD (degenerative disc disease), lumbosacral 10/24/2022   Dextroscoliosis of lumbar spine 10/24/2022   Abnormal MRI, lumbar spine (05/19/2021)  10/24/2022   Spondylolisthesis of lumbar region 10/24/2022   Chronic pain syndrome 10/22/2022   Pharmacologic therapy 10/22/2022   Disorder of skeletal system 10/22/2022   Problems influencing health status 10/22/2022    Medications- reviewed and updated Current Outpatient Medications  Medication Sig Dispense Refill   acetaminophen (TYLENOL) 500 MG tablet Take 500-1,000 mg by mouth every 6 (six) hours as needed (for pain.).      albuterol (VENTOLIN HFA) 108 (90 Base) MCG/ACT inhaler Inhale 2 puffs into the lungs every 6 (six) hours as needed for wheezing or shortness of breath. 1 each 0   aspirin EC 81 MG tablet Take 81 mg by mouth daily. Swallow whole.     atorvastatin (LIPITOR) 20 MG tablet TAKE 1 TABLET BY MOUTH EVERY OTHER DAY 45 tablet 2   celecoxib (CELEBREX) 100 MG capsule Take 2 capsules (200 mg total) by mouth 2 (two) times daily. 120 capsule 1   Multiple Vitamin (MULTIVITAMIN WITH MINERALS) TABS tablet Take 1 tablet by mouth daily.     pregabalin (LYRICA) 50 MG capsule Take 1 capsule (50 mg total) by mouth 3 (three) times daily. 90 capsule 1   tiZANidine (ZANAFLEX) 2 MG tablet Take 1 tablet (2 mg total) by mouth every 6 (six) hours as needed for muscle spasms. (Patient not taking: Reported on 12/14/2022) 100 tablet 1   No current facility-administered medications for this visit.     Objective:  BP 122/80   Pulse (!) 55   Temp 97.7 F (36.5 C)  Ht 5\' 5"  (1.651 m)   Wt 136 lb 3.2 oz (61.8 kg)   SpO2 99%   BMI 22.66 kg/m  Gen: NAD, resting comfortably     Assessment and Plan   #hypertension S: medication: None  -Previous reductions from 7.5 mg--> 5--> 2.5 mg after BP dropped below 100 at times and later taken off Home readings #s: has home cuff -numbers probably averaging 130s/80's at home- sometimes as low as 107 and as high as 147 with diastolic occasionally into the 90's.   Exercise-hard with pain issues Low-salt diet-tries to limit salt intake    A/P: well  controlled today in office.  with how low blood pressure gets even without medicine at this point we opted to remain off blood pressure medicine and continue to monitor- he does a great job bringing comprehensive home log.  -did give him the option if blood pressure stays above 140/90 at home to take amlodipine 2.5 mg but typically even if his # is high it improves on repeat so not sure he will need that often  # Degenerative disc disease/lumbar stenosis S: Patient with ongoing significant low back pain -working with Dr. Katrinka Blazing 10/03/2021-Cymbalta was increased to 40 mg and he was referred to neurosurgery Dr. Jake Samples - he came off cymbalta and  gabapentin 300mg  twice a day with concern could be affecting mental fog -Prior ibuprofen but we advised against this due to GI bleeding risks -Dry needling and multiple injections have not been helpful  -Chiropractic care not helpful -Physical therapy not helpful -Medial branch nerve block did not helpon 11/19/2022 -Celebrex trial through Dr. Katrinka Blazing 100 mg twice daily on regular basis with up to 200 mg twice a day with flares. Mild improvement on this.  -aquatherapy at [facility] some help. Pool at gym though walking back and forth forward, backwards and sideays and gets some relif from that  I also appreciated patient summarization of his history-"back pain-treatment started January 2023 with Dr. Ayesha Mohair.  Has gotten worse over the treatment period.  2 epidural injections with Dr. Steele Sizer and Dr. Lorrine Kin, for medial branch blocks with Dr. Charise Killian including 2 on each side, 1 epidural injection with Dr. Charise Killian, physical therapy, chiropractor Dr. Scotty Court, pain management [place] Dr. Laban Emperor With proposal for multiple site branch block x 2 with ablation if successful."  - still pending options: -Dr. Katrinka Blazing Spinal stimulator has been considered  - considering acupuncture- gave him name for [facility] - Dr. Laban Emperor proposes multiple site branch block x2 with  ablation if successful  -Lyrica and tizanidine trial 11/01/2022 but has not tried yet  ASSESSMENT & PLAN: Poor control.  Ultimately patient decided to trial acupuncture and continue Celebrex.  He is still holding off on Lyrica but is considering still.  May also reach back out to Dr. Laban Emperor to consider multiple site branch blocks  #Head fullness/fuzzy brain, "out of it", not thinking clearly. Coffee worsens condition in morning -has had blurry vision mid distance- issues after cataract surgery without improvement with progressive lenses - denied neurology referral due to no office visit when Dr. Katrinka Blazing referred him -We opted to start with an MRI with ongoing head fullness/fuzzy brain/confusion to rule out stroke or mass - Prior blood work for confusion/unclear thinking including B12 and TSH normal  Recommended follow up: Return in about 3 months (around 03/16/2023) for followup or sooner if needed.Schedule b4 you leave. Future Appointments  Date Time Provider Department Center  01/23/2023 11:00 AM Judi Saa, DO [facility]  None  03/18/2023 10:20 AM Shelva Majestic, MD [facility]  05/14/2023  9:00 AM [facility] ANNUAL WELLNESS VISIT 1 [facility]    Lab/Order associations:   ICD-10-CM   1. Degeneration of intervertebral disc of lumbar region with discogenic back pain  M51.360     2. Blurry vision  H53.8 MR Brain W Wo Contrast    3. Confusion  R41.0 MR Brain W Wo Contrast    4. Essential hypertension  I10      No orders of the defined types were placed in this encounter.  Return precautions advised.  Tana Conch, MD

## 2022-12-14 NOTE — Patient Instructions (Addendum)
-  did give him the option if blood pressure stays above 140/90 at home to take amlodipine 2.5 mg but typically even if his # is high it improves on repeat so not sure he will need that often  -option for acupuncture [email or web address]  We will call you within two weeks about your referral to MRI brain through [facility].  Their phone number is [phone].  Please call them if you have not heard in 1-2 weeks -lets get this first (read may take 2-3 weeks after completing exam) and consider neurology consult afterwards  Recommended follow up: Return in about 3 months (around 03/16/2023) for followup or sooner if needed.Schedule b4 you leave.

## 2022-12-25 ENCOUNTER — Encounter: Payer: Self-pay | Admitting: Family Medicine

## 2022-12-27 ENCOUNTER — Other Ambulatory Visit: Payer: Self-pay

## 2022-12-27 MED ORDER — ROSUVASTATIN CALCIUM 5 MG PO TABS: 5.0000 mg | ORAL_TABLET | Freq: Every day | ORAL | 3 refills | Status: DC

## 2022-12-28 ENCOUNTER — Ambulatory Visit: Payer: Medicare Other | Admitting: Family Medicine

## 2023-01-02 ENCOUNTER — Encounter: Payer: Self-pay | Admitting: Family Medicine

## 2023-01-02 NOTE — Telephone Encounter (Signed)
Please see patient message and advise.

## 2023-01-10 ENCOUNTER — Encounter: Payer: Self-pay | Admitting: Physician Assistant

## 2023-01-22 NOTE — Progress Notes (Unsigned)
[facility] [address] [place] [address] Phone: [phone] Subjective:   Bruce Donath, am serving as a scribe for Dr. Antoine Primas.  I'm seeing this patient by the request  of:  Shelva Majestic, MD  CC: low back pain   [id number]  11/21/2022 Significant difficulty noted.  Does have grade 2 retrolisthesis now at certain levels.  Has seen multiple different providers.  At this point wonder if patient would be a candidate for spinal stimulator.  Will try Celebrex with patient's kidney function and doing well.  See how patient responds to this.  Has failed multiple other medications.  Is doing aquatic therapy that is keeping him active.  Follow-up with me again in 2 to 3 months.  Total time reviewing patient's imaging and discussing with patient 33 minutes      Update 01/23/2023 Franklyn Lor. is a 77 y.o. male coming in with complaint of lumbar spine pain.  At last exam we did attempt Celebrex.  Increase to 200 mg twice a day as needed.  Reviewing patient's chart has been to physical therapy multiple times.  Patient states that he continues to have lower back pain. Does not feel like Celebrex is helping. Patient is unsure if he wants to get medial branch blocks.    Patient is following up with another provider to talk about what sounds to be medial branch blocks.  Patient is scheduled for an MRI of the brain December 23 by primary care.  Past Medical History:  Diagnosis Date   Arthritis    Carotid artery occlusion    Complication of anesthesia    slow to wake up after shoulder surgeries   Hyperlipidemia 03/08/2016   LDL 75 pre stroke. Goal <70 so on atorvastatin 20mg  every other day   Hypertension    Peripheral vascular disease (HCC)    Pneumonia    Stroke [facility])    still has decreased sensation in left hand and arm (as of 03/09/16)   Past Surgical History:  Procedure Laterality Date   CAROTID ENDARTERECTOMY     CATARACT  EXTRACTION W/ INTRAOCULAR LENS IMPLANT Bilateral 2024   ENDARTERECTOMY Right 03/19/2016   Procedure: ENDARTERECTOMY CAROTID;  Surgeon: Sherren Kerns, MD;  Location: [facility] OR;  Service: Vascular;  Laterality: Right;   PATCH ANGIOPLASTY Right 03/19/2016   Procedure: PATCH ANGIOPLASTY OF RIGHT CAROTID USING HEMASHIELD PLATINUM FINESSE;  Surgeon: Sherren Kerns, MD;  Location: [facility] OR;  Service: Vascular;  Laterality: Right;   SHOULDER SURGERY Bilateral    left- injections intermittent now  Rotator cuff repair on both shoulders, 2 different surgeries   TONSILLECTOMY     TOTAL KNEE ARTHROPLASTY Left 12/14/2013   Procedure: LEFT TOTAL KNEE ARTHROPLASTY;  Surgeon: Loanne Drilling, MD;  Location: [facility] ORS;  Service: Orthopedics;  Laterality: Left;   Social History   Socioeconomic History   Marital status: Married    Spouse name: Not on file   Number of children: Not on file   Years of education: Not on file   Highest education level: Bachelor's degree (e.g., BA, AB, BS)  Occupational History   Occupation: Retired  Tobacco Use   Smoking status: Never    Passive exposure: Never   Smokeless tobacco: Never  Substance and Sexual Activity   Alcohol use: No    Comment: former drinker   Drug use: No   Sexual activity: Yes  Other Topics Concern   Not on file  Social History  Narrative   Married. 1 son. 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       Hobbies: gym daily- riding exercise bike plus weights/machines, photography- used to do a lot of black and white, gardening   Social Drivers of Health   Financial Resource Strain: Low Risk  (12/10/2022)   Overall Financial Resource Strain (CARDIA)    Difficulty of Paying Living Expenses: Not hard at all  Food Insecurity: No Food Insecurity (12/10/2022)   Hunger Vital Sign    Worried About Running Out of Food in the Last Year: Never true    Ran Out of Food in the Last Year: Never true   Transportation Needs: No Transportation Needs (12/10/2022)   PRAPARE - Administrator, Civil Service (Medical): No    Lack of Transportation (Non-Medical): No  Physical Activity: Insufficiently Active (12/10/2022)   Exercise Vital Sign    Days of Exercise per Week: 3 days    Minutes of Exercise per Session: 40 min  Stress: No Stress Concern Present (12/10/2022)   [employer] of Occupational Health - Occupational Stress Questionnaire    Feeling of Stress : Only a little  Social Connections: Moderately Isolated (12/10/2022)   Social Connection and Isolation Panel [NHANES]    Frequency of Communication with Friends and Family: Once a week    Frequency of Social Gatherings with Friends and Family: Once a week    Attends Religious Services: Never    Database administrator or Organizations: Yes    Attends Engineer, structural: More than 4 times per year    Marital Status: Married   Allergies  Allergen Reactions   Amoxicillin Rash    Has patient had a PCN reaction causing immediate rash, facial/tongue/throat swelling, SOB or lightheadedness with hypotension:unsure Has patient had a PCN reaction causing severe rash involving mucus membranes or skin necrosis:unsure Has patient had a PCN reaction that required hospitalization No Has patient had a PCN reaction occurring within the last 10 years: No If all of the above answers are "NO", then may proceed with Cephalosporin use.    Family History  Problem Relation Age of Onset   Heart disease Father        in 73s- specifics unclear   Other Mother        passed in late 2s     Current Outpatient Medications (Cardiovascular):    rosuvastatin (CRESTOR) 5 MG tablet, Take 1 tablet (5 mg total) by mouth daily.  Current Outpatient Medications (Respiratory):    albuterol (VENTOLIN HFA) 108 (90 Base) MCG/ACT inhaler, Inhale 2 puffs into the lungs every 6 (six) hours as needed for wheezing or shortness of breath.  Current  Outpatient Medications (Analgesics):    acetaminophen (TYLENOL) 500 MG tablet, Take 500-1,000 mg by mouth every 6 (six) hours as needed (for pain.).    aspirin EC 81 MG tablet, Take 81 mg by mouth daily. Swallow whole.   celecoxib (CELEBREX) 100 MG capsule, Take 2 capsules (200 mg total) by mouth 2 (two) times daily.   Current Outpatient Medications (Other):    Multiple Vitamin (MULTIVITAMIN WITH MINERALS) TABS tablet, Take 1 tablet by mouth daily.   pregabalin (LYRICA) 50 MG capsule, Take 1 capsule (50 mg total) by mouth 3 (three) times daily.   tiZANidine (ZANAFLEX) 2 MG tablet, Take 1 tablet (2 mg total) by mouth every 6 (six) hours as needed for muscle spasms. (Patient not  taking: Reported on 12/14/2022)   Reviewed prior external information including notes and imaging from  primary care provider As well as notes that were available from care everywhere and other healthcare systems.  Past medical history, social, surgical and family history all reviewed in electronic medical record.  No pertanent information unless stated regarding to the chief complaint.   Review of Systems:  No headache, visual changes, nausea, vomiting, diarrhea, constipation, dizziness, abdominal pain, skin rash, fevers, chills, night sweats, weight loss, swollen lymph nodes, joint swelling, chest pain, shortness of breath, mood changes. POSITIVE muscle aches, body aches  Objective  Blood pressure 118/82, height 5\' 5"  (1.651 m), weight 137 lb (62.1 kg), SpO2 96%.   General: No apparent distress alert and oriented x3 mood and affect normal, dressed appropriately.  HEENT: Pupils equal, extraocular movements intact  Respiratory: Patient's speak in full sentences and does not appear short of breath  Cardiovascular: No lower extremity edema, non tender, no erythema  Severely antalgic gait noted, external rotation of the hips noted.  Does have some atrophy of the lower extremities.    Impression and Recommendations:      The above documentation has been reviewed and is accurate and complete Judi Saa, DO

## 2023-01-22 NOTE — Progress Notes (Addendum)
Assessment/Plan:     Ricky Alcauter. is a very pleasant 77 y.o. year old RH male with a history of hypertension, hyperlipidemia, carotid artery disease, PVD, history of stroke in February 2018 with residual mild sensory changes on left upper extremity, DDD/lumbar stenosis with chronic right low back pain followed by pain clinic, seen today for evaluation of memory loss. MoCA today is 26/30 . Workup is in progress. Etiology is unclear, perhaps there is a component of vascular disease. MRI brain will be performed in the near future , will follow the results, other workup is pending.Patient is able to participate on his ADLs and to drive without difficulties      Memory Impairment  MRI brain without contrast to assess for underlying structural abnormality and assess vascular load has been ordered by PCP, will follow results  Neurocognitive testing to further evaluate cognitive concerns and determine other underlying cause of memory changes, including potential contribution from sleep, anxiety, attention, or depression among others  Check B12, TSH Recommend good control of cardiovascular risk factors.   Continue to control mood as per PCP No indication for antidementia meds at this time Folllow up in 4 months prior to his scheduled neuropsych evaluation   Subjective:    The patient is here alone    How long did patient have memory difficulties?  He reported to his PCP episodes of "can't think clearly " for about for the last year. He initially thought it was due to age.  Needs more time to get the answer.  Patient reports some difficulty remembering new information, recent conversations, names. repeats oneself?  Endorsed Disoriented when walking into a room?  Patient denies  Leaving objects in unusual places?  Denies.   Wandering behavior? Denies.   Any personality changes, or depression, anxiety? Denies. Hallucinations or paranoia? Denies.   Seizures? Denies.    Any sleep changes?   Sleeps well "most of the time" Denies vivid dreams, REM behavior or sleepwalking   Sleep apnea? Denies.   Any hygiene concerns?  Denies.   Independent of bathing and dressing? Endorsed  Does the patient need help with medications? Patient is in charge   Who is in charge of the finances?  Patient is in charge     Any changes in appetite?   Denies.     Patient have trouble swallowing?  Denies.   Does the patient cook? "Because of my back I cannot stand too much so I don't cook as much"  Any headaches? Denies .   Chronic pain?  Endorsed, due to DDD and lumbar stenosis, sees orthopedics and physical therapy.  He is on Celebrex.  Ambulates with difficulty? Because of chronic back pain he has limited mobility.  "I can't barely take the dog out" Recent falls or head injuries? Denies. A couple of mechanical falls while walking his dog within the last 2 years.   Never had a head injury Vision changes?  Denies any new issues.  Has a history of  B cataract surgery  "I can see better and cars are brighter".  Any stroke like symptoms? No recent stroke symptoms.   Any tremors? Denies.   Any anosmia? Denies.   Any incontinence of urine? Denies.   Any bowel dysfunction? Denies.      Patient lives with his wife.  "I don't know if she noticed that I have a memory issue"  History of heavy alcohol intake? Denies.   History of heavy tobacco use? Denies.  Family history of dementia?  Denies  Does patient drive? Yes, denies getting lost.  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     Current Outpatient Medications  Medication Instructions   acetaminophen (TYLENOL) 500-1,000 mg, Every 6 hours PRN   albuterol (VENTOLIN HFA) 108 (90 Base) MCG/ACT inhaler 2 puffs, Inhalation, Every 6 hours PRN   amLODipine (NORVASC) 2.5 mg, Daily   aspirin EC 81 mg, Daily   atorvastatin (LIPITOR) 20 MG tablet Take by mouth.   celecoxib (CELEBREX) 200 mg, Oral, 2 times daily   Multiple Vitamin (MULTIVITAMIN WITH MINERALS) TABS tablet 1 tablet, Daily   pregabalin (LYRICA) 50 mg, Oral, 3 times daily   rosuvastatin (CRESTOR) 5 mg, Oral, Daily   tiZANidine (ZANAFLEX) 2 mg, Oral, Every 6 hours PRN     VITALS:   Vitals:   01/25/23 0945  BP: (!) 178/79  Pulse: 87  Resp: 20  SpO2: 99%  Height: 5\' 5"  (1.651 m)      PHYSICAL EXAM   HEENT:  Normocephalic, atraumatic.  The superficial temporal arteries are without ropiness or tenderness. Cardiovascular: Regular rate and rhythm. Lungs: Clear to auscultation bilaterally. Neck: There are no carotid bruits noted bilaterally.  NEUROLOGICAL:    01/25/2023    9:00 AM  Montreal Cognitive Assessment   Visuospatial/ Executive (0/5) 3  Naming (0/3) 3  Attention: Read list of digits (0/2) 2  Attention: Read list of letters (0/1) 1  Attention: Serial 7 subtraction starting at 100 (0/3) 3  Language: Repeat phrase (0/2) 2  Language : Fluency (0/1) 1  Abstraction (0/2) 1  Delayed Recall (0/5) 4  Orientation (0/6) 6  Total 26  Adjusted Score (based on education) 26       01/10/2017    8:37 AM  MMSE - Mini Mental State Exam  Not completed: --     Orientation:  Alert and oriented to person, place and  time . No aphasia or dysarthria. Fund of knowledge is appropriate. Recent and remote memory impaired.  Attention and concentration are normal.   Able to name objects and repeat phrases  Delayed recall 4/5  Cranial nerves: There  is good facial symmetry. Extraocular muscles are intact and visual fields are full to confrontational testing. Speech is fluent and clear. No tongue deviation. Hearing is intact to conversational tone.  Tone: Tone is good throughout. Sensation: Sensation is intact to light touch.  Vibration is intact at the bilateral big toe.  Coordination: The patient has no difficulty with RAM's or FNF bilaterally. Normal finger to nose  Motor: Strength is 5/5 in the bilateral upper and lower extremities. There is no pronator drift. There are no fasciculations noted. DTR's: Deep tendon reflexes are 2/4 bilaterally. Gait and Station: The patient is able to ambulate without difficulty. Gait is cautious and narrow. Stride length is normal but mildly flexed forward      Thank you for allowing [place] the opportunity to participate in the care of this nice patient. Please do not  hesitate to contact us for any questions or concerns.   Total time spent on today's visit was 73 minutes dedicated to this patient today, preparing to see patient, examining the patient, ordering tests and/or medications and counseling the patient, documenting clinical information in the EHR or other health record, independently interpreting results and communicating results to the patient/family, discussing treatment and goals, answering patient's questions and coordinating care.  Cc:  Shelva Majestic, MD  Marlowe Kays 01/25/2023 10:35 AM

## 2023-01-23 ENCOUNTER — Encounter: Payer: Self-pay | Admitting: Family Medicine

## 2023-01-23 ENCOUNTER — Ambulatory Visit: Payer: Medicare Other | Admitting: Family Medicine

## 2023-01-23 VITALS — BP 118/82 | Ht 65.0 in | Wt 137.0 lb

## 2023-01-23 DIAGNOSIS — M48061 Spinal stenosis, lumbar region without neurogenic claudication: Secondary | ICD-10-CM

## 2023-01-23 NOTE — Assessment & Plan Note (Signed)
Significant arthritic changes of the back noted.  Patient still has been trying to avoid any type of surgical intervention but we are running out of options at the moment.  Discussed with patient about icing regimen and home exercises, which activities to do and which ones to avoid.  Discussed with patient about the medial branch block, spinal stimulator and potential surgical intervention.  Patient would like to hold on any other significant changes at the moment secondary to being followed by neurology for some brain fog he is having at the moment.  We discussed with patient other activities that I think will be more beneficial.  Follow-up with me again in 12 weeks.  Total time with patient today 31 minutes

## 2023-01-23 NOTE — Patient Instructions (Signed)
See what MRI shows Options are MBB, seeing if you are candidate for spinal stim or surgical intervention If you have questions you know where I am See me in 3 months if you need me

## 2023-01-25 ENCOUNTER — Encounter: Payer: Self-pay | Admitting: Family Medicine

## 2023-01-25 ENCOUNTER — Other Ambulatory Visit: Payer: Medicare Other

## 2023-01-25 ENCOUNTER — Encounter: Payer: Self-pay | Admitting: Physician Assistant

## 2023-01-25 ENCOUNTER — Ambulatory Visit (INDEPENDENT_AMBULATORY_CARE_PROVIDER_SITE_OTHER): Payer: Medicare Other | Admitting: Physician Assistant

## 2023-01-25 VITALS — BP 178/79 | HR 87 | Resp 20 | Ht 65.0 in

## 2023-01-25 DIAGNOSIS — R413 Other amnesia: Secondary | ICD-10-CM | POA: Insufficient documentation

## 2023-01-25 NOTE — Patient Instructions (Addendum)
It was a pleasure to see you today at our office.   Recommendations:  Neurocognitive evaluation at our office   Check labs today [address] Follow up in 4 months    For psychiatric meds, mood meds: Please have your primary care physician manage these medications.  If you have any severe symptoms of a stroke, or other severe issues such as confusion,severe chills or fever, etc call 911 or go to the ER as you may need to be evaluated further     For assessment of decision of mental capacity and competency:  Call Dr. Erick Blinks, geriatric psychiatrist at [phone]  Counseling regarding caregiver distress, including caregiver depression, anxiety and issues regarding community resources, adult day care programs, adult living facilities, or memory care questions:  please contact your  Primary Doctor's Social Worker   Whom to call: Memory  decline, memory medications: Call our office [phone]    [email or web address]   RECOMMENDATIONS FOR ALL PATIENTS WITH MEMORY PROBLEMS: 1. Continue to exercise (Recommend 30 minutes of walking everyday, or 3 hours every week) 2. Increase social interactions - continue going to [place] and enjoy social gatherings with friends and family 3. Eat healthy, avoid fried foods and eat more fruits and vegetables 4. Maintain adequate blood pressure, blood sugar, and blood cholesterol level. Reducing the risk of stroke and cardiovascular disease also helps promoting better memory. 5. Avoid stressful situations. Live a simple life and avoid aggravations. Organize your time and prepare for the next day in anticipation. 6. Sleep well, avoid any interruptions of sleep and avoid any distractions in the bedroom that may interfere with adequate sleep quality 7. Avoid sugar, avoid sweets as there is a strong link between excessive sugar intake, diabetes, and cognitive impairment We discussed the Mediterranean  diet, which has been shown to help patients reduce the risk of progressive memory disorders and reduces cardiovascular risk. This includes eating fish, eat fruits and green leafy vegetables, nuts like almonds and hazelnuts, walnuts, and also use olive oil. Avoid fast foods and fried foods as much as possible. Avoid sweets and sugar as sugar use has been linked to worsening of memory function.  There is always a concern of gradual progression of memory problems. If this is the case, then we may need to adjust level of care according to patient needs. Support, both to the patient and caregiver, should then be put into place.      You have been referred for a neuropsychological evaluation (i.e., evaluation of memory and thinking abilities). Please bring someone with you to this appointment if possible, as it is helpful for the doctor to hear from both you and another adult who knows you well. Please bring eyeglasses and hearing aids if you wear them.    The evaluation will take approximately 3 hours and has two parts:   The first part is a clinical interview with the neuropsychologist (Dr. Milbert Coulter or Dr. Roseanne Reno). During the interview, the neuropsychologist will speak with you and the individual you brought to the appointment.    The second part of the evaluation is testing with the doctor's technician Annabelle Harman or Selena Batten). During the testing, the technician will ask you to remember different types of material, solve problems, and answer some questionnaires. Your family member will not be present for this portion of the evaluation.   Please note: We must reserve several hours of the neuropsychologist's time and the psychometrician's time for your evaluation appointment. As such, there is a No-Show fee of $  100. If you are unable to attend any of your appointments, please contact our office as soon as possible to reschedule.      DRIVING: Regarding driving, in patients with progressive memory problems, driving  will be impaired. We advise to have someone else do the driving if trouble finding directions or if minor accidents are reported. Independent driving assessment is available to determine safety of driving.   If you are interested in the driving assessment, you can contact the following:  [facility] [employer] in [place] [phone]  [facility] [phone]  [facility] [phone]  [name] [phone] or [phone]   FALL PRECAUTIONS: Be cautious when walking. Scan the area for obstacles that may increase the risk of trips and falls. When getting up in the mornings, sit up at the edge of the bed for a few minutes before getting out of bed. Consider elevating the bed at the head end to avoid drop of blood pressure when getting up. Walk always in a well-lit room (use night lights in the walls). Avoid area rugs or power cords from appliances in the middle of the walkways. Use a walker or a cane if necessary and consider physical therapy for balance exercise. Get your eyesight checked regularly.  FINANCIAL OVERSIGHT: Supervision, especially oversight when making financial decisions or transactions is also recommended.  HOME SAFETY: Consider the safety of the kitchen when operating appliances like stoves, microwave oven, and blender. Consider having supervision and share cooking responsibilities until no longer able to participate in those. Accidents with firearms and other hazards in the house should be identified and addressed as well.   ABILITY TO BE LEFT ALONE: If patient is unable to contact 911 operator, consider using LifeLine, or when the need is there, arrange for someone to stay with patients. Smoking is a fire hazard, consider supervision or cessation. Risk of wandering should be assessed by caregiver and if detected at any point, supervision and safe proof recommendations should be instituted.  MEDICATION SUPERVISION: Inability to  self-administer medication needs to be constantly addressed. Implement a mechanism to ensure safe administration of the medications.      Mediterranean Diet A Mediterranean diet refers to food and lifestyle choices that are based on the traditions of countries located on the [employer] [place]. This way of eating has been shown to help prevent certain conditions and improve outcomes for people who have chronic diseases, like kidney disease and heart disease. What are tips for following this plan? Lifestyle  Cook and eat meals together with your family, when possible. Drink enough fluid to keep your urine clear or pale yellow. Be physically active every day. This includes: Aerobic exercise like running or swimming. Leisure activities like gardening, walking, or housework. Get 7-8 hours of sleep each night. If recommended by your health care provider, drink red wine in moderation. This means 1 glass a day for nonpregnant women and 2 glasses a day for men. A glass of wine equals 5 oz (150 mL). Reading food labels  Check the serving size of packaged foods. For foods such as rice and pasta, the serving size refers to the amount of cooked product, not dry. Check the total fat in packaged foods. Avoid foods that have saturated fat or trans fats. Check the ingredients list for added sugars, such as corn syrup. Shopping  At the grocery store, buy most of your food from the areas near the walls of the store. This includes: Fresh fruits and vegetables (produce). Grains,  beans, nuts, and seeds. Some of these may be available in unpackaged forms or large amounts (in bulk). Fresh seafood. Poultry and eggs. Low-fat dairy products. Buy whole ingredients instead of prepackaged foods. Buy fresh fruits and vegetables in-season from local farmers markets. Buy frozen fruits and vegetables in resealable bags. If you do not have access to quality fresh seafood, buy precooked frozen shrimp or canned fish, such  as tuna, salmon, or sardines. Buy small amounts of raw or cooked vegetables, salads, or olives from the deli or salad bar at your store. Stock your pantry so you always have certain foods on hand, such as olive oil, canned tuna, canned tomatoes, rice, pasta, and beans. Cooking  Cook foods with extra-virgin olive oil instead of using butter or other vegetable oils. Have meat as a side dish, and have vegetables or grains as your main dish. This means having meat in small portions or adding small amounts of meat to foods like pasta or stew. Use beans or vegetables instead of meat in common dishes like chili or lasagna. Experiment with different cooking methods. Try roasting or broiling vegetables instead of steaming or sauteing them. Add frozen vegetables to soups, stews, pasta, or rice. Add nuts or seeds for added healthy fat at each meal. You can add these to yogurt, salads, or vegetable dishes. Marinate fish or vegetables using olive oil, lemon juice, garlic, and fresh herbs. Meal planning  Plan to eat 1 vegetarian meal one day each week. Try to work up to 2 vegetarian meals, if possible. Eat seafood 2 or more times a week. Have healthy snacks readily available, such as: Vegetable sticks with hummus. Greek yogurt. Fruit and nut trail mix. Eat balanced meals throughout the week. This includes: Fruit: 2-3 servings a day Vegetables: 4-5 servings a day Low-fat dairy: 2 servings a day Fish, poultry, or lean meat: 1 serving a day Beans and legumes: 2 or more servings a week Nuts and seeds: 1-2 servings a day Whole grains: 6-8 servings a day Extra-virgin olive oil: 3-4 servings a day Limit red meat and sweets to only a few servings a month What are my food choices? Mediterranean diet Recommended Grains: Whole-grain pasta. Brown rice. Bulgar wheat. Polenta. Couscous. Whole-wheat bread. Orpah Cobb. Vegetables: Artichokes. Beets. Broccoli. Cabbage. Carrots. Eggplant. Green beans. Chard.  Kale. Spinach. Onions. Leeks. Peas. Squash. Tomatoes. Peppers. Radishes. Fruits: Apples. Apricots. Avocado. Berries. Bananas. Cherries. Dates. Figs. Grapes. Lemons. Melon. Oranges. Peaches. Plums. Pomegranate. Meats and other protein foods: Beans. Almonds. Sunflower seeds. Pine nuts. Peanuts. Cod. Salmon. Scallops. Shrimp. Tuna. Tilapia. Clams. Oysters. Eggs. Dairy: Low-fat milk. Cheese. Greek yogurt. Beverages: Water. Red wine. Herbal tea. Fats and oils: Extra virgin olive oil. Avocado oil. Grape seed oil. Sweets and desserts: [place] yogurt with honey. Baked apples. Poached pears. Trail mix. Seasoning and other foods: Basil. Cilantro. Coriander. Cumin. Mint. Parsley. Sage. Rosemary. Tarragon. Garlic. Oregano. Thyme. Pepper. Balsalmic vinegar. Tahini. Hummus. Tomato sauce. Olives. Mushrooms. Limit these Grains: Prepackaged pasta or rice dishes. Prepackaged cereal with added sugar. Vegetables: Deep fried potatoes (french fries). Fruits: Fruit canned in syrup. Meats and other protein foods: Beef. Pork. Lamb. Poultry with skin. Hot dogs. Tomasa Blase. Dairy: Ice cream. Sour cream. Whole milk. Beverages: Juice. Sugar-sweetened soft drinks. Beer. Liquor and spirits. Fats and oils: Butter. Canola oil. Vegetable oil. Beef fat (tallow). Lard. Sweets and desserts: Cookies. Cakes. Pies. Candy. Seasoning and other foods: Mayonnaise. Premade sauces and marinades. The items listed may not be a complete list. Talk with your dietitian about  what dietary choices are right for you. Summary The Mediterranean diet includes both food and lifestyle choices. Eat a variety of fresh fruits and vegetables, beans, nuts, seeds, and whole grains. Limit the amount of red meat and sweets that you eat. Talk with your health care provider about whether it is safe for you to drink red wine in moderation. This means 1 glass a day for nonpregnant women and 2 glasses a day for men. A glass of wine equals 5 oz (150 mL). This information  is not intended to replace advice given to you by your health care provider. Make sure you discuss any questions you have with your health care provider. Document Released: 09/15/2015 Document Revised: 10/18/2015 Document Reviewed: 09/15/2015 Elsevier Interactive Patient Education  2017 [employer].

## 2023-01-26 LAB — TSH: TSH: 2.56 m[IU]/L (ref 0.40–4.50)

## 2023-01-26 LAB — VITAMIN B12: Vitamin B-12: 421 pg/mL (ref 200–1100)

## 2023-01-26 NOTE — Progress Notes (Signed)
Thyroid and B12 are normal, thanks

## 2023-01-28 ENCOUNTER — Ambulatory Visit
Admission: RE | Admit: 2023-01-28 | Discharge: 2023-01-28 | Disposition: A | Discharge status: Home / Self Care | Payer: Medicare Other | Source: Ambulatory Visit | Attending: Family Medicine | Admitting: Family Medicine

## 2023-01-28 DIAGNOSIS — H538 Other visual disturbances: Secondary | ICD-10-CM

## 2023-01-28 DIAGNOSIS — R410 Disorientation, unspecified: Secondary | ICD-10-CM | POA: Diagnosis not present

## 2023-01-28 MED ORDER — GADOPICLENOL 0.5 MMOL/ML IV SOLN
7.5000 mL | Freq: Once | INTRAVENOUS | Status: AC | PRN
  Administered 2023-01-28: 7.5 mL via INTRAVENOUS

## 2023-02-04 ENCOUNTER — Encounter: Payer: Self-pay | Admitting: Family Medicine

## 2023-02-11 ENCOUNTER — Encounter: Payer: Self-pay | Admitting: Physician Assistant

## 2023-02-15 ENCOUNTER — Other Ambulatory Visit: Payer: Self-pay | Admitting: Family Medicine

## 2023-02-25 ENCOUNTER — Other Ambulatory Visit: Payer: Self-pay

## 2023-02-25 DIAGNOSIS — I6523 Occlusion and stenosis of bilateral carotid arteries: Secondary | ICD-10-CM

## 2023-03-01 NOTE — Progress Notes (Deleted)
Close encounter

## 2023-03-04 ENCOUNTER — Other Ambulatory Visit: Payer: Self-pay | Admitting: Family Medicine

## 2023-03-07 ENCOUNTER — Ambulatory Visit: Payer: Medicare Other | Admitting: Physician Assistant

## 2023-03-07 ENCOUNTER — Ambulatory Visit (HOSPITAL_COMMUNITY)
Admission: RE | Admit: 2023-03-07 | Discharge: 2023-03-07 | Disposition: A | Discharge status: Home / Self Care | Payer: Medicare Other | Source: Ambulatory Visit | Attending: Vascular Surgery | Admitting: Vascular Surgery

## 2023-03-07 VITALS — BP 158/85 | HR 60 | Temp 98.2°F | Resp 18 | Ht 65.0 in | Wt 139.1 lb

## 2023-03-07 DIAGNOSIS — Z9889 Other specified postprocedural states: Secondary | ICD-10-CM

## 2023-03-07 DIAGNOSIS — I6523 Occlusion and stenosis of bilateral carotid arteries: Secondary | ICD-10-CM

## 2023-03-07 NOTE — Progress Notes (Signed)
Office Note     CC:  follow up Requesting Provider:  Shelva Majestic, MD  HPI: Ricky [name]. is a 78 y.o. (14-Mar-1945) male who presents for routine follow up of carotid artery stenosis. He has remote history of right CEA on 03/19/2016 by Dr. Darrick Penna. This was performed secondary to symptomatic stenosis with CVA with left upper extremity weakness. He has been without any neurological symptoms on follow up.  He was last seen 1 year ago. He reports that since his last visit he has had some brain cloudiness. He says he doesn't really describe it as memory issues other than the sort of usual forgetfulness. He is seeing Neurology for workup of this. He otherwise denies any amaurosis. He does feel that he has some visual changes following his cataract surgery, he has been trying to get into see his Opthalmologist. He denies any speech difficulties, facial drooping, or unilateral upper or lower extremity weakness or numbness. He does feel that overall he has lower tolerance for activity than he once did. He says he still goes to the gym and gets on stationary bike and gets in the pool several days a week but he has stepped back from any weights. He explains that as a result he has noticed a lot of muscle loss. He also gets very tired after the gym unlike what  he use to be able to tolerate.   The pt is on a statin for cholesterol management.  The pt is on a daily aspirin. Other AC:  none The pt is on CCB for hypertension.   The pt does not have diabetes Tobacco hx:  never  Past Medical History:  Diagnosis Date   Arthritis    Carotid artery occlusion    Complication of anesthesia    slow to wake up after shoulder surgeries   Hyperlipidemia 03/08/2016   LDL 75 pre stroke. Goal <70 so on atorvastatin 20mg  every other day   Hypertension    Peripheral vascular disease (HCC)    Pneumonia    Stroke [facility])    still has decreased sensation in left hand and arm (as of 03/09/16)    Past Surgical  History:  Procedure Laterality Date   CAROTID ENDARTERECTOMY     CATARACT EXTRACTION W/ INTRAOCULAR LENS IMPLANT Bilateral 2024   ENDARTERECTOMY Right 03/19/2016   Procedure: ENDARTERECTOMY CAROTID;  Surgeon: Sherren Kerns, MD;  Location: [facility] OR;  Service: Vascular;  Laterality: Right;   PATCH ANGIOPLASTY Right 03/19/2016   Procedure: PATCH ANGIOPLASTY OF RIGHT CAROTID USING HEMASHIELD PLATINUM FINESSE;  Surgeon: Sherren Kerns, MD;  Location: [facility] OR;  Service: Vascular;  Laterality: Right;   SHOULDER SURGERY Bilateral    left- injections intermittent now  Rotator cuff repair on both shoulders, 2 different surgeries   TONSILLECTOMY     TOTAL KNEE ARTHROPLASTY Left 12/14/2013   Procedure: LEFT TOTAL KNEE ARTHROPLASTY;  Surgeon: Loanne Drilling, MD;  Location: [facility] ORS;  Service: Orthopedics;  Laterality: Left;    Social History   Socioeconomic History   Marital status: Married    Spouse name: Not on file   Number of children: Not on file   Years of education: Not on file   Highest education level: Bachelor's degree (e.g., BA, AB, BS)  Occupational History   Occupation: Retired  Tobacco Use   Smoking status: Never    Passive exposure: Never   Smokeless tobacco: Never  Vaping Use   Vaping status: Never Used  Substance and Sexual  Activity   Alcohol use: No    Comment: former drinker   Drug use: No   Sexual activity: Yes  Other Topics Concern   Not on file  Social History Narrative   Married. 1 son. 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       Hobbies: gym daily- riding exercise bike plus weights/machines, photography- used to do a lot of black and white, gardening   Left handed   Drinks coffee and caffeine   Two level home   Social Drivers of Health   Financial Resource Strain: Low Risk  (12/10/2022)   Overall Financial Resource Strain (CARDIA)    Difficulty of Paying Living Expenses: Not hard at all  Food  Insecurity: No Food Insecurity (12/10/2022)   Hunger Vital Sign    Worried About Running Out of Food in the Last Year: Never true    Ran Out of Food in the Last Year: Never true  Transportation Needs: No Transportation Needs (12/10/2022)   PRAPARE - Administrator, Civil Service (Medical): No    Lack of Transportation (Non-Medical): No  Physical Activity: Insufficiently Active (12/10/2022)   Exercise Vital Sign    Days of Exercise per Week: 3 days    Minutes of Exercise per Session: 40 min  Stress: No Stress Concern Present (12/10/2022)   [employer] of Occupational Health - Occupational Stress Questionnaire    Feeling of Stress : Only a little  Social Connections: Moderately Isolated (12/10/2022)   Social Connection and Isolation Panel [NHANES]    Frequency of Communication with Friends and Family: Once a week    Frequency of Social Gatherings with Friends and Family: Once a week    Attends Religious Services: Never    Database administrator or Organizations: Yes    Attends Engineer, structural: More than 4 times per year    Marital Status: Married  Catering manager Violence: Not At Risk (05/08/2022)   Humiliation, Afraid, Rape, and Kick questionnaire    Fear of Current or Ex-Partner: No    Emotionally Abused: No    Physically Abused: No    Sexually Abused: No    Family History  Problem Relation Age of Onset   Heart disease Father        in 43s- specifics unclear   Other Mother        passed in late 18s    Current Outpatient Medications  Medication Sig Dispense Refill   acetaminophen (TYLENOL) 500 MG tablet Take 500-1,000 mg by mouth every 6 (six) hours as needed (for pain.).      albuterol (VENTOLIN HFA) 108 (90 Base) MCG/ACT inhaler Inhale 2 puffs into the lungs every 6 (six) hours as needed for wheezing or shortness of breath. 1 each 0   amLODipine (NORVASC) 2.5 MG tablet TAKE 1 TABLET BY MOUTH EVERY DAY 90 tablet 1   aspirin EC 81 MG tablet Take 81  mg by mouth daily. Swallow whole.     celecoxib (CELEBREX) 100 MG capsule Take 2 capsules (200 mg total) by mouth 2 (two) times daily. 120 capsule 1   celecoxib (CELEBREX) 200 MG capsule TAKE 1 CAPSULE BY MOUTH TWICE A DAY 60 capsule 0   Multiple Vitamin (MULTIVITAMIN WITH MINERALS) TABS tablet Take 1 tablet by mouth daily.     rosuvastatin (CRESTOR) 5 MG tablet Take 1 tablet (5 mg total) by mouth daily. 90 tablet 3  atorvastatin (LIPITOR) 20 MG tablet Take by mouth.     pregabalin (LYRICA) 50 MG capsule Take 1 capsule (50 mg total) by mouth 3 (three) times daily. 90 capsule 1   tiZANidine (ZANAFLEX) 2 MG tablet Take 1 tablet (2 mg total) by mouth every 6 (six) hours as needed for muscle spasms. (Patient not taking: Reported on 12/14/2022) 100 tablet 1   No current facility-administered medications for this visit.    Allergies  Allergen Reactions   Amoxicillin Rash    Has patient had a PCN reaction causing immediate rash, facial/tongue/throat swelling, SOB or lightheadedness with hypotension:unsure Has patient had a PCN reaction causing severe rash involving mucus membranes or skin necrosis:unsure Has patient had a PCN reaction that required hospitalization No Has patient had a PCN reaction occurring within the last 10 years: No If all of the above answers are "NO", then may proceed with Cephalosporin use.      REVIEW OF SYSTEMS:  [X]  denotes positive finding, [ ]  denotes negative finding Cardiac  Comments:  Chest pain or chest pressure:    Shortness of breath upon exertion:    Short of breath when lying flat:    Irregular heart rhythm:        Vascular    Pain in calf, thigh, or hip brought on by ambulation:    Pain in feet at night that wakes you up from your sleep:     Blood clot in your veins:    Leg swelling:         Pulmonary    Oxygen at home:    Productive cough:     Wheezing:         Neurologic    Sudden weakness in arms or legs:     Sudden numbness in arms or  legs:     Sudden onset of difficulty speaking or slurred speech:    Temporary loss of vision in one eye:     Problems with dizziness:         Gastrointestinal    Blood in stool:     Vomited blood:         Genitourinary    Burning when urinating:     Blood in urine:        Psychiatric    Major depression:         Hematologic    Bleeding problems:    Problems with blood clotting too easily:        Skin    Rashes or ulcers:        Constitutional    Fever or chills:      PHYSICAL EXAMINATION:  Vitals:   03/07/23 1442  BP: (!) 158/88  Pulse: 60  Resp: 18  Temp: 98.2 F (36.8 C)  TempSrc: Temporal  SpO2: 96%  Weight: 139 lb 1.6 oz (63.1 kg)  Height: 5\' 5"  (1.651 m)   General:  WDWN in NAD; vital signs documented above Gait: Normal HENT: WNL, normocephalic Pulmonary: normal non-labored breathing without  wheezing Cardiac: regular HR Vascular Exam/Pulses: 2+ radial pulses, 2+ DP pulses bilaterally. Feet warm and well perfused Extremities: without ischemic changes, without Gangrene , without cellulitis; without open wounds Musculoskeletal: no muscle wasting or atrophy  Neurologic: A&O X 3 Psychiatric:  The pt has Normal affect.   Non-Invasive Vascular Imaging:   VAS Carotid Duplex: Summary:  Right Carotid: Velocities in the right ICA are consistent with a 1-39% stenosis.   Left Carotid: Velocities in the left ICA are  consistent with a 1-39% stenosis.   Vertebrals: Bilateral vertebral arteries demonstrate antegrade flow.    ASSESSMENT/PLAN:: 78 y.o. male here for follow up of carotid artery stenosis. He has remote history of right CEA on 03/19/2016 by Dr. Darrick Penna. This was performed secondary to symptomatic stenosis with CVA with left upper extremity weakness. He remains without any TIA or stroke like symptoms. He will continue to follow up with neurology for further evaluation of some of his recent memory issues.  - Duplex today shows stable 1-39% stenosis  bilateral ICA's. Normal flow in the subclavian and vertebral arteries bilaterally - reviewed signs and symptoms of stroke with pt and he understands should he develop any of these sx, he will go to the nearest ER or call 911.  - Continue Aspirin and Statin - He will follow up in 1 year with carotid duplex   Graceann Congress, PA-C [facility] [phone]  Clinic MD:   Karin Lieu

## 2023-03-11 ENCOUNTER — Ambulatory Visit (INDEPENDENT_AMBULATORY_CARE_PROVIDER_SITE_OTHER): Payer: Medicare Other | Admitting: Family Medicine

## 2023-03-11 ENCOUNTER — Encounter: Payer: Self-pay | Admitting: Family Medicine

## 2023-03-11 VITALS — BP 136/80 | HR 67 | Temp 97.3°F | Ht 65.0 in | Wt 136.8 lb

## 2023-03-11 DIAGNOSIS — R531 Weakness: Secondary | ICD-10-CM | POA: Diagnosis not present

## 2023-03-11 DIAGNOSIS — E785 Hyperlipidemia, unspecified: Secondary | ICD-10-CM | POA: Diagnosis not present

## 2023-03-11 DIAGNOSIS — I10 Essential (primary) hypertension: Secondary | ICD-10-CM | POA: Diagnosis not present

## 2023-03-11 DIAGNOSIS — R739 Hyperglycemia, unspecified: Secondary | ICD-10-CM | POA: Diagnosis not present

## 2023-03-11 DIAGNOSIS — R4189 Other symptoms and signs involving cognitive functions and awareness: Secondary | ICD-10-CM

## 2023-03-11 DIAGNOSIS — R410 Disorientation, unspecified: Secondary | ICD-10-CM | POA: Diagnosis not present

## 2023-03-11 DIAGNOSIS — Z131 Encounter for screening for diabetes mellitus: Secondary | ICD-10-CM | POA: Diagnosis not present

## 2023-03-11 DIAGNOSIS — J439 Emphysema, unspecified: Secondary | ICD-10-CM | POA: Diagnosis not present

## 2023-03-11 DIAGNOSIS — I69359 Hemiplegia and hemiparesis following cerebral infarction affecting unspecified side: Secondary | ICD-10-CM

## 2023-03-11 LAB — CBC WITH DIFFERENTIAL/PLATELET
Basophils Absolute: 0 10*3/uL (ref 0.0–0.1)
Basophils Relative: 0.3 % (ref 0.0–3.0)
Eosinophils Absolute: 0 10*3/uL (ref 0.0–0.7)
Eosinophils Relative: 1.2 % (ref 0.0–5.0)
HCT: 40.8 % (ref 39.0–52.0)
Hemoglobin: 13.6 g/dL (ref 13.0–17.0)
Lymphocytes Relative: 18.9 % (ref 12.0–46.0)
Lymphs Abs: 0.8 10*3/uL (ref 0.7–4.0)
MCHC: 33.3 g/dL (ref 30.0–36.0)
MCV: 96.2 fL (ref 78.0–100.0)
Monocytes Absolute: 0.4 10*3/uL (ref 0.1–1.0)
Monocytes Relative: 9 % (ref 3.0–12.0)
Neutro Abs: 3 10*3/uL (ref 1.4–7.7)
Neutrophils Relative %: 70.6 % (ref 43.0–77.0)
Platelets: 225 10*3/uL (ref 150.0–400.0)
RBC: 4.24 Mil/uL (ref 4.22–5.81)
RDW: 14 % (ref 11.5–15.5)
WBC: 4.2 10*3/uL (ref 4.0–10.5)

## 2023-03-11 LAB — COMPREHENSIVE METABOLIC PANEL
ALT: 20 U/L (ref 0–53)
AST: 28 U/L (ref 0–37)
Albumin: 4 g/dL (ref 3.5–5.2)
Alkaline Phosphatase: 60 U/L (ref 39–117)
BUN: 24 mg/dL — ABNORMAL HIGH (ref 6–23)
CO2: 26 meq/L (ref 19–32)
Calcium: 8.8 mg/dL (ref 8.4–10.5)
Chloride: 106 meq/L (ref 96–112)
Creatinine, Ser: 0.76 mg/dL (ref 0.40–1.50)
GFR: 85.62 mL/min (ref >60.00)
Glucose, Bld: 98 mg/dL (ref 70–99)
Potassium: 4.2 meq/L (ref 3.5–5.1)
Sodium: 139 meq/L (ref 135–145)
Total Bilirubin: 0.6 mg/dL (ref 0.2–1.2)
Total Protein: 6.3 g/dL (ref 6.0–8.3)

## 2023-03-11 LAB — HEMOGLOBIN A1C: Hgb A1c MFr Bld: 5.9 % (ref 4.6–6.5)

## 2023-03-11 LAB — LIPID PANEL
Cholesterol: 108 mg/dL (ref 0–200)
HDL: 64.8 mg/dL (ref >39.00)
LDL Cholesterol: 35 mg/dL (ref 0–99)
NonHDL: 43.25
Total CHOL/HDL Ratio: 2
Triglycerides: 40 mg/dL (ref 0.0–149.0)
VLDL: 8 mg/dL (ref 0.0–40.0)

## 2023-03-11 NOTE — Progress Notes (Signed)
Phone [phone] In person visit   Subjective:   Ricky [name]. is a 78 y.o. year old very pleasant male patient who presents for/with See problem oriented charting Chief Complaint  Patient presents with   Back Pain   Weakness   Fatigue   Past Medical History-  Patient Active Problem List   Diagnosis Date Noted   Hemiparesis affecting dominant side as late effect of stroke (HCC) 03/08/2016    Priority: High   History of CVA (cerebrovascular accident) 02/28/2016    Priority: High   Emphysema lung (HCC) 08/25/2021    Priority: Medium    Hyperglycemia 08/25/2021    Priority: Medium    Carotid artery stenosis, symptomatic (Right) 03/19/2016    Priority: Medium    Hyperlipidemia 03/08/2016    Priority: Medium    Essential hypertension 12/20/2014    Priority: Medium    Senile purpura (HCC) 08/25/2021    Priority: Low   DDD (degenerative disc disease), lumbar 02/09/2021    Priority: Low   Acquired leg length discrepancy 02/09/2021    Priority: Low   Leukopenia 01/08/2017    Priority: Low   Trigger middle finger of hand (Left) 11/12/2016    Priority: Low   OA (osteoarthritis) of knee 12/14/2013    Priority: Low   Memory impairment 01/25/2023   Lumbar facet joint pain 11/18/2022   Spondylosis without myelopathy or radiculopathy, lumbosacral region 11/18/2022   Grade 1 Retrolisthesis of L1/L2, L2/L3, & L3/L4 10/25/2022   Lumbar facet arthropathy (Multilevel) (Bilateral) 10/25/2022   Lumbar foraminal stenosis (Bilateral: L1-2, L2-3, L3-4, L4-5) (Right - SEVERE: L4-5) 10/25/2022   Lumbar lateral recess stenosis (Bilateral: L3-4, L4-5) (Right - SEVERE: L4-5) 10/25/2022   Ligamentum flavum hypertrophy (L3-4, L4-5, L5-S1) 10/25/2022   Chronic low back pain (1ry area of Pain) (Bilateral) w/o sciatica 10/25/2022   Grade 2 Anterolisthesis (7mm) of lumbar spine (L4/L5) 10/24/2022   DDD (degenerative disc disease), lumbosacral 10/24/2022   Dextroscoliosis of lumbar spine  10/24/2022   Abnormal MRI, lumbar spine (05/19/2021) 10/24/2022   Spondylolisthesis of lumbar region 10/24/2022   Chronic pain syndrome 10/22/2022   Pharmacologic therapy 10/22/2022   Disorder of skeletal system 10/22/2022   Problems influencing health status 10/22/2022    Medications- reviewed and updated Current Outpatient Medications  Medication Sig Dispense Refill   acetaminophen (TYLENOL) 500 MG tablet Take 500-1,000 mg by mouth every 6 (six) hours as needed (for pain.).      albuterol (VENTOLIN HFA) 108 (90 Base) MCG/ACT inhaler Inhale 2 puffs into the lungs every 6 (six) hours as needed for wheezing or shortness of breath. 1 each 0   amLODipine (NORVASC) 2.5 MG tablet TAKE 1 TABLET BY MOUTH EVERY DAY 90 tablet 1   aspirin EC 81 MG tablet Take 81 mg by mouth daily. Swallow whole.     celecoxib (CELEBREX) 100 MG capsule Take 2 capsules (200 mg total) by mouth 2 (two) times daily. 120 capsule 1   celecoxib (CELEBREX) 200 MG capsule TAKE 1 CAPSULE BY MOUTH TWICE A DAY 60 capsule 0   Multiple Vitamin (MULTIVITAMIN WITH MINERALS) TABS tablet Take 1 tablet by mouth daily.     rosuvastatin (CRESTOR) 5 MG tablet Take 1 tablet (5 mg total) by mouth daily. 90 tablet 3   atorvastatin (LIPITOR) 20 MG tablet Take by mouth.     No current facility-administered medications for this visit.     Objective:  BP 136/80   Pulse 67   Temp (!) 97.3 F (36.3 C)  Ht 5\' 5"  (1.651 m)   Wt 136 lb 12.8 oz (62.1 kg)   SpO2 99%   BMI 22.76 kg/m  Gen: NAD, resting comfortably CV: RRR no murmurs rubs or gallops Lungs: CTAB no crackles, wheeze, rhonchi Ext: no edema Skin: warm, dry    Assessment and Plan   # Foggy brain/being out of it sensation S: Has had evaluation by neurology with reassuring MRI of the brain in 2024 -See note on brain/muscle weakness from 08/28/2022 if worsening -  TSH and B12 normal in past  A/P: ongoing issues without change- reassuring workup so far with neurology including  MRI brain . Has upcoming exam as well  #hypertension S: medication: amlodipine 7.5 mg--> 5--> 2.5 mg daily (has tried to come off but had to restart) Home readings #s: has home cuff - sometimes #s high but trends down on repeat with rest- on average seems to be <135/85  BP Readings from Last 3 Encounters:  03/11/23 136/80  03/07/23 (!) 158/85  01/25/23 (!) 178/79  A/P: blood pressure improved back on amlodipine 2.5 mg- most recent home reading from yesterday 127/80   # history of CVA with hemiparesis affecting dominant side (left) as a result  # carotid stenosis-  carotid stenosis of right side status post endarterectomy on the right with 1-39% on the left followed by Dr. Darrick Penna- recently checked and stabel #hyperlipidemia - LDL goal below 70 S: Medication:Atorvastatin 20 mg every other day but we changed to rosuvastatin 5 mg to see if this would help with memory-no obvious change, aspirin 81 mg  - occasionally drops objects with left hand (left handed)   Lab Results  Component Value Date   CHOL 115 02/26/2022   HDL 57.90 02/26/2022   LDLCALC 47 02/26/2022   LDLDIRECT 44.0 08/03/2019   TRIG 48.0 02/26/2022   CHOLHDL 2 02/26/2022  A/P: history of CVA with ongoing weakness in his dominant hand on the left- continue to control risk factors- hopefully LDL under 70 on rosuvastatin 5 mg but has not seen memory improvement with the change-continue current medication for now but update lipid panel to make sure LDL still under 70  # Hyperglycemia/insulin resistance/prediabetes- peak a1c 103 08/03/19.  Peak A1c 5.9 February 17, 2020 S:  Medication: none  Lab Results  Component Value Date   HGBA1C 5.9 08/01/2022   HGBA1C 5.9 02/26/2022   HGBA1C 5.9 08/25/2021  A/P: hopefully stable- update a1c today. Continue without meds for now     #Mild leukopenia-other cell lines were normal until last visit when he had slight anemia as well as slightly low platelets.Plan at prior t was CBC with  differential as well as pathology smear review - largely reassuring then reverted to primarily leukopenia. Last visit hemoglobin slightly low- monitor today Lab Results  Component Value Date   WBC 4.6 08/01/2022   HGB 12.8 (L) 08/01/2022   HCT 38.7 (L) 08/01/2022   MCV 93.8 08/01/2022   PLT 254.0 08/01/2022   #emphysema- incidental finding on imaging - largely asymptomatic   -has albuterol available if needed  #nocturia- we did a later PSA due to worsening issues and low risk PSA trend. Nitrites in urie- ordered culture 02/17/20 Lab Results  Component Value Date   PSA 0.46 02/17/2021   PSA 0.87 08/03/2019   PSA 1.22 11/30/2013  -even lower in 2023  # Degenerative disc disease/lumbar stenosis-working with Dr. Katrinka Blazing with last visit 10/03/2021-Cymbalta was increased to 40 mg and he was referred to neurosurgery Dr. Jake Samples -  he came off cymbalta and  gabapentin 300mg  twice a day with concern could be affecting mental fog-still off but wonders about restarting -Lyrica and tizanidine trial 11/01/2022 - he never started concerned about side effects -Celebrex trial through Dr. Katrinka Blazing 100 mg twice daily on regular basis with up to 200 mg twice a day with flares-he is not sure if this is helping -She was planning to see acupuncturist but has not done so yet -some relief with cart- may try walker -She complains of feeling Tired/weak/loss of muscle mass-possible testosterone issue we wondered but he feels it is harder for him to workout due to back pain and feels that is the primary issue. 10 minutes with some resistance on bike and doing some weights. Also doing water walking laps   still pending options: -Dr. Katrinka Blazing Spinal stimulator has been considered  - considering acupuncture- gave him name for [facility] - Dr. Laban Emperor proposes multiple site branch block x2 with ablation if successful  -Lyrica and tizanidine trial 11/01/2022 but has not tried yet  From AVS:  " Back plan [place] option for  acupuncture 2. Celebrex- can trial week on and week off and compare to see if making a difference (Use a journal)- if no improvement in average daily pain scores or in level of activity- may stop completely 3. Lets try walker since using a cart takes some pressure off- medical supply store can help"  Recommended follow up: Return in about 3 months (around 06/08/2023) for followup or sooner if needed.Schedule b4 you leave. Future Appointments  Date Time Provider Department Center  04/17/2023 10:45 AM Judi Saa, DO [facility] None  05/14/2023  9:00 AM [facility] ANNUAL WELLNESS VISIT 1 [facility]  05/30/2023  9:30 AM Marcos Eke, PA-C [facility] None  11/22/2023  8:30 AM Rosann Auerbach, PhD [facility] None  11/22/2023  9:30 AM [facility]SYCH TECH [facility] None  11/29/2023 10:00 AM Rosann Auerbach, PhD [facility] None    Lab/Order associations: banana, 1 small piece of [place]   ICD-10-CM   1. Essential hypertension  I10     2. Hyperlipidemia, unspecified hyperlipidemia type  E78.5 Lipid panel    CBC with Differential/Platelet    Comprehensive metabolic panel    3. Brain fog  R41.89     4. Hyperglycemia  R73.9 HgB A1c    5. Screening for diabetes mellitus  Z13.1 HgB A1c    6. Weakness  R53.1     7. Confusion  R41.0     8. Hemiparesis affecting dominant side as late effect of stroke (HCC) Chronic I69.359     9. Pulmonary emphysema, unspecified emphysema type (HCC) Chronic J43.9       No orders of the defined types were placed in this encounter.   Return precautions advised.  Tana Conch, MD

## 2023-03-11 NOTE — Patient Instructions (Addendum)
Back plan [place] option for acupuncture 2. Celebrex- can trial week on and week off and compare to see if making a difference (Use a journal)- if no improvement in average daily pain scores or in level of activity- may stop completely 3. Lets try walker since using a cart takes some pressure off- medical supply store can help  Please stop by lab before you go If you have mychart- we will send your results within 3 business days of [place] receiving them.  If you do not have mychart- we will call you about results within 5 business days of [place] receiving them.  *please also note that you will see labs on mychart as soon as they post. I will later go in and write notes on them- will say "notes from Dr. Durene Cal"   Recommended follow up: Return in about 3 months (around 06/08/2023) for followup or sooner if needed.Schedule b4 you leave.

## 2023-03-18 ENCOUNTER — Ambulatory Visit: Payer: Medicare Other | Admitting: Family Medicine

## 2023-03-19 ENCOUNTER — Other Ambulatory Visit: Payer: Self-pay | Admitting: Family Medicine

## 2023-03-21 ENCOUNTER — Other Ambulatory Visit: Payer: Self-pay

## 2023-03-21 DIAGNOSIS — I6523 Occlusion and stenosis of bilateral carotid arteries: Secondary | ICD-10-CM

## 2023-04-16 NOTE — Progress Notes (Unsigned)
 [facility] [address] [place] [address] Phone: [phone] Subjective:   Ricky [name], am serving as a scribe for Dr. Antoine Primas.  I'm seeing this patient by the request  of:  Shelva Majestic, MD  CC: back and knee pain   [id number]  01/23/2023 Significant arthritic changes of the back noted.  Patient still has been trying to avoid any type of surgical intervention but we are running out of options at the moment.  Discussed with patient about icing regimen and home exercises, which activities to do and which ones to avoid.  Discussed with patient about the medial branch block, spinal stimulator and potential surgical intervention.  Patient would like to hold on any other significant changes at the moment secondary to being followed by neurology for some brain fog he is having at the moment.  We discussed with patient other activities that I think will be more beneficial.  Follow-up with me again in 12 weeks.  Total time with patient today 31 minutes     Update 04/17/2023 Ricky [name]. is a 78 y.o. male coming in with complaint of lumbar spine pain. Had MRI brain on 01/28/23. Patient states that his pain is worse. Did a lot of vacuuming yesterday and this exacerbated his pain. Denies any radicular symptoms. Has been doing pool walking for 30 minutes each time. Biking and machine work increases his pain.   Patient still complaining of fuzzy thinking.      Past Medical History:  Diagnosis Date   Arthritis    Carotid artery occlusion    Complication of anesthesia    slow to wake up after shoulder surgeries   Hyperlipidemia 03/08/2016   LDL 75 pre stroke. Goal <70 so on atorvastatin 20mg  every other day   Hypertension    Peripheral vascular disease (HCC)    Pneumonia    Stroke [facility])    still has decreased sensation in left hand and arm (as of 03/09/16)   Past Surgical History:  Procedure Laterality Date   CAROTID ENDARTERECTOMY      CATARACT EXTRACTION W/ INTRAOCULAR LENS IMPLANT Bilateral 2024   ENDARTERECTOMY Right 03/19/2016   Procedure: ENDARTERECTOMY CAROTID;  Surgeon: Sherren Kerns, MD;  [facility];  Service: Vascular;  Laterality: Right;   PATCH ANGIOPLASTY Right 03/19/2016   Procedure: PATCH ANGIOPLASTY OF RIGHT CAROTID USING HEMASHIELD PLATINUM FINESSE;  Surgeon: Sherren Kerns, MD;  Location: [facility] OR;  Service: Vascular;  Laterality: Right;   SHOULDER SURGERY Bilateral    left- injections intermittent now  Rotator cuff repair on both shoulders, 2 different surgeries   TONSILLECTOMY     TOTAL KNEE ARTHROPLASTY Left 12/14/2013   Procedure: LEFT TOTAL KNEE ARTHROPLASTY;  Surgeon: Loanne Drilling, MD;  Location: [facility] ORS;  Service: Orthopedics;  Laterality: Left;   Social History   Socioeconomic History   Marital status: Married    Spouse name: Not on file   Number of children: Not on file   Years of education: Not on file   Highest education level: Bachelor's degree (e.g., BA, AB, BS)  Occupational History   Occupation: Retired  Tobacco Use   Smoking status: Never    Passive exposure: Never   Smokeless tobacco: Never  Vaping Use   Vaping status: Never Used  Substance and Sexual Activity   Alcohol use: No    Comment: former drinker   Drug use: No   Sexual activity: Yes  Other Topics Concern  Not on file  Social History Narrative   Married. 1 son. 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       Hobbies: gym daily- riding exercise bike plus weights/machines, photography- used to do a lot of black and white, gardening   Left handed   Drinks coffee and caffeine   Two level home   Social Drivers of Health   Financial Resource Strain: Low Risk  (03/07/2023)   Overall Financial Resource Strain (CARDIA)    Difficulty of Paying Living Expenses: Not hard at all  Food Insecurity: No Food Insecurity (03/07/2023)   Hunger Vital Sign     Worried About Running Out of Food in the Last Year: Never true    Ran Out of Food in the Last Year: Never true  Transportation Needs: No Transportation Needs (03/07/2023)   PRAPARE - Administrator, Civil Service (Medical): No    Lack of Transportation (Non-Medical): No  Physical Activity: Insufficiently Active (03/07/2023)   Exercise Vital Sign    Days of Exercise per Week: 4 days    Minutes of Exercise per Session: 30 min  Stress: No Stress Concern Present (03/07/2023)   [employer] of Occupational Health - Occupational Stress Questionnaire    Feeling of Stress : Only a little  Social Connections: Moderately Isolated (03/07/2023)   Social Connection and Isolation Panel [NHANES]    Frequency of Communication with Friends and Family: Once a week    Frequency of Social Gatherings with Friends and Family: Once a week    Attends Religious Services: Never    Database administrator or Organizations: Yes    Attends Engineer, structural: More than 4 times per year    Marital Status: Married   Allergies  Allergen Reactions   Amoxicillin Rash    Has patient had a PCN reaction causing immediate rash, facial/tongue/throat swelling, SOB or lightheadedness with hypotension:unsure Has patient had a PCN reaction causing severe rash involving mucus membranes or skin necrosis:unsure Has patient had a PCN reaction that required hospitalization No Has patient had a PCN reaction occurring within the last 10 years: No If all of the above answers are "NO", then may proceed with Cephalosporin use.    Family History  Problem Relation Age of Onset   Heart disease Father        in 42s- specifics unclear   Other Mother        passed in late 50s     Current Outpatient Medications (Cardiovascular):    amLODipine (NORVASC) 2.5 MG tablet, TAKE 1 TABLET BY MOUTH EVERY DAY   rosuvastatin (CRESTOR) 5 MG tablet, Take 1 tablet (5 mg total) by mouth daily.  Current Outpatient  Medications (Respiratory):    albuterol (VENTOLIN HFA) 108 (90 Base) MCG/ACT inhaler, Inhale 2 puffs into the lungs every 6 (six) hours as needed for wheezing or shortness of breath.  Current Outpatient Medications (Analgesics):    acetaminophen (TYLENOL) 500 MG tablet, Take 500-1,000 mg by mouth every 6 (six) hours as needed (for pain.).    aspirin EC 81 MG tablet, Take 81 mg by mouth daily. Swallow whole.   celecoxib (CELEBREX) 100 MG capsule, TAKE 1 CAPSULE BY MOUTH TWICE A DAY   celecoxib (CELEBREX) 200 MG capsule, TAKE 1 CAPSULE BY MOUTH TWICE A DAY   Current Outpatient Medications (Other):    Multiple Vitamin (MULTIVITAMIN WITH MINERALS) TABS tablet, Take 1 tablet by mouth  daily.   Reviewed prior external information including notes and imaging from  primary care provider As well as notes that were available from care everywhere and other healthcare systems.  Past medical history, social, surgical and family history all reviewed in electronic medical record.  No pertanent information unless stated regarding to the chief complaint.   Review of Systems:  No headache, visual changes, nausea, vomiting, diarrhea, constipation, dizziness, abdominal pain, skin rash, fevers, chills, night sweats, weight loss, swollen lymph nodes, , joint swelling, chest pain, shortness of breath, mood changes. POSITIVE muscle aches, body aches  Objective  Blood pressure 132/88, pulse 90, height 5\' 5"  (1.651 m), weight 137 lb (62.1 kg), SpO2 100%.   General: No apparent distress alert and oriented x3 mood and affect normal, dressed appropriately.  HEENT: Pupils equal, extraocular movements intact  Respiratory: Patient's speak in full sentences and does not appear short of breath  Cardiovascular: No lower extremity edema, non tender, no erythema  Back exam shows significant loss of lordosis.  Some atrophy noted lower extremities.  Antalgic gait noted.  Knee exam shows crepitus noted.  Does have known  arthritic changes.    Impression and Recommendations:     The above documentation has been reviewed and is accurate and complete Judi Saa, DO

## 2023-04-17 ENCOUNTER — Ambulatory Visit: Payer: Medicare Other | Admitting: Family Medicine

## 2023-04-17 ENCOUNTER — Encounter: Payer: Self-pay | Admitting: Family Medicine

## 2023-04-17 VITALS — BP 132/88 | HR 90 | Ht 65.0 in | Wt 137.0 lb

## 2023-04-17 DIAGNOSIS — H53143 Visual discomfort, bilateral: Secondary | ICD-10-CM | POA: Diagnosis not present

## 2023-04-17 DIAGNOSIS — M47817 Spondylosis without myelopathy or radiculopathy, lumbosacral region: Secondary | ICD-10-CM | POA: Diagnosis not present

## 2023-04-17 DIAGNOSIS — M2550 Pain in unspecified joint: Secondary | ICD-10-CM

## 2023-04-17 DIAGNOSIS — G894 Chronic pain syndrome: Secondary | ICD-10-CM

## 2023-04-17 DIAGNOSIS — M47816 Spondylosis without myelopathy or radiculopathy, lumbar region: Secondary | ICD-10-CM | POA: Diagnosis not present

## 2023-04-17 MED ORDER — CYANOCOBALAMIN 1000 MCG/ML IJ SOLN
1000.0000 ug | Freq: Once | INTRAMUSCULAR | Status: AC
Start: 2023-04-17 — End: 2023-04-17
  Administered 2023-04-17: 1000 ug via INTRAMUSCULAR

## 2023-04-17 NOTE — Assessment & Plan Note (Signed)
 Attempted B12 injection

## 2023-04-17 NOTE — Patient Instructions (Signed)
 Good to see you B12 today Celebrex 200mg  2x a day Facet jt injections L4/L5 B and L5/S1 L I will check with Dr. Durene Cal about naltraxone Have appt in 3 months

## 2023-04-17 NOTE — Assessment & Plan Note (Addendum)
 Known severe arthritic changes in the back that is likely contributing to more of nerve root impingements.  Discussed with patient about icing regimen and home exercises, which activities to do and which ones to avoid.  Discussed increasing activity slowly.  Increase icing regimen, follow-up again in 6 to 8 weeks otherwise.  Increase the Celebrex to 200 mg.  Will do this 2 times a day.  After long discussion patient would like to try some facet injections.  Will do the severe right L4-L5.  Will see how patient responds.  Patient did not respond well to the medial branch blocks previously.  Patient declined discussing about the spinal stimulator.  Total time with patient today as well as reviewing chart 31 minutes

## 2023-04-18 ENCOUNTER — Encounter: Payer: Self-pay | Admitting: Family Medicine

## 2023-04-18 ENCOUNTER — Ambulatory Visit: Payer: Self-pay

## 2023-04-18 ENCOUNTER — Institutional Professional Consult (permissible substitution): Payer: Self-pay | Admitting: Psychology

## 2023-04-24 ENCOUNTER — Ambulatory Visit: Payer: Medicare Other | Admitting: Family Medicine

## 2023-05-01 ENCOUNTER — Encounter: Payer: Medicare Other | Admitting: Psychology

## 2023-05-08 NOTE — Discharge Instructions (Addendum)

## 2023-05-09 ENCOUNTER — Other Ambulatory Visit: Payer: Self-pay | Admitting: Family Medicine

## 2023-05-09 ENCOUNTER — Ambulatory Visit
Admission: RE | Admit: 2023-05-09 | Discharge: 2023-05-09 | Disposition: A | Discharge status: Home / Self Care | Source: Ambulatory Visit | Attending: Family Medicine | Admitting: Family Medicine

## 2023-05-09 DIAGNOSIS — M47816 Spondylosis without myelopathy or radiculopathy, lumbar region: Secondary | ICD-10-CM

## 2023-05-09 DIAGNOSIS — M2550 Pain in unspecified joint: Secondary | ICD-10-CM

## 2023-05-09 DIAGNOSIS — M47817 Spondylosis without myelopathy or radiculopathy, lumbosacral region: Secondary | ICD-10-CM

## 2023-05-09 DIAGNOSIS — G894 Chronic pain syndrome: Secondary | ICD-10-CM

## 2023-05-09 MED ORDER — METHYLPREDNISOLONE ACETATE 40 MG/ML INJ SUSP (RADIOLOG
80.0000 mg | Freq: Once | INTRAMUSCULAR | Status: AC
  Administered 2023-05-09: 80 mg via INTRA_ARTICULAR

## 2023-05-09 MED ORDER — IOPAMIDOL (ISOVUE-M 200) INJECTION 41%
1.0000 mL | Freq: Once | INTRAMUSCULAR | Status: AC
  Administered 2023-05-09: 1 mL via INTRA_ARTICULAR

## 2023-05-13 ENCOUNTER — Ambulatory Visit: Payer: Self-pay

## 2023-05-13 ENCOUNTER — Ambulatory Visit (INDEPENDENT_AMBULATORY_CARE_PROVIDER_SITE_OTHER): Admitting: Psychology

## 2023-05-13 DIAGNOSIS — R4189 Other symptoms and signs involving cognitive functions and awareness: Secondary | ICD-10-CM

## 2023-05-13 NOTE — Progress Notes (Signed)
 NEUROPSYCHOLOGICAL EVALUATION [place]. [facility]   Department of Neurology  Date of Evaluation: 05/13/2023  REASON FOR REFERRAL   Ricky [name] is a 78 year old, left-handed, White male with 16 years of formal education. He was referred for neuropsychological evaluation by Marlowe Kays, PA-C, to assess current neurocognitive functioning, document potential cognitive deficits, and assist with treatment planning. This is his first neuropsychological evaluation.  SUMMARY OF RESULTS   Premorbid cognitive abilities are estimated to be in the high average range based on word reading and sociodemographic factors. On testing today, scores were below expectations on a task of novel problem solving/cognitive flexibility and aspects of learning/memory. Regarding the latter, he demonstrated low initial encoding of a word list and shapes, which impacted his spontaneous recall of the information after a delay. However, his recognition of the information remained intact. In comparison, performance was within normal limits when encoding, recalling, and recognizing short stories. All other measures of working memory, processing speed, executive functioning, language, and visuospatial skills were intact. On self-report questionnaires, he endorsed excessive daytime sleepiness but did not report clinically-elevated levels of depression or anxiety.  DIAGNOSTIC IMPRESSION   Results of the current evaluation indicated a primary weakness in the initial encoding of a word list and shapes, which appeared to impact delayed recall but not recognition. Cognitive performance was otherwise broadly normal, and functional independence is maintained. Although the observed deficits do not meet criteria for mild cognitive impairment, serial monitoring is recommended. Etiology of deficits is likely multifactorial, with contributing factors including chronic pain, fatigue, low mood, and limited social  stimulation. Most notably, overall cognitive performance remains generally within age-related expectations, which is reassuring given his history of stroke and his absence of major cognitive concerns.  ICD-10 Codes: R41.89 Cognitive changes  RECOMMENDATIONS   A repeat neuropsychological evaluation in 12-18 months (or sooner if functional decline is noted) is recommended.  Continue managing vascular risk factors through a heart healthy diet, physician-approved physical activity, and medication adherence.  Aim to participate in activities that you find enjoyable and fulfilling, whether that be hobbies, socializing with loved ones, or being outdoors. This can improve mood, increase motivation, and offer cognitive stimulation.  Monitor mood given chronic pain and reports of depression, especially given that emotional distress can exacerbate cognitive difficulties. If mood symptoms begin to interfere with daily functioning, you are encouraged to consider available treatment options, such as medications and counseling.  Consider implementing compensatory strategies to maximize independence and maintain daily functioning. Examples include:  -Adhere to routine. Compensatory strategies work best when they are used consistently. Use a planner, calendar, or white board that has the schedule and important events for the day clearly listed to reference and cross off when tasks are complete.  -Ask for written information, especially if it is new or unfamiliar (e.g., information provided at a doctor's appointment).  -Create an organized environment. Keep items that can be easily misplaced in a sensible location and get into the habit of always returning the items to those places.  -Pay attention and reduce distractions. Make a point of focusing attention on information you want to remember. One-on-one interaction is more likely to facilitate attention and minimize distraction. Make eye contact and repeat the  information out loud after you hear it. Reduce interruptions or distractions especially when attempting to learn new information.  -Create associations. When learning something new, think about and understand the information. Explain it in your own words or try to associate it with something you already  know. Take notes to help remember important details. -Evaluate goals and plan accordingly. When confronted by many different tasks, begin by making a list that prioritizes each task and estimates the time it will take to complete. Break down complicated tasks into smaller, more manageable steps.  -Focus on one task at a time and complete each task before starting another. Avoid multitasking.   DISPOSITION   Patient will follow up with the referring provider, Ms. Wertman. He should return for repeat neuropsychological testing in 12-18 months to monitor his course and assist with diagnosis and treatment planning. He will be provided verbal feedback in approximately one week regarding the findings and impression during this visit.  The remainder of the report includes the details of the patient's background and a table of results from the current evaluation, which support the summary and recommendations described above.  BACKGROUND   History of Presenting Illness: The following information was obtained from a review of medical records and an interview with the patient. Patient established care with neurology on 01/25/2023 for a cognitive evaluation. MoCA = 26/30. No concerns with daily functioning were raised. Additional neurological workup was ordered to clarify the underlying etiology, as the patient has a history of stroke, vascular risk factors, and chronic back pain.  Cognitive Functioning: During today's appointment, the patient denied major cognitive concerns. He acknowledged that his cognition used to be "sharper and a lot quicker" and that he continues to have a "fuzzy brain," but he denied major  concerns with memory, attention, language, visuospatial skills, and executive functioning. He also noted occasional numbness in his mouth, though he is unsure of the cause. While he feels his speech is sometimes unclear because of the numbness, his wife has not noticed any changes. He reported that cognitive changes have become noticeable over the past 2-3 years and described the course as generally variable, though he has experienced more "fuzzy" days in recent times. He believes these changes are related to chronic back pain. Although he had a stroke in 2018, he does not feel there have been lasting cognitive effects. The only persistent symptom from the stroke has been reduced fine motor dexterity in the affected left hand.   Physical Functioning: Patient reported persistent back pain. He is currently receiving facet injections and undergoing acupuncture. He has previously tried physical therapy and chiropractic treatment. He described his sleep as good most of the time, though he occasionally experiences difficulties with initiation and maintenance. He wakes up a couple of times per night to use the restroom but is usually able to fall back asleep. He reported consistently low energy levels and a tendency to fatigue easily. He often takes naps during the day. Appetite is stable. No changes to sense of taste or smell were reported. He underwent cataract surgery approximately one year ago but feels his vision is fuzzy at certain distances. He had a recent eye exam a couple of weeks ago, but the results were reportedly normal. Hearing is stable. He denied balance problems, recent falls, and tremors.  Emotional Functioning: Patient described his mood as less "upbeat and positive" than usual. He further noted some depression related to his chronic pain and cognitive fuzziness. He denied suicidal ideation. He previously enjoyed many hobbies and exercised regularly at the gym, but physical limitations have reduced  these activities. He now walks laps in the pool and uses weight machines when he can. While he tries to go to dinner with friends once a month, he feels he is  becoming increasingly isolated.  Imaging: MRI of the brain (01/28/2023) documented mild generalized cerebral volume loss, mild chronic small vessel ischemic disease, and known chronic right MCA territory cortical/subcortical infarct involving portions of the mid and posterior frontal lobe/insula, parietal lobe, and posterosuperior temporal lobes with a chronic hemosiderin deposit at this site.   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 No history of CNS infection, head injury, or seizure was reported.  Current Medications: Per patient, acetaminophen, albuterol, amlodipine, aspirin, celecoxib, multiple vitamin, and rosuvastatin.  Functional Status: Patient independently performs all IADLs. He continues to drive without navigational difficulties or reported accidents or tickets. He manages most of the household finances and denied errors. He fills his own pillbox and rarely forgets doses. He is able to use household appliances without difficulty. While he is independent with ADLs, he does note that he is slower to complete such tasks due to physical limitations.  Family Neurological History: Unremarkable.  Psychiatric History: History of depression, anxiety, prior mental health treatment, suicidal ideation, hallucinations, and psychiatric hospitalizations was not reported.  Substance Use History: Patient denied current use of alcohol, nicotine, marijuana, and illicit substances.  Social and Developmental History: Patient was born in [place], [place]. History of perinatal complications and developmental delays was not reported. He is married and  has one child who lives in [place], [place]. Patient lives with his wife in their private residence.  Educational and Occupational History: No history of childhood learning disability, special education services, or grade retention was reported. 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 He obtained a bachelor's degree in zoology from [employer]. 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  BEHAVIORAL OBSERVATIONS   Patient arrived early and was unaccompanied. He ambulated independently and without gait disturbance, although he was hunched over likely due to back issues. He was alert and fully oriented. He was appropriately groomed and dressed for the setting. He exhibited weakness in his dominant left hand, which impacted his ability to write and draw. Vision (corrected) and hearing were adequate for testing purposes. Speech was of normal rate, prosody, and volume. No conversational word-finding difficulties, paraphasic errors, or dysarthria were observed. Comprehension was conversationally intact. Thought processes were linear, logical, and coherent. Thought content was organized and devoid of delusions. Insight appeared intact. Affect was even and congruent with mood. He was cooperative and gave adequate effort during testing, including on standalone and embedded measures of performance validity. Results are thought to accurately reflect his cognitive functioning at this time.  NEUROPSYCHOLOGICAL TESTING RESULTS   Tests Administered: Animal Naming Test; Beck Anxiety  Inventory (BAI); Beck Depression Inventory II (BDI-II); Brief Visuospatial Memory Test-Revised (BVMT-R) - Form 1; Controlled Oral Word Association Test (COWAT): FAS; Epworth Sleepiness Scale (ESS); Hopkins Verbal Learning Test Revised (HVLT-R) - From 1; Neuropsychological Assessment Battery (NAB) - Subtest(s): Naming Form 1; Repeatable Battery for the Assessment of Neuropsychological Status Update (RBANS Update) - Subtest(s): Line Orientation Form A; Standalone performance validity test (PVT); Test of Premorbid Functioning (  TOPF); Trail Making Test (TMT); Wechsler Adult Intelligence Scale Fourth Edition (WAIS-IV) - Subtest(s): Block Design, Matrix Reasoning, Similarities, Digit Span, Symbol Search, Coding; Wechsler Memory Scale Fourth Edition (WMS-IV) - Subtest(s): Logical Memory (LM); and [employer] Card Version (WCST-64).  Test results are provided in the table below. Whenever possible, the patient's scores were compared against age-, sex-, and education-corrected normative samples. Interpretive descriptions are based on the AACN consensus conference statement on uniform labeling (Guilmette et al., 2020).  PREMORBID FUNCTIONING RAW  RANGE  TOPF 59 StdS=117 High Average  ATTENTION & WORKING MEMORY RAW  RANGE  WAIS-IV Digit Span -- ss=10 Average  Forward -- ss=12 High Average  Backward -- ss=8 Average  Sequencing -- ss=9 Average  PROCESSING SPEED RAW  RANGE  Trails A 39''2e T=44 Average  WAIS-IV Symbol Search -- ss=11 Average  WAIS-IV Coding  -- ss=13 High Average  EXECUTIVE FUNCTION RAW  RANGE  Trails B 106''0e T=44 Average  WAIS-IV Matrix Reasoning -- ss=13 High Average  WAIS-IV Similarities -- ss=10 Average  COWAT Letter Fluency 14+14+12 T=49 Average  WCST-64 Total Errors 40 T=31 Below Average  WCST-64 Perseverative Errors 18 T=41 Low Average  WCST-64 Nonperseverative Errors 22 T=27 Exceptionally Low  WCST-64 Categories Completed 1 11-16%ile Low Average  WCSR-64 FMS 0  -- --  LANGUAGE RAW  RANGE  COWAT Letter Fluency 14+14+12 T=49 Average  Animal Naming Test 15 T=44 Average  NAB Naming Test 30/31 T=58 WNL  VISUOSPATIAL RAW  RANGE  RBANS Line Orientation -- 26-50%ile Average  WAIS-IV Block Design -- ss=9 Average  BVMT-R Copy Trial 11/12 -- WNL  VERBAL LEARNING & MEMORY RAW  RANGE  HVLT Learning Trials (3+5+4)/36 T=29 Exceptionally Low  HVLT Delayed Recall 4/12 T=35 Below Average  HVLT Recognition Hits 10 -- --  HVLT Recognition False Positives 1 -- --  HVLT Discrimination Index 9 T=43 Average  WMS-IV LM-I  (6+11+6)/53 ss=8 Average  WMS-IV LM-II  (8+4)/39 ss=9 Average  WMS-IV LM Recognition  (8+10)/23 26-50%ile Average  VISUOSPATIAL LEARNING & MEMORY RAW  RANGE  BVMT-R Total Recall (1+4+5)/36 T=32 Below Average  BVMT-R Delayed Recall 4/12 T=35 Below Average  BVMT-R Percent Retained 80 >16%ile WNL  BVMT-R Recognition Hits 6 >16%ile WNL  BVMT-R Recognition False Alarms 0 >16%ile WNL  BVMT-R Recognition Discrimination Index 6 >16%ile WNL  QUESTIONNAIRES RAW  RANGE  BDI 9 -- Minimal  BAI 7 -- Minimal  ESS 11 -- Elevated  *Note: ss = scaled score; StdS = standard score; T = t-score; C/S = corrected raw score; WNL = within normal limits; BNL= below normal limits; D/C = discontinued. Scores from skewed distributions are typically interpreted as WNL (>=16th %ile) or BNL (<16th %ile).   INFORMED CONSENT   Patient was provided with a verbal description of the nature and purpose of the neuropsychological evaluation. Also reviewed were the foreseeable risks and/or discomforts and benefits of the procedure, limits of confidentiality, and mandatory reporting requirements of this provider. Patient was given the opportunity to have their questions answered. Oral consent to participate was provided by the patient.   This report was prepared as part of a clinical evaluation and is not intended for forensic use.  SERVICE   This evaluation was conducted by Annice Pih, Psy.D. In addition to time spent directly with the patient, total professional time includes record review, integration of relevant medical history, test selection, interpretation of findings, and report preparation. A technician, Shan Levans, B.S., provided testing and scoring assistance for  105 minutes.  Psychiatric Diagnostic Evaluation Services (Professional): 16109 x 1 Neuropsychological Testing Evaluation Services (Professional): [phone] x 1 Neuropsychological Testing Evaluation Services (Professional): [id number] x 1 Neuropsychological Test Administration and Scoring Radiographer, therapeutic): [phone] x 1 Neuropsychological Test Administration and Scoring (Technician): [phone] x 2  This report was generated using voice recognition software. While this document has been carefully reviewed, transcription errors may be present. I apologize in advance for any inconvenience. Please contact me if further clarification is needed.            Annice Pih, Psy.D.             Neuropsychologist

## 2023-05-13 NOTE — Progress Notes (Signed)
   Psychometrician Note   Cognitive testing was administered to Ricky [name]. by Shan Levans, B.S. (psychometrist) under the supervision of Dr. Annice Pih, Psy.D., licensed psychologist on 05/13/2023. Mr. Kunert did not appear overtly distressed by the testing session per behavioral observation or responses across self-report questionnaires. Rest breaks were offered.    The battery of tests administered was selected by Dr. Annice Pih, Psy.D. with consideration to Mr. Levandowski's current level of functioning, the nature of his symptoms, emotional and behavioral responses during interview, level of literacy, observed level of motivation/effort, and the nature of the referral question. This battery was communicated to the psychometrist. Communication between Dr. Annice Pih, Psy.D. and the psychometrist was ongoing throughout the evaluation and Dr. Annice Pih, Psy.D. was immediately accessible at all times. Dr. Annice Pih, Psy.D. provided supervision to the psychometrist on the date of this service to the extent necessary to assure the quality of all services provided.    Ricky [name]. will return within approximately 1-2 weeks for an interactive feedback session with Dr. Robbie Lis at which time his test performances, clinical impressions, and treatment recommendations will be reviewed in detail. Mr. Norwood understands he can contact our office should he require our assistance before this time.  A total of 105 minutes of billable time were spent face-to-face with Mr. Seminara by the psychometrist. This includes both test administration and scoring time. Billing for these services is reflected in the clinical report generated by Dr. Annice Pih, Psy.D.  This note reflects time spent with the psychometrician and does not include test scores or any clinical interpretations made by Dr. Robbie Lis. The full report will follow in a separate note.

## 2023-05-16 ENCOUNTER — Encounter

## 2023-05-20 ENCOUNTER — Ambulatory Visit (INDEPENDENT_AMBULATORY_CARE_PROVIDER_SITE_OTHER): Admitting: Psychology

## 2023-05-20 DIAGNOSIS — R4189 Other symptoms and signs involving cognitive functions and awareness: Secondary | ICD-10-CM

## 2023-05-20 NOTE — Progress Notes (Signed)
   NEUROPSYCHOLOGY FEEDBACK SESSION [place]. [facility]  [place] Department of Neurology  Date of Feedback Session: 05/20/2023  REASON FOR REFERRAL   Ricky [name] is a 78 year old, left-handed, White male with 16 years of formal education. He was referred for neuropsychological evaluation by Tex Filbert, PA-C, to assess current neurocognitive functioning, document potential cognitive deficits, and assist with treatment planning. This is his first neuropsychological evaluation.  FEEDBACK   Patient completed a comprehensive neuropsychological evaluation on 05/13/2023. Please refer to that encounter for the full report and recommendations. Briefly, results indicated a primary weakness in the initial encoding of a word list and shapes, which appeared to impact delayed recall but not recognition. Cognitive performance was otherwise broadly normal, and functional independence is maintained. Although the observed deficits do not meet criteria for mild cognitive impairment, serial monitoring is recommended. Etiology of deficits is likely multifactorial, with contributing factors including chronic pain, fatigue, low mood, and limited social stimulation. Most notably, overall cognitive performance remains generally within age-related expectations, which is reassuring given his history of stroke and his absence of major cognitive concerns. .  Today, the patient was accompanied by his wife. They were provided verbal feedback regarding the findings and impression during this visit, and their questions were answered. A copy of the report was provided at the conclusion of the visit.  DISPOSITION   Patient will follow up with the referring provider, Ms. Wertman. He should return for repeat neuropsychological testing in 12-18 months to monitor his course and assist with diagnosis and treatment planning.  SERVICE   This feedback session was conducted by Janice Meeter, Psy.D. One unit of 40981 was  billed for Dr. Donavon Fudge' time spent in preparing, conducting, and documenting the current feedback session.  This report was generated using voice recognition software. While this document has been carefully reviewed, transcription errors may be present. I apologize in advance for any inconvenience. Please contact me if further clarification is needed.

## 2023-05-27 ENCOUNTER — Encounter: Payer: Self-pay | Admitting: Family Medicine

## 2023-05-30 ENCOUNTER — Ambulatory Visit (INDEPENDENT_AMBULATORY_CARE_PROVIDER_SITE_OTHER): Payer: Medicare Other | Admitting: Physician Assistant

## 2023-05-30 ENCOUNTER — Encounter: Payer: Self-pay | Admitting: Physician Assistant

## 2023-05-30 VITALS — BP 175/80 | HR 70 | Resp 20 | Ht 65.0 in | Wt 137.0 lb

## 2023-05-30 DIAGNOSIS — R4189 Other symptoms and signs involving cognitive functions and awareness: Secondary | ICD-10-CM

## 2023-05-30 MED ORDER — GABAPENTIN 300 MG PO CAPS: 300.0000 mg | ORAL_CAPSULE | Freq: Three times a day (TID) | ORAL | 3 refills | Status: DC

## 2023-05-30 NOTE — Progress Notes (Signed)
 Assessment/Plan:   Cognitive Changes, likely multifactorial  Ricky [name]. is a delightful 78 y.o. LH male with a history of hypertension, hyperlipidemia, carotid artery disease, PVD, history of stroke in February 2018 with residual mild sensory changes on left upper extremity, DDD/lumbar stenosis with chronic right low back pain followed by Ortho presenting today in follow-up for evaluation of memory loss. Patient is not on antidementia medicines at this time.  He had a neuropsych evaluation on April 2025 at which time it was felt that the etiology of his memory concerns were multifactorial with contributing factors, including chronic pain, fatigue, low mood and limited social stimulation.  He did not meet the criteria for mild cognitive impairment.  Overall his cognitive performance remained generally within age-related expectations despite his history of stroke and absence of major cognitive concerns.  Patient is able to participate on ADLs and to drive without difficulties     Recommendations:   Follow up in 1 year Repeat neuropsych evaluation in 12  months for diagnostic clarity and trajectory Replenish B12 (low normal 421) Recommend good control of cardiovascular risk factors Continue to control mood as per PCP Increase socialization Recommend psychotherapy      Subjective:   This patient is here alone. Previous records as well as any outside records available were reviewed prior to todays visit.   Patient was last seen on 01/25/23 with [facility] 26/30     Any changes in memory since last visit? "About the same".  He continues to need more time to get the right answer, remembering new information, recent conversations and names. LTM is good Repeats oneself?  Endorsed Disoriented when walking into a room?  Patient denies    Misplacing objects?  Patient denies.   Wandering behavior?   denies   Any personality changes since last visit? denies   Any worsening depression?:  Other  than feeling down because of the chronic back pain, and "fuzzy brain".   Hallucinations or paranoia?  denies   Seizures?   Denies.    Any sleep changes? Sleeps well unless he has to get up to urinate. Denies vivid dreams, REM behavior or sleepwalking   Sleep apnea?   Denies.    Any hygiene concerns?   denies   Independent of bathing and dressing?  Endorsed  Does the patient needs help with medications? Patient is in charge   Who is in charge of the finances?  Patient is in charge     Any changes in appetite?  Denies.     Patient have trouble swallowing?  Denies.   Does the patient cook?  Not as much because he cannot stand for prolonged periods of time.   Any headaches?    Denies.   Vision changes?  I have some vision issues on the L, "around the edge", "the acuity is not perfect", will see the ophthalmologist soon.  Chronic pain?  He has a history of the lumbar stenosis,  has seen orthopedics and  PT, he is on Celebrex . Has an appointment soon   Ambulates with difficulty?   Due to chronic pain, he reports limited mobility   Recent falls or head injuries?  Denies     Unilateral weakness, numbness or tingling? Denies. He has residual fine motor residual on the L hand  Any tremors?  Denies.   Any anosmia?    Denies.   Any incontinence of urine?  Denies. Only has to get up in the night to urinate    Any  bowel dysfunction?  denies      Patient lives with his wife  Does the patient drive?  Yes, he denies getting lost    Initial visit 01/25/2023 How long did patient have memory difficulties?  He reported to his PCP episodes of "can't think clearly " for about for the last year. He initially thought it was due to age.  Needs more time to get the answer.  Patient reports some difficulty remembering new information, recent conversations, names. repeats oneself?  Endorsed Disoriented when walking into a room?  Patient denies  Leaving objects in unusual places?  Denies.   Wandering behavior?  Denies.   Any personality changes, or depression, anxiety? Denies. Hallucinations or paranoia? Denies.   Seizures? Denies.    Any sleep changes?  Sleeps well "most of the time" Denies vivid dreams, REM behavior or sleepwalking   Sleep apnea? Denies.   Any hygiene concerns?  Denies.   Independent of bathing and dressing? Endorsed  Does the patient need help with medications? Patient is in charge   Who is in charge of the finances?  Patient is in charge     Any changes in appetite?   Denies.     Patient have trouble swallowing?  Denies.   Does the patient cook? "Because of my back I cannot stand too much so I don't cook as much"  Any headaches? Denies .   Chronic pain?  Endorsed, due to DDD and lumbar stenosis, sees orthopedics and physical therapy.  He is on Celebrex .  Ambulates with difficulty? Because of chronic back pain he has limited mobility.  "I can't barely take the dog out" Recent falls or head injuries? Denies. A couple of mechanical falls while walking his dog within the last 2 years.   Never had a head injury Vision changes?  Denies any new issues.  Has a history of  B cataract surgery  "I can see better and cars are brighter".  Any stroke like symptoms? No recent stroke symptoms.   Any tremors? Denies.   Any anosmia? Denies.   Any incontinence of urine? Denies.   Any bowel dysfunction? Denies.      Patient lives with his wife.  "I don't know if she noticed that I have a memory issue"  History of heavy alcohol intake? Denies.   History of heavy tobacco use? Denies.   Family history of dementia?  Denies  Does patient drive? Yes, denies getting lost.  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      Neuropsych evaluation 05/20/2023 Dr. Donavon Fudge. Briefly, results indicated a primary weakness in the initial encoding of a word list and shapes, which appeared to impact delayed recall but not recognition. Cognitive performance was otherwise broadly normal, and functional independence is maintained. Although the observed deficits do not meet criteria for mild cognitive impairment, serial monitoring is recommended. Etiology of deficits is likely multifactorial, with contributing factors including chronic pain, fatigue, low mood, and limited social stimulation. Most notably, overall cognitive performance remains generally within age-related expectations, which is reassuring given his history of stroke and his absence of major cognitive concerns.     Past Medical History:  Diagnosis Date   Arthritis    Carotid artery occlusion    Complication of anesthesia  slow to wake up after shoulder surgeries   Hyperlipidemia 03/08/2016   LDL 75 pre stroke. Goal <70 so on atorvastatin  20mg  every other day   Hypertension    Peripheral vascular disease (HCC)    Pneumonia    Stroke [facility])    still has decreased sensation in left hand and arm (as of 03/09/16)     Past Surgical History:  Procedure Laterality Date   CAROTID ENDARTERECTOMY     CATARACT EXTRACTION W/ INTRAOCULAR LENS IMPLANT Bilateral 2024   ENDARTERECTOMY Right 03/19/2016   Procedure: ENDARTERECTOMY CAROTID;  Surgeon: Richrd Char, MD;  Location: [facility] OR;  Service: Vascular;  Laterality: Right;   PATCH ANGIOPLASTY Right 03/19/2016   Procedure: PATCH ANGIOPLASTY OF RIGHT CAROTID USING HEMASHIELD PLATINUM FINESSE;  Surgeon: Richrd Char, MD;  Location: [facility] OR;  Service: Vascular;  Laterality: Right;   SHOULDER SURGERY Bilateral    left- injections intermittent now  Rotator cuff repair on both shoulders, 2 different surgeries   TONSILLECTOMY     TOTAL KNEE ARTHROPLASTY Left 12/14/2013   Procedure: LEFT TOTAL KNEE  ARTHROPLASTY;  Surgeon: Aurther Blue, MD;  Location: [facility] ORS;  Service: Orthopedics;  Laterality: Left;     PREVIOUS MEDICATIONS:   CURRENT MEDICATIONS:  Outpatient Encounter Medications as of 05/30/2023  Medication Sig   acetaminophen  (TYLENOL ) 500 MG tablet Take 500-1,000 mg by mouth every 6 (six) hours as needed (for pain.).    albuterol  (VENTOLIN  HFA) 108 (90 Base) MCG/ACT inhaler Inhale 2 puffs into the lungs every 6 (six) hours as needed for wheezing or shortness of breath.   amLODipine  (NORVASC ) 2.5 MG tablet TAKE 1 TABLET BY MOUTH EVERY DAY   aspirin  EC 81 MG tablet Take 81 mg by mouth daily. Swallow whole.   celecoxib  (CELEBREX ) 100 MG capsule TAKE 1 CAPSULE BY MOUTH TWICE A DAY   celecoxib  (CELEBREX ) 200 MG capsule TAKE 1 CAPSULE BY MOUTH TWICE A DAY   Multiple Vitamin (MULTIVITAMIN WITH MINERALS) TABS tablet Take 1 tablet by mouth daily.   rosuvastatin  (CRESTOR ) 5 MG tablet Take 1 tablet (5 mg total) by mouth daily.   No facility-administered encounter medications on file as of 05/30/2023.     Objective:     PHYSICAL EXAMINATION:    VITALS:   Vitals:   05/30/23 0914  BP: (!) 175/80  Pulse: 70  Resp: 20  SpO2: 97%  Weight: 137 lb (62.1 kg)  Height: 5\' 5"  (1.651 m)    GEN:  The patient appears stated age and is in NAD. HEENT:  Normocephalic, atraumatic.   Neurological examination:  General: NAD, well-groomed, appears stated age. Orientation: The patient is alert. Oriented to person, place and date Cranial nerves: There is good facial symmetry.The speech is fluent and clear. No aphasia or dysarthria. Fund of knowledge is appropriate. Recent memory impaired and remote memory is normal.  Attention and concentration are normal.  Able to name objects and repeat phrases.  Hearing is intact to conversational tone.    Sensation: Sensation is intact to light touch throughout Motor: Strength is at least antigravity x4. DTR's 2/4 in UE/LE      01/25/2023    9:00 AM   Montreal Cognitive Assessment   Visuospatial/ Executive (0/5) 3  Naming (0/3) 3  Attention: Read list of digits (0/2) 2  Attention: Read list of letters (0/1) 1  Attention: Serial 7 subtraction starting at 100 (0/3) 3  Language: Repeat phrase (0/2) 2  Language : Fluency (0/1) 1  Abstraction (  0/2) 1  Delayed Recall (0/5) 4  Orientation (0/6) 6  Total 26  Adjusted Score (based on education) 26       01/10/2017    8:37 AM  MMSE - Mini Mental State Exam  Not completed: --       Movement examination: Tone: There is normal tone in the UE/LE Abnormal movements:  no tremor.  No myoclonus.  No asterixis.   Coordination:  There is no decremation with RAM's. Normal finger to nose  Gait and Station: The patient has no difficulty arising out of a deep-seated chair without the use of the hands. The patient's stride length is good.  Gait is cautious and narrow, mildly flexed forward.   Thank you for allowing us  the opportunity to participate in the care of this nice patient. Please do not hesitate to contact us  for any questions or concerns.   Total time spent on today's visit was 27 minutes dedicated to this patient today, preparing to see patient, examining the patient, ordering tests and/or medications and counseling the patient, documenting clinical information in the EHR or other health record, independently interpreting results and communicating results to the patient/family, discussing treatment and goals, answering patient's questions and coordinating care.  Cc:  Almira Jaeger, MD  Tex Filbert 05/30/2023 9:50 AM

## 2023-05-30 NOTE — Patient Instructions (Addendum)
 It was a pleasure to see you today at our office.   Recommendations:   Follow up May 5 at 9:30 am  Will see you after the neurocognitive testing Replenish B12  Consider psychotherapy Increase socialization Agree with orthopedics follow up    For psychiatric meds, mood meds: Please have your primary care physician manage these medications.  If you have any severe symptoms of a stroke, or other severe issues such as confusion,severe chills or fever, etc call 911 or go to the ER as you may need to be evaluated further     For assessment of decision of mental capacity and competency:  Call Dr. Laverne Potter, geriatric psychiatrist at [phone]  Counseling regarding caregiver distress, including caregiver depression, anxiety and issues regarding community resources, adult day care programs, adult living facilities, or memory care questions:  please contact your  Primary Doctor's Social Worker   Whom to call: Memory  decline, memory medications: Call our office [phone]    [email or web address]   RECOMMENDATIONS FOR ALL PATIENTS WITH MEMORY PROBLEMS: 1. Continue to exercise (Recommend 30 minutes of walking everyday, or 3 hours every week) 2. Increase social interactions - continue going to [place] and enjoy social gatherings with friends and family 3. Eat healthy, avoid fried foods and eat more fruits and vegetables 4. Maintain adequate blood pressure, blood sugar, and blood cholesterol level. Reducing the risk of stroke and cardiovascular disease also helps promoting better memory. 5. Avoid stressful situations. Live a simple life and avoid aggravations. Organize your time and prepare for the next day in anticipation. 6. Sleep well, avoid any interruptions of sleep and avoid any distractions in the bedroom that may interfere with adequate sleep quality 7. Avoid sugar, avoid sweets as there is a strong link between excessive  sugar intake, diabetes, and cognitive impairment We discussed the Mediterranean diet, which has been shown to help patients reduce the risk of progressive memory disorders and reduces cardiovascular risk. This includes eating fish, eat fruits and green leafy vegetables, nuts like almonds and hazelnuts, walnuts, and also use olive oil. Avoid fast foods and fried foods as much as possible. Avoid sweets and sugar as sugar use has been linked to worsening of memory function.  There is always a concern of gradual progression of memory problems. If this is the case, then we may need to adjust level of care according to patient needs. Support, both to the patient and caregiver, should then be put into place.      You have been referred for a neuropsychological evaluation (i.e., evaluation of memory and thinking abilities). Please bring someone with you to this appointment if possible, as it is helpful for the doctor to hear from both you and another adult who knows you well. Please bring eyeglasses and hearing aids if you wear them.    The evaluation will take approximately 3 hours and has two parts:   The first part is a clinical interview with the neuropsychologist (Dr. Kitty Perkins or Dr. Annette Barters). During the interview, the neuropsychologist will speak with you and the individual you brought to the appointment.    The second part of the evaluation is testing with the doctor's technician Bernabe Brew or Burdette Carolin). During the testing, the technician will ask you to remember different types of material, solve problems, and answer some questionnaires. Your family member will not be present for this portion of the evaluation.   Please note: We must reserve several hours of the neuropsychologist's time and the psychometrician's time for  your evaluation appointment. As such, there is a No-Show fee of $100. If you are unable to attend any of your appointments, please contact our office as soon as possible to reschedule.       DRIVING: Regarding driving, in patients with progressive memory problems, driving will be impaired. We advise to have someone else do the driving if trouble finding directions or if minor accidents are reported. Independent driving assessment is available to determine safety of driving.   If you are interested in the driving assessment, you can contact the following:  [employer] in [place] [phone]  [facility] [phone]  [facility] [phone]  [facility] [phone] or [phone]   FALL PRECAUTIONS: Be cautious when walking. Scan the area for obstacles that may increase the risk of trips and falls. When getting up in the mornings, sit up at the edge of the bed for a few minutes before getting out of bed. Consider elevating the bed at the head end to avoid drop of blood pressure when getting up. Walk always in a well-lit room (use night lights in the walls). Avoid area rugs or power cords from appliances in the middle of the walkways. Use a walker or a cane if necessary and consider physical therapy for balance exercise. Get your eyesight checked regularly.  FINANCIAL OVERSIGHT: Supervision, especially oversight when making financial decisions or transactions is also recommended.  HOME SAFETY: Consider the safety of the kitchen when operating appliances like stoves, microwave oven, and blender. Consider having supervision and share cooking responsibilities until no longer able to participate in those. Accidents with firearms and other hazards in the house should be identified and addressed as well.   ABILITY TO BE LEFT ALONE: If patient is unable to contact 911 operator, consider using LifeLine, or when the need is there, arrange for someone to stay with patients. Smoking is a fire hazard, consider supervision or cessation. Risk of wandering should be assessed by caregiver and if detected at any point, supervision and  safe proof recommendations should be instituted.  MEDICATION SUPERVISION: Inability to self-administer medication needs to be constantly addressed. Implement a mechanism to ensure safe administration of the medications.      Mediterranean Diet A Mediterranean diet refers to food and lifestyle choices that are based on the traditions of countries located on the [employer]. This way of eating has been shown to help prevent certain conditions and improve outcomes for people who have chronic diseases, like kidney disease and heart disease. What are tips for following this plan? Lifestyle  Cook and eat meals together with your family, when possible. Drink enough fluid to keep your urine clear or pale yellow. Be physically active every day. This includes: Aerobic exercise like running or swimming. Leisure activities like gardening, walking, or housework. Get 7-8 hours of sleep each night. If recommended by your health care provider, drink red wine in moderation. This means 1 glass a day for nonpregnant women and 2 glasses a day for men. A glass of wine equals 5 oz (150 mL). Reading food labels  Check the serving size of packaged foods. For foods such as rice and pasta, the serving size refers to the amount of cooked product, not dry. Check the total fat in packaged foods. Avoid foods that have saturated fat or trans fats. Check the ingredients list for added sugars, such as corn syrup. Shopping  At the grocery store, buy most of your food from the areas near the walls  of the store. This includes: Fresh fruits and vegetables (produce). Grains, beans, nuts, and seeds. Some of these may be available in unpackaged forms or large amounts (in bulk). Fresh seafood. Poultry and eggs. Low-fat dairy products. Buy whole ingredients instead of prepackaged foods. Buy fresh fruits and vegetables in-season from local farmers markets. Buy frozen fruits and vegetables in resealable bags. If you do  not have access to quality fresh seafood, buy precooked frozen shrimp or canned fish, such as tuna, salmon, or sardines. Buy small amounts of raw or cooked vegetables, salads, or olives from the deli or salad bar at your store. Stock your pantry so you always have certain foods on hand, such as olive oil, canned tuna, canned tomatoes, rice, pasta, and beans. Cooking  Cook foods with extra-virgin olive oil instead of using butter or other vegetable oils. Have meat as a side dish, and have vegetables or grains as your main dish. This means having meat in small portions or adding small amounts of meat to foods like pasta or stew. Use beans or vegetables instead of meat in common dishes like chili or lasagna. Experiment with different cooking methods. Try roasting or broiling vegetables instead of steaming or sauteing them. Add frozen vegetables to soups, stews, pasta, or rice. Add nuts or seeds for added healthy fat at each meal. You can add these to yogurt, salads, or vegetable dishes. Marinate fish or vegetables using olive oil, lemon juice, garlic, and fresh herbs. Meal planning  Plan to eat 1 vegetarian meal one day each week. Try to work up to 2 vegetarian meals, if possible. Eat seafood 2 or more times a week. Have healthy snacks readily available, such as: Vegetable sticks with hummus. Greek yogurt. Fruit and nut trail mix. Eat balanced meals throughout the week. This includes: Fruit: 2-3 servings a day Vegetables: 4-5 servings a day Low-fat dairy: 2 servings a day Fish, poultry, or lean meat: 1 serving a day Beans and legumes: 2 or more servings a week Nuts and seeds: 1-2 servings a day Whole grains: 6-8 servings a day Extra-virgin olive oil: 3-4 servings a day Limit red meat and sweets to only a few servings a month What are my food choices? Mediterranean diet Recommended Grains: Whole-grain pasta. Brown rice. Bulgar wheat. Polenta. Couscous. Whole-wheat bread. Dwyane Glad. Vegetables: Artichokes. Beets. Broccoli. Cabbage. Carrots. Eggplant. Green beans. Chard. Kale. Spinach. Onions. Leeks. Peas. Squash. Tomatoes. Peppers. Radishes. Fruits: Apples. Apricots. Avocado. Berries. Bananas. Cherries. Dates. Figs. Grapes. Lemons. Melon. Oranges. Peaches. Plums. Pomegranate. Meats and other protein foods: Beans. Almonds. Sunflower seeds. Pine nuts. Peanuts. Cod. Salmon. Scallops. Shrimp. Tuna. Tilapia. Clams. Oysters. Eggs. Dairy: Low-fat milk. Cheese. Greek yogurt. Beverages: Water. Red wine. Herbal tea. Fats and oils: Extra virgin olive oil. Avocado oil. Grape seed oil. Sweets and desserts: [place] yogurt with honey. Baked apples. Poached pears. Trail mix. Seasoning and other foods: Basil. Cilantro. Coriander. Cumin. Mint. Parsley. Sage. Rosemary. Tarragon. Garlic. Oregano. Thyme. Pepper. Balsalmic vinegar. Tahini. Hummus. Tomato sauce. Olives. Mushrooms. Limit these Grains: Prepackaged pasta or rice dishes. Prepackaged cereal with added sugar. Vegetables: Deep fried potatoes (french fries). Fruits: Fruit canned in syrup. Meats and other protein foods: Beef. Pork. Lamb. Poultry with skin. Hot dogs. Helene Loader. Dairy: Ice cream. Sour cream. Whole milk. Beverages: Juice. Sugar-sweetened soft drinks. Beer. Liquor and spirits. Fats and oils: Butter. Canola oil. Vegetable oil. Beef fat (tallow). Lard. Sweets and desserts: Cookies. Cakes. Pies. Candy. Seasoning and other foods: Mayonnaise. Premade sauces and marinades. The items listed  may not be a complete list. Talk with your dietitian about what dietary choices are right for you. Summary The Mediterranean diet includes both food and lifestyle choices. Eat a variety of fresh fruits and vegetables, beans, nuts, seeds, and whole grains. Limit the amount of red meat and sweets that you eat. Talk with your health care provider about whether it is safe for you to drink red wine in moderation. This means 1 glass a day for  nonpregnant women and 2 glasses a day for men. A glass of wine equals 5 oz (150 mL). This information is not intended to replace advice given to you by your health care provider. Make sure you discuss any questions you have with your health care provider. Document Released: 09/15/2015 Document Revised: 10/18/2015 Document Reviewed: 09/15/2015 Elsevier Interactive Patient Education  2017 [employer].

## 2023-06-11 DIAGNOSIS — D1801 Hemangioma of skin and subcutaneous tissue: Secondary | ICD-10-CM | POA: Diagnosis not present

## 2023-06-11 DIAGNOSIS — Z85828 Personal history of other malignant neoplasm of skin: Secondary | ICD-10-CM | POA: Diagnosis not present

## 2023-06-11 DIAGNOSIS — L570 Actinic keratosis: Secondary | ICD-10-CM | POA: Diagnosis not present

## 2023-06-11 DIAGNOSIS — L821 Other seborrheic keratosis: Secondary | ICD-10-CM | POA: Diagnosis not present

## 2023-06-11 DIAGNOSIS — Z08 Encounter for follow-up examination after completed treatment for malignant neoplasm: Secondary | ICD-10-CM | POA: Diagnosis not present

## 2023-06-11 DIAGNOSIS — L814 Other melanin hyperpigmentation: Secondary | ICD-10-CM | POA: Diagnosis not present

## 2023-06-12 DIAGNOSIS — H26493 Other secondary cataract, bilateral: Secondary | ICD-10-CM | POA: Diagnosis not present

## 2023-06-13 ENCOUNTER — Ambulatory Visit (INDEPENDENT_AMBULATORY_CARE_PROVIDER_SITE_OTHER): Payer: Medicare Other | Admitting: Family Medicine

## 2023-06-13 ENCOUNTER — Encounter: Payer: Self-pay | Admitting: Family Medicine

## 2023-06-13 VITALS — BP 118/68 | HR 54 | Temp 97.9°F | Ht 65.0 in | Wt 137.6 lb

## 2023-06-13 DIAGNOSIS — I10 Essential (primary) hypertension: Secondary | ICD-10-CM

## 2023-06-13 DIAGNOSIS — E785 Hyperlipidemia, unspecified: Secondary | ICD-10-CM | POA: Diagnosis not present

## 2023-06-13 DIAGNOSIS — G894 Chronic pain syndrome: Secondary | ICD-10-CM | POA: Diagnosis not present

## 2023-06-13 DIAGNOSIS — R0683 Snoring: Secondary | ICD-10-CM

## 2023-06-13 MED ORDER — NALTREXONE HCL (PAIN) 4.5 MG PO CAPS: 4.5000 mg | ORAL_CAPSULE | Freq: Every day | ORAL | 5 refills | Status: DC

## 2023-06-13 NOTE — Progress Notes (Signed)
 Phone [phone] In person visit   Subjective:   Ricky [name]. is a 78 y.o. year old very pleasant male patient who presents for/with See problem oriented charting Chief Complaint  Patient presents with   Follow-up    Non fasting.     Past Medical History-  Patient Active Problem List   Diagnosis Date Noted   Memory impairment 01/25/2023    Priority: High   DDD (degenerative disc disease), lumbosacral 10/24/2022    Priority: High   Chronic pain syndrome 10/22/2022    Priority: High   Hemiparesis affecting dominant side as late effect of stroke (HCC) 03/08/2016    Priority: High   History of CVA (cerebrovascular accident) 02/28/2016    Priority: High   Emphysema lung (HCC) 08/25/2021    Priority: Medium    Hyperglycemia 08/25/2021    Priority: Medium    Carotid artery stenosis, symptomatic (Right) 03/19/2016    Priority: Medium    Hyperlipidemia 03/08/2016    Priority: Medium    Essential hypertension 12/20/2014    Priority: Medium    Senile purpura (HCC) 08/25/2021    Priority: Low   DDD (degenerative disc disease), lumbar 02/09/2021    Priority: Low   Acquired leg length discrepancy 02/09/2021    Priority: Low   Leukopenia 01/08/2017    Priority: Low   OA (osteoarthritis) of knee 12/14/2013    Priority: Low   Lumbar facet joint pain 11/18/2022    Priority: 1.   Spondylosis without myelopathy or radiculopathy, lumbosacral region 11/18/2022    Priority: 1.   Grade 1 Retrolisthesis of L1/L2, L2/L3, & L3/L4 10/25/2022    Priority: 1.   Lumbar facet arthropathy (Multilevel) (Bilateral) 10/25/2022    Priority: 1.   Lumbar foraminal stenosis (Bilateral: L1-2, L2-3, L3-4, L4-5) (Right - SEVERE: L4-5) 10/25/2022    Priority: 1.   Lumbar lateral recess stenosis (Bilateral: L3-4, L4-5) (Right - SEVERE: L4-5) 10/25/2022    Priority: 1.   Ligamentum flavum hypertrophy (L3-4, L4-5, L5-S1) 10/25/2022    Priority: 1.   Chronic low back pain (1ry area of Pain)  (Bilateral) w/o sciatica 10/25/2022    Priority: 1.   Grade 2 Anterolisthesis (7mm) of lumbar spine (L4/L5) 10/24/2022    Priority: 1.   Dextroscoliosis of lumbar spine 10/24/2022    Priority: 1.   Abnormal MRI, lumbar spine (05/19/2021) 10/24/2022    Priority: 1.   Spondylolisthesis of lumbar region 10/24/2022    Priority: 1.   Trigger middle finger of hand (Left) 11/12/2016    Priority: 1.    Medications- reviewed and updated Current Outpatient Medications  Medication Sig Dispense Refill   acetaminophen  (TYLENOL ) 500 MG tablet Take 500-1,000 mg by mouth every 6 (six) hours as needed (for pain.).      amLODipine  (NORVASC ) 2.5 MG tablet TAKE 1 TABLET BY MOUTH EVERY DAY 90 tablet 1   aspirin  EC 81 MG tablet Take 81 mg by mouth daily. Swallow whole.     gabapentin  (NEURONTIN ) 300 MG capsule Take 1 capsule (300 mg total) by mouth 3 (three) times daily. 90 capsule 3   Multiple Vitamin (MULTIVITAMIN WITH MINERALS) TABS tablet Take 1 tablet by mouth daily.     Naltrexone HCl, Pain, 4.5 MG CAPS Take 4.5 mg by mouth daily. 30 capsule 5   rosuvastatin  (CRESTOR ) 5 MG tablet Take 1 tablet (5 mg total) by mouth daily. 90 tablet 3   albuterol  (VENTOLIN  HFA) 108 (90 Base) MCG/ACT inhaler Inhale 2 puffs into the lungs every  6 (six) hours as needed for wheezing or shortness of breath. 1 each 0   No current facility-administered medications for this visit.     Objective:  BP 118/68   Pulse (!) 54   Temp 97.9 F (36.6 C) (Temporal)   Ht 5\' 5"  (1.651 m)   Wt 137 lb 9.6 oz (62.4 kg)   SpO2 96%   BMI 22.90 kg/m  Gen: NAD, resting comfortably CV: RRR no murmurs rubs or gallops Lungs: CTAB no crackles, wheeze, rhonchi Ext: trace edema Skin: warm, dry     Assessment and Plan   #Snoring S: ongoing fatigue issues as well- very sleepy/tired in daytime. Wife has asked about sleep apnea testing- does snore some A/P: snoring and daytimes sleepiness- refer to pulmonary for further eval -If he is  have sleep apnea also possible that would be a contributing factor to the foggy brain sensation he has experienced for some time without clear cause  # Foggy brain/being out of it sensation/vision focus issues S: Has had evaluation by neurology with reassuring MRI of the brain in 2024 -TSH and B12 normal in past  -neuropsych evaluation on April 2025 at which time it was felt that the etiology of his memory concerns were multifactorial with contributing factors, including chronic pain, fatigue, low mood and limited social stimulation  Issues with blurred vision, out of focus- saw Dr. Annabell Key lately- was told he is "eagle eyed" basically wanting focus at every level- he has good vision far and close with bifocals but has middle ground issues- was not told needs to see DrAlto Atta A/P: Ongoing issues with foggy brain with reassuring workup and neurology has seen him as well-no dementia but appears to be potentially multifactorial-we will see if sleep apnea is contributing as above -As far as the vision portion of the case had a reassuring exam with ophthalmology  #hypertension S: medication: amlodipine  2.5 mg Home readings #s: has home cuff -averages controlled but does have some significant variability.  In the past has reported tries to limit salt A/P: Blood pressure well-controlled today-some variability at home-continue current medication   # history of CVA with hemiparesis affecting dominant side (left) as a result  # carotid stenosis-  carotid stenosis of right side status post endarterectomy on the right with 1-39% on the left followed by Dr. Nolene Baumgarten #hyperlipidemia - LDL goal below 70 S: Medication:rouvastatin 5 mg ( prior Atorvastatin  20 mg every other day but stopped due to memory change), aspirin  81 mg  Lab Results  Component Value Date   CHOL 108 03/11/2023   HDL 64.80 03/11/2023   LDLCALC 35 03/11/2023   LDLDIRECT 44.0 08/03/2019   TRIG 40.0 03/11/2023   CHOLHDL 2 03/11/2023     A/P: 78 year old male with history of CVA-lipids have been extremely well-controlled-continue current medication-also at goal for carotid stenosis  # Hyperglycemia/insulin resistance/prediabetes- peak a1c 103 08/03/19.  Peak A1c 5.9 February 17, 2020 S:  Medication: None Lab Results  Component Value Date   HGBA1C 5.9 03/11/2023   HGBA1C 5.9 08/01/2022   HGBA1C 5.9 02/26/2022  A/P: A1c remains stable-continue monitor-encouraged him to remain active-does some water walking but may ache more after this and is back-I still think it is important to maintain his mobility that he continue to do this  # Degenerative disc disease/lumbar stenosis-working with Dr. Felipe Horton and has seen neurosurgery in the past with no recommendation for surgery S: Patient is taking gabapentin  up to 300 mg twice a day lately but has  not gotten to 3 times a day.  Celebrex  not very helpful so has stopped. - acupuncture feels good during but doesn't help back pain afterwards and sometimes worsens with [facility] ward-we planned to stop this -Also dry needling and multiple injections have not been helpful as well as medial branch nerve block -Spinal stimulator has been considered  -Dr. Felipe Horton has recommended considering low-dose naltrexone through primary care A/P: Ongoing issues with chronic back pain better debilitating related to degenerative disc disease and lumbar stenosis and neurosurgery has not recommended surgery.   -We opted for low-dose trial of naltrexone after reviewing the below information.  He is going to stay stable on gabapentin  300 mg twice daily-may go down later if naltrexone helpful.  Also discussed may need to send to a compounding pharmacy in that prescription itself at 4.5 mg may be difficult to find  We reviewed the following from fibromyalgia and discussing that is not his direct issue but could have similar application section on up to date "Low-dose naltrexone - Low-dose naltrexone (LDN) refers to a dose of  roughly 5 mg. It is thought to reduce chronic pain through enhancement of the endogenous opioid system and antagonism of non-opioid immune receptors (TLR-4), with consequent modulation of central nervous system (CNS) microglial cells [96].  We do not advise LDN be used as first-line therapy. However, it may remain an option for second- or third-line therapy for patients who are interested given that it is non-immunosuppressive, generally safe and well tolerated, and cost effective. Rare reported adverse effects include vivid dreams and gastrointestinal distress. LDN should not be prescribed to anyone on opioids (including tramadol ). While naltrexone itself is inexpensive, pharmacies usually dispense 50 mg tablets; thus, LDN may require a compounding pharmacy."  Recommended follow up: Return in about 2 months (around 08/13/2023) for followup or sooner if needed.Schedule b4 you leave. Future Appointments  Date Time Provider Department Center  06/25/2023  1:00 PM [facility] ANNUAL WELLNESS VISIT 1 [facility]  07/18/2023 10:30 AM Isidro Margo, DO [facility] None  08/21/2023  9:20 AM Almira Jaeger, MD [facility]  05/25/2024  8:30 AM Kdeiss, Bianca [facility] None  05/25/2024  9:30 AM [facility] NEUROPSYCH TECH2 [facility] None  06/01/2024 10:00 AM Kdeiss, Bianca [facility] None  06/09/2024  9:30 AM Alane Allen, Adriane Albe, PA-C [facility] None    Lab/Order associations:   ICD-10-CM   1. Snoring  R06.83 Ambulatory referral to Pulmonology    2. Essential hypertension  I10     3. Hyperlipidemia, unspecified hyperlipidemia type  E78.5     4. Chronic pain syndrome  G89.4       Meds ordered this encounter  Medications   Naltrexone HCl, Pain, 4.5 MG CAPS    Sig: Take 4.5 mg by mouth daily.    Dispense:  30 capsule    Refill:  5    Return precautions advised.  Clarisa Crooked, MD

## 2023-06-13 NOTE — Patient Instructions (Addendum)
 We have placed a referral for you today to pulmonary for sleep apnea evaluation - please call their # if you do not hear within a week (may be listed below or you may see mychart message within a few days with #).   [facility]s [facility] has [facility] can order low dose naltrexone 4.5 mg capsules- may also try [facility] [place] or other compounding pharmacies.   Hold steady on gabapentin  twice a day for now- if you get really good relief with naltrexone may be able to reduce dose  Recommended follow up: Return in about 2 months (around 08/13/2023) for followup or sooner if needed.Schedule b4 you leave.

## 2023-06-14 ENCOUNTER — Other Ambulatory Visit: Payer: Self-pay | Admitting: Family Medicine

## 2023-06-17 NOTE — Telephone Encounter (Signed)
 See below

## 2023-06-17 NOTE — Telephone Encounter (Signed)
 Keba please pring this instead and then we can have him pick it up and see if he can find a location that has it on hand. [facility]

## 2023-06-18 ENCOUNTER — Encounter: Payer: Self-pay | Admitting: Family Medicine

## 2023-06-19 ENCOUNTER — Other Ambulatory Visit: Payer: Self-pay

## 2023-06-19 DIAGNOSIS — G8929 Other chronic pain: Secondary | ICD-10-CM

## 2023-06-19 MED ORDER — NALTREX 4.5 MG PO CAPS: 1.0000 | ORAL_CAPSULE | Freq: Every day | ORAL | 5 refills | Status: DC

## 2023-06-19 NOTE — Telephone Encounter (Signed)
 For naltrexone  please use [facility]. All other meds use [facility], unless patient wants to switch all to [facility].

## 2023-06-25 ENCOUNTER — Ambulatory Visit (INDEPENDENT_AMBULATORY_CARE_PROVIDER_SITE_OTHER)

## 2023-06-25 VITALS — BP 138/78 | HR 67 | Temp 98.7°F | Ht 65.0 in | Wt 136.6 lb

## 2023-06-25 DIAGNOSIS — Z0000 Encounter for general adult medical examination without abnormal findings: Secondary | ICD-10-CM

## 2023-06-25 NOTE — Progress Notes (Signed)
 Subjective:   Ricky Gaunce. is a 78 y.o. who presents for a Medicare Wellness preventive visit.  As a reminder, Annual Wellness Visits don't include a physical exam, and some assessments may be limited, especially if this visit is performed virtually. We may recommend an in-person follow-up visit with your provider if needed.  Visit Complete: In person    Persons Participating in Visit: Patient.  AWV Questionnaire: Yes: Patient Medicare AWV questionnaire was completed by the patient on 06/21/23; I have confirmed that all information answered by patient is correct and no changes since this date.  Cardiac Risk Factors include: advanced age (>91men, >58 women);dyslipidemia;hypertension;male gender     Objective:     Today's Vitals   06/21/23 1420 06/25/23 1304  BP:  138/78  Pulse:  67  Temp:  98.7 F (37.1 C)  SpO2:  96%  Weight:  136 lb 9.6 oz (62 kg)  Height:  5\' 5"  (1.651 m)  PainSc: 6    PainLoc:  Back   Body mass index is 22.73 kg/m.     06/25/2023    1:10 PM 05/30/2023    9:14 AM 01/25/2023    9:55 AM 11/19/2022   10:51 AM 09/17/2022    7:42 PM 05/08/2022   10:04 AM 04/21/2021   10:24 AM  Advanced Directives  Does Patient Have a Medical Advance Directive? Yes Yes Yes Yes Yes Yes Yes  Type of Estate agent of [place];Living will Healthcare Power of [employer] Power of Attorney  Living will Healthcare Power of [place];Living will Healthcare Power of Attorney  Does patient want to make changes to medical advance directive? No - Patient declined No - Patient declined No - Patient declined  No - Patient declined No - Patient declined   Copy of Healthcare Power of Attorney in Chart? Yes - validated most recent copy scanned in chart (See row information) No - copy requested No - copy requested   Yes - validated most recent copy scanned in chart (See row information) Yes - validated most recent copy scanned in chart (See row information)     Current Medications (verified) Outpatient Encounter Medications as of 06/25/2023  Medication Sig   acetaminophen  (TYLENOL ) 500 MG tablet Take 500-1,000 mg by mouth every 6 (six) hours as needed (for pain.).    amLODipine  (NORVASC ) 2.5 MG tablet TAKE 1 TABLET BY MOUTH EVERY DAY   aspirin  EC 81 MG tablet Take 81 mg by mouth daily. Swallow whole.   gabapentin  (NEURONTIN ) 300 MG capsule Take 1 capsule (300 mg total) by mouth 3 (three) times daily.   Multiple Vitamin (MULTIVITAMIN WITH MINERALS) TABS tablet Take 1 tablet by mouth daily.   Naltrexone  HCl, Pain, (NALTREX) 4.5 MG CAPS Take 1 tablet by mouth daily.   rosuvastatin  (CRESTOR ) 5 MG tablet Take 1 tablet (5 mg total) by mouth daily.   albuterol  (VENTOLIN  HFA) 108 (90 Base) MCG/ACT inhaler Inhale 2 puffs into the lungs every 6 (six) hours as needed for wheezing or shortness of breath. (Patient not taking: Reported on 06/25/2023)   No facility-administered encounter medications on file as of 06/25/2023.    Allergies (verified) Amoxicillin   History: Past Medical History:  Diagnosis Date   Arthritis    Carotid artery occlusion    Complication of anesthesia    slow to wake up after shoulder surgeries   Hyperlipidemia 03/08/2016   LDL 75 pre stroke. Goal <70 so on atorvastatin  20mg  every other day   Hypertension  Peripheral vascular disease (HCC)    Pneumonia    Stroke [facility])    still has decreased sensation in left hand and arm (as of 03/09/16)   Past Surgical History:  Procedure Laterality Date   CAROTID ENDARTERECTOMY     CATARACT EXTRACTION W/ INTRAOCULAR LENS IMPLANT Bilateral 2024   ENDARTERECTOMY Right 03/19/2016   Procedure: ENDARTERECTOMY CAROTID;  Surgeon: Richrd Char, MD;  Location: [facility] OR;  Service: Vascular;  Laterality: Right;   PATCH ANGIOPLASTY Right 03/19/2016   Procedure: PATCH ANGIOPLASTY OF RIGHT CAROTID USING HEMASHIELD PLATINUM FINESSE;  Surgeon: Richrd Char, MD;  Location: [facility] OR;  Service:  Vascular;  Laterality: Right;   SHOULDER SURGERY Bilateral    left- injections intermittent now  Rotator cuff repair on both shoulders, 2 different surgeries   TONSILLECTOMY     TOTAL KNEE ARTHROPLASTY Left 12/14/2013   Procedure: LEFT TOTAL KNEE ARTHROPLASTY;  Surgeon: Aurther Blue, MD;  Location: [facility] ORS;  Service: Orthopedics;  Laterality: Left;   Family History  Problem Relation Age of Onset   Heart disease Father        in 82s- specifics unclear   Other Mother        passed in late 43s   Social History   Socioeconomic History   Marital status: Married    Spouse name: Not on file   Number of children: Not on file   Years of education: Not on file   Highest education level: Bachelor's degree (e.g., BA, AB, BS)  Occupational History   Occupation: Retired  Tobacco Use   Smoking status: Never    Passive exposure: Never   Smokeless tobacco: Never  Vaping Use   Vaping status: Never Used  Substance and Sexual Activity   Alcohol use: No    Comment: former drinker   Drug use: No   Sexual activity: Yes  Other Topics Concern   Not on file  Social History Narrative   Married. 1 son. 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       Hobbies: gym daily- riding exercise bike plus weights/machines, photography- used to do a lot of black and white, gardening   Left handed   Drinks coffee and caffeine   Two level home   Social Drivers of Health   Financial Resource Strain: Low Risk  (06/25/2023)   Overall Financial Resource Strain (CARDIA)    Difficulty of Paying Living Expenses: Not hard at all  Food Insecurity: No Food Insecurity (06/25/2023)   Hunger Vital Sign    Worried About Running Out of Food in the Last Year: Never true    Ran Out of Food in the Last Year: Never true  Transportation Needs: No Transportation Needs (06/25/2023)   PRAPARE - Administrator, Civil Service (Medical): No    Lack of  Transportation (Non-Medical): No  Physical Activity: Insufficiently Active (06/25/2023)   Exercise Vital Sign    Days of Exercise per Week: 2 days    Minutes of Exercise per Session: 30 min  Stress: Stress Concern Present (06/25/2023)   [employer] of Occupational Health - Occupational Stress Questionnaire    Feeling of Stress : To some extent  Social Connections: Moderately Isolated (06/25/2023)   Social Connection and Isolation Panel [NHANES]    Frequency of Communication with Friends and Family: Once a week    Frequency of Social Gatherings with Friends and Family: Once a week  Attends Religious Services: Never    Active Member of Clubs or Organizations: Yes    Attends Banker Meetings: 1 to 4 times per year    Marital Status: Married    Tobacco Counseling Counseling given: Not Answered    Clinical Intake:  Pre-visit preparation completed: Yes  Pain : 0-10 Pain Score: 6  Pain Type: Chronic pain Pain Location: Back Pain Orientation: Lower Pain Descriptors / Indicators: Aching, Sharp Pain Onset: More than a month ago Pain Frequency: Constant     BMI - recorded: 22.73 Diabetes: No  Lab Results  Component Value Date   HGBA1C 5.9 03/11/2023   HGBA1C 5.9 08/01/2022   HGBA1C 5.9 02/26/2022     How often do you need to have someone help you when you read instructions, pamphlets, or other written materials from your doctor or pharmacy?: 1 - Never  Interpreter Needed?: No  Information entered by :: Lamont Pilsner, LPN   Activities of Daily Living     06/21/2023    2:20 PM  In your present state of health, do you have any difficulty performing the following activities:  Hearing? 0  Vision? 0  Difficulty concentrating or making decisions? 0  Walking or climbing stairs? 1  Dressing or bathing? 0  Doing errands, shopping? 0  Preparing Food and eating ? Y  Comment related to back pain  Using the Toilet? N  In the past six months, have you  accidently leaked urine? N  Do you have problems with loss of bowel control? N  Managing your Medications? N  Managing your Finances? N  Housekeeping or managing your Housekeeping? Y  Comment back pain    Patient Care Team: Almira Jaeger, MD as PCP - General (Family Medicine)  Indicate any recent Medical Services you may have received from other than [facility] providers in the past year (date may be approximate).     Assessment:    This is a routine wellness examination for [place].  Hearing/Vision screen Hearing Screening - Comments:: Pt denies any hearing issues  Vision Screening - Comments:: Wears rx glasses - up to date with routine eye exams with Dr Frosty Jews     Goals Addressed             This Visit's Progress    Patient Stated       Relieve some of the pain in my back        Depression Screen     06/25/2023    1:10 PM 06/13/2023    8:46 AM 03/11/2023   10:21 AM 11/01/2022   11:08 AM 10/24/2022   10:37 AM 05/08/2022   10:03 AM 04/21/2021   10:22 AM  PHQ 2/9 Scores  PHQ - 2 Score 0 1 1 0 0 0 0  PHQ- 9 Score   2 3       Fall Risk     06/21/2023    2:20 PM 06/13/2023    8:46 AM 05/30/2023    9:18 AM 03/11/2023   10:20 AM 01/25/2023    9:55 AM  Fall Risk   Falls in the past year? 0 0 0 0 0  Number falls in past yr:  0 0 0 0  Injury with Fall?  0 0 0 0  Risk for fall due to : Impaired mobility;Impaired balance/gait No Fall Risks  No Fall Risks   Follow up Falls prevention discussed Falls evaluation completed Falls evaluation completed Falls evaluation completed Falls evaluation completed  MEDICARE RISK AT HOME:  Medicare Risk at Home Any stairs in or around the home?: (Patient-Rptd) Yes If so, are there any without handrails?: (Patient-Rptd) Yes Home free of loose throw rugs in walkways, pet beds, electrical cords, etc?: (Patient-Rptd) No Adequate lighting in your home to reduce risk of falls?: (Patient-Rptd) Yes Life alert?: (Patient-Rptd) No Use of a  cane, walker or w/c?: (Patient-Rptd) No Grab bars in the bathroom?: (Patient-Rptd) No Shower chair or bench in shower?: (Patient-Rptd) No Elevated toilet seat or a handicapped toilet?: (Patient-Rptd) No  TIMED UP AND GO:  Was the test performed?  No  Cognitive Function: 6CIT completed    01/10/2017    8:37 AM  MMSE - Mini Mental State Exam  Not completed: --      01/25/2023    9:00 AM  Montreal Cognitive Assessment   Visuospatial/ Executive (0/5) 3  Naming (0/3) 3  Attention: Read list of digits (0/2) 2  Attention: Read list of letters (0/1) 1  Attention: Serial 7 subtraction starting at 100 (0/3) 3  Language: Repeat phrase (0/2) 2  Language : Fluency (0/1) 1  Abstraction (0/2) 1  Delayed Recall (0/5) 4  Orientation (0/6) 6  Total 26  Adjusted Score (based on education) 26      06/25/2023    1:14 PM 05/08/2022   10:07 AM 04/21/2021   10:26 AM 10/01/2019   12:04 PM 09/22/2018   12:33 PM  6CIT Screen  What Year? 0 points 0 points 0 points 0 points 0 points  What month? 0 points 0 points 0 points 0 points 0 points  What time? 0 points 0 points 0 points  0 points  Count back from 20 0 points 0 points 0 points 0 points 0 points  Months in reverse 0 points 0 points 0 points 0 points 0 points  Repeat phrase 0 points 0 points 0 points 0 points 0 points  Total Score 0 points 0 points 0 points  0 points    Immunizations Immunization History  Administered Date(s) Administered   Influenza, High Dose Seasonal PF 01/10/2016, 11/12/2016, 12/10/2017, 09/28/2018, 10/18/2019, 11/11/2020, 11/10/2021, 10/28/2022   Influenza,inj,Quad PF,6+ Mos 11/30/2013, 12/20/2014   Moderna Covid-19 Fall Seasonal Vaccine 79yrs & older 10/28/2022   PFIZER Comirnaty(Gray Top)Covid-19 Tri-Sucrose Vaccine 05/08/2020, 11/10/2021   PFIZER(Purple Top)SARS-COV-2 Vaccination 02/16/2019, 03/07/2019, 11/11/2019   Pfizer Covid-19 Vaccine Bivalent Booster 49yrs & up 11/11/2020, 07/06/2021   Pneumococcal  Conjugate-13 11/30/2013   Pneumococcal Polysaccharide-23 12/20/2014   Respiratory Syncytial Virus Vaccine,Recomb Aduvanted(Arexvy) 12/01/2021   Td 04/20/2014   Zoster Recombinant(Shingrix) 09/27/2018, 12/11/2018    Screening Tests Health Maintenance  Topic Date Due   COVID-19 Vaccine (9 - Pfizer risk 2024-25 season) 06/29/2023 (Originally 04/27/2023)   INFLUENZA VACCINE  09/06/2023   DTaP/Tdap/Td (2 - Tdap) 04/19/2024   Medicare Annual Wellness (AWV)  06/24/2024   Pneumonia Vaccine 5+ Years old  Completed   Hepatitis C Screening  Completed   Zoster Vaccines- Shingrix  Completed   HPV VACCINES  Aged Out   Meningococcal B Vaccine  Aged Out   Fecal DNA (Cologuard)  Discontinued    Health Maintenance  There are no preventive care reminders to display for this patient.  Health Maintenance Items Addressed: See Nurse Notes  Additional Screening:  Vision Screening: Recommended annual ophthalmology exams for early detection of glaucoma and other disorders of the eye.  Dental Screening: Recommended annual dental exams for proper oral hygiene  Community Resource Referral / Chronic Care Management: CRR required this  visit?  No   CCM required this visit?  No   Plan:    I have personally reviewed and noted the following in the patient's chart:   Medical and social history Use of alcohol, tobacco or illicit drugs  Current medications and supplements including opioid prescriptions. Patient is not currently taking opioid prescriptions. Functional ability and status Nutritional status Physical activity Advanced directives List of other physicians Hospitalizations, surgeries, and ER visits in previous 12 months Vitals Screenings to include cognitive, depression, and falls Referrals and appointments  In addition, I have reviewed and discussed with patient certain preventive protocols, quality metrics, and best practice recommendations. A written personalized care plan for  preventive services as well as general preventive health recommendations were provided to patient.   Bruno Capri, LPN   [id number]/82/9562   After Visit Summary: (In Person-Printed) AVS printed and given to the patient  Notes: Nothing significant to report at this time.

## 2023-06-25 NOTE — Patient Instructions (Signed)
 Ricky [name] , Thank you for taking time out of your busy schedule to complete your Annual Wellness Visit with me. I enjoyed our conversation and look forward to speaking with you again next year. I, as well as your care team,  appreciate your ongoing commitment to your health goals. Please review the following plan we discussed and let me know if I can assist you in the future. Your Game plan/ To Do List    Referrals: If you haven't heard from the office you've been referred to, please reach out to them at the phone provided.    Follow up Visits: Next Medicare AWV with our clinical staff: 07/06/24   Have you seen your provider in the last 6 months (3 months if uncontrolled diabetes)? Yes Next Office Visit with your provider: 08/21/23  Clinician Recommendations:  Aim for 30 minutes of exercise or brisk walking, 6-8 glasses of water, and 5 servings of fruits and vegetables each day.       This is a list of the screening recommended for you and due dates:  Health Maintenance  Topic Date Due   Medicare Annual Wellness Visit  05/08/2023   COVID-19 Vaccine (9 - Pfizer risk 2024-25 season) 06/29/2023*   Flu Shot  09/06/2023   DTaP/Tdap/Td vaccine (2 - Tdap) 04/19/2024   Pneumonia Vaccine  Completed   Hepatitis C Screening  Completed   Zoster (Shingles) Vaccine  Completed   HPV Vaccine  Aged Out   Meningitis B Vaccine  Aged Out   Cologuard (Stool DNA test)  Discontinued  *Topic was postponed. The date shown is not the original due date.    Advanced directives: (In Chart) A copy of your advanced directives are scanned into your chart should your provider ever need it. Advance Care Planning is important because it:  [x]  Makes sure you receive the medical care that is consistent with your values, goals, and preferences  [x]  It provides guidance to your family and loved ones and reduces their decisional burden about whether or not they are making the right decisions based on your  wishes.  Follow the link provided in your after visit summary or read over the paperwork we have mailed to you to help you started getting your Advance Directives in place. If you need assistance in completing these, please reach out to us  so that we can help you!  See attachments for Preventive Care and Fall Prevention Tips.

## 2023-07-17 ENCOUNTER — Encounter: Payer: Self-pay | Admitting: Family Medicine

## 2023-07-17 NOTE — Progress Notes (Signed)
 [name] [address] [place] [address] Phone: [phone] Subjective:   Ricky [name], am serving as a scribe for Ricky [name].  I'm seeing this patient by the request  of:  Ricky Jaeger, MD  CC: Low back pain  [id number]  04/17/2023 Known severe arthritic changes in the back that is likely contributing to more of nerve root impingements.  Discussed with patient about icing regimen and home exercises, which activities to do and which ones to avoid.  Discussed increasing activity slowly.  Increase icing regimen, follow-up again in 6 to 8 weeks otherwise.  Increase the Celebrex  to 200 mg.  Will do this 2 times a day.  After long discussion patient would like to try some facet injections.  Will do the severe right L4-L5.  Will see how patient responds.  Patient did not respond well to the medial branch blocks previously.  Patient declined discussing about the spinal stimulator.  Total time with patient today as well as reviewing chart 31 minutes     Update 07/18/2023 Ricky [name]. is a 78 y.o. male coming in with complaint of lumbar spine pain. Facet injections 05/09/2023. Patient states that facet injections did not help. Feels like naltrexone  might be causing brain fog. Has appt with Dr. Arlene [name] next week.        Past Medical History:  Diagnosis Date   Arthritis    Carotid artery occlusion    Complication of anesthesia    slow to wake up after shoulder surgeries   Hyperlipidemia 03/08/2016   LDL 75 pre stroke. Goal <70 so on atorvastatin  20mg  every other day   Hypertension    Peripheral vascular disease (HCC)    Pneumonia    Stroke [facility])    still has decreased sensation in left hand and arm (as of 03/09/16)   Past Surgical History:  Procedure Laterality Date   CAROTID ENDARTERECTOMY     CATARACT EXTRACTION W/ INTRAOCULAR LENS IMPLANT Bilateral 2024   ENDARTERECTOMY Right 03/19/2016   Procedure: ENDARTERECTOMY CAROTID;   Surgeon: Ricky Char, MD;  Location: [facility] OR;  Service: Vascular;  Laterality: Right;   PATCH ANGIOPLASTY Right 03/19/2016   Procedure: PATCH ANGIOPLASTY OF RIGHT CAROTID USING HEMASHIELD PLATINUM FINESSE;  Surgeon: Ricky Char, MD;  Location: [facility] OR;  Service: Vascular;  Laterality: Right;   SHOULDER SURGERY Bilateral    left- injections intermittent now  Rotator cuff repair on both shoulders, 2 different surgeries   TONSILLECTOMY     TOTAL KNEE ARTHROPLASTY Left 12/14/2013   Procedure: LEFT TOTAL KNEE ARTHROPLASTY;  Surgeon: Ricky Blue, MD;  Location: [facility] ORS;  Service: Orthopedics;  Laterality: Left;   Social History   Socioeconomic History   Marital status: Married    Spouse name: Not on file   Number of children: Not on file   Years of education: Not on file   Highest education level: Bachelor's degree (e.g., BA, AB, BS)  Occupational History   Occupation: Retired  Tobacco Use   Smoking status: Never    Passive exposure: Never   Smokeless tobacco: Never  Vaping Use   Vaping status: Never Used  Substance and Sexual Activity   Alcohol use: No    Comment: former drinker   Drug use: No   Sexual activity: Yes  Other Topics Concern   Not on file  Social History Narrative   Married. 1 son. 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       Hobbies: gym daily- riding exercise bike plus weights/machines, photography- used to do a lot of black and white, gardening   Left handed   Drinks coffee and caffeine   Two level home   Social Drivers of Health   Financial Resource Strain: Low Risk  (06/25/2023)   Overall Financial Resource Strain (CARDIA)    Difficulty of Paying Living Expenses: Not hard at all  Food Insecurity: No Food Insecurity (06/25/2023)   Hunger Vital Sign    Worried About Running Out of Food in the Last Year: Never true    Ran Out of Food in the Last Year: Never true  Transportation Needs: No  Transportation Needs (06/25/2023)   PRAPARE - Administrator, Civil Service (Medical): No    Lack of Transportation (Non-Medical): No  Physical Activity: Insufficiently Active (06/25/2023)   Exercise Vital Sign    Days of Exercise per Week: 2 days    Minutes of Exercise per Session: 30 min  Stress: Stress Concern Present (06/25/2023)   [employer] of Occupational Health - Occupational Stress Questionnaire    Feeling of Stress : To some extent  Social Connections: Moderately Isolated (06/25/2023)   Social Connection and Isolation Panel    Frequency of Communication with Friends and Family: Once a week    Frequency of Social Gatherings with Friends and Family: Once a week    Attends Religious Services: Never    Database administrator or Organizations: Yes    Attends Engineer, structural: 1 to 4 times per year    Marital Status: Married   Allergies  Allergen Reactions   Amoxicillin Rash    Has patient had a PCN reaction causing immediate rash, facial/tongue/throat swelling, SOB or lightheadedness with hypotension:unsure Has patient had a PCN reaction causing severe rash involving mucus membranes or skin necrosis:unsure Has patient had a PCN reaction that required hospitalization No Has patient had a PCN reaction occurring within the last 10 years: No If all of the above answers are NO, then may proceed with Cephalosporin use.    Family History  Problem Relation Age of Onset   Heart disease Father        in 59s- specifics unclear   Other Mother        passed in late 28s     Current Outpatient Medications (Cardiovascular):    amLODipine  (NORVASC ) 2.5 MG tablet, TAKE 1 TABLET BY MOUTH EVERY DAY   rosuvastatin  (CRESTOR ) 5 MG tablet, Take 1 tablet (5 mg total) by mouth daily.  Current Outpatient Medications (Respiratory):    albuterol  (VENTOLIN  HFA) 108 (90 Base) MCG/ACT inhaler, Inhale 2 puffs into the lungs every 6 (six) hours as needed for wheezing or  shortness of breath. (Patient not taking: Reported on 06/25/2023)  Current Outpatient Medications (Analgesics):    acetaminophen  (TYLENOL ) 500 MG tablet, Take 500-1,000 mg by mouth every 6 (six) hours as needed (for pain.).    aspirin  EC 81 MG tablet, Take 81 mg by mouth daily. Swallow whole.   Naltrexone  HCl, Pain, (NALTREX) 4.5 MG CAPS, Take 1 tablet by mouth daily.   Current Outpatient Medications (Other):    gabapentin  (NEURONTIN ) 300 MG capsule, Take 1 capsule (300 mg total) by mouth 3 (three) times daily.   Multiple Vitamin (MULTIVITAMIN WITH MINERALS) TABS tablet, Take 1 tablet by mouth daily.   Reviewed prior external information including notes and imaging from  primary care  provider As well as notes that were available from care everywhere and other healthcare systems.  Past medical history, social, surgical and family history all reviewed in electronic medical record.  No pertanent information unless stated regarding to the chief complaint.   Review of Systems:  No headache, visual changes, nausea, vomiting, diarrhea, constipation, dizziness, abdominal pain, skin rash, fevers, chills, night sweats, weight loss, swollen lymph nodes, body aches, joint swelling, chest pain, shortness of breath, mood changes. POSITIVE muscle aches  Objective  Blood pressure (!) 122/92, pulse 66, height 5' 5 (1.651 m), weight 136 lb (61.7 kg), SpO2 98%.   General: No apparent distress alert and oriented x3 mood and affect normal, dressed appropriately.  HEENT: Pupils equal, extraocular movements intact  Respiratory: Patient's speak in full sentences and does not appear short of breath  Cardiovascular: No lower extremity edema, non tender, no erythema  Patient does have antalgic gait noted.  Significant scoliosis of the back noted.  Able to sit comfortably but difficulty getting up and does have a wide-based gait noted.    Impression and Recommendations:    The above documentation has been  reviewed and is accurate and complete Rhanda Lemire M Daysie Helf, DO

## 2023-07-18 ENCOUNTER — Encounter: Payer: Self-pay | Admitting: Family Medicine

## 2023-07-18 ENCOUNTER — Ambulatory Visit: Admitting: Family Medicine

## 2023-07-18 VITALS — BP 122/92 | HR 66 | Ht 65.0 in | Wt 136.0 lb

## 2023-07-18 DIAGNOSIS — M4310 Spondylolisthesis, site unspecified: Secondary | ICD-10-CM | POA: Diagnosis not present

## 2023-07-18 DIAGNOSIS — M4316 Spondylolisthesis, lumbar region: Secondary | ICD-10-CM

## 2023-07-18 DIAGNOSIS — R413 Other amnesia: Secondary | ICD-10-CM

## 2023-07-18 DIAGNOSIS — M48061 Spinal stenosis, lumbar region without neurogenic claudication: Secondary | ICD-10-CM

## 2023-07-18 DIAGNOSIS — M47817 Spondylosis without myelopathy or radiculopathy, lumbosacral region: Secondary | ICD-10-CM

## 2023-07-18 NOTE — Patient Instructions (Signed)
 Good to see you Read about Journavax and I will talk to Dr. Arlene Ben Add 1 gabapentin  at night Continue naltraxone See me in 3 months

## 2023-07-18 NOTE — Assessment & Plan Note (Addendum)
 Significant arthritic changes noted at back with some radicular symptoms.  Patient was having more facet pain as well but not responding to the injections.  Very difficult to treat.  We discussed with patient's about the gabapentin  and how this could be beneficial but patient had discontinued because he thought it was giving him some of the difficulty with concentration and memory.  Patient has not made any significant improvement at this moment.  We discussed different medications and patient is on the naltrexone .  Will be following up with primary care for further workup for some of his memory problems as well.  Discussed the possibility of new medication for a peripheral sodium channel blocker.  Patient wants to consider this but would like to ask primary care first.  Follow-up again in 6 to 8 weeks

## 2023-07-18 NOTE — Assessment & Plan Note (Addendum)
 Will be following up with primary care provider.  Very difficult to treat with this individual today with the slow progression of the symptoms to be having.  MRI of the brain did show some white matter changes but nothing specific.  B12 deficiency has been treated.  Doing oral supplementation but not doing injections at the moment.  Total time discussing this as well as back pain and alternative treatment options 32 minutes

## 2023-07-18 NOTE — Telephone Encounter (Signed)
LVM to come in sooner

## 2023-07-23 ENCOUNTER — Encounter: Payer: Self-pay | Admitting: Family Medicine

## 2023-07-23 ENCOUNTER — Ambulatory Visit (INDEPENDENT_AMBULATORY_CARE_PROVIDER_SITE_OTHER): Admitting: Family Medicine

## 2023-07-23 VITALS — BP 130/70 | HR 70 | Temp 98.0°F | Ht 65.0 in | Wt 135.4 lb

## 2023-07-23 DIAGNOSIS — I10 Essential (primary) hypertension: Secondary | ICD-10-CM | POA: Diagnosis not present

## 2023-07-23 DIAGNOSIS — R413 Other amnesia: Secondary | ICD-10-CM | POA: Diagnosis not present

## 2023-07-23 DIAGNOSIS — M48061 Spinal stenosis, lumbar region without neurogenic claudication: Secondary | ICD-10-CM | POA: Diagnosis not present

## 2023-07-23 DIAGNOSIS — M5137 Other intervertebral disc degeneration, lumbosacral region with discogenic back pain only: Secondary | ICD-10-CM

## 2023-07-23 NOTE — Progress Notes (Signed)
 Phone [phone] In person visit   Subjective:   Ricky Catoe. is a 78 y.o. year old very pleasant male patient who presents for/with See problem oriented charting Chief Complaint  Patient presents with   2  month f/u   Back Pain    Pt c/o continued back issues.   Past Medical History-  Patient Active Problem List   Diagnosis Date Noted   Memory impairment 01/25/2023    Priority: High   DDD (degenerative disc disease), lumbosacral 10/24/2022    Priority: High   Chronic pain syndrome 10/22/2022    Priority: High   Hemiparesis affecting dominant side as late effect of stroke (HCC) 03/08/2016    Priority: High   History of CVA (cerebrovascular accident) 02/28/2016    Priority: High   Emphysema lung (HCC) 08/25/2021    Priority: Medium    Hyperglycemia 08/25/2021    Priority: Medium    Carotid artery stenosis, symptomatic (Right) 03/19/2016    Priority: Medium    Hyperlipidemia 03/08/2016    Priority: Medium    Essential hypertension 12/20/2014    Priority: Medium    Senile purpura (HCC) 08/25/2021    Priority: Low   DDD (degenerative disc disease), lumbar 02/09/2021    Priority: Low   Acquired leg length discrepancy 02/09/2021    Priority: Low   Leukopenia 01/08/2017    Priority: Low   OA (osteoarthritis) of knee 12/14/2013    Priority: Low   Lumbar facet joint pain 11/18/2022    Priority: 1.   Spondylosis without myelopathy or radiculopathy, lumbosacral region 11/18/2022    Priority: 1.   Grade 1 Retrolisthesis of L1/L2, L2/L3, & L3/L4 10/25/2022    Priority: 1.   Lumbar facet arthropathy (Multilevel) (Bilateral) 10/25/2022    Priority: 1.   Lumbar foraminal stenosis (Bilateral: L1-2, L2-3, L3-4, L4-5) (Right - SEVERE: L4-5) 10/25/2022    Priority: 1.   Lumbar lateral recess stenosis (Bilateral: L3-4, L4-5) (Right - SEVERE: L4-5) 10/25/2022    Priority: 1.   Ligamentum flavum hypertrophy (L3-4, L4-5, L5-S1) 10/25/2022    Priority: 1.   Chronic low  back pain (1ry area of Pain) (Bilateral) w/o sciatica 10/25/2022    Priority: 1.   Grade 2 Anterolisthesis (7mm) of lumbar spine (L4/L5) 10/24/2022    Priority: 1.   Dextroscoliosis of lumbar spine 10/24/2022    Priority: 1.   Abnormal MRI, lumbar spine (05/19/2021) 10/24/2022    Priority: 1.   Spondylolisthesis of lumbar region 10/24/2022    Priority: 1.   Trigger middle finger of hand (Left) 11/12/2016    Priority: 1.    Medications- reviewed and updated Current Outpatient Medications  Medication Sig Dispense Refill   acetaminophen  (TYLENOL ) 500 MG tablet Take 500-1,000 mg by mouth every 6 (six) hours as needed (for pain.).      albuterol  (VENTOLIN  HFA) 108 (90 Base) MCG/ACT inhaler Inhale 2 puffs into the lungs every 6 (six) hours as needed for wheezing or shortness of breath. 1 each 0   amLODipine  (NORVASC ) 2.5 MG tablet TAKE 1 TABLET BY MOUTH EVERY DAY 90 tablet 1   aspirin  EC 81 MG tablet Take 81 mg by mouth daily. Swallow whole.     gabapentin  (NEURONTIN ) 300 MG capsule Take 1 capsule (300 mg total) by mouth 3 (three) times daily. 90 capsule 3   Multiple Vitamin (MULTIVITAMIN WITH MINERALS) TABS tablet Take 1 tablet by mouth daily.     Naltrexone  HCl, Pain, (NALTREX) 4.5 MG CAPS Take 1 tablet by mouth  daily. 30 capsule 5   rosuvastatin  (CRESTOR ) 5 MG tablet Take 1 tablet (5 mg total) by mouth daily. 90 tablet 3   No current facility-administered medications for this visit.     Objective:  BP 130/70   Pulse 70   Temp 98 F (36.7 C)   Ht 5' 5 (1.651 m)   Wt 135 lb 6.4 oz (61.4 kg)   SpO2 97%   BMI 22.53 kg/m  Gen: NAD, resting comfortably CV: RRR no murmurs rubs or gallops Lungs: CTAB no crackles, wheeze, rhonchi Ext: no edema Skin: warm, dry     Assessment and Plan    # Degenerative disc disease/lumbar stenosis S: gabapentin  at night along with naltrexone  but if takes gabapentin  in day time can cause some confusion. Is at least sleeping. Able to get through  some activities but other activies increase pain significantly- such as later in day after going to gym but misses being in gym -multiple prior failures such as Celebrex . Never really tried Cymbalta .  -sometimes feels like ibuprofen or tylenol  help best but risks to cardiac healtha neurological deficit(s) kidney health noted. Acupuncture didn't help nor dry needling nor chiropractic nor physical therapy.  - working with Dr. Felipe Horton regularly -naltrexone  not covered- $70 for 30 day supply. He's not sure if its helpful -has felt like back pain issues have contributed to overall health and mobility deterioration and wonders if medications or back contributes to some of mental fog issues which are ongoing. Has a lot of fatigue still -reports saw pain management in the past last September - has tried facet injections and steroid injections from Dr. Felipe Horton -neurosurgery has not recommended surgery A/P: degenerative disk disease with lumbar stenosis with ongoing significant pain -I have recommended he return to pain management that he has seen in [place] as we have made little effect with multiple attempts - he asks about journavx but that would likely only be a short term option due to cost and current approval - continue gabapentin  and low dose naltrexone  just as night as feels more confused with daytime gabapentin - that also makes me cautious about retrial of Cymbalta  though he does not believe he gave this significant trial in past -we will retrial physical therapy at his request today  #hypertension S: medication: amlodipine  7.5 mg--> 5--> 2.5 mg daily (has tried to come off but had to restart) Home readings #s: has home cuff - not checking as much lately BP Readings from Last 3 Encounters:  07/23/23 130/70  07/18/23 (!) 122/92  06/25/23 138/78  A/P: well controlled continue current medications    # Foggy brain/being out of it sensation- seen April 2025 and plan for 1 year follow up  with  neurology   Future Appointments  Date Time Provider Department Center  08/13/2023  9:00 AM Rosa College D, MD [facility] None  10/15/2023 10:00 AM Isidro Margo, DO [facility] None  05/25/2024  8:30 AM Kdeiss, Bianca [facility] None  05/25/2024  9:30 AM [facility] NEUROPSYCH TECH2 [facility] None  06/01/2024 10:00 AM Kdeiss, Bianca [facility] None  06/09/2024  9:30 AM Rosi Converse, PA-C [facility] None  07/06/2024 10:00 AM [facility] ANNUAL WELLNESS VISIT 1 [facility]    Lab/Order associations:   ICD-10-CM   1. Degeneration of intervertebral disc of lumbosacral region with discogenic back pain  M51.370 Ambulatory referral to Physical Therapy    2. Spinal stenosis of lumbar region without neurogenic claudication  M48.061 Ambulatory referral to Physical Therapy    3. Essential  hypertension  I10     4. Memory impairment  R41.3       No orders of the defined types were placed in this encounter.   Return precautions advised.  Ricky Crooked, MD

## 2023-07-23 NOTE — Patient Instructions (Addendum)
 degenerative disk disease with lumbar stenosis with ongoing significant pain -I have recommended he return to pain management that he has seen in [place] as we have made little effect with multiple attempts - he asks about journavx but that would likely only be a short term option due to cost and current approval - continue gabapentin  and low dose naltrexone  just as night as feels more confused with daytime gabapentin - that also makes me cautious about retrial of Cymbalta  though he does not believe he gave this significant trial in past  Schedule physical therapy on way out of office today  Recommended follow up: Return in about 3 months (around 10/23/2023) for followup or sooner if needed.Schedule b4 you leave.

## 2023-07-24 ENCOUNTER — Encounter: Payer: Self-pay | Admitting: Family Medicine

## 2023-08-08 ENCOUNTER — Encounter: Payer: Self-pay | Admitting: Family Medicine

## 2023-08-09 NOTE — Progress Notes (Unsigned)
 08/13/23- 77yoM never smoker for sleep evaluation courtesy of Dr Garnette Lukes with concern of snoring and brain fog. Lumbar DDD., HTN, ASCVD, Hx CVA,  Epworth score-8 Body weight today-135 lbs Discussed the use of AI scribe software for clinical note transcription with the patient, who gave verbal consent to proceed.  History of Present Illness   Ricky Terrio. is a 77 year old male who presents with concerns about snoring and potential sleep apnea.  He experiences occasional snoring, particularly when sleeping on his back due to back problems. His wife has observed episodes of apnea during sleep, raising concerns about potential sleep apnea.   Sleep is impacted by chronic back pain which restricts his sleep position usually to supine.  He generally feels rested during the day unless he wakes up multiple times at night to urinate. He takes naltrexone  and gabapentin  in the evening, which may aid his sleep. He limits caffeine intake to mornings and occasionally after dinner.  He has a history of a small spot of emphysema discovered years ago after symptoms similar to pneumonia. He was prescribed an inhaler initially but has not needed it recently. He has no current lung or breathing problems and has never smoked.  Family history includes a brother with unspecified sleep issues. He had his tonsils removed many years ago but no other surgeries related to his nose or throat.     Prior to Admission medications   Medication Sig Start Date End Date Taking? Authorizing Provider  acetaminophen  (TYLENOL ) 500 MG tablet Take 500-1,000 mg by mouth every 6 (six) hours as needed (for pain.).    Yes [provider]  albuterol  (VENTOLIN  HFA) 108 (90 Base) MCG/ACT inhaler Inhale 2 puffs into the lungs every 6 (six) hours as needed for wheezing or shortness of breath. 10/28/20  Yes Lukes Garnette KIDD, MD  amLODipine  (NORVASC ) 2.5 MG tablet TAKE 1 TABLET BY MOUTH EVERY DAY 02/15/23  Yes Lukes Garnette KIDD, MD  aspirin  EC 81 MG tablet Take 81 mg by mouth daily. Swallow whole.   Yes [provider]  gabapentin  (NEURONTIN ) 300 MG capsule Take 1 capsule (300 mg total) by mouth 3 (three) times daily. 05/30/23  Yes Lukes Garnette KIDD, MD  Multiple Vitamin (MULTIVITAMIN WITH MINERALS) TABS tablet Take 1 tablet by mouth daily.   Yes [provider]  Naltrexone  HCl, Pain, (NALTREX) 4.5 MG CAPS Take 1 tablet by mouth daily. 06/19/23  Yes Lukes Garnette KIDD, MD  rosuvastatin  (CRESTOR ) 5 MG tablet Take 1 tablet (5 mg total) by mouth daily. 12/27/22  Yes Lukes Garnette KIDD, MD    Past Medical History:  Diagnosis Date   Arthritis    Carotid artery occlusion    Complication of anesthesia    slow to wake up after shoulder surgeries   Hyperlipidemia 03/08/2016   LDL 75 pre stroke. Goal <70 so on atorvastatin  20mg  every other day   Hypertension    Peripheral vascular disease (HCC)    Pneumonia    Stroke [facility])    still has decreased sensation in left hand and arm (as of 03/09/16)   Family History  Problem Relation Age of Onset   Heart disease Father        in 62s- specifics unclear   Other Mother        passed in late 7s   Social History   Socioeconomic History   Marital status: Married    Spouse name: Not on file   Number of children: Not  on file   Years of education: Not on file   Highest education level: Bachelor's degree (e.g., BA, AB, BS)  Occupational History   Occupation: Retired  Tobacco Use   Smoking status: Never    Passive exposure: Never   Smokeless tobacco: Never  Vaping Use   Vaping status: Never Used  Substance and Sexual Activity   Alcohol use: No    Comment: former drinker   Drug use: No   Sexual activity: Yes  Other Topics Concern   Not on file  Social History Narrative   Married. 1 son. 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       Hobbies: gym daily- riding exercise bike plus  weights/machines, photography- used to do a lot of black and white, gardening   Left handed   Drinks coffee and caffeine   Two level home   Social Drivers of Health   Financial Resource Strain: Low Risk  (07/20/2023)   Overall Financial Resource Strain (CARDIA)    Difficulty of Paying Living Expenses: Not hard at all  Food Insecurity: No Food Insecurity (07/20/2023)   Hunger Vital Sign    Worried About Running Out of Food in the Last Year: Never true    Ran Out of Food in the Last Year: Never true  Transportation Needs: No Transportation Needs (07/20/2023)   PRAPARE - Administrator, Civil Service (Medical): No    Lack of Transportation (Non-Medical): No  Physical Activity: Insufficiently Active (07/20/2023)   Exercise Vital Sign    Days of Exercise per Week: 2 days    Minutes of Exercise per Session: 40 min  Stress: No Stress Concern Present (07/20/2023)   [employer] of Occupational Health - Occupational Stress Questionnaire    Feeling of Stress: Only a little  Recent Concern: Stress - Stress Concern Present (06/25/2023)   [employer] of Occupational Health - Occupational Stress Questionnaire    Feeling of Stress : To some extent  Social Connections: Unknown (07/20/2023)   Social Connection and Isolation Panel    Frequency of Communication with Friends and Family: Twice a week    Frequency of Social Gatherings with Friends and Family: Once a week    Attends Religious Services: Never    Diplomatic Services operational officer: Not on file    Attends Banker Meetings: Not on file    Marital Status: Married  Recent Concern: Social Connections - Moderately Isolated (06/25/2023)   Social Connection and Isolation Panel    Frequency of Communication with Friends and Family: Once a week    Frequency of Social Gatherings with Friends and Family: Once a week    Attends Religious Services: Never    Database administrator or Organizations: Yes    Attends  Banker Meetings: 1 to 4 times per year    Marital Status: Married  Catering manager Violence: Not At Risk (06/25/2023)   Humiliation, Afraid, Rape, and Kick questionnaire    Fear of Current or Ex-Partner: No    Emotionally Abused: No    Physically Abused: No    Sexually Abused: No   Assessment and Plan:    Sleep apnea suspected Possible sleep apnea due to snoring and apnea episodes. Nocturnal symptoms may worsen in supine position. Discussed untreated sleep apnea risks: cardiovascular events, hypertension, cognitive decline. Explained sleep apnea mechanics and impact on sleep quality and alertness. - Order home sleep  study. - Discussed home sleep study benefits and details. - Explained treatment options: CPAP and dental appliances, emphasizing CPAP's safety and effectiveness.   Degenerative Disc Disease Chronic back pain     ASCVD -hx CVA  ROS-see HPI   + = positive Constitutional:    weight loss, night sweats, fevers, chills, fatigue, lassitude. HEENT:    headaches, difficulty swallowing, tooth/dental problems, sore throat,       sneezing, itching, ear ache, nasal congestion, post nasal drip, snoring CV:    chest pain, orthopnea, PND, swelling in lower extremities, anasarca,                                   dizziness, palpitations Resp:   shortness of breath with exertion or at rest.                productive cough,   non-productive cough, coughing up of blood.              change in color of mucus.  wheezing.   Skin:    rash or lesions. GI:  No-   heartburn, indigestion, abdominal pain, nausea, vomiting, diarrhea,                 change in bowel habits, loss of appetite GU: dysuria, change in color of urine, no urgency or frequency.   flank pain. MS:   joint pain, stiffness, decreased range of motion, back pain. Neuro-     nothing unusual Psych:  change in mood or affect.  depression or anxiety.   memory loss.  OBJ- Physical Exam General- Alert, Oriented,  Affect-appropriate, Distress- none acute, +not obese Skin- rash-none, lesions- none, excoriation- none Lymphadenopathy- none Head- atraumatic            Eyes- Gross vision intact, PERRLA, conjunctivae and secretions clear            Ears- Hearing, canals-normal            Nose- Clear, no-Septal dev, mucus, polyps, erosion, perforation             Throat- Mallampati II , mucosa clear , drainage- none, tonsils- absent, +teeth Neck- flexible , trachea midline, no stridor , thyroid  nl, carotid no bruit Chest - symmetrical excursion , unlabored           Heart/CV- RRR , no murmur , no gallop  , no rub, nl s1 s2                           - JVD- none , edema- none, stasis changes- none, varices- none           Lung- clear to P&A, wheeze- none, cough- none , dullness-none, rub- none           Chest wall-  Abd-  Br/ Gen/ Rectal- Not done, not indicated Extrem- cyanosis- none, clubbing, none, atrophy- none, strength- nl Neuro- grossly intact to observation

## 2023-08-13 ENCOUNTER — Ambulatory Visit (INDEPENDENT_AMBULATORY_CARE_PROVIDER_SITE_OTHER): Admitting: Internal Medicine

## 2023-08-13 ENCOUNTER — Encounter: Payer: Self-pay | Admitting: Internal Medicine

## 2023-08-13 VITALS — BP 156/85 | HR 54 | Temp 97.9°F | Resp 18 | Ht 65.0 in | Wt 135.8 lb

## 2023-08-13 DIAGNOSIS — R0683 Snoring: Secondary | ICD-10-CM | POA: Diagnosis not present

## 2023-08-13 NOTE — Patient Instructions (Signed)
 Order- home sleep test  dx snoring  Please call us  about 2 weeks after your sleep study for results and recommendation

## 2023-08-20 ENCOUNTER — Ambulatory Visit: Admitting: Internal Medicine

## 2023-08-21 ENCOUNTER — Ambulatory Visit: Admitting: Family Medicine

## 2023-08-29 ENCOUNTER — Other Ambulatory Visit: Payer: Self-pay

## 2023-08-29 ENCOUNTER — Ambulatory Visit: Admitting: Physical Therapy

## 2023-08-29 ENCOUNTER — Encounter: Payer: Self-pay | Admitting: Physical Therapy

## 2023-08-29 DIAGNOSIS — R293 Abnormal posture: Secondary | ICD-10-CM

## 2023-08-29 DIAGNOSIS — M5459 Other low back pain: Secondary | ICD-10-CM | POA: Diagnosis not present

## 2023-08-29 DIAGNOSIS — M6281 Muscle weakness (generalized): Secondary | ICD-10-CM

## 2023-08-29 NOTE — Therapy (Signed)
 OUTPATIENT PHYSICAL THERAPY THORACOLUMBAR EVALUATION   Patient Name: Ricky [name]. MRN: [id number] DOB:Jul 03, 1945, 78 y.o., male Today's Date: 08/29/2023  END OF SESSION:  PT End of Session - 08/29/23 1031     Visit Number 1    Number of Visits 16    Date for PT Re-Evaluation 10/24/23    Authorization Type Medicare    Progress Note Due on Visit 10    PT Start Time 1031   late arrival   PT Stop Time 1107    PT Time Calculation (min) 36 min    Activity Tolerance Patient tolerated treatment well          Past Medical History:  Diagnosis Date   Arthritis    Carotid artery occlusion    Complication of anesthesia    slow to wake up after shoulder surgeries   Hyperlipidemia 03/08/2016   LDL 75 pre stroke. Goal <70 so on atorvastatin  20mg  every other day   Hypertension    Peripheral vascular disease (HCC)    Pneumonia    Stroke [facility])    still has decreased sensation in left hand and arm (as of 03/09/16)   Past Surgical History:  Procedure Laterality Date   CAROTID ENDARTERECTOMY     CATARACT EXTRACTION W/ INTRAOCULAR LENS IMPLANT Bilateral 2024   ENDARTERECTOMY Right 03/19/2016   Procedure: ENDARTERECTOMY CAROTID;  Surgeon: Carlin FORBES Haddock, MD;  Location: [facility] OR;  Service: Vascular;  Laterality: Right;   PATCH ANGIOPLASTY Right 03/19/2016   Procedure: PATCH ANGIOPLASTY OF RIGHT CAROTID USING HEMASHIELD PLATINUM FINESSE;  Surgeon: Carlin FORBES Haddock, MD;  Location: [facility] OR;  Service: Vascular;  Laterality: Right;   SHOULDER SURGERY Bilateral    left- injections intermittent now  Rotator cuff repair on both shoulders, 2 different surgeries   TONSILLECTOMY     TOTAL KNEE ARTHROPLASTY Left 12/14/2013   Procedure: LEFT TOTAL KNEE ARTHROPLASTY;  Surgeon: Dempsey Melodi GAILS, MD;  Location: [facility] ORS;  Service: Orthopedics;  Laterality: Left;   Patient Active Problem List   Diagnosis Date Noted   Memory impairment 01/25/2023   Lumbar facet joint pain 11/18/2022   Spondylosis without  myelopathy or radiculopathy, lumbosacral region 11/18/2022   Grade 1 Retrolisthesis of L1/L2, L2/L3, & L3/L4 10/25/2022   Lumbar facet arthropathy (Multilevel) (Bilateral) 10/25/2022   Lumbar foraminal stenosis (Bilateral: L1-2, L2-3, L3-4, L4-5) (Right - SEVERE: L4-5) 10/25/2022   Lumbar lateral recess stenosis (Bilateral: L3-4, L4-5) (Right - SEVERE: L4-5) 10/25/2022   Ligamentum flavum hypertrophy (L3-4, L4-5, L5-S1) 10/25/2022   Chronic low back pain (1ry area of Pain) (Bilateral) w/o sciatica 10/25/2022   Grade 2 Anterolisthesis (7mm) of lumbar spine (L4/L5) 10/24/2022   DDD (degenerative disc disease), lumbosacral 10/24/2022   Dextroscoliosis of lumbar spine 10/24/2022   Abnormal MRI, lumbar spine (05/19/2021) 10/24/2022   Spondylolisthesis of lumbar region 10/24/2022   Chronic pain syndrome 10/22/2022   Emphysema lung (HCC) 08/25/2021   Hyperglycemia 08/25/2021   Senile purpura (HCC) 08/25/2021   DDD (degenerative disc disease), lumbar 02/09/2021   Acquired leg length discrepancy 02/09/2021   Leukopenia 01/08/2017   Trigger middle finger of hand (Left) 11/12/2016   Carotid artery stenosis, symptomatic (Right) 03/19/2016   Hemiparesis affecting dominant side as late effect of stroke (HCC) 03/08/2016   Hyperlipidemia 03/08/2016   History of CVA (cerebrovascular accident) 02/28/2016   Essential hypertension 12/20/2014   OA (osteoarthritis) of knee 12/14/2013    PCP: Katrinka Garnette KIDD, MD  REFERRING PROVIDER: Katrinka Garnette KIDD, MD  REFERRING DIAG: [phone] (  ICD-10-CM) - Degeneration of intervertebral disc of lumbosacral region with discogenic back pain M48.061 (ICD-10-CM) - Spinal stenosis of lumbar region without neurogenic claudication  Rationale for Evaluation and Treatment: Rehabilitation  THERAPY DIAG:  Other low back pain  Muscle weakness (generalized)  Abnormal posture  ONSET DATE: 2-3 years ago  SUBJECTIVE:                                                                                                                                                                                            SUBJECTIVE STATEMENT: Pt reports having a fuzzy brain and bad back. Very early on I tried physical therapy at the [facility]. It didn't go well. I switched at some point while I tried another shot. The next time I tried [facility] and I really liked them. I stopped because I was going to try chiropractic care. I never went back. Since then I've had more shots and tried acupuncture. Pt states he thinks the PT was helping some. Last did it ~1 year ago. Pt reports increased pain in the afternoon. Pt states back pain is worsening. Pt reports he's lost a lot of muscle mass. Has been having a harder time getting up/down stairs. When I'm walking around I have to bend over a little bit  PERTINENT HISTORY:  L TKA; no history of back surgery but multiple injections  PAIN:  Are you having pain? Yes: NPRS scale: 2/10 currently, at worst 8/10, on average 5/10 Pain location: lower back (R worse than L) -- no radiation down legs Pain description: tired not sharp pain but can be Aggravating factors: Yard work, barely walk dog at night (a couple minutes) sometimes, cooking with bending over, prolonged standing Relieving factors: Hot tub, sitting rest, supported on cart  PRECAUTIONS: None  RED FLAGS: None   WEIGHT BEARING RESTRICTIONS: No  FALLS:  Has patient fallen in last 6 months? No   LIVING ENVIRONMENT: Lives with: lives with their spouse, dog and cat Lives in: House/apartment Stairs: 1-2 steps to enter the back, 5-6 from the front but doesn't use the front; a dozen to get upstairs Has following equipment at home: None  OCCUPATION: Retired - likes to go to [employer], chores/cooking/groceries  PLOF: Independent  PATIENT GOALS: I'd like to be able to walk a little further without pain  NEXT MD VISIT: PRN  OBJECTIVE:  Note: Objective measures were completed at  Evaluation unless otherwise noted.  DIAGNOSTIC FINDINGS:  Lumbar x-ray 10/24/22 IMPRESSION: Grade 2 anterolisthesis of L4 on L5. Degree of anterolisthesis at L4-5 is unchanged on flexion and extension views.  PATIENT SURVEYS:  PSFS: THE PATIENT SPECIFIC  FUNCTIONAL SCALE  Place score of 0-10 (0 = unable to perform activity and 10 = able to perform activity at the same level as before injury or problem)  Activity Date: 08/29/23    Walking 3    2.  Yard work 3    3.  Cooking 2 to 4 depending on what it is         Total Score 3      Total Score = Sum of activity scores/number of activities  Minimally Detectable Change: 3 points (for single activity); 2 points (for average score)  [facility] (nd). The Patient Specific Functional Scale . Retrieved from [email or web address]   COGNITION: Overall cognitive status: reports brain fog     SENSATION: WFL  MUSCLE LENGTH: Hamstrings: WNL R&L Thomas test: WNL R&L  POSTURE: rounded shoulders, forward head, decreased lumbar lordosis, increased thoracic kyphosis, anterior pelvic tilt, and flexed trunk   PALPATION: Hypertonic bilat lumbar paraspinals  LUMBAR ROM:   AROM eval  Flexion 80% with knees slightly bent  Extension 25% pain  Right lateral flexion 75%  Left lateral flexion 75%  Right rotation 80% discomfort  Left rotation 80% discomfort   (Blank rows = not tested)  LOWER EXTREMITY ROM:   WNL   LOWER EXTREMITY MMT:    MMT Right eval Left eval  Hip flexion 4 4  Hip extension 3- 3-  Hip abduction 3- 3-  Hip adduction    Hip internal rotation 4 4-  Hip external rotation 3+ 4-  Knee flexion 5 5  Knee extension 5 5  Ankle dorsiflexion    Ankle plantarflexion    Ankle inversion    Ankle eversion     (Blank rows = not tested)  LUMBAR SPECIAL TESTS:  Straight leg raise test: Negative, Single leg stance test: Positive, FABER test: Negative, and Thomas  test: Negative  FUNCTIONAL TESTS:  5 times sit to stand: 12.12 sec without UE 30 seconds chair stand test 16  GAIT: Distance walked: Into clinic Assistive device utilized: None Level of assistance: Complete Independence Comments: Lumbar and thoracic spine both flexed, keeps arms back behind his back  TREATMENT DATE: 08/29/23 See HEP below                                                                                                                                 PATIENT EDUCATION:  Education details: Exam findings, POC, initial HEP Person educated: Patient Education method: Explanation, Demonstration, and Handouts Education comprehension: verbalized understanding, returned demonstration, and needs further education  HOME EXERCISE PROGRAM: Access Code: [email or web address] URL: [email or web address] Date: 08/29/2023 Prepared by: Tatelyn Vanhecke April Earnie Starring  Exercises - Seated Scapular Retraction  - 1 x daily - 7 x weekly - 2 sets - 10 reps - Prone Press Up On Elbows  - 1 x daily - 7 x weekly - 2 sets - 10 reps  ASSESSMENT:  CLINICAL IMPRESSION: Patient is a 78  y.o. CHRISTELLA who was seen today for physical therapy evaluation and treatment for low back pain. PMH is significant for L TKA and L4/5 anterolisthesis. Pt has tried multiple modalities such as acupuncture, multiple shots, and PT in the past with greatest concern for his standing posture and pain with walking and muscle loss. Assessment demos postural abnormalities (flexed in lumbar and thoracic spine in standing), gross posterior chain/extensor weakness with decreased lumbar extensor endurance affecting home and community activities. Pt will benefit from PT to address these issues to maximize his level of function  OBJECTIVE IMPAIRMENTS: Abnormal gait, decreased activity tolerance, decreased balance, decreased endurance, decreased mobility, difficulty walking, decreased ROM, decreased strength, increased fascial restrictions,  increased muscle spasms, improper body mechanics, postural dysfunction, and pain.   ACTIVITY LIMITATIONS: standing, stairs, transfers, bed mobility, and locomotion level  PARTICIPATION LIMITATIONS: meal prep, cleaning, laundry, shopping, community activity, and yard work  PERSONAL FACTORS: Age, Fitness, Past/current experiences, and Time since onset of injury/illness/exacerbation are also affecting patient's functional outcome.   REHAB POTENTIAL: Good  CLINICAL DECISION MAKING: Evolving/moderate complexity  EVALUATION COMPLEXITY: Moderate   GOALS: Goals reviewed with patient? Yes  SHORT TERM GOALS: Target date: 09/26/2023   Pt will be ind with initial HEP Baseline: Goal status: INITIAL  2.  Pt will report improved overall pain by >/=25% Baseline:  Goal status: INITIAL   LONG TERM GOALS: Target date: 10/24/2023   Pt will be ind with management and progression of HEP Baseline:  Goal status: INITIAL  2.  Pt will be ind with maintaining trunk extension for at least 45 min of activity Baseline: Trunk flexed in lumbar and thoracic spine Goal status: INITIAL  3.  Pt will demo at least 4/5 strength in thoracolumbar paraspinals and hip ext/abductors for improved standing posture Baseline: see MMT above Goal status: INITIAL  4.  Pt will report improved pain by >/=50% Baseline: 2/10 currently (and at its best), at worst 8/10, on average 5/10 Goal status: INITIAL  5.  Pt will have improved PSFS average score to >/=5 Baseline: 2 Goal status: INITIAL   PLAN:  PT FREQUENCY: 1-2x/week  PT DURATION: 8 weeks  PLANNED INTERVENTIONS: 97164- PT Re-evaluation, 97750- Physical Performance Testing, 97110-Therapeutic exercises, 97530- Therapeutic activity, 97112- Neuromuscular re-education, 97535- Self Care, 02859- Manual therapy, [phone]- Gait training, [phone]- Aquatic Therapy, [phone]- Electrical stimulation (unattended), 97016- Vasopneumatic device, [id number]- Ionotophoresis 4mg /ml  Dexamethasone , 79439 (1-2 muscles), 20561 (3+ muscles)- Dry Needling, Patient/Family education, Balance training, Stair training, Taping, Joint mobilization, Spinal mobilization, Cryotherapy, and Moist heat.  PLAN FOR NEXT SESSION: Assess response to HEP. Work on posterior Public librarian and endurance.    Sherian Valenza April Ma L Axel Frisk, PT, DPT 08/29/2023, 1:41 PM

## 2023-09-03 ENCOUNTER — Encounter: Payer: Self-pay | Admitting: Family Medicine

## 2023-09-03 DIAGNOSIS — M6281 Muscle weakness (generalized): Secondary | ICD-10-CM

## 2023-09-16 NOTE — Telephone Encounter (Signed)
 Please see patient message.

## 2023-09-19 ENCOUNTER — Other Ambulatory Visit: Payer: Self-pay | Admitting: Family Medicine

## 2023-09-19 DIAGNOSIS — M5137 Other intervertebral disc degeneration, lumbosacral region with discogenic back pain only: Secondary | ICD-10-CM

## 2023-09-20 ENCOUNTER — Encounter

## 2023-09-20 DIAGNOSIS — R0683 Snoring: Secondary | ICD-10-CM

## 2023-09-20 DIAGNOSIS — G4733 Obstructive sleep apnea (adult) (pediatric): Secondary | ICD-10-CM | POA: Diagnosis not present

## 2023-10-01 ENCOUNTER — Encounter: Admitting: Physical Therapy

## 2023-10-04 DIAGNOSIS — G4733 Obstructive sleep apnea (adult) (pediatric): Secondary | ICD-10-CM | POA: Diagnosis not present

## 2023-10-04 DIAGNOSIS — M5137 Other intervertebral disc degeneration, lumbosacral region with discogenic back pain only: Secondary | ICD-10-CM | POA: Diagnosis not present

## 2023-10-08 ENCOUNTER — Encounter: Admitting: Physical Therapy

## 2023-10-09 ENCOUNTER — Encounter: Payer: Self-pay | Admitting: Internal Medicine

## 2023-10-10 DIAGNOSIS — M5137 Other intervertebral disc degeneration, lumbosacral region with discogenic back pain only: Secondary | ICD-10-CM | POA: Diagnosis not present

## 2023-10-14 NOTE — Progress Notes (Unsigned)
 [name] [address] [place] [address] Phone: [phone] Subjective:   [name], am serving as a scribe for Dr. Arthea Claudene.  I'm seeing this patient by the request  of:  Katrinka Garnette KIDD, MD  CC: Low back pain  YEP:Dlagzrupcz  07/18/2023 Will be following up with primary care provider.  Very difficult to treat with this individual today with the slow progression of the symptoms to be having.  MRI of the brain did show some white matter changes but nothing specific.  B12 deficiency has been treated.  Doing oral supplementation but not doing injections at the moment.  Total time discussing this as well as back pain and alternative treatment options 32 minutes     Significant arthritic changes noted at back with some radicular symptoms.  Patient was having more facet pain as well but not responding to the injections.  Very difficult to treat.  We discussed with patient's about the gabapentin  and how this could be beneficial but patient had discontinued because he thought it was giving him some of the difficulty with concentration and memory.  Patient has not made any significant improvement at this moment.  We discussed different medications and patient is on the naltrexone .  Will be following up with primary care for further workup for some of his memory problems as well.  Discussed the possibility of new medication for a peripheral sodium channel blocker.  Patient wants to consider this but would like to ask primary care first.  Follow-up again in 6 to 8 weeks     Update 10/15/2023 Ricky [name]. is a 78 y.o. male coming in with complaint of lumbar spine pain.  Known significant arthritic changes.  Patient states no large changes. No matter what the activity the back eventual hurts. Has started PT again.     Past Medical History:  Diagnosis Date   Arthritis    Carotid artery occlusion    Complication of anesthesia    slow to wake up  after shoulder surgeries   Hyperlipidemia 03/08/2016   LDL 75 pre stroke. Goal <70 so on atorvastatin  20mg  every other day   Hypertension    Peripheral vascular disease (HCC)    Pneumonia    Stroke [facility])    still has decreased sensation in left hand and arm (as of 03/09/16)   Past Surgical History:  Procedure Laterality Date   CAROTID ENDARTERECTOMY     CATARACT EXTRACTION W/ INTRAOCULAR LENS IMPLANT Bilateral 2024   ENDARTERECTOMY Right 03/19/2016   Procedure: ENDARTERECTOMY CAROTID;  Surgeon: Carlin FORBES Haddock, MD;  Location: [facility] OR;  Service: Vascular;  Laterality: Right;   PATCH ANGIOPLASTY Right 03/19/2016   Procedure: PATCH ANGIOPLASTY OF RIGHT CAROTID USING HEMASHIELD PLATINUM FINESSE;  Surgeon: Carlin FORBES Haddock, MD;  Location: [facility] OR;  Service: Vascular;  Laterality: Right;   SHOULDER SURGERY Bilateral    left- injections intermittent now  Rotator cuff repair on both shoulders, 2 different surgeries   TONSILLECTOMY     TOTAL KNEE ARTHROPLASTY Left 12/14/2013   Procedure: LEFT TOTAL KNEE ARTHROPLASTY;  Surgeon: Dempsey Melodi GAILS, MD;  Location: [facility] ORS;  Service: Orthopedics;  Laterality: Left;   Social History   Socioeconomic History   Marital status: Married    Spouse name: Not on file   Number of children: Not on file   Years of education: Not on file   Highest education level: Bachelor's degree (e.g., BA, AB, BS)  Occupational History  Occupation: Retired  Tobacco Use   Smoking status: Never    Passive exposure: Never   Smokeless tobacco: Never  Vaping Use   Vaping status: Never Used  Substance and Sexual Activity   Alcohol use: No    Comment: former drinker   Drug use: No   Sexual activity: Yes  Other Topics Concern   Not on file  Social History Narrative   Married. 1 son. 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       Hobbies: gym daily- riding exercise bike plus weights/machines, photography- used  to do a lot of black and white, gardening   Left handed   Drinks coffee and caffeine   Two level home   Social Drivers of Health   Financial Resource Strain: Low Risk  (07/20/2023)   Overall Financial Resource Strain (CARDIA)    Difficulty of Paying Living Expenses: Not hard at all  Food Insecurity: No Food Insecurity (07/20/2023)   Hunger Vital Sign    Worried About Running Out of Food in the Last Year: Never true    Ran Out of Food in the Last Year: Never true  Transportation Needs: No Transportation Needs (07/20/2023)   PRAPARE - Administrator, Civil Service (Medical): No    Lack of Transportation (Non-Medical): No  Physical Activity: Insufficiently Active (07/20/2023)   Exercise Vital Sign    Days of Exercise per Week: 2 days    Minutes of Exercise per Session: 40 min  Stress: No Stress Concern Present (07/20/2023)   [employer] of Occupational Health - Occupational Stress Questionnaire    Feeling of Stress: Only a little  Recent Concern: Stress - Stress Concern Present (06/25/2023)   [employer] of Occupational Health - Occupational Stress Questionnaire    Feeling of Stress : To some extent  Social Connections: Unknown (07/20/2023)   Social Connection and Isolation Panel    Frequency of Communication with Friends and Family: Twice a week    Frequency of Social Gatherings with Friends and Family: Once a week    Attends Religious Services: Never    Diplomatic Services operational officer: Not on file    Attends Banker Meetings: Not on file    Marital Status: Married  Recent Concern: Social Connections - Moderately Isolated (06/25/2023)   Social Connection and Isolation Panel    Frequency of Communication with Friends and Family: Once a week    Frequency of Social Gatherings with Friends and Family: Once a week    Attends Religious Services: Never    Database administrator or Organizations: Yes    Attends Engineer, structural: 1 to 4  times per year    Marital Status: Married   Allergies  Allergen Reactions   Amoxicillin Rash    Has patient had a PCN reaction causing immediate rash, facial/tongue/throat swelling, SOB or lightheadedness with hypotension:unsure Has patient had a PCN reaction causing severe rash involving mucus membranes or skin necrosis:unsure Has patient had a PCN reaction that required hospitalization No Has patient had a PCN reaction occurring within the last 10 years: No If all of the above answers are NO, then may proceed with Cephalosporin use.    Family History  Problem Relation Age of Onset   Heart disease Father        in 71s- specifics unclear   Other Mother        passed in  late 42s     Current Outpatient Medications (Cardiovascular):    amLODipine  (NORVASC ) 2.5 MG tablet, TAKE 1 TABLET BY MOUTH EVERY DAY   rosuvastatin  (CRESTOR ) 5 MG tablet, Take 1 tablet (5 mg total) by mouth daily.  Current Outpatient Medications (Respiratory):    albuterol  (VENTOLIN  HFA) 108 (90 Base) MCG/ACT inhaler, Inhale 2 puffs into the lungs every 6 (six) hours as needed for wheezing or shortness of breath.  Current Outpatient Medications (Analgesics):    celecoxib  (CELEBREX ) 100 MG capsule, Take 1 capsule (100 mg total) by mouth 2 (two) times daily.   acetaminophen  (TYLENOL ) 500 MG tablet, Take 500-1,000 mg by mouth every 6 (six) hours as needed (for pain.).    aspirin  EC 81 MG tablet, Take 81 mg by mouth daily. Swallow whole.   Naltrexone  HCl, Pain, (NALTREX) 4.5 MG CAPS, Take 1 tablet by mouth daily.   Current Outpatient Medications (Other):    gabapentin  (NEURONTIN ) 300 MG capsule, Take 1 capsule (300 mg total) by mouth 3 (three) times daily.   Multiple Vitamin (MULTIVITAMIN WITH MINERALS) TABS tablet, Take 1 tablet by mouth daily.   Reviewed prior external information including notes and imaging from  primary care provider As well as notes that were available from care everywhere and other  healthcare systems.  Past medical history, social, surgical and family history all reviewed in electronic medical record.  No pertanent information unless stated regarding to the chief complaint.   Review of Systems:  No headache, visual changes, nausea, vomiting, diarrhea, constipation, dizziness, abdominal pain, skin rash, fevers, chills, night sweats, weight loss, swollen lymph nodes, body aches, joint swelling, chest pain, shortness of breath, mood changes. POSITIVE muscle aches  Objective  Blood pressure 124/88, pulse 64, height 5' 5 (1.651 m), weight 136 lb (61.7 kg), SpO2 96%.   General: No apparent distress alert and oriented x3 mood and affect normal, dressed appropriately.  HEENT: Pupils equal, extraocular movements intact  Respiratory: Patient's speak in full sentences and does not appear short of breath  Cardiovascular: No lower extremity edema, non tender, no erythema  Low back exam shows severe scoliosis noted.  In sitting.  Does have an antalgic gait noted.  With some difficulty going from seated to standing.  Neurovascularly intact distally.     Impression and Recommendations:     The above documentation has been reviewed and is accurate and complete Ricky [name] M Ricky Geier, DO

## 2023-10-15 ENCOUNTER — Encounter: Payer: Self-pay | Admitting: Family Medicine

## 2023-10-15 ENCOUNTER — Ambulatory Visit (INDEPENDENT_AMBULATORY_CARE_PROVIDER_SITE_OTHER): Admitting: Family Medicine

## 2023-10-15 ENCOUNTER — Other Ambulatory Visit: Payer: Self-pay | Admitting: Family Medicine

## 2023-10-15 VITALS — BP 124/88 | HR 64 | Ht 65.0 in | Wt 136.0 lb

## 2023-10-15 DIAGNOSIS — M47817 Spondylosis without myelopathy or radiculopathy, lumbosacral region: Secondary | ICD-10-CM

## 2023-10-15 MED ORDER — CELECOXIB 100 MG PO CAPS: 100.0000 mg | ORAL_CAPSULE | Freq: Two times a day (BID) | ORAL | 0 refills | Status: DC

## 2023-10-15 MED ORDER — COVID-19 MRNA VACC (MODERNA) 50 MCG/0.5ML IM SUSP: 0.5000 mL | Freq: Once | INTRAMUSCULAR | 0 refills | Status: AC

## 2023-10-15 NOTE — Patient Instructions (Addendum)
 Celebrex  100mg  bid 5 days in a row 2 days off Try to read about spinal stimulators See you again in 3 months

## 2023-10-15 NOTE — Assessment & Plan Note (Signed)
 Unfortunately patient does have pain noted of this area and multiple other joints.  We have discussed with patient in great length about icing regimen and home exercises, patient continues to try to be active but has been somewhat noncompliant with different treatment options.  We discussed with patient that I do feel formal physical therapy will be the most beneficial for this individual.  Discussed with patient we will also different medications.  Patient wants to avoid too many of them.  We also discussed the possibility of a spinal stimulator.  Discuss his Celebrex  which has been prescribed for him previously but will do 100 mg twice a day as needed and warned of potential side effects.  Patient wants to try it and see how he responds.  Told him to do it in 5-day burst.  Follow-up with me again in 3 months.  Total time reviewing patient's chart and discussing with patient 32 minutes

## 2023-10-18 DIAGNOSIS — M5137 Other intervertebral disc degeneration, lumbosacral region with discogenic back pain only: Secondary | ICD-10-CM | POA: Diagnosis not present

## 2023-10-22 DIAGNOSIS — M5137 Other intervertebral disc degeneration, lumbosacral region with discogenic back pain only: Secondary | ICD-10-CM | POA: Diagnosis not present

## 2023-10-24 ENCOUNTER — Ambulatory Visit (INDEPENDENT_AMBULATORY_CARE_PROVIDER_SITE_OTHER): Admitting: Family Medicine

## 2023-10-24 ENCOUNTER — Encounter: Payer: Self-pay | Admitting: Family Medicine

## 2023-10-24 VITALS — BP 132/72 | HR 56 | Temp 97.3°F | Ht 65.0 in | Wt 136.6 lb

## 2023-10-24 DIAGNOSIS — M48061 Spinal stenosis, lumbar region without neurogenic claudication: Secondary | ICD-10-CM | POA: Diagnosis not present

## 2023-10-24 DIAGNOSIS — E785 Hyperlipidemia, unspecified: Secondary | ICD-10-CM

## 2023-10-24 DIAGNOSIS — I10 Essential (primary) hypertension: Secondary | ICD-10-CM | POA: Diagnosis not present

## 2023-10-24 DIAGNOSIS — M5137 Other intervertebral disc degeneration, lumbosacral region with discogenic back pain only: Secondary | ICD-10-CM | POA: Diagnosis not present

## 2023-10-24 NOTE — Assessment & Plan Note (Signed)
 Blood pressure is well controlled on amlodipine  2.5 mg daily. Blood pressure readings show variability with occasional highs up to 159/80 mmHg and lows down to 99/72 mmHg but generally trend down into normal range with 3 checks at home. Current management is effective in maintaining overall control without causing symptomatic hypotension-she denies any symptoms when running on the lower end. - Continue amlodipine  2.5 mg daily. - Monitor blood pressure regularly.

## 2023-10-24 NOTE — Assessment & Plan Note (Signed)
 Degenerative disc disease and lumbar spinal stenosis with chronic pain Chronic pain persists despite current regimen including low-dose naltrexone , gabapentin , and Celebrex . Recent consultation with Dr. Darlyn Sharps of sports medicine recommended formal physical therapy and consideration of a spinal cord stimulator. Celebrex  is used in short bursts to minimize side effects such as kidney damage and increased blood pressure-fortunately his blood pressure is tolerating this. Gabapentin  is currently taken at night at 300 mg, and there is concern about increasing the dose due to potential side effects such as imbalance and confusion. Naltrexone 's benefit is unclear, and a trial off is considered. - Continue gabapentin  at night at 300 mg. - Trial off naltrexone  and monitor for worsening pain. - Continue Celebrex  in short bursts as prescribed.  But he is aware of risks to the kidneys and heart. - Engage in formal physical therapy. - Consider spinal cord stimulator if pain persists. - Avoid ibuprofen while on Celebrex -in general I would not recommend this for him. -If he comes off of naltrexone  and does not report any worsening could also trial another dose of gabapentin  in the daytime but make sure to have family close to check on his stability and cognition-has reported some issues with foggy headedness over time so I want to be cautious

## 2023-10-24 NOTE — Patient Instructions (Addendum)
 Ok to trial off naltrexone  but restart if pain worsens. Continue gabapentin - could do another dose in the morning but would only make one change at a time and be careful about balance and cognition on higher dose- would be good to have family close on first day you try it  Recommended follow up: Return in about 3 months (around 01/23/2024) for followup or sooner if needed.Schedule b4 you leave.

## 2023-10-24 NOTE — Progress Notes (Signed)
 Phone [phone] In person visit   Subjective:   Ricky [name]. is a 78 y.o. year old very pleasant male patient who presents for/with See problem oriented charting Chief Complaint  Patient presents with   Hypertension   Spinal Stenosis   Discussed the use of AI scribe software for clinical note transcription with the patient, who gave verbal consent to proceed.  History of Present Illness   Ricky [name]. is a 77 year old male with hypertension, hyperlipidemia, and chronic pain who presents for follow-up.  His blood pressure is generally well controlled on amlodipine  2.5 mg daily, though he notes variability with occasional highs up to 159/80 mmHg and lows down to 99/72 mmHg. He manages these fluctuations by rechecking his blood pressure, which often results in lower readings.  His hyperlipidemia is managed with rosuvastatin  5 mg daily, achieving an LDL level well below his target of 70 mg/dL, with a recent reading of 35 mg/dL. He also takes aspirin  due to a history of stroke with left-sided hemiparesis and carotid stenosis, status post endarterectomy.  He experiences chronic low back pain and pain in multiple joints, attributed to degenerative disc disease and lumbar stenosis. His current pain management regimen includes low-dose naltrexone , gabapentin  300 mg at bedtime, and Celebrex  100 mg twice daily. Despite these medications, he continues to experience significant pain. He has tried various treatments, including physical therapy (recently restarted) and discussions about a spinal cord stimulator.  He has undergone a home sleep test, and he reports that the results 'don't look good,' although he has not yet discussed these with his pulmonologist. Sleeping on his side reduces snoring and seems to improve his sleep quality, as reported by his wife.  He feels generally discouraged and depressed due to his chronic pain and reduced mobility. He experiences fatigue and sleepiness,  particularly after eating, and notes a significant loss of muscle mass and strength, making daily activities challenging. He attends physical therapy once a week and engages in pool exercises twice a week to maintain mobility with 1 other gym workout per week.     Past Medical History-  Patient Active Problem List   Diagnosis Date Noted   Memory impairment 01/25/2023    Priority: High   DDD (degenerative disc disease), lumbosacral 10/24/2022    Priority: High   Chronic pain syndrome 10/22/2022    Priority: High   Hemiparesis affecting dominant side as late effect of stroke (HCC) 03/08/2016    Priority: High   History of CVA (cerebrovascular accident) 02/28/2016    Priority: High   Emphysema lung (HCC) 08/25/2021    Priority: Medium    Hyperglycemia 08/25/2021    Priority: Medium    Carotid artery stenosis, symptomatic (Right) 03/19/2016    Priority: Medium    Hyperlipidemia 03/08/2016    Priority: Medium    Essential hypertension 12/20/2014    Priority: Medium    Senile purpura (HCC) 08/25/2021    Priority: Low   DDD (degenerative disc disease), lumbar 02/09/2021    Priority: Low   Acquired leg length discrepancy 02/09/2021    Priority: Low   Leukopenia 01/08/2017    Priority: Low   OA (osteoarthritis) of knee 12/14/2013    Priority: Low   Lumbar facet joint pain 11/18/2022    Priority: 1.   Spondylosis without myelopathy or radiculopathy, lumbosacral region 11/18/2022    Priority: 1.   Grade 1 Retrolisthesis of L1/L2, L2/L3, & L3/L4 10/25/2022    Priority: 1.   Lumbar facet arthropathy (  Multilevel) (Bilateral) 10/25/2022    Priority: 1.   Lumbar foraminal stenosis (Bilateral: L1-2, L2-3, L3-4, L4-5) (Right - SEVERE: L4-5) 10/25/2022    Priority: 1.   Lumbar lateral recess stenosis (Bilateral: L3-4, L4-5) (Right - SEVERE: L4-5) 10/25/2022    Priority: 1.   Ligamentum flavum hypertrophy (L3-4, L4-5, L5-S1) 10/25/2022    Priority: 1.   Chronic low back pain (1ry area of  Pain) (Bilateral) w/o sciatica 10/25/2022    Priority: 1.   Grade 2 Anterolisthesis (7mm) of lumbar spine (L4/L5) 10/24/2022    Priority: 1.   Dextroscoliosis of lumbar spine 10/24/2022    Priority: 1.   Abnormal MRI, lumbar spine (05/19/2021) 10/24/2022    Priority: 1.   Spondylolisthesis of lumbar region 10/24/2022    Priority: 1.   Trigger middle finger of hand (Left) 11/12/2016    Priority: 1.    Medications- reviewed and updated Current Outpatient Medications  Medication Sig Dispense Refill   acetaminophen  (TYLENOL ) 500 MG tablet Take 500-1,000 mg by mouth every 6 (six) hours as needed (for pain.).      albuterol  (VENTOLIN  HFA) 108 (90 Base) MCG/ACT inhaler Inhale 2 puffs into the lungs every 6 (six) hours as needed for wheezing or shortness of breath. 1 each 0   amLODipine  (NORVASC ) 2.5 MG tablet TAKE 1 TABLET BY MOUTH EVERY DAY 90 tablet 1   aspirin  EC 81 MG tablet Take 81 mg by mouth daily. Swallow whole.     celecoxib  (CELEBREX ) 100 MG capsule Take 1 capsule (100 mg total) by mouth 2 (two) times daily. 60 capsule 0   gabapentin  (NEURONTIN ) 300 MG capsule Take 1 capsule (300 mg total) by mouth 3 (three) times daily. 90 capsule 3   Multiple Vitamin (MULTIVITAMIN WITH MINERALS) TABS tablet Take 1 tablet by mouth daily.     Naltrexone  HCl, Pain, (NALTREX) 4.5 MG CAPS Take 1 tablet by mouth daily. 30 capsule 5   rosuvastatin  (CRESTOR ) 5 MG tablet Take 1 tablet (5 mg total) by mouth daily. 90 tablet 3   No current facility-administered medications for this visit.     Objective:  BP 132/72 (BP Location: Left Arm, Patient Position: Sitting, Cuff Size: Normal)   Pulse (!) 56   Temp (!) 97.3 F (36.3 C) (Temporal)   Ht 5' 5 (1.651 m)   Wt 136 lb 9.6 oz (62 kg)   SpO2 99%   BMI 22.73 kg/m  Gen: NAD, resting comfortably CV: RRR no murmurs rubs or gallops Lungs: CTAB no crackles, wheeze, rhonchi Abdomen: soft/nontender/nondistended/normal bowel sounds. Ext: no edema Skin:  warm, dry Neuro: slow gait due pain in back    Assessment and Plan   Assessment & Plan Essential hypertension Blood pressure is well controlled on amlodipine  2.5 mg daily. Blood pressure readings show variability with occasional highs up to 159/80 mmHg and lows down to 99/72 mmHg but generally trend down into normal range with 3 checks at home. Current management is effective in maintaining overall control without causing symptomatic hypotension-she denies any symptoms when running on the lower end. - Continue amlodipine  2.5 mg daily. - Monitor blood pressure regularly. Degeneration of intervertebral disc of lumbosacral region with discogenic back pain Degenerative disc disease and lumbar spinal stenosis with chronic pain Chronic pain persists despite current regimen including low-dose naltrexone , gabapentin , and Celebrex . Recent consultation with Dr. Zach Smith of sports medicine recommended formal physical therapy and consideration of a spinal cord stimulator. Celebrex  is used in short bursts to minimize side effects such  as kidney damage and increased blood pressure-fortunately his blood pressure is tolerating this. Gabapentin  is currently taken at night at 300 mg, and there is concern about increasing the dose due to potential side effects such as imbalance and confusion. Naltrexone 's benefit is unclear, and a trial off is considered. - Continue gabapentin  at night at 300 mg. - Trial off naltrexone  and monitor for worsening pain. - Continue Celebrex  in short bursts as prescribed.  But he is aware of risks to the kidneys and heart. - Engage in formal physical therapy. - Consider spinal cord stimulator if pain persists. - Avoid ibuprofen while on Celebrex -in general I would not recommend this for him. -If he comes off of naltrexone  and does not report any worsening could also trial another dose of gabapentin  in the daytime but make sure to have family close to check on his stability and  cognition-has reported some issues with foggy headedness over time so I want to be cautious Hyperlipidemia, unspecified hyperlipidemia type Hyperlipidemia with stroke and carotid artery stenosis, status post endarterectomy LDL cholesterol is well controlled with rosuvastatin  5 mg daily, with levels under 55 mg/dL, which is ideal for his stroke and carotid artery stenosis. - Continue rosuvastatin  5 mg daily. - Continue aspirin  for stroke prevention. Foraminal stenosis of lumbar region    #Obstructive sleep apnea (suspected) Home sleep test results are concerning, and follow-up with pulmonology is planned. Side sleeping may reduce symptoms. - Follow up with pulmonology for sleep study results. - Encourage side sleeping to potentially reduce apnea symptoms.  # Fatigue Persistent fatigue, possibly related to sleep apnea or other underlying conditions. Labs from February showed no obvious cause. Sleep apnea management may improve symptoms. - Discuss sleep apnea management with pulmonologist. - Consider repeating labs if symptoms persist.  He wanted to hold off on repeating these today  Depression Chronic pain and reduced mobility contribute to feelings of discouragement and depression. No specific antidepressant therapy discussed-she has been on Cymbalta  in the past without improvement in pain and this did not improve his discouragement-the main trouble is poor pain control. - Encourage engagement in activities and travel despite pain.     Recommended follow up: Return in about 3 months (around 01/23/2024) for followup or sooner if needed.Schedule b4 you leave. Future Appointments  Date Time Provider Department Center  11/14/2023 10:00 AM Neysa Reggy BIRCH, MD [facility] [address]  12/18/2023  9:30 AM Leigh Venetia CROME, MD [facility] None  01/14/2024 10:15 AM Claudene Arthea HERO, DO [facility] None  01/21/2024 11:00 AM Katrinka Garnette KIDD, MD [facility]  03/04/2024  2:30 PM Dina Camie BRAVO,  PA-C [facility] None  05/25/2024  8:30 AM Kdeiss, Bianca [facility] None  05/25/2024  9:30 AM [facility] NEUROPSYCH TECH2 [facility] None  06/01/2024 10:00 AM Kdeiss, Bianca [facility] None  06/09/2024  9:30 AM Dina Camie BRAVO, PA-C [facility] None  07/06/2024 10:00 AM [facility] ANNUAL WELLNESS VISIT 1 [facility] [place]    Lab/Order associations:   ICD-10-CM   1. Essential hypertension  I10     2. Degeneration of intervertebral disc of lumbosacral region with discogenic back pain  M51.370     3. Hyperlipidemia, unspecified hyperlipidemia type  E78.5     4. Foraminal stenosis of lumbar region  M48.061       No orders of the defined types were placed in this encounter.   Return precautions advised.  Garnette Katrinka, MD

## 2023-10-24 NOTE — Assessment & Plan Note (Signed)
 Hyperlipidemia with stroke and carotid artery stenosis, status post endarterectomy LDL cholesterol is well controlled with rosuvastatin  5 mg daily, with levels under 55 mg/dL, which is ideal for his stroke and carotid artery stenosis. - Continue rosuvastatin  5 mg daily. - Continue aspirin  for stroke prevention.

## 2023-10-27 ENCOUNTER — Emergency Department (HOSPITAL_COMMUNITY)

## 2023-10-27 ENCOUNTER — Other Ambulatory Visit: Payer: Self-pay

## 2023-10-27 ENCOUNTER — Encounter (HOSPITAL_COMMUNITY): Payer: Self-pay

## 2023-10-27 ENCOUNTER — Emergency Department (HOSPITAL_COMMUNITY)
Admission: EM | Admit: 2023-10-27 | Discharge: 2023-10-28 | Disposition: A | Discharge status: Home / Self Care | Attending: Emergency Medicine | Admitting: Emergency Medicine

## 2023-10-27 DIAGNOSIS — Z8673 Personal history of transient ischemic attack (TIA), and cerebral infarction without residual deficits: Secondary | ICD-10-CM | POA: Insufficient documentation

## 2023-10-27 DIAGNOSIS — M25511 Pain in right shoulder: Secondary | ICD-10-CM | POA: Insufficient documentation

## 2023-10-27 DIAGNOSIS — S0990XA Unspecified injury of head, initial encounter: Secondary | ICD-10-CM | POA: Diagnosis present

## 2023-10-27 DIAGNOSIS — Z7982 Long term (current) use of aspirin: Secondary | ICD-10-CM | POA: Diagnosis not present

## 2023-10-27 DIAGNOSIS — Y9301 Activity, walking, marching and hiking: Secondary | ICD-10-CM | POA: Insufficient documentation

## 2023-10-27 DIAGNOSIS — W01198A Fall on same level from slipping, tripping and stumbling with subsequent striking against other object, initial encounter: Secondary | ICD-10-CM | POA: Diagnosis not present

## 2023-10-27 DIAGNOSIS — S0101XA Laceration without foreign body of scalp, initial encounter: Secondary | ICD-10-CM | POA: Insufficient documentation

## 2023-10-27 DIAGNOSIS — Z79899 Other long term (current) drug therapy: Secondary | ICD-10-CM | POA: Diagnosis not present

## 2023-10-27 DIAGNOSIS — Z23 Encounter for immunization: Secondary | ICD-10-CM | POA: Insufficient documentation

## 2023-10-27 DIAGNOSIS — M545 Low back pain, unspecified: Secondary | ICD-10-CM | POA: Insufficient documentation

## 2023-10-27 DIAGNOSIS — Z043 Encounter for examination and observation following other accident: Secondary | ICD-10-CM | POA: Diagnosis not present

## 2023-10-27 DIAGNOSIS — M19011 Primary osteoarthritis, right shoulder: Secondary | ICD-10-CM | POA: Diagnosis not present

## 2023-10-27 DIAGNOSIS — W19XXXA Unspecified fall, initial encounter: Secondary | ICD-10-CM

## 2023-10-27 MED ORDER — LIDOCAINE-EPINEPHRINE-TETRACAINE (LET) TOPICAL GEL
3.0000 mL | Freq: Once | TOPICAL | Status: AC
  Administered 2023-10-28: 3 mL via TOPICAL
  Filled 2023-10-27: qty 3

## 2023-10-27 MED ORDER — TETANUS-DIPHTH-ACELL PERTUSSIS 5-2.5-18.5 LF-MCG/0.5 IM SUSY
0.5000 mL | PREFILLED_SYRINGE | Freq: Once | INTRAMUSCULAR | Status: AC
  Administered 2023-10-28: 0.5 mL via INTRAMUSCULAR
  Filled 2023-10-27: qty 0.5

## 2023-10-27 NOTE — ED Triage Notes (Signed)
 Pt presents via POV c/o fall while walking dog. Pt has laceration noted to forehead. Bleeding controlled at this time. Denies LOC. Denies anti-coagulation. A&O x4. Reports right shoulder pain. Denies neck pain.

## 2023-10-27 NOTE — ED Provider Notes (Signed)
 [place] EMERGENCY DEPARTMENT AT [facility] Provider Note   CSN: [id number] Arrival date & time: 10/27/23  2256     Patient presents with: Head Injury   Ricky [name]. is a 78 y.o. male with history of stroke, pneumonia, peripheral vascular disease, retention, arthritis.  Patient presents to ED for evaluation of fall.  States that he was walking his dogs prior to arrival when one of his dogs saw a rabbit, chased after it and he fell forward striking his forehead on what he believes is a stonewall.  He denies LOC, denies blood thinners.  He reports having a gash to his forehead but denies headache or neck pain.  He denies any chest pain or shortness of breath.  He does endorse low back pain but reports he typically has low back pain and there are no new features to this tonight.  Denies any bowel bladder incontinence, groin numbness or distal weakness.  Denies lightheadedness, dizziness, weakness.   Head Injury      Prior to Admission medications   Medication Sig Start Date End Date Taking? Authorizing Provider  acetaminophen  (TYLENOL ) 500 MG tablet Take 500-1,000 mg by mouth every 6 (six) hours as needed (for pain.).     [provider]  albuterol  (VENTOLIN  HFA) 108 (90 Base) MCG/ACT inhaler Inhale 2 puffs into the lungs every 6 (six) hours as needed for wheezing or shortness of breath. 10/28/20   Katrinka Garnette KIDD, MD  amLODipine  (NORVASC ) 2.5 MG tablet TAKE 1 TABLET BY MOUTH EVERY DAY 02/15/23   Katrinka Garnette KIDD, MD  aspirin  EC 81 MG tablet Take 81 mg by mouth daily. Swallow whole.    [provider]  celecoxib  (CELEBREX ) 100 MG capsule Take 1 capsule (100 mg total) by mouth 2 (two) times daily. 10/15/23   Claudene Arthea HERO, DO  gabapentin  (NEURONTIN ) 300 MG capsule Take 1 capsule (300 mg total) by mouth 3 (three) times daily. 05/30/23   Katrinka Garnette KIDD, MD  Multiple Vitamin (MULTIVITAMIN WITH MINERALS) TABS tablet Take 1 tablet by mouth daily.     [provider]  Naltrexone  HCl, Pain, (NALTREX) 4.5 MG CAPS Take 1 tablet by mouth daily. 06/19/23   Katrinka Garnette KIDD, MD  rosuvastatin  (CRESTOR ) 5 MG tablet Take 1 tablet (5 mg total) by mouth daily. 12/27/22   Katrinka Garnette KIDD, MD    Allergies: Amoxicillin    Review of Systems  Updated Vital Signs BP (!) 207/94 (BP Location: Right Arm)   Pulse 64   Temp 98.3 F (36.8 C)   Resp 18   SpO2 100%   Physical Exam  (all labs ordered are listed, but only abnormal results are displayed) Labs Reviewed - No data to display  EKG: None  Radiology: No results found.  {Document cardiac monitor, telemetry assessment procedure when appropriate:32947} Procedures   Medications Ordered in the ED  Tdap (BOOSTRIX) injection 0.5 mL (has no administration in time range)      {Click here for ABCD2, HEART and other calculators REFRESH Note before signing:1}                              Medical Decision Making Amount and/or Complexity of Data Reviewed Radiology: ordered.  Risk Prescription drug management.   ***  {Document critical care time when appropriate  Document review of labs and clinical decision tools ie CHADS2VASC2, etc  Document your independent review of radiology images and any  outside records  Document your discussion with family members, caretakers and with consultants  Document social determinants of health affecting pt's care  Document your decision making why or why not admission, treatments were needed:32947:::1}   Final diagnoses:  None    ED Discharge Orders     None

## 2023-10-28 DIAGNOSIS — S0101XA Laceration without foreign body of scalp, initial encounter: Secondary | ICD-10-CM | POA: Diagnosis not present

## 2023-10-28 DIAGNOSIS — Z043 Encounter for examination and observation following other accident: Secondary | ICD-10-CM | POA: Diagnosis not present

## 2023-10-28 NOTE — Discharge Instructions (Addendum)
 It was a pleasure taking part in your care.  As discussed, please follow-up with PCP in 10 days for removal of staples.  Please read attached guide concerning nonsutured laceration care.  Take Tylenol  at home for pain.  Please return to ED with new symptoms.

## 2023-10-29 ENCOUNTER — Encounter: Payer: Self-pay | Admitting: Family Medicine

## 2023-10-29 DIAGNOSIS — M5137 Other intervertebral disc degeneration, lumbosacral region with discogenic back pain only: Secondary | ICD-10-CM | POA: Diagnosis not present

## 2023-11-03 DIAGNOSIS — Z23 Encounter for immunization: Secondary | ICD-10-CM | POA: Diagnosis not present

## 2023-11-04 ENCOUNTER — Telehealth: Payer: Self-pay

## 2023-11-04 DIAGNOSIS — M5137 Other intervertebral disc degeneration, lumbosacral region with discogenic back pain only: Secondary | ICD-10-CM | POA: Diagnosis not present

## 2023-11-04 NOTE — Telephone Encounter (Signed)
 Transition Care Management Follow-up Telephone Call Date of discharge and from where: 10/28/23 [name] How have you been since you were released from the hospital? [facility] Any questions or concerns? No, already spoke with PCP via MyChart  Items Reviewed: Did the pt receive and understand the discharge instructions provided? Yes  Medications obtained and verified? Yes  Other? No  Any new allergies since your discharge? No  Dietary orders reviewed? No Do you have support at home? Yes   Home Care and Equipment/Supplies: Were home health services ordered? not applicable If so, what is the name of the agency?   Has the agency set up a time to come to the patient's home? not applicable Were any new equipment or medical supplies ordered?  No What is the name of the medical supply agency?  Were you able to get the supplies/equipment? not applicable Do you have any questions related to the use of the equipment or supplies? No  Functional Questionnaire: (I = Independent and D = Dependent) ADLs: I  Bathing/Dressing- I  Meal Prep- I  Eating- I  Maintaining continence- I  Transferring/Ambulation- I  Managing Meds- I  Follow up appointments reviewed:  PCP Hospital f/u appt confirmed? Yes  Scheduled to see Garnette Lukes, MD on 11/05/23 @ 11:20am. Specialist [facility] f/u appt confirmed? NA Are transportation arrangements needed? No  If their condition worsens, is the pt aware to call PCP or go to the Emergency Dept.? Yes Was the patient provided with contact information for the PCP's office or ED? Yes Was to pt encouraged to call back with questions or concerns? Yes

## 2023-11-05 ENCOUNTER — Ambulatory Visit (INDEPENDENT_AMBULATORY_CARE_PROVIDER_SITE_OTHER): Admitting: Family Medicine

## 2023-11-05 ENCOUNTER — Encounter: Payer: Self-pay | Admitting: Family Medicine

## 2023-11-05 VITALS — BP 112/68 | HR 56 | Temp 97.5°F | Ht 65.0 in | Wt 135.2 lb

## 2023-11-05 DIAGNOSIS — E785 Hyperlipidemia, unspecified: Secondary | ICD-10-CM | POA: Diagnosis not present

## 2023-11-05 DIAGNOSIS — S0181XA Laceration without foreign body of other part of head, initial encounter: Secondary | ICD-10-CM

## 2023-11-05 DIAGNOSIS — M5136 Other intervertebral disc degeneration, lumbar region with discogenic back pain only: Secondary | ICD-10-CM | POA: Diagnosis not present

## 2023-11-05 DIAGNOSIS — I10 Essential (primary) hypertension: Secondary | ICD-10-CM | POA: Diagnosis not present

## 2023-11-05 DIAGNOSIS — H53143 Visual discomfort, bilateral: Secondary | ICD-10-CM | POA: Diagnosis not present

## 2023-11-05 DIAGNOSIS — H04123 Dry eye syndrome of bilateral lacrimal glands: Secondary | ICD-10-CM | POA: Diagnosis not present

## 2023-11-05 NOTE — Progress Notes (Signed)
 Phone [phone] In person visit   Subjective:   Ricky [name]. is a 78 y.o. year old very pleasant male patient who presents for/with See problem oriented charting Chief Complaint  Patient presents with   Hospitalization Follow-up   Suture / Staple Removal   Discussed the use of AI scribe software for clinical note transcription with the patient, who gave verbal consent to proceed.  History of Present Illness   Ricky [name]. is a 78 year old male who presents for emergency department follow-up after a fall resulting in a forehead laceration.  On October 27, 2023, he experienced a fall while walking his dogs. His dog chased a rabbit, causing him to be pulled forward and strike his forehead on a stone wall (although today he feels less clear about the full course of events-felt like he was foggy after the fall and may have had a concussion though feels more clear now), resulting in a 3.5 cm laceration that required four staples. He is unsure if he got dizzy or if the steps overlapped, leading to the fall. He describes the period from taking the next step down to realizing he couldn't get up as 'kind of fuzzy'.No headache or neck pain followed the incident. A CT scan of his head and cervical spine was performed in the emergency department and was unremarkable. His tetanus shot was updated during this visit.  His low back pain was noted during the emergency department visit but has not worsened since the fall instead ongoing chronic issue. He mentions feeling a lack of strength and difficulty getting up after the fall, which he attributes to his general condition.   He is taking amlodipine  2.5 mg daily, and his blood pressure was measured at 112/68. He is taking rosuvastatin  5 mg daily for cholesterol management and has a history of stroke.  He has received his flu and COVID vaccinations, as well as a tetanus shot. He occasionally gets tired quickly and is always ready to 'nod off'.  No chest pain, shortness of breath, or other injuries since the fall.       Past Medical History-  Patient Active Problem List   Diagnosis Date Noted   Memory impairment 01/25/2023    Priority: High   DDD (degenerative disc disease), lumbosacral 10/24/2022    Priority: High   Chronic pain syndrome 10/22/2022    Priority: High   Hemiparesis affecting dominant side as late effect of stroke (HCC) 03/08/2016    Priority: High   History of CVA (cerebrovascular accident) 02/28/2016    Priority: High   Emphysema lung (HCC) 08/25/2021    Priority: Medium    Hyperglycemia 08/25/2021    Priority: Medium    Carotid artery stenosis, symptomatic (Right) 03/19/2016    Priority: Medium    Hyperlipidemia 03/08/2016    Priority: Medium    Essential hypertension 12/20/2014    Priority: Medium    Senile purpura 08/25/2021    Priority: Low   DDD (degenerative disc disease), lumbar 02/09/2021    Priority: Low   Acquired leg length discrepancy 02/09/2021    Priority: Low   Leukopenia 01/08/2017    Priority: Low   OA (osteoarthritis) of knee 12/14/2013    Priority: Low   Lumbar facet joint pain 11/18/2022    Priority: 1.   Spondylosis without myelopathy or radiculopathy, lumbosacral region 11/18/2022    Priority: 1.   Grade 1 Retrolisthesis of L1/L2, L2/L3, & L3/L4 10/25/2022    Priority: 1.   Lumbar facet  arthropathy (Multilevel) (Bilateral) 10/25/2022    Priority: 1.   Lumbar foraminal stenosis (Bilateral: L1-2, L2-3, L3-4, L4-5) (Right - SEVERE: L4-5) 10/25/2022    Priority: 1.   Lumbar lateral recess stenosis (Bilateral: L3-4, L4-5) (Right - SEVERE: L4-5) 10/25/2022    Priority: 1.   Ligamentum flavum hypertrophy (L3-4, L4-5, L5-S1) 10/25/2022    Priority: 1.   Chronic low back pain (1ry area of Pain) (Bilateral) w/o sciatica 10/25/2022    Priority: 1.   Grade 2 Anterolisthesis (7mm) of lumbar spine (L4/L5) 10/24/2022    Priority: 1.   Dextroscoliosis of lumbar spine 10/24/2022     Priority: 1.   Abnormal MRI, lumbar spine (05/19/2021) 10/24/2022    Priority: 1.   Spondylolisthesis of lumbar region 10/24/2022    Priority: 1.   Trigger middle finger of hand (Left) 11/12/2016    Priority: 1.    Medications- reviewed and updated Current Outpatient Medications  Medication Sig Dispense Refill   acetaminophen  (TYLENOL ) 500 MG tablet Take 500-1,000 mg by mouth every 6 (six) hours as needed (for pain.).      albuterol  (VENTOLIN  HFA) 108 (90 Base) MCG/ACT inhaler Inhale 2 puffs into the lungs every 6 (six) hours as needed for wheezing or shortness of breath. 1 each 0   amLODipine  (NORVASC ) 2.5 MG tablet TAKE 1 TABLET BY MOUTH EVERY DAY 90 tablet 1   aspirin  EC 81 MG tablet Take 81 mg by mouth daily. Swallow whole.     celecoxib  (CELEBREX ) 100 MG capsule Take 1 capsule (100 mg total) by mouth 2 (two) times daily. 60 capsule 0   gabapentin  (NEURONTIN ) 300 MG capsule Take 1 capsule (300 mg total) by mouth 3 (three) times daily. 90 capsule 3   Multiple Vitamin (MULTIVITAMIN WITH MINERALS) TABS tablet Take 1 tablet by mouth daily.     rosuvastatin  (CRESTOR ) 5 MG tablet Take 1 tablet (5 mg total) by mouth daily. 90 tablet 3   No current facility-administered medications for this visit.     Objective:  BP 112/68 (BP Location: Left Arm, Patient Position: Sitting, Cuff Size: Normal)   Pulse (!) 56   Temp (!) 97.5 F (36.4 C) (Temporal)   Ht 5' 5 (1.651 m)   Wt 135 lb 3.2 oz (61.3 kg)   SpO2 97%   BMI 22.50 kg/m  Gen: NAD, resting comfortably CV: RRR no murmurs rubs or gallops Lungs: CTAB no crackles, wheeze, rhonchi Abdomen: soft/nontender/nondistended/normal bowel sounds. No rebound or guarding.  Ext: no edema Skin: warm, dry 3.5 cm scabbed over laceration right forehead, several abrasions over the hand that are dressed.  No areas does he appear to have infection surrounding wound-no erythema or warmth or tenderness Neuro: grossly normal, moves all extremities      Assessment and Plan   # Emergency department follow-up  # Forehead laceration, status post staple removal Healing well with no separation. Scab present, making deeper healing assessment difficult. - Avoid scrubbing; allow soapy water to run over during showers. - Avoid pulling off the scab to prevent reopening. - Stay out of the pool for another week until scab falls off and deeper healing is confirmed. - If not submerging, may return to the pool sooner with caution. -Discussed importance of fall avoidance-can consider a walker or cane but the biggest thing is he plans to avoid walking the dog especially at night  # Multiple hand/finger laceration Laceration on the knuckle, likely due to trauma, causing tenderness.  Also on the dorsum of his hand.  We redressed 3 wounds - Apply topical antibiotic to keep the wound moist. - Avoid using alcohol inside the wound; clean around it instead. - Apply a Band-Aid to protect the area.  # Chronic low back pain Chronic low back pain, not worsening after recent fall.  See most recent note but he is currently on Celebrex  up to twice daily at 100 mg dose and gabapentin  3 mg up to 3 times daily  # Hypertension Well controlled on current medication regimen. - Continue amlodipine  2.5 mg daily.  # Hyperlipidemia Well controlled on current medication regimen. LDL goal is below 70, ideally below 55, due to stroke history. - Continue rosuvastatin  5 mg daily.     Recommended follow up: Return for next already scheduled visit or sooner if needed. Future Appointments  Date Time Provider Department Center  11/14/2023 10:00 AM Neysa Reggy BIRCH, MD [facility] [address]  12/18/2023  9:30 AM Leigh Venetia CROME, MD [facility] None  01/14/2024 10:15 AM Claudene Arthea HERO, DO [facility] None  01/21/2024 11:00 AM Katrinka Garnette KIDD, MD [facility]  03/04/2024  2:30 PM Dina Camie BRAVO, PA-C [facility] None  05/25/2024  8:30 AM Kdeiss, Bianca [facility] None   05/25/2024  9:30 AM [facility] NEUROPSYCH TECH2 [facility] None  06/01/2024 10:00 AM Kdeiss, Bianca [facility] None  06/09/2024  9:30 AM Dina Camie BRAVO, PA-C [facility] None  07/06/2024 10:00 AM [facility] ANNUAL WELLNESS VISIT 1 [facility] [place]    Lab/Order associations:   ICD-10-CM   1. Laceration of forehead, initial encounter  S01.81XA     2. Essential hypertension  I10     3. Hyperlipidemia, unspecified hyperlipidemia type  E78.5     4. Degeneration of intervertebral disc of lumbar region with discogenic back pain  M51.360       No orders of the defined types were placed in this encounter.   Return precautions advised.  Garnette Katrinka, MD

## 2023-11-05 NOTE — Patient Instructions (Addendum)
 Wound is healing well on the head  Rebandaged the hand but no signs of infection in any of these areas  Critical to avoid falls  No changes today  Recommended follow up: Return for next already scheduled visit or sooner if needed.

## 2023-11-06 ENCOUNTER — Other Ambulatory Visit: Payer: Self-pay | Admitting: Family Medicine

## 2023-11-07 ENCOUNTER — Encounter: Payer: Self-pay | Admitting: Family Medicine

## 2023-11-12 DIAGNOSIS — M5137 Other intervertebral disc degeneration, lumbosacral region with discogenic back pain only: Secondary | ICD-10-CM | POA: Diagnosis not present

## 2023-11-13 NOTE — Progress Notes (Signed)
 HPI M never smoker followed for OSA and brain fog, complicated by Lumbar DDD., HTN, ASCVD, Hx CVA,  HST- 09/20/23- AHI 49.9/hr, desat to 85%, body weight 132 lbs  =======================================================================================================================================  08/13/23- 78yoM never smoker for sleep evaluation courtesy of Dr Garnette Lukes with concern of snoring and brain fog. Lumbar DDD., HTN, ASCVD, Hx CVA,  Epworth score-8 Body weight today-135 lbs Discussed the use of AI scribe software for clinical note transcription with the patient, who gave verbal consent to proceed.  History of Present Illness   Ricky Boursiquot. is a 78 year old male who presents with concerns about snoring and potential sleep apnea.  He experiences occasional snoring, particularly when sleeping on his back due to back problems. His wife has observed episodes of apnea during sleep, raising concerns about potential sleep apnea.   Sleep is impacted by chronic back pain which restricts his sleep position usually to supine.  He generally feels rested during the day unless he wakes up multiple times at night to urinate. He takes naltrexone  and gabapentin  in the evening, which may aid his sleep. He limits caffeine intake to mornings and occasionally after dinner.  He has a history of a small spot of emphysema discovered years ago after symptoms similar to pneumonia. He was prescribed an inhaler initially but has not needed it recently. He has no current lung or breathing problems and has never smoked.  Family history includes a brother with unspecified sleep issues. He had his tonsils removed many years ago but no other surgeries related to his nose or throat.       Assessment and Plan:    Sleep apnea suspected Possible sleep apnea due to snoring and apnea episodes. Nocturnal symptoms may worsen in supine position. Discussed untreated sleep apnea risks: cardiovascular events,  hypertension, cognitive decline. Explained sleep apnea mechanics and impact on sleep quality and alertness. - Order home sleep study. - Discussed home sleep study benefits and details. - Explained treatment options: CPAP and dental appliances, emphasizing CPAP's safety and effectiveness.  Degenerative Disc Disease Chronic back pain     ASCVD -hx CVA    11/14/23- 78 yoM never smoker followed for OSA and brain fog, complicated by Lumbar DDD., HTN, ASCVD, Hx CVA,  HST- 09/20/23- AHI 49.9/hr, desat to 85%, body weight 132 lbs Body weight today-136 lbs For treatment decision Discussed the use of AI scribe software for clinical note transcription with the patient, who gave verbal consent to proceed.  History of Present Illness   Ricky Gavia. is a 78 year old male who presents for evaluation of sleep apnea.  A sleep study reveals apnea episodes approximately fifty times per hour with associated drops in oxygen saturation during sleep. He prefers sleeping on his side due to difficulty sleeping on his back, though this sometimes causes back discomfort. He experiences frequent awakenings at night and is often unaware of the breathing issues upon waking. Treatment options were discussed, identifying CPAP as the best initial therapy for him. He is not much aware of the apnea events and wants to talk it over with his wife. He is being given patient information on CPAP.     Assessment and Plan:    Obstructive sleep apnea Severe obstructive sleep apnea confirmed by sleep study with 50 apneic events per hour and significant oxygen desaturation, worse when supine. - Recommend CPAP therapy. - Discussed alternative treatments including fitted mouthpieces and surgery.  - Emphasized CPAP's safety, effectiveness, and non-habit forming nature. - Explained  CPAP is adjustable and can be discontinued if not tolerated. - Provide CPAP information handout. - Discuss CPAP therapy with spouse. - Contact  clinic if decision is made to proceed with CPAP. - Order CPAP through home care company if he decides to proceed.      ROS-see HPI   + = positive Constitutional:    weight loss, night sweats, fevers, chills, fatigue, lassitude. HEENT:    headaches, difficulty swallowing, tooth/dental problems, sore throat,       sneezing, itching, ear ache, nasal congestion, post nasal drip, snoring CV:    chest pain, orthopnea, PND, swelling in lower extremities, anasarca,                                   dizziness, palpitations Resp:   shortness of breath with exertion or at rest.                productive cough,   non-productive cough, coughing up of blood.              change in color of mucus.  wheezing.   Skin:    rash or lesions. GI:  No-   heartburn, indigestion, abdominal pain, nausea, vomiting, diarrhea,                 change in bowel habits, loss of appetite GU: dysuria, change in color of urine, no urgency or frequency.   flank pain. MS:   joint pain, stiffness, decreased range of motion, back pain. Neuro-     nothing unusual Psych:  change in mood or affect.  depression or anxiety.   memory loss.  OBJ- Physical Exam General- Alert, Oriented, Affect-appropriate, Distress- none acute, +not obese Skin- rash-none, lesions- none, excoriation- none Lymphadenopathy- none Head- atraumatic            Eyes- Gross vision intact, PERRLA, conjunctivae and secretions clear            Ears- Hearing, canals-normal            Nose- Clear, no-Septal dev, mucus, polyps, erosion, perforation             Throat- Mallampati II , mucosa clear , drainage- none, tonsils- absent, +teeth Neck- flexible , trachea midline, no stridor , thyroid  nl, carotid no bruit Chest - symmetrical excursion , unlabored           Heart/CV- RRR , no murmur , no gallop  , no rub, nl s1 s2                           - JVD- none , edema- none, stasis changes- none, varices- none           Lung- clear to P&A, wheeze- none, cough-  none , dullness-none, rub- none           Chest wall-  Abd-  Br/ Gen/ Rectal- Not done, not indicated Extrem- cyanosis- none, clubbing, none, atrophy- none, strength- nl Neuro- grossly intact to observation

## 2023-11-14 ENCOUNTER — Ambulatory Visit: Admitting: Internal Medicine

## 2023-11-14 ENCOUNTER — Encounter: Payer: Self-pay | Admitting: Internal Medicine

## 2023-11-14 VITALS — BP 150/84 | HR 60 | Temp 97.7°F | Ht 65.0 in | Wt 136.6 lb

## 2023-11-14 DIAGNOSIS — G4733 Obstructive sleep apnea (adult) (pediatric): Secondary | ICD-10-CM

## 2023-11-14 DIAGNOSIS — J439 Emphysema, unspecified: Secondary | ICD-10-CM

## 2023-11-14 NOTE — Patient Instructions (Signed)
 You have severe obstructive sleep apnea, stopping breathing about 50 times/ hour. CPAP is the most appropriate treatment to try. If you can't get comfortable with it, you can stop using it. We would be ordering it for you through a n=home-care company and they would discus the details and teach you haw to use it.  If  you decide to try CPAP, let us  know and we can order it for you.

## 2023-11-18 ENCOUNTER — Encounter: Payer: Self-pay | Admitting: Family Medicine

## 2023-11-19 DIAGNOSIS — M5137 Other intervertebral disc degeneration, lumbosacral region with discogenic back pain only: Secondary | ICD-10-CM | POA: Diagnosis not present

## 2023-11-22 ENCOUNTER — Ambulatory Visit: Payer: Self-pay

## 2023-11-22 ENCOUNTER — Institutional Professional Consult (permissible substitution): Payer: Medicare Other | Admitting: Psychology

## 2023-11-29 ENCOUNTER — Encounter: Payer: Medicare Other | Admitting: Psychology

## 2023-12-02 ENCOUNTER — Telehealth: Payer: Self-pay

## 2023-12-02 DIAGNOSIS — G4733 Obstructive sleep apnea (adult) (pediatric): Secondary | ICD-10-CM

## 2023-12-02 NOTE — Telephone Encounter (Signed)
 ATC x1. LDVMTCB. Order placed.

## 2023-12-02 NOTE — Telephone Encounter (Signed)
 Copied from CRM [id number]. Topic: General - Other >> Dec 02, 2023 10:07 AM Essie A wrote: Reason for CRM: Patient is calling to get a CPAP ordered per Dr. Neysa.  Please let him know when the order has been placed and when he can expect it.  Will it be coming to his home or to the office?  His phone number is [phone].  Thanks.  Dr. Neysa please advise if okay to place CPAP order and what the settings are.

## 2023-12-02 NOTE — Telephone Encounter (Signed)
 Please order new DME, new CPAP auto 5-20, mask of choice, humidifier, supplies, Airview/ card  He will need an office appt in 31-90 days per insurance regs

## 2023-12-03 NOTE — Telephone Encounter (Signed)
 ATC X2. LMTCB. Sedning pt a mychart message then completing note per protocol.

## 2023-12-05 ENCOUNTER — Other Ambulatory Visit: Payer: Self-pay | Admitting: Family Medicine

## 2023-12-06 NOTE — Progress Notes (Signed)
 /  Initial neurology clinic note / Reason for Evaluation: Consultation requested by Ricky Garnette KIDD, MD for an opinion regarding weakness. My final recommendations will be communicated back to the requesting physician by way of shared medical record or letter to requesting physician via [place]  mail.  HPI: This is [name]., a 78 y.o. left-handed male with a medical history of stroke in right MCA territory (03/01/16), pre-DM, COPD, HTN, HLD DDD of lumbar spine c/b chronic low back pain, OSA, and bilateral rotator cuff surgery who presents to neurology clinic with the chief complaint of weakness. The patient is accompanied by wife.  Patient has had back pain for a few years. He has seen pain management and medication and injections, none of which helps. He has lumbar stenosis by MRI lumbar spine in 2023, but he has not seen spine surgery. He was told that surgery may be difficult, because of that he had not seen surgery.  He has weakness for at least a year. This has been slowly progressive. 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 He cannot lift milk into the fridge or lift dishes into the cabinet. He does not think his hands are weak. 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 He may have a little shoulder pain. He denies neck pain. He denies any numbness or tingling in any extremity.  He has some leg weakness as well. He can climb stairs, but holds on and is slower. Patient has had one fall, around 6 weeks ago (10/27/23). He was going down steps and slipped. It was dark and he thinks he didn't see anything. He cut his head. He went to the ED and got staples. CT head and cervical spine were unremarkable. He also had a right shoulder xray that showed severe degenerative changes of AC joint and findings of compatible with chronic rotator cuff tendinopathy. His orthopedic doctor is Dr. Claudene who he last saw 10/15/23 for low back pain. He has not talked to Dr. Claudene about his shoulders  recently. He has a history of bilateral rotator cuff surgery many years ago by Dr. Jane.  He takes gabapentin  300 mg at bedtime. He has tried PT, acupuncture, and chiropractor in the past.  Due to pain, patient is not very active. Patient has recently restarted PT weekly. He admits to not doing exercises at home though.  He denies cramps or twitching. He denies any difficulties with ptosis, diplopia, chewing or swallowing, or shortness of breath.  He does not report any constitutional symptoms like fever, night sweats, anorexia or unintentional weight loss.  EtOH use: none for a very long time  Restrictive diet? No Family history of neuropathy/myopathy/NM disease? None  Of note, patient had a stroke with left hand clumsiness ~02/2016. This was thought to be embolic due to proximal right ICA stenosis. He went to PT and feels this helped. He may be a little clumsy and not feel things as well in his left hand. He is left handed and still writes with the left hand. He is on on asa 81 mg and Crestor  5 mg daily.  Patient sees Ricky [name] in this office for cognitive changes. He has fogginess of thoughts that is getting worse. Prior testing did not met criteria for mild cognitive impairment, but this is being monitored. Patient recently had a sleep study and was told he had severe sleep apnea. He has a CPAP on order.  MEDICATIONS:  Outpatient Encounter Medications as of  12/18/2023  Medication Sig Note   acetaminophen  (TYLENOL ) 500 MG tablet Take 500-1,000 mg by mouth every 6 (six) hours as needed (for pain.).  10/01/2019: As needed    albuterol  (VENTOLIN  HFA) 108 (90 Base) MCG/ACT inhaler Inhale 2 puffs into the lungs every 6 (six) hours as needed for wheezing or shortness of breath.    amLODipine  (NORVASC ) 2.5 MG tablet Take 1 tablet (2.5 mg total) by mouth daily.    aspirin  EC 81 MG tablet Take 81 mg by mouth daily. Swallow whole.    celecoxib  (CELEBREX ) 100 MG capsule TAKE 1 CAPSULE BY MOUTH  TWICE A DAY    gabapentin  (NEURONTIN ) 300 MG capsule Take 1 capsule (300 mg total) by mouth 3 (three) times daily. (Patient taking differently: Take 300 mg by mouth once.)    Multiple Vitamin (MULTIVITAMIN WITH MINERALS) TABS tablet Take 1 tablet by mouth daily.    rosuvastatin  (CRESTOR ) 5 MG tablet TAKE 1 TABLET (5 MG TOTAL) BY MOUTH DAILY.    [DISCONTINUED] amLODipine  (NORVASC ) 2.5 MG tablet TAKE 1 TABLET BY MOUTH EVERY DAY    [DISCONTINUED] celecoxib  (CELEBREX ) 100 MG capsule Take 1 capsule (100 mg total) by mouth 2 (two) times daily.    No facility-administered encounter medications on file as of 12/18/2023.    PAST MEDICAL HISTORY: Past Medical History:  Diagnosis Date   Arthritis    Carotid artery occlusion    Complication of anesthesia    slow to wake up after shoulder surgeries   Hyperlipidemia 03/08/2016   LDL 75 pre stroke. Goal <70 so on atorvastatin  20mg  every other day   Hypertension    Peripheral vascular disease    Pneumonia    Stroke [facility])    still has decreased sensation in left hand and arm (as of 03/09/16)    PAST SURGICAL HISTORY: Past Surgical History:  Procedure Laterality Date   CAROTID ENDARTERECTOMY     CATARACT EXTRACTION W/ INTRAOCULAR LENS IMPLANT Bilateral 2024   ENDARTERECTOMY Right 03/19/2016   Procedure: ENDARTERECTOMY CAROTID;  Surgeon: Ricky FORBES Haddock, MD;  Location: [facility] OR;  Service: Vascular;  Laterality: Right;   PATCH ANGIOPLASTY Right 03/19/2016   Procedure: PATCH ANGIOPLASTY OF RIGHT CAROTID USING HEMASHIELD PLATINUM FINESSE;  Surgeon: Ricky FORBES Haddock, MD;  Location: [facility] OR;  Service: Vascular;  Laterality: Right;   SHOULDER SURGERY Bilateral    left- injections intermittent now  Rotator cuff repair on both shoulders, 2 different surgeries   TONSILLECTOMY     TOTAL KNEE ARTHROPLASTY Left 12/14/2013   Procedure: LEFT TOTAL KNEE ARTHROPLASTY;  Surgeon: Ricky Melodi GAILS, MD;  Location: [facility] ORS;  Service: Orthopedics;  Laterality: Left;     ALLERGIES: Allergies  Allergen Reactions   Amoxicillin Rash    Has patient had a PCN reaction causing immediate rash, facial/tongue/throat swelling, SOB or lightheadedness with hypotension:unsure Has patient had a PCN reaction causing severe rash involving mucus membranes or skin necrosis:unsure Has patient had a PCN reaction that required hospitalization No Has patient had a PCN reaction occurring within the last 10 years: No If all of the above answers are NO, then may proceed with Cephalosporin use.     FAMILY HISTORY: Family History  Problem Relation Age of Onset   Heart disease Father        in 36s- specifics unclear   Other Mother        passed in late 66s    SOCIAL HISTORY: Social History   Tobacco Use   Smoking status: Never  Passive exposure: Never   Smokeless tobacco: Never  Vaping Use   Vaping status: Never Used  Substance Use Topics   Alcohol use: No    Comment: former drinker   Drug use: No   Social History   Social History Narrative   Married. 1 son. 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       Hobbies: gym daily- riding exercise bike plus weights/machines, photography- used to do a lot of black and white, gardening   Left handed   Drinks coffee and caffeine   Two level home   Are you right handed or left handed? Left hand   Are you currently employed ?    What is your current occupation?retired   Who lives with you?    Caffiene 1 -2 cup dailyu         OBJECTIVE: PHYSICAL EXAM: BP (!) 178/100   Pulse 67   Ht 5' 5 (1.651 m)   Wt 139 lb (63 kg)   SpO2 99%   BMI 23.13 kg/m   General: General appearance: Awake and alert. No distress. Cooperative with exam.  Skin: No obvious rash or jaundice. HEENT: Atraumatic. Anicteric. Lungs: Non-labored breathing on room air  Heart: Regular Extremities: No edema. Arthritic changes in hands and feet. Psych: Affect  appropriate.  Neurological: Mental Status: Alert. Speech fluent. No pseudobulbar affect Cranial Nerves: CNII: No RAPD. Visual fields grossly intact. CNIII, IV, VI: PERRL. No nystagmus. EOMI. CN V: Facial sensation intact bilaterally to fine touch. CN VII: Facial muscles symmetric and strong. No ptosis at rest.. CN VIII: Hearing grossly intact bilaterally. CN IX: No hypophonia. CN X: Palate elevates symmetrically. CN XI: Full strength shoulder shrug bilaterally. CN XII: Tongue protrusion full and midline. No atrophy or fasciculations. No significant dysarthria Motor: Tone is normal except ?increased in RUE vs poor relaxation. Atrophy of bilateral shoulder girdle. No clear scapular winging.  Individual muscle group testing (MRC grade out of 5):  Movement     Neck flexion 5    Neck extension 5     Right Left   Shoulder abduction 4 4   Shoulder adduction 5 5   Shoulder ext rotation 3 3   Shoulder int rotation 5- 4+   Elbow flexion 5 5   Elbow extension 5 5   Wrist extension 5 5   Wrist flexion 5 5   Finger abduction - FDI 5 5   Finger abduction - ADM 5 5   Finger extension 5 5   Finger distal flexion - 2/3 5 5    Finger distal flexion - 4/5 5 5    Thumb flexion - FPL 5 5   Thumb abduction - APB 5- 5-    Hip flexion 5 5   Hip extension 5 5   Hip adduction 5 5   Hip abduction 5 5   Knee extension 5 5   Knee flexion 5 5   Dorsiflexion 5 5   Plantarflexion 5 5    Reflexes:  Right Left   Bicep 2+ 2+   Tricep 2+ 2+   BrRad 2+ 2+   Knee 2-3+ 2+   Ankle 1+ 1+    Pathological Reflexes: Babinski: flexor response bilaterally Hoffman: absent bilaterally Troemner: absent bilaterally Sensation: Pinprick: Intact except diminished in left forearm and hand Vibration: Intact in all extremities Coordination: Intact finger-to- nose-finger bilaterally. Romberg negative. Gait: Able to rise from chair with arms crossed unassisted.  Stooped posture. Narrow based, steady gait. No  freezing, normal turns.  Lab and Test Review: Internal labs: 03/11/23: CMP unremarkable CBC w/ diff significant for MCV 96.2 HbA1c: 5.9 Lipid panel: tChol 108, LDL 35, TG 40.0  01/25/23: TSH wnl B12: 421  Imaging/Procedures: MRI lumbar spine wo contrast (05/19/21): IMPRESSION: 1. In correlating with the prior lumbar spine radiographs of 02/09/2021, ribs may be hypoplastic or absent at the T12 level. Careful attention to spinal numbering is recommended prior to any surgical intervention. 2. Lumbar spondylosis, as outlined and with findings most notably as follows. 3. At L4-L5, there is 7 mm grade 1 anterolisthesis. Disc uncovering with disc bulge. Advanced facet arthrosis with ligamentum flavum hypertrophy. Severe right subarticular and central canal stenosis. Moderate left subarticular stenosis. Bilateral neural foraminal narrowing (severe right, moderate left). 4. No more than mild spinal canal narrowing at the remaining levels. 5. Additional sites of foraminal stenosis, as detailed and greatest bilaterally at L2-L3 (mild-to-moderate) and L3-L4 (moderate). 6. Disc degeneration is greatest at L1-L2 (moderate to moderately advanced), L2-L3 (advanced), L3-L4 (moderate to moderately advanced) and L4-L5 (advanced). Mild degenerative endplate edema at O7-O6, L3-L4 and L4-L5. 7. Lumbar dextrocurvature. 8. Mild grade 1 retrolisthesis at L1-L2, L2-L3 and L3-L4. 9. Suspected cholecystolithiasis.  MRI brain w/wo contrast (01/28/23): FINDINGS: Brain:   Mild generalized cerebral volume loss.   Known chronic right MCA territory cortical/subcortical infarct involving portions of the mid and posterior frontal lobe/insula, parietal lobe and posterosuperior temporal lobe. This infarct spans 4.5 cm and there is chronic hemosiderin deposition at this site.   Background mild multifocal T2 FLAIR hyperintense signal abnormality within the cerebral white matter, nonspecific but compatible  with chronic small vessel ischemic disease.   There is no acute infarct.   No evidence of an intracranial mass.   No extra-axial fluid collection.   No midline shift.   No pathologic intracranial enhancement identified.   Vascular: Maintained flow voids within the proximal large arterial vessels.   Skull and upper cervical spine: No focal worrisome marrow lesion. Incompletely assessed cervical spondylosis. Facet ankylosis bilaterally at C2-C3 and C3-C4.   Sinuses/Orbits: No mass or acute finding within the imaged orbits. Prior bilateral ocular lens replacement. Small mucous retention cyst within the left maxillary sinus.   Other: 15 mm T2 hypointense lesion within the parietal scalp at midline, nonspecific but likely reflecting a pilar cyst.   IMPRESSION: 1.  No evidence of an acute intracranial abnormality. 2. Known chronic cortical/subcortical right MCA territory infarct as described. 3. Background mild cerebral white matter chronic small vessel ischemic disease, progressed from the prior MRI of 02/28/2016. 4. Mild generalized cerebral atrophy. 5. Small left maxillary sinus mucous retention cyst.  Carotid ultrasound (03/07/23): Summary:  Right Carotid: Velocities in the right ICA are consistent with a 1-39%  stenosis.   Left Carotid: Velocities in the left ICA are consistent with a 1-39%  stenosis.   Vertebrals: Bilateral vertebral arteries demonstrate antegrade flow.   CT head and cervical spine wo contrast (10/27/23): IMPRESSION: 1. No acute intracranial abnormality. 2. No acute fracture or traumatic malalignment of the cervical spine.  Right shoulder xray (10/27/23): FINDINGS: There is no acute fracture or dislocation. There is marked subacromial joint space narrowing. Postsurgical changes are seen in the humeral head. There severe degenerative changes of the acromioclavicular joint with osteophyte formation and adjacent soft tissue calcifications.    IMPRESSION: 1. No acute fracture or dislocation. 2. Severe degenerative changes of the acromioclavicular joint. 3. Findings compatible with chronic rotator  cuff tendinopathy.  ASSESSMENT: Ricky [name]. is a 78 y.o. male who presents for evaluation of weakness of arms. He has a relevant medical history of stroke in right MCA territory (03/01/16), pre-DM, COPD, HTN, HLD DDD of lumbar spine c/b chronic low back pain, OSA, and bilateral rotator cuff surgery. His neurological examination is pertinent for weakness of bilateral shoulder girdle (left > right). Available diagnostic data is significant for xray of right shoulder showing severe degenerative changes of the right AC joint and chronic rotator cuff tendinopathy. This could be the cause of patient's bilateral shoulder weakness (rotator cuff related). Myopathy is also possible. He has no muscle pains and his proximal legs are strong, so I'm not sure. I will get labs to start and reach out to ortho. I may do EMG to be sure, if needed.   PLAN: -Blood work: CK, aldolase, B1, B12, folate -I have reached out to patient's current orthopedist Dr. Claudene about evaluation of rotator cuff for potential help evaluating this -Will consider EMG for potential myopathy  -Patient's BP was very elevated during clinic visit today. He was advised to discuss with PCP ASAP.  -Return to clinic to be determined  The impression above as well as the plan as outlined below were extensively discussed with the patient (in the company of wife) who voiced understanding. All questions were answered to their satisfaction.  The patient was counseled on pertinent fall precautions per the printed material provided today, and as noted under the Patient Instructions section below.  When available, results of the above investigations and possible further recommendations will be communicated to the patient via telephone/MyChart. Patient to call office if not contacted after  expected testing turnaround time.   Total time spent reviewing records, interview, history/exam, documentation, and coordination of care on day of encounter:  70 min   Thank you for allowing me to participate in patient's care.  If I can answer any additional questions, I would be pleased to do so.  Venetia Potters, MD   CC: Ricky Garnette KIDD, MD [address] [place] [address]  CC: Referring provider: Katrinka Garnette KIDD, MD [address] [place],  [place] [address]

## 2023-12-12 DIAGNOSIS — D1801 Hemangioma of skin and subcutaneous tissue: Secondary | ICD-10-CM | POA: Diagnosis not present

## 2023-12-12 DIAGNOSIS — L821 Other seborrheic keratosis: Secondary | ICD-10-CM | POA: Diagnosis not present

## 2023-12-12 DIAGNOSIS — L570 Actinic keratosis: Secondary | ICD-10-CM | POA: Diagnosis not present

## 2023-12-12 DIAGNOSIS — L814 Other melanin hyperpigmentation: Secondary | ICD-10-CM | POA: Diagnosis not present

## 2023-12-12 DIAGNOSIS — D492 Neoplasm of unspecified behavior of bone, soft tissue, and skin: Secondary | ICD-10-CM | POA: Diagnosis not present

## 2023-12-15 ENCOUNTER — Encounter: Payer: Self-pay | Admitting: Family Medicine

## 2023-12-16 ENCOUNTER — Other Ambulatory Visit: Payer: Self-pay

## 2023-12-16 MED ORDER — AMLODIPINE BESYLATE 2.5 MG PO TABS: 2.5000 mg | ORAL_TABLET | Freq: Every day | ORAL | 1 refills | Status: DC

## 2023-12-17 ENCOUNTER — Other Ambulatory Visit: Payer: Self-pay | Admitting: Family Medicine

## 2023-12-18 ENCOUNTER — Ambulatory Visit (INDEPENDENT_AMBULATORY_CARE_PROVIDER_SITE_OTHER): Admitting: Neurology

## 2023-12-18 ENCOUNTER — Other Ambulatory Visit

## 2023-12-18 ENCOUNTER — Encounter: Payer: Self-pay | Admitting: Neurology

## 2023-12-18 VITALS — BP 178/100 | HR 67 | Ht 65.0 in | Wt 139.0 lb

## 2023-12-18 DIAGNOSIS — M48061 Spinal stenosis, lumbar region without neurogenic claudication: Secondary | ICD-10-CM

## 2023-12-18 DIAGNOSIS — R29898 Other symptoms and signs involving the musculoskeletal system: Secondary | ICD-10-CM | POA: Diagnosis not present

## 2023-12-18 DIAGNOSIS — G894 Chronic pain syndrome: Secondary | ICD-10-CM

## 2023-12-18 DIAGNOSIS — R4189 Other symptoms and signs involving cognitive functions and awareness: Secondary | ICD-10-CM | POA: Diagnosis not present

## 2023-12-18 DIAGNOSIS — I69359 Hemiplegia and hemiparesis following cerebral infarction affecting unspecified side: Secondary | ICD-10-CM | POA: Diagnosis not present

## 2023-12-18 NOTE — Addendum Note (Signed)
 Addended by: LEIGH DITCH L on: 12/18/2023 11:15 AM   Modules accepted: Level of Service

## 2023-12-18 NOTE — Patient Instructions (Addendum)
 I saw you today for weakness, particularly of your shoulders. This could be a muscle problem or related to your prior rotator cuff issues.  I would like to check some labs today.  I will also reach out to Dr. Claudene to discuss your rotator cuff and get his opinion.  We may do nerve and muscle testing, so I will include information about that, but I'm not sure if we will do that for sure or not.  I will be in touch when I have your lab results to discuss next steps.  I encourage you to get and start using CPAP as this could help your cognition. Follow up with Camie Sevin as planned to further discuss cognition.  Please let me know if you have any questions or concerns in the meantime.   Please reach out to your primary care doctor as your blood pressure is very elevated today.  The physicians and staff at [facility] are committed to providing excellent care. You may receive a survey requesting feedback about your experience at our office. We strive to receive very good responses to the survey questions. If you feel that your experience would prevent you from giving the office a very good  response, please contact our office to try to remedy the situation. We may be reached at [phone]. Thank you for taking the time out of your busy day to complete the survey.  Venetia Potters, MD [facility]  ELECTROMYOGRAM AND NERVE CONDUCTION STUDIES (EMG/NCS) INSTRUCTIONS  How to Prepare The neurologist conducting the EMG will need to know if you have certain medical conditions. Tell the neurologist and other EMG lab personnel if you: Have a pacemaker or any other electrical medical device Take blood-thinning medications Have hemophilia, a blood-clotting disorder that causes prolonged bleeding Bathing Take a shower or bath shortly before your exam in order to remove oils from your skin. Don't apply lotions or creams before the exam.  What to Expect You'll likely be asked to change  into a hospital gown for the procedure and lie down on an examination table. The following explanations can help you understand what will happen during the exam.  Electrodes. The neurologist or a technician places surface electrodes at various locations on your skin depending on where you're experiencing symptoms. Or the neurologist may insert needle electrodes at different sites depending on your symptoms.  Sensations. The electrodes will at times transmit a tiny electrical current that you may feel as a twinge or spasm. The needle electrode may cause discomfort or pain that usually ends shortly after the needle is removed. If you are concerned about discomfort or pain, you may want to talk to the neurologist about taking a short break during the exam.  Instructions. During the needle EMG, the neurologist will assess whether there is any spontaneous electrical activity when the muscle is at rest - activity that isn't present in healthy muscle tissue - and the degree of activity when you slightly contract the muscle.  He or she will give you instructions on resting and contracting a muscle at appropriate times. Depending on what muscles and nerves the neurologist is examining, he or she may ask you to change positions during the exam.  After your EMG You may experience some temporary, minor bruising where the needle electrode was inserted into your muscle. This bruising should fade within several days. If it persists, contact your primary care doctor.    Preventing Falls at [facility] are common, often dreaded events  in the lives of older people. Aside from the obvious injuries and even death that may result, fall can cause wide-ranging consequences including loss of independence, mental decline, decreased activity and mobility. Younger people are also at risk of falling, especially those with chronic illnesses and fatigue.  Ways to reduce risk for falling Examine diet and medications. Warm foods and  alcohol dilate blood vessels, which can lead to dizziness when standing. Sleep aids, antidepressants and pain medications can also increase the likelihood of a fall.  Get a vision exam. Poor vision, cataracts and glaucoma increase the chances of falling.  Check foot gear. Shoes should fit snugly and have a sturdy, nonskid sole and a broad, low heel  Participate in a physician-approved exercise program to build and maintain muscle strength and improve balance and coordination. Programs that use ankle weights or stretch bands are excellent for muscle-strengthening. Water aerobics programs and low-impact Tai Chi programs have also been shown to improve balance and coordination.  Increase vitamin D intake. Vitamin D improves muscle strength and increases the amount of calcium  the body is able to absorb and deposit in bones.  How to prevent falls from common hazards Floors - Remove all loose wires, cords, and throw rugs. Minimize clutter. Make sure rugs are anchored and smooth. Keep furniture in its usual place.  Chairs -- Use chairs with straight backs, armrests and firm seats. Add firm cushions to existing pieces to add height.  Bathroom - Install grab bars and non-skid tape in the tub or shower. Use a bathtub transfer bench or a shower chair with a back support Use an elevated toilet seat and/or safety rails to assist standing from a low surface. Do not use towel racks or bathroom tissue holders to help you stand.  Lighting - Make sure halls, stairways, and entrances are well-lit. Install a night light in your bathroom or hallway. Make sure there is a light switch at the top and bottom of the staircase. Turn lights on if you get up in the middle of the night. Make sure lamps or light switches are within reach of the bed if you have to get up during the night.  Kitchen - Install non-skid rubber mats near the sink and stove. Clean spills immediately. Store frequently used utensils, pots, pans between  waist and eye level. This helps prevent reaching and bending. Sit when getting things out of lower cupboards.  Living room/ Bedrooms - Place furniture with wide spaces in between, giving enough room to move around. Establish a route through the living room that gives you something to hold onto as you walk.  Stairs - Make sure treads, rails, and rugs are secure. Install a rail on both sides of the stairs. If stairs are a threat, it might be helpful to arrange most of your activities on the lower level to reduce the number of times you must climb the stairs.  Entrances and doorways - Install metal handles on the walls adjacent to the doorknobs of all doors to make it more secure as you travel through the doorway.  Tips for maintaining balance Keep at least one hand free at all times. Try using a backpack or fanny pack to hold things rather than carrying them in your hands. Never carry objects in both hands when walking as this interferes with keeping your balance.  Attempt to swing both arms from front to back while walking. This might require a conscious effort if Parkinson's disease has diminished your movement. It will,  however, help you to maintain balance and posture, and reduce fatigue.  Consciously lift your feet off of the ground when walking. Shuffling and dragging of the feet is a common culprit in losing your balance.  When trying to navigate turns, use a U technique of facing forward and making a wide turn, rather than pivoting sharply.  Try to stand with your feet shoulder-length apart. When your feet are close together for any length of time, you increase your risk of losing your balance and falling.  Do one thing at a time. Don't try to walk and accomplish another task, such as reading or looking around. The decrease in your automatic reflexes complicates motor function, so the less distraction, the better.  Do not wear rubber or gripping soled shoes, they might catch on the  floor and cause tripping.  Move slowly when changing positions. Use deliberate, concentrated movements and, if needed, use a grab bar or walking aid. Count 15 seconds between each movement. For example, when rising from a seated position, wait 15 seconds after standing to begin walking.  If balance is a continuous problem, you might want to consider a walking aid such as a cane, walking stick, or walker. Once you've mastered walking with help, you might be ready to try it on your own again.

## 2023-12-19 ENCOUNTER — Encounter: Payer: Self-pay | Admitting: Family Medicine

## 2023-12-22 LAB — CK: Total CK: 189 U/L (ref 17–247)

## 2023-12-22 LAB — VITAMIN B1: Vitamin B1 (Thiamine): 10 nmol/L (ref 8–30)

## 2023-12-22 LAB — VITAMIN B12: Vitamin B-12: 525 pg/mL (ref 200–1100)

## 2023-12-22 LAB — FOLATE: Folate: 14.7 ng/mL

## 2023-12-22 LAB — ALDOLASE: Aldolase: 4.1 U/L (ref <=8.1)

## 2023-12-23 ENCOUNTER — Telehealth: Payer: Self-pay | Admitting: Neurology

## 2023-12-23 NOTE — Telephone Encounter (Signed)
 Attempted to call patient to discuss lab results and next steps.   His labs were normal. His symptoms could be due to prior rotator cuff issues, but there is also a possibility that there is a muscle or nerve issue (myopathy vs C5 radiculopathy) as well. He is going to be seeing Dr. Claudene who will evaluate the rotator cuff, but I was also planning to discuss the possibility of EMG as well.  I left a detailed message and asked for call back.  Venetia Potters, MD [facility]

## 2023-12-24 ENCOUNTER — Ambulatory Visit: Payer: Self-pay | Admitting: Neurology

## 2023-12-25 ENCOUNTER — Encounter: Payer: Self-pay | Admitting: Internal Medicine

## 2023-12-26 ENCOUNTER — Encounter: Payer: Self-pay | Admitting: Neurology

## 2023-12-26 ENCOUNTER — Encounter: Payer: Self-pay | Admitting: Family Medicine

## 2023-12-26 ENCOUNTER — Telehealth: Payer: Self-pay | Admitting: Neurology

## 2023-12-26 ENCOUNTER — Other Ambulatory Visit: Payer: Self-pay | Admitting: Family Medicine

## 2023-12-26 NOTE — Telephone Encounter (Signed)
 Rook called in stating he is returning a call from Dr. Leigh.    PH: [phone]

## 2023-12-26 NOTE — Telephone Encounter (Signed)
 Attempted to return patient's call. Left message asking him to respond to MyChart or return call to office.  Venetia Potters, MD [facility]

## 2023-12-27 ENCOUNTER — Other Ambulatory Visit: Payer: Self-pay

## 2023-12-27 ENCOUNTER — Other Ambulatory Visit: Payer: Self-pay | Admitting: Family Medicine

## 2023-12-27 MED ORDER — AMLODIPINE BESYLATE 5 MG PO TABS: 5.0000 mg | ORAL_TABLET | Freq: Every day | ORAL | 3 refills | Status: AC

## 2023-12-27 NOTE — Telephone Encounter (Signed)
 Dr. Neysa, Please see patient message regarding CPAP machine/training and advise.  Thank you.

## 2023-12-27 NOTE — Telephone Encounter (Signed)
 I suggest you call the DME company, explain what happened, and ask to reschedule the visit.

## 2023-12-27 NOTE — Telephone Encounter (Signed)
 New script for 5mg  sent to pharmacy. 2.5 removed from med list.

## 2023-12-30 ENCOUNTER — Encounter: Payer: Self-pay | Admitting: Family Medicine

## 2024-01-06 ENCOUNTER — Telehealth: Payer: Self-pay | Admitting: Neurology

## 2024-01-06 NOTE — Telephone Encounter (Signed)
 Called patient to discuss work up. He has bilateral shoulder weakness that could be due to known rotator cuff issues (he will be seeing Dr. Claudene to have this evaluation on 01/14/24) and/or a muscle or nerve issue. We discussed EMG to evaluate muscle or nerve issues. Patient would like to wait and see what Dr. Claudene says at the upcoming appointment then make a decision about EMG.  All questions were answered.  Venetia Potters, MD [facility]

## 2024-01-08 DIAGNOSIS — M5137 Other intervertebral disc degeneration, lumbosacral region with discogenic back pain only: Secondary | ICD-10-CM | POA: Diagnosis not present

## 2024-01-09 NOTE — Progress Notes (Signed)
 [name] [address] [place] [address] Phone: [phone] Subjective:   LILLETTE [name], am serving as a scribe for Dr. Arthea Claudene.  I'm seeing this patient by the request  of:  Katrinka Garnette KIDD, MD  CC: low back pain follow up   YEP:Dlagzrupcz  10/15/2023 Unfortunately patient does have pain noted of this area and multiple other joints.  We have discussed with patient in great length about icing regimen and home exercises, patient continues to try to be active but has been somewhat noncompliant with different treatment options.  We discussed with patient that I do feel formal physical therapy will be the most beneficial for this individual.  Discussed with patient we will also different medications.  Patient wants to avoid too many of them.  We also discussed the possibility of a spinal stimulator.  Discuss his Celebrex  which has been prescribed for him previously but will do 100 mg twice a day as needed and warned of potential side effects.  Patient wants to try it and see how he responds.  Told him to do it in 5-day burst.  Follow-up with me again in 3 months.  Total time reviewing patient's chart and discussing with patient 32 minutes      Update 01/14/2024 [name]. is a 78 y.o. male coming in with complaint of lumbar spine pain. Patient states that his back bothers him with movement. No pain at rest. Pain is relieved with a lumbar flexed position. Does not feel like Celebrex  was helpful. Wants to know if he should take a gabapentin  at lunch. He only does BID but the prescription recommends TID. Has been doing pool walking.   Patient was to do the Celebrex  for 5-day burst twice a day and 200 mg. Feels like Tylenol  is more helpful. Takes 1000mg .   Patient did see a neurologist and they did discuss the possibility of EMG. Patient thought he was going to find out explanation for brain fog. Also wants to understand what EMG.     Past  Medical History:  Diagnosis Date   Arthritis    Carotid artery occlusion    Complication of anesthesia    slow to wake up after shoulder surgeries   Hyperlipidemia 03/08/2016   LDL 75 pre stroke. Goal <70 so on atorvastatin  20mg  every other day   Hypertension    Peripheral vascular disease    Pneumonia    Stroke [facility])    still has decreased sensation in left hand and arm (as of 03/09/16)   Past Surgical History:  Procedure Laterality Date   CAROTID ENDARTERECTOMY     CATARACT EXTRACTION W/ INTRAOCULAR LENS IMPLANT Bilateral 2024   ENDARTERECTOMY Right 03/19/2016   Procedure: ENDARTERECTOMY CAROTID;  Surgeon: Carlin FORBES Haddock, MD;  Location: [facility] OR;  Service: Vascular;  Laterality: Right;   PATCH ANGIOPLASTY Right 03/19/2016   Procedure: PATCH ANGIOPLASTY OF RIGHT CAROTID USING HEMASHIELD PLATINUM FINESSE;  Surgeon: Carlin FORBES Haddock, MD;  Location: [facility] OR;  Service: Vascular;  Laterality: Right;   SHOULDER SURGERY Bilateral    left- injections intermittent now  Rotator cuff repair on both shoulders, 2 different surgeries   TONSILLECTOMY     TOTAL KNEE ARTHROPLASTY Left 12/14/2013   Procedure: LEFT TOTAL KNEE ARTHROPLASTY;  Surgeon: Dempsey Melodi GAILS, MD;  Location: [facility] ORS;  Service: Orthopedics;  Laterality: Left;   Social History   Socioeconomic History   Marital status: Married    Spouse name: Not on  file   Number of children: Not on file   Years of education: Not on file   Highest education level: Bachelor's degree (e.g., BA, AB, BS)  Occupational History   Occupation: Retired  Tobacco Use   Smoking status: Never    Passive exposure: Never   Smokeless tobacco: Never  Vaping Use   Vaping status: Never Used  Substance and Sexual Activity   Alcohol use: No    Comment: former drinker   Drug use: No   Sexual activity: Yes  Other Topics Concern   Not on file  Social History Narrative   Married. 1 son. 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       Hobbies: gym daily- riding exercise bike plus weights/machines, photography- used to do a lot of black and white, gardening   Left handed   Drinks coffee and caffeine   Two level home   Are you right handed or left handed? Left hand   Are you currently employed ?    What is your current occupation?retired   Who lives with you?    Caffiene 1 -2 cup dailyu       Social Drivers of Health   Financial Resource Strain: Low Risk  (07/20/2023)   Overall Financial Resource Strain (CARDIA)    Difficulty of Paying Living Expenses: Not hard at all  Food Insecurity: No Food Insecurity (07/20/2023)   Hunger Vital Sign    Worried About Running Out of Food in the Last Year: Never true    Ran Out of Food in the Last Year: Never true  Transportation Needs: No Transportation Needs (07/20/2023)   PRAPARE - Administrator, Civil Service (Medical): No    Lack of Transportation (Non-Medical): No  Physical Activity: Insufficiently Active (07/20/2023)   Exercise Vital Sign    Days of Exercise per Week: 2 days    Minutes of Exercise per Session: 40 min  Stress: No Stress Concern Present (07/20/2023)   [employer] of Occupational Health - Occupational Stress Questionnaire    Feeling of Stress: Only a little  Recent Concern: Stress - Stress Concern Present (06/25/2023)   [employer] of Occupational Health - Occupational Stress Questionnaire    Feeling of Stress : To some extent  Social Connections: Unknown (07/20/2023)   Social Connection and Isolation Panel    Frequency of Communication with Friends and Family: Twice a week    Frequency of Social Gatherings with Friends and Family: Once a week    Attends Religious Services: Never    Diplomatic Services Operational Officer: Not on file    Attends Banker Meetings: Not on file    Marital Status: Married  Recent Concern: Social Connections - Moderately Isolated (06/25/2023)   Social  Connection and Isolation Panel    Frequency of Communication with Friends and Family: Once a week    Frequency of Social Gatherings with Friends and Family: Once a week    Attends Religious Services: Never    Database Administrator or Organizations: Yes    Attends Engineer, Structural: 1 to 4 times per year    Marital Status: Married   Allergies  Allergen Reactions   Amoxicillin Rash    Has patient had a PCN reaction causing immediate rash, facial/tongue/throat swelling, SOB or lightheadedness with hypotension:unsure Has patient had a PCN reaction causing severe rash involving mucus membranes or skin necrosis:unsure  Has patient had a PCN reaction that required hospitalization No Has patient had a PCN reaction occurring within the last 10 years: No If all of the above answers are NO, then may proceed with Cephalosporin use.    Family History  Problem Relation Age of Onset   Heart disease Father        in 48s- specifics unclear   Other Mother        passed in late 71s     Current Outpatient Medications (Cardiovascular):    amLODipine  (NORVASC ) 5 MG tablet, Take 1 tablet (5 mg total) by mouth daily.   rosuvastatin  (CRESTOR ) 5 MG tablet, TAKE 1 TABLET (5 MG TOTAL) BY MOUTH DAILY.  Current Outpatient Medications (Respiratory):    albuterol  (VENTOLIN  HFA) 108 (90 Base) MCG/ACT inhaler, Inhale 2 puffs into the lungs every 6 (six) hours as needed for wheezing or shortness of breath.  Current Outpatient Medications (Analgesics):    acetaminophen  (TYLENOL ) 500 MG tablet, Take 500-1,000 mg by mouth every 6 (six) hours as needed (for pain.).    aspirin  EC 81 MG tablet, Take 81 mg by mouth daily. Swallow whole.   Current Outpatient Medications (Other):    gabapentin  (NEURONTIN ) 300 MG capsule, Take 1 capsule (300 mg total) by mouth 3 (three) times daily. (Patient taking differently: Take 300 mg by mouth once.)   Multiple Vitamin (MULTIVITAMIN WITH MINERALS) TABS tablet, Take 1  tablet by mouth daily.   Reviewed prior external information including notes and imaging from  primary care provider As well as notes that were available from care everywhere and other healthcare systems.  Past medical history, social, surgical and family history all reviewed in electronic medical record.  No pertanent information unless stated regarding to the chief complaint.   Review of Systems:  No headache, visual changes, nausea, vomiting, diarrhea, constipation, dizziness, abdominal pain, skin rash, fevers, chills, night sweats, weight loss, swollen lymph nodes, body aches, joint swelling, chest pain, shortness of breath, mood changes. POSITIVE muscle aches  Objective  Height 5' 5 (1.651 m), weight 138 lb (62.6 kg).   General: No apparent distress alert and oriented x3 mood and affect normal, dressed appropriately.  HEENT: Pupils equal, extraocular movements intact  Respiratory: Patient's speak in full sentences and does not appear short of breath  Cardiovascular: No lower extremity edema, non tender, no erythema  Significant arthritic changes noted on multiple different joints.  Fairly sitting comfortably.  Does have some difficulty going from seated to standing position.  Low back does have some loss of lordosis, some degenerative scoliosis noted.  Some weakness noted in the gluteal area. Weakness noted of the shoulders bilaterally.  Seems to have some arthritic changes noted as well.   Impression and Recommendations:    The above documentation has been reviewed and is accurate and complete Darek Eifler M Angelea Penny, DO

## 2024-01-13 DIAGNOSIS — Z9849 Cataract extraction status, unspecified eye: Secondary | ICD-10-CM | POA: Diagnosis not present

## 2024-01-13 DIAGNOSIS — H524 Presbyopia: Secondary | ICD-10-CM | POA: Diagnosis not present

## 2024-01-13 DIAGNOSIS — Z961 Presence of intraocular lens: Secondary | ICD-10-CM | POA: Diagnosis not present

## 2024-01-13 DIAGNOSIS — H52223 Regular astigmatism, bilateral: Secondary | ICD-10-CM | POA: Diagnosis not present

## 2024-01-13 DIAGNOSIS — H5203 Hypermetropia, bilateral: Secondary | ICD-10-CM | POA: Diagnosis not present

## 2024-01-13 DIAGNOSIS — H53143 Visual discomfort, bilateral: Secondary | ICD-10-CM | POA: Diagnosis not present

## 2024-01-14 ENCOUNTER — Ambulatory Visit: Admitting: Family Medicine

## 2024-01-14 VITALS — Ht 65.0 in | Wt 138.0 lb

## 2024-01-14 DIAGNOSIS — G8929 Other chronic pain: Secondary | ICD-10-CM

## 2024-01-14 DIAGNOSIS — M25512 Pain in left shoulder: Secondary | ICD-10-CM | POA: Diagnosis not present

## 2024-01-14 DIAGNOSIS — M79662 Pain in left lower leg: Secondary | ICD-10-CM | POA: Diagnosis not present

## 2024-01-14 DIAGNOSIS — M25511 Pain in right shoulder: Secondary | ICD-10-CM | POA: Insufficient documentation

## 2024-01-14 DIAGNOSIS — M48061 Spinal stenosis, lumbar region without neurogenic claudication: Secondary | ICD-10-CM

## 2024-01-14 DIAGNOSIS — M79661 Pain in right lower leg: Secondary | ICD-10-CM | POA: Diagnosis not present

## 2024-01-14 NOTE — Assessment & Plan Note (Signed)
 Known severe arthritic changes noted in the lower back.  Will continue to monitor.  Holding on any injection.

## 2024-01-14 NOTE — Patient Instructions (Signed)
 Take medication regularly Referral to neurology See me in 2-3 months for injections

## 2024-01-14 NOTE — Assessment & Plan Note (Signed)
 Likely rotator cuff arthropathy but could also be peripheral neuropathy or even the potential of a neuromuscular disorder.  Patient has seen neurology and are discussing which order was placed.  I do think it would be beneficial.  If normal then would highly recommend injections in the shoulder for further evaluation.  Patient likely does have some rotator cuff arthropathy secondary to some of the weakness and arthritic changes patient has had in multiple different areas as well.  Has been doing physical therapy.  Encouraged to stay active otherwise.  I personally spent a total of 33 minutes in the care of the patient today including preparing to see the patient, getting/reviewing separately obtained history, performing a medically appropriate exam/evaluation, referring and communicating with other health care professionals, documenting clinical information in the EHR, independently interpreting results, communicating results, and coordinating care.

## 2024-01-16 ENCOUNTER — Other Ambulatory Visit: Payer: Self-pay | Admitting: Family Medicine

## 2024-01-21 ENCOUNTER — Encounter: Payer: Self-pay | Admitting: Family Medicine

## 2024-01-21 ENCOUNTER — Ambulatory Visit: Payer: Self-pay | Admitting: Family Medicine

## 2024-01-21 ENCOUNTER — Ambulatory Visit (INDEPENDENT_AMBULATORY_CARE_PROVIDER_SITE_OTHER): Admitting: Family Medicine

## 2024-01-21 VITALS — BP 128/68 | Temp 98.2°F | Ht 65.0 in | Wt 140.4 lb

## 2024-01-21 DIAGNOSIS — Z131 Encounter for screening for diabetes mellitus: Secondary | ICD-10-CM

## 2024-01-21 DIAGNOSIS — E785 Hyperlipidemia, unspecified: Secondary | ICD-10-CM

## 2024-01-21 DIAGNOSIS — G894 Chronic pain syndrome: Secondary | ICD-10-CM

## 2024-01-21 DIAGNOSIS — I10 Essential (primary) hypertension: Secondary | ICD-10-CM | POA: Diagnosis not present

## 2024-01-21 DIAGNOSIS — R739 Hyperglycemia, unspecified: Secondary | ICD-10-CM

## 2024-01-21 LAB — COMPREHENSIVE METABOLIC PANEL WITH GFR
ALT: 17 U/L (ref 3–53)
AST: 26 U/L (ref 5–37)
Albumin: 4 g/dL (ref 3.5–5.2)
Alkaline Phosphatase: 62 U/L (ref 39–117)
BUN: 29 mg/dL — ABNORMAL HIGH (ref 6–23)
CO2: 27 meq/L (ref 19–32)
Calcium: 9.1 mg/dL (ref 8.4–10.5)
Chloride: 107 meq/L (ref 96–112)
Creatinine, Ser: 0.69 mg/dL (ref 0.40–1.50)
GFR: 87.62 mL/min (ref >60.00)
Glucose, Bld: 104 mg/dL — ABNORMAL HIGH (ref 70–99)
Potassium: 4.4 meq/L (ref 3.5–5.1)
Sodium: 140 meq/L (ref 135–145)
Total Bilirubin: 0.5 mg/dL (ref 0.2–1.2)
Total Protein: 6.5 g/dL (ref 6.0–8.3)

## 2024-01-21 LAB — CBC WITH DIFFERENTIAL/PLATELET
Basophils Absolute: 0 K/uL (ref 0.0–0.1)
Basophils Relative: 0.4 % (ref 0.0–3.0)
Eosinophils Absolute: 0.1 K/uL (ref 0.0–0.7)
Eosinophils Relative: 1.2 % (ref 0.0–5.0)
HCT: 38.6 % — ABNORMAL LOW (ref 39.0–52.0)
Hemoglobin: 12.9 g/dL — ABNORMAL LOW (ref 13.0–17.0)
Lymphocytes Relative: 19 % (ref 12.0–46.0)
Lymphs Abs: 0.9 K/uL (ref 0.7–4.0)
MCHC: 33.6 g/dL (ref 30.0–36.0)
MCV: 95.2 fl (ref 78.0–100.0)
Monocytes Absolute: 0.4 K/uL (ref 0.1–1.0)
Monocytes Relative: 8.6 % (ref 3.0–12.0)
Neutro Abs: 3.3 K/uL (ref 1.4–7.7)
Neutrophils Relative %: 70.8 % (ref 43.0–77.0)
Platelets: 220 K/uL (ref 150.0–400.0)
RBC: 4.05 Mil/uL — ABNORMAL LOW (ref 4.22–5.81)
RDW: 13.9 % (ref 11.5–15.5)
WBC: 4.7 K/uL (ref 4.0–10.5)

## 2024-01-21 LAB — HEMOGLOBIN A1C: Hgb A1c MFr Bld: 5.7 % (ref 4.6–6.5)

## 2024-01-21 NOTE — Progress Notes (Signed)
 Phone [phone] In person visit   Subjective:   Ricky [name]. is a 78 y.o. year old very pleasant male patient who presents for/with See problem oriented charting Chief Complaint  Patient presents with   Medical Management of Chronic Issues    3 month follow up; back pain getting worse depending on the day; pt still feeling out of it and that is also getting worse; still losing strength and muscle mass; saw neurologist and they did not address the fuzzy feeling in his brain he only addressed the nerve pain, has emg set up in january; completed at home sleep study and now has severe sleep apnea; has been checking bp at home;    Hypertension    Past Medical History-  Patient Active Problem List   Diagnosis Date Noted   Memory impairment 01/25/2023    Priority: High   DDD (degenerative disc disease), lumbosacral 10/24/2022    Priority: High   Chronic pain syndrome 10/22/2022    Priority: High   Hemiparesis affecting dominant side as late effect of stroke (HCC) 03/08/2016    Priority: High   History of CVA (cerebrovascular accident) 02/28/2016    Priority: High   Emphysema lung (HCC) 08/25/2021    Priority: Medium    Hyperglycemia 08/25/2021    Priority: Medium    Carotid artery stenosis, symptomatic (Right) 03/19/2016    Priority: Medium    Hyperlipidemia 03/08/2016    Priority: Medium    Essential hypertension 12/20/2014    Priority: Medium    Senile purpura 08/25/2021    Priority: Low   DDD (degenerative disc disease), lumbar 02/09/2021    Priority: Low   Acquired leg length discrepancy 02/09/2021    Priority: Low   Leukopenia 01/08/2017    Priority: Low   OA (osteoarthritis) of knee 12/14/2013    Priority: Low   Lumbar facet joint pain 11/18/2022    Priority: 1.   Spondylosis without myelopathy or radiculopathy, lumbosacral region 11/18/2022    Priority: 1.   Grade 1 Retrolisthesis of L1/L2, L2/L3, & L3/L4 10/25/2022    Priority: 1.   Lumbar facet  arthropathy (Multilevel) (Bilateral) 10/25/2022    Priority: 1.   Lumbar foraminal stenosis (Bilateral: L1-2, L2-3, L3-4, L4-5) (Right - SEVERE: L4-5) 10/25/2022    Priority: 1.   Lumbar lateral recess stenosis (Bilateral: L3-4, L4-5) (Right - SEVERE: L4-5) 10/25/2022    Priority: 1.   Ligamentum flavum hypertrophy (L3-4, L4-5, L5-S1) 10/25/2022    Priority: 1.   Chronic low back pain (1ry area of Pain) (Bilateral) w/o sciatica 10/25/2022    Priority: 1.   Grade 2 Anterolisthesis (7mm) of lumbar spine (L4/L5) 10/24/2022    Priority: 1.   Dextroscoliosis of lumbar spine 10/24/2022    Priority: 1.   Abnormal MRI, lumbar spine (05/19/2021) 10/24/2022    Priority: 1.   Spondylolisthesis of lumbar region 10/24/2022    Priority: 1.   Trigger middle finger of hand (Left) 11/12/2016    Priority: 1.   Bilateral shoulder pain 01/14/2024    Medications- reviewed and updated Current Outpatient Medications  Medication Sig Dispense Refill   acetaminophen  (TYLENOL ) 500 MG tablet Take 500-1,000 mg by mouth every 6 (six) hours as needed (for pain.).      albuterol  (VENTOLIN  HFA) 108 (90 Base) MCG/ACT inhaler Inhale 2 puffs into the lungs every 6 (six) hours as needed for wheezing or shortness of breath. 1 each 0   amLODipine  (NORVASC ) 5 MG tablet Take 1 tablet (5 mg total)  by mouth daily. 90 tablet 3   aspirin  EC 81 MG tablet Take 81 mg by mouth daily. Swallow whole.     celecoxib  (CELEBREX ) 200 MG capsule Take 200 mg by mouth 2 (two) times daily. Per Dr. Claudene     gabapentin  (NEURONTIN ) 300 MG capsule Take 1 capsule (300 mg total) by mouth 3 (three) times daily. (Patient taking differently: Take 300 mg by mouth once.) 90 capsule 3   Multiple Vitamin (MULTIVITAMIN WITH MINERALS) TABS tablet Take 1 tablet by mouth daily.     rosuvastatin  (CRESTOR ) 5 MG tablet TAKE 1 TABLET (5 MG TOTAL) BY MOUTH DAILY. 90 tablet 2   No current facility-administered medications for this visit.     Objective:  BP  128/68 (BP Location: Left Arm, Patient Position: Sitting, Cuff Size: Normal)   Temp 98.2 F (36.8 C) (Temporal)   Ht 5' 5 (1.651 m)   Wt 140 lb 6.4 oz (63.7 kg)   BMI 23.36 kg/m  Gen: NAD, resting comfortably CV: RRR no murmurs rubs or gallops Lungs: CTAB no crackles, wheeze, rhonchi Ext: trace edema on amlodipine  5 mg Skin: warm, dry     Assessment and Plan    # Bilateral shoulder pain and diffuse weakness particular in the arms-Dr. Claudene has referred back to neurology-he already sees them for memory loss and also had seen Dr. Leigh on 12/18/2023-who thought the weakness was primarily upper body-none noted in lower extremities.  They mention considering EMG and patient is now scheduled on January 5 for this.  CK, aldolase, B1, B12, folate reassuring -As far as his weakness he tries to be intentional about exercise and is exercising once a week.  The pool has been the most helpful thing but that is down right now which has been discouraging but he is hoping he will have access again soon -He started physical therapy once a week but needs to do a better job also doing exercises at home  # Ongoing chronic back pain due to degenerative disc disease-no significant changes-perhaps mildly worse.  He has gone up to gabapentin  3 times a day and is unsure if this is helpful.  He has been off of Celebrex  for a few days.  Sometimes he feels Tylenol  is the most effective thing.  I asked him to try to create some sort of record to see how he is doing each day and which medicines he is taking.  If he does not feel like the pain is worse off the Celebrex  we should simply continue off of this-and go check his renal function with ongoing prior use  # Severe sleep apnea-patient reports he was diagnosed with severe sleep apnea by Dr. Neysa and advised CPAP.  Patient got very nervous about the device set up during a meeting and wants to go back to his wife with him to help him.  It was very stressful for him. -  With his ongoing head fuzziness/cognitive concerns despite largely reassuring neurological workup we are hopeful he may have some at least mild benefit from treatment - Gabapentin  could contribute to some of his head fuzziness but also we are trying to balance that with pain control  #hypertension S: medication: amlodipine  7.5 mg--> 5 mg daily (blood pressure too high on 2.5 mg daily) Home readings #s: has home cuff - home #s were high on just 2.5 mg - recent readings from high 100s to 140's but most recnet check at home 130/82- average appears <135/85  Exercise-down to once  a wek with pool down Low-salt diet-tries to limit salt intake    BP Readings from Last 3 Encounters:  01/21/24 128/68  12/18/23 (!) 178/100  11/14/23 (!) 150/84  A/P: blood pressure much ipmroved- continue current medications    # history of CVA with hemiparesis affecting dominant side (left) as a result  #hyperlipidemia - LDL goal below 70 S: Medication:rouvastatin 5 mg ( prior Atorvastatin  20 mg every other day but stopped due to memory change), aspirin  81 mg   - occasionally drops objects with left hand (left handed)  still  Lab Results  Component Value Date   CHOL 108 03/11/2023   HDL 64.80 03/11/2023   LDLCALC 35 03/11/2023   LDLDIRECT 44.0 08/03/2019   TRIG 40.0 03/11/2023   CHOLHDL 2 03/11/2023  A/P: Lipids at goal even with CAD history-continue current medication-too soon for for repeat-continue current medication with rosuvastatin  5 mg daily.  Also continue aspirin  with stroke history - Increase as noted-continue to monitor  # Hyperglycemia/insulin resistance/prediabetes- peak a1c 103 08/03/19.  Peak A1c 5.9 February 17, 2020 S:  Medication: None.  Does have some postprandial fatigue Lab Results  Component Value Date   HGBA1C 5.9 03/11/2023   HGBA1C 5.9 08/01/2022   HGBA1C 5.9 02/26/2022   A/P: Update A1c-postprandial fatigue can be associated with hyperglycemia   #Mild leukopenia-intermittently  noted but not at last visit-continue to follow and check with labs today Lab Results  Component Value Date   WBC 4.2 03/11/2023   HGB 13.6 03/11/2023   HCT 40.8 03/11/2023   MCV 96.2 03/11/2023   PLT 225.0 03/11/2023   Recommended follow up: Return in about 3 months (around 04/20/2024) for followup or sooner if needed.Schedule b4 you leave. Future Appointments  Date Time Provider Department Center  02/10/2024  2:00 PM Leigh Venetia CROME, MD [facility] None  03/04/2024  2:30 PM Dina Camie BRAVO, PA-C [facility] None  03/24/2024 10:15 AM Claudene Arthea HERO, DO [facility] None  05/25/2024  8:30 AM Kdeiss, Bianca [facility] None  05/25/2024  9:30 AM [facility] NEUROPSYCH TECH2 [facility] None  06/01/2024 10:00 AM Kdeiss, Bianca [facility] None  06/09/2024  9:30 AM Dina Camie BRAVO, PA-C [facility] None  07/06/2024 10:00 AM [facility] ANNUAL WELLNESS VISIT 1 [facility] [place]    Lab/Order associations:   ICD-10-CM   1. Essential hypertension  I10     2. Hyperlipidemia, unspecified hyperlipidemia type  E78.5 CBC with Differential/Platelet    Comprehensive metabolic panel    3. Chronic pain syndrome  G89.4     4. Hyperglycemia  R73.9 HgB A1c    5. Screening for diabetes mellitus  Z13.1 HgB A1c      No orders of the defined types were placed in this encounter.   Return precautions advised.  Garnette Lukes, MD

## 2024-01-21 NOTE — Patient Instructions (Addendum)
 Please stop by lab before you go If you have mychart- we will send your results within 3 business days of us  receiving them.  If you do not have mychart- we will call you about results within 5 business days of us  receiving them.  *please also note that you will see labs on mychart as soon as they post. I will later go in and write notes on them- will say notes from Dr. Katrinka   No changes today unless labs lead us  to make changes  Swelling likely from amlodipine  but blood pressure better so continue 5 mg dose  I agree with EMG and trying to get the CPAP set up  Recommended follow up: Return in about 3 months (around 04/20/2024) for followup or sooner if needed.Schedule b4 you leave.

## 2024-01-25 ENCOUNTER — Other Ambulatory Visit: Payer: Self-pay | Admitting: Family Medicine

## 2024-01-27 ENCOUNTER — Other Ambulatory Visit: Payer: Self-pay

## 2024-01-27 DIAGNOSIS — R202 Paresthesia of skin: Secondary | ICD-10-CM

## 2024-02-03 ENCOUNTER — Ambulatory Visit: Admitting: Family

## 2024-02-03 ENCOUNTER — Encounter: Payer: Self-pay | Admitting: Family

## 2024-02-03 VITALS — BP 118/62 | HR 67 | Temp 97.9°F | Ht 65.0 in | Wt 139.2 lb

## 2024-02-03 DIAGNOSIS — S80212A Abrasion, left knee, initial encounter: Secondary | ICD-10-CM | POA: Diagnosis not present

## 2024-02-03 NOTE — Progress Notes (Signed)
 "  Patient ID: Ricky Pfiffner., male    DOB: January 23, 1946, 78 y.o.   MRN: [id number]  Chief Complaint  Patient presents with   Knee Pain    Pt c/o left knee pain after riding an exercise bike, Pt states pants were rubbing against knee. Present 1 week ago, has tried ointment OTC.   Discussed the use of AI scribe software for clinical note transcription with the patient, who gave verbal consent to proceed.  History of Present Illness Ricky Baby. is an 78 year old male who presents with a knee wound following exercise biking.  About a week ago, Ricky [name] developed an open knee wound from rubbing while exercise biking. It has been slow to heal and appears yellow. Ricky [name] and his wife have been applying topical antibiotic ointment. The wound is not painful or tender, though it was slightly raised at the edges initially. Ricky [name] is worried about further irritation from clothing and during sleep.  Assessment & Plan Abrasion of left knee Shallow abrasion healing normally, no infection signs, slow healing due to age. - Clean wound with soap and water. - Apply loose Band-Aid. - Avoid antibiotic ointment unless necessary - Avoid swimming until scab forms. - Wear loose clothing to prevent rubbing. - Call the office if sx are worsening.    Subjective:    Outpatient Medications Prior to Visit  Medication Sig Dispense Refill   acetaminophen  (TYLENOL ) 500 MG tablet Take 500-1,000 mg by mouth every 6 (six) hours as needed (for pain.).      albuterol  (VENTOLIN  HFA) 108 (90 Base) MCG/ACT inhaler Inhale 2 puffs into the lungs every 6 (six) hours as needed for wheezing or shortness of breath. 1 each 0   amLODipine  (NORVASC ) 5 MG tablet Take 1 tablet (5 mg total) by mouth daily. 90 tablet 3   aspirin  EC 81 MG tablet Take 81 mg by mouth daily. Swallow whole.     celecoxib  (CELEBREX ) 200 MG capsule Take 200 mg by mouth 2 (two) times daily. Per Dr. Claudene     gabapentin  (NEURONTIN ) 300 MG capsule TAKE 1 CAPSULE BY  MOUTH THREE TIMES A DAY 90 capsule 3   Multiple Vitamin (MULTIVITAMIN WITH MINERALS) TABS tablet Take 1 tablet by mouth daily.     rosuvastatin  (CRESTOR ) 5 MG tablet TAKE 1 TABLET (5 MG TOTAL) BY MOUTH DAILY. 90 tablet 2   No facility-administered medications prior to visit.   Past Medical History:  Diagnosis Date   Arthritis    Carotid artery occlusion    Complication of anesthesia    slow to wake up after shoulder surgeries   Hyperlipidemia 03/08/2016   LDL 75 pre stroke. Goal <70 so on atorvastatin  20mg  every other day   Hypertension    Peripheral vascular disease    Pneumonia    Stroke [facility])    still has decreased sensation in left hand and arm (as of 03/09/16)   Past Surgical History:  Procedure Laterality Date   CAROTID ENDARTERECTOMY     CATARACT EXTRACTION W/ INTRAOCULAR LENS IMPLANT Bilateral 2024   ENDARTERECTOMY Right 03/19/2016   Procedure: ENDARTERECTOMY CAROTID;  Surgeon: Carlin FORBES Haddock, MD;  Location: [facility] OR;  Service: Vascular;  Laterality: Right;   PATCH ANGIOPLASTY Right 03/19/2016   Procedure: PATCH ANGIOPLASTY OF RIGHT CAROTID USING HEMASHIELD PLATINUM FINESSE;  Surgeon: Carlin FORBES Haddock, MD;  Location: [facility] OR;  Service: Vascular;  Laterality: Right;   SHOULDER SURGERY Bilateral    left- injections intermittent now  Rotator cuff  repair on both shoulders, 2 different surgeries   TONSILLECTOMY     TOTAL KNEE ARTHROPLASTY Left 12/14/2013   Procedure: LEFT TOTAL KNEE ARTHROPLASTY;  Surgeon: Dempsey Melodi GAILS, MD;  Location: [facility] ORS;  Service: Orthopedics;  Laterality: Left;   Allergies[1]    Objective:    Physical Exam Vitals and nursing note reviewed.  Constitutional:      General: Ricky [name] is not in acute distress.    Appearance: Normal appearance.  HENT:     Head: Normocephalic.  Cardiovascular:     Rate and Rhythm: Normal rate and regular rhythm.  Pulmonary:     Effort: Pulmonary effort is normal.     Breath sounds: Normal breath sounds.  Musculoskeletal:         General: Normal range of motion.     Cervical back: Normal range of motion.  Skin:    General: Skin is warm and dry.     Findings: Abrasion (left anterior knee, approx 2cm in diameter, shallow with serous drg, no s/s infection) present.  Neurological:     Mental Status: Ricky [name] is alert and oriented to person, place, and time.  Psychiatric:        Mood and Affect: Mood normal.    BP 118/62 (BP Location: Left Arm, Patient Position: Sitting, Cuff Size: Normal)   Pulse 67   Temp 97.9 F (36.6 C) (Temporal)   Ht 5' 5 (1.651 m)   Wt 139 lb 3.2 oz (63.1 kg)   SpO2 99%   BMI 23.16 kg/m  Wt Readings from Last 3 Encounters:  02/03/24 139 lb 3.2 oz (63.1 kg)  01/21/24 140 lb 6.4 oz (63.7 kg)  01/14/24 138 lb (62.6 kg)      Tysheena Ginzburg, NP     [1]  Allergies Allergen Reactions   Amoxicillin Rash    Has patient had a PCN reaction causing immediate rash, facial/tongue/throat swelling, SOB or lightheadedness with hypotension:unsure Has patient had a PCN reaction causing severe rash involving mucus membranes or skin necrosis:unsure Has patient had a PCN reaction that required hospitalization No Has patient had a PCN reaction occurring within the last 10 years: No If all of the above answers are NO, then may proceed with Cephalosporin use.    "

## 2024-02-10 ENCOUNTER — Ambulatory Visit: Payer: Self-pay | Admitting: Neurology

## 2024-02-10 DIAGNOSIS — M5417 Radiculopathy, lumbosacral region: Secondary | ICD-10-CM

## 2024-02-10 DIAGNOSIS — R202 Paresthesia of skin: Secondary | ICD-10-CM

## 2024-02-10 NOTE — Procedures (Signed)
 " [facility]  [address] [place], [address]  [place], [place] [address] Tel: [phone] Fax: [phone] Test Date:  02/10/2024  Patient: Ricky [name] DOB: 25-Jan-1946 Physician: Venetia Potters, MD  Sex: Male Height: 5' 5 Ref Phys: Arthea Sharps, DO  ID#: [id number] Temp: 32.1 C Technician:    History: This is an 79 year old male with proximal arm weakness and pain in both legs.  NCV & EMG Findings: Extensive electrodiagnostic evaluation of bilateral lower limbs and right upper limb shows: Bilateral sural and superficial peroneal/fibular sensory responses are absent. Right median and right ulnar sensory responses are within normal limits. Bilateral peroneal/fibular (EDB), bilateral tibial (AH), right median (APB) and right ulnar (ADM) motor responses are within normal limits. Bilateral H reflexes are absent. Chronic motor axon loss changes without accompanying active denervation changes are seen in bilateral tibialis anterior, bilateral gluteus medius, right medial head of gastrocnemius, right short head of biceps femoris, right rectus femoris, and right lumbosacral paraspinal (L5 level) muscles.  Impression: This is an abnormal study. The findings are most consistent with the following: The residuals of old intraspinal canal lesions (ie: motor radiculopathy) at bilateral L5, right L4, and right S1 roots or segments. The findings are moderate in degree electrically at right L5 root and mild in degree electrically at left L5, right L4, and right S1 roots. Absent sensory responses may be normal for age, but cannot completely exclude an overlapping large fiber sensorimotor polyneuropathy. No electrodiagnostic evidence of a right cervical (C5-C8) motor radiculopathy. No electrodiagnostic evidence of myopathy.    ___________________________ Venetia Potters, MD    Nerve Conduction Studies Motor Nerve Results    Latency Amplitude F-Lat Segment Distance CV Comment  Site (ms) Norm  (mV) Norm (ms)  (cm) (m/s) Norm   Left Fibular (EDB) Motor  Ankle 3.9  < 6.0 2.6  > 2.5        Bel fib head 10.4 - 2.5 -  Bel fib head-Ankle 30 46  > 40   Pop fossa 12.4 - 2.5 -  Pop fossa-Bel fib head 9 45 -   Right Fibular (EDB) Motor  Ankle 3.2  < 6.0 4.1  > 2.5        Bel fib head 10.0 - 3.7 -  Bel fib head-Ankle 29.5 43  > 40   Pop fossa 11.8 - 3.6 -  Pop fossa-Bel fib head 9 50 -   Right Median (APB) Motor  Wrist 3.2  < 4.0 5.4  > 5.0        Elbow 7.9 - 3.7 -  Elbow-Wrist 26 55  > 50   Left Tibial (AH) Motor  Ankle 4.0  < 6.0 8.9  > 4.0        Knee 11.7 - 6.9 -  Knee-Ankle 39.5 51  > 40   Right Tibial (AH) Motor  Ankle 4.9  < 6.0 5.6  > 4.0        Knee 11.5 - 4.1 -  Knee-Ankle 37.5 57  > 40   Right Ulnar (ADM) Motor  Wrist 2.7  < 3.1 8.2  > 7.0        Bel elbow 6.6 - 7.8 -  Bel elbow-Wrist 21 54  > 50   Ab elbow 8.6 - 6.5 -  Ab elbow-Bel elbow 10 50 -    Sensory Sites    Neg Peak Lat Amplitude (O-P) Segment Distance Velocity Comment  Site (ms) Norm (V) Norm  (cm) (ms)  Right Median Sensory  Wrist-Dig II 3.8  < 3.8 17  > 10 Wrist-Dig II 13    Left Superficial Fibular Sensory  14 cm-Ankle *NR  < 4.6 *NR  > 3 14 cm-Ankle 14    Right Superficial Fibular Sensory  14 cm-Ankle *NR  < 4.6 *NR  > 3 14 cm-Ankle 14    Left Sural Sensory  Calf-Lat mall *NR  < 4.6 *NR  > 3 Calf-Lat mall 14    Right Sural Sensory  Calf-Lat mall *NR  < 4.6 *NR  > 3 Calf-Lat mall 14    Right Ulnar Sensory  Wrist-Dig V 3.2  < 3.2 14  > 5 Wrist-Dig V 11     H-Reflex Results    M-Lat H Lat H Neg Amp H-M Lat  Site (ms) (ms) Norm (mV) (ms)  Left Tibial H-Reflex  Pop fossa 6.6 -  < 35.0 - -  Right Tibial H-Reflex  Pop fossa 4.8 -  < 35.0 - -   Electromyography   Side Muscle Ins.Act Fibs Fasc Recrt Amp Dur Poly Activation Comment  Right Tib ant Nml Nml Nml *2- *2+ *2+ *1+ Nml N/A  Right Gastroc MH Nml Nml Nml *1- *1+ *1+ *1+ Nml N/A  Right Rectus fem Nml Nml Nml *2- *1+ *1+ *1+ Nml N/A  Right  Add longus Nml Nml Nml Nml Nml Nml Nml Nml N/A  Right Biceps fem SH Nml Nml Nml *1- *1+ *1+ *1+ Nml N/A  Right Gluteus med Nml Nml Nml *1- *1+ *1+ Nml Nml N/A  Right L5 PSP Nml Nml Nml *2- *1+ *1+ *1+ Nml N/A  Right FDI Nml Nml Nml Nml Nml Nml Nml Nml N/A  Right Pronator teres Nml Nml Nml Nml Nml Nml Nml Nml N/A  Right Biceps Nml Nml Nml Nml Nml Nml Nml Nml N/A  Right Triceps lat hd Nml Nml Nml Nml Nml Nml Nml Nml N/A  Right Deltoid Nml Nml Nml Nml Nml Nml Nml Nml N/A  Right Infraspin Nml Nml Nml Nml Nml Nml Nml Nml N/A  Left Tib ant Nml Nml Nml *1- *1+ *1+ Nml Nml N/A  Left Gastroc MH Nml Nml Nml Nml Nml Nml Nml Nml N/A  Left Rectus fem Nml Nml Nml Nml Nml Nml Nml Nml N/A  Left Biceps fem SH Nml Nml Nml Nml Nml Nml Nml Nml N/A  Left Gluteus med Nml Nml Nml *1- *1+ *1+ Nml Nml N/A      Waveforms:  Motor               Sensory               H-Reflex      "

## 2024-02-11 ENCOUNTER — Ambulatory Visit: Payer: Self-pay

## 2024-02-11 ENCOUNTER — Ambulatory Visit: Payer: Self-pay | Admitting: Family Medicine

## 2024-02-11 NOTE — Telephone Encounter (Signed)
 ATC x1.  LVM to return call.  It is time for an OV with Dr. Pawar.  When he calls back, please schedule OV with Dr. Pawar (30 Min) slot.

## 2024-02-11 NOTE — Telephone Encounter (Signed)
 Copied from CRM [id number]. Topic: Clinical - Red Word Triage >> Feb 11, 2024  1:29 PM Rilla B wrote: Red Word that prompted transfer to Nurse Triage: Patient just received CPAP machine.  Patient states when trying to use it, he has a hard time breathing and experience shortness of breath. He states it does take a moment for him to catch his breath after he takes it off. Answer Assessment - Initial Assessment Questions 1. REASON FOR CALL: What is the main reason for your call? or How can I best help you?   Patient reports got new CPAP and tried on Friday; caused shortness of breath. Contact CPAP customer service and was told to contact Pulmonologist. Unsure if customer service troubleshot device.   Denies any symptoms currently and reports sob only when using device.  Patient requesting call back and further recommendations.   Advised call back or ED/911 if symptoms occur/worsen: severe diff breathing, chest pain > 5 min, faint. Patient verbalized understanding.  Protocols used: Information Only Call - No Triage-A-AH

## 2024-02-12 ENCOUNTER — Telehealth: Payer: Self-pay

## 2024-02-12 ENCOUNTER — Other Ambulatory Visit: Payer: Self-pay

## 2024-02-12 DIAGNOSIS — M5416 Radiculopathy, lumbar region: Secondary | ICD-10-CM

## 2024-02-12 NOTE — Telephone Encounter (Signed)
 Copied from CRM [id number]. Topic: Clinical - Medical Advice >> Feb 10, 2024 11:51 AM Devaughn RAMAN wrote: Reason for CRM: Pt stated he has difficulty breathing after utilizing CPAP machine, pt stated he has stopped using the machine due to the difficulty. Pt stated he wasn't breathing easily even with the mask on, pt is worried about putting the mask on and worried about utilizing it.   Tried to reach out to patient VM / LM - has up coming appointment with provider on 02/14/24 he can discuss his concerns and best way to move forward

## 2024-02-12 NOTE — Telephone Encounter (Signed)
 Patient called back and scheduled an OV with Tammy on 02/14/24 at 10 am.  Nothing further needed.

## 2024-02-14 ENCOUNTER — Ambulatory Visit: Admitting: Adult Health

## 2024-02-14 ENCOUNTER — Encounter: Payer: Self-pay | Admitting: Adult Health

## 2024-02-14 VITALS — BP 130/71 | HR 59 | Temp 97.8°F | Ht 65.0 in | Wt 140.2 lb

## 2024-02-14 DIAGNOSIS — G4733 Obstructive sleep apnea (adult) (pediatric): Secondary | ICD-10-CM

## 2024-02-14 NOTE — Progress Notes (Signed)
 "  @Patient  ID: Ricky [name]., male    DOB: [birth year], 79 y.o.   MRN: [id number]  Chief Complaint  Patient presents with   Acute Visit    OSA concerns    Referring provider: Katrinka Garnette KIDD, MD  HPI: 79 year old male never smoker followed for obstructive sleep apnea [name] patient    TEST/EVENTS : Reviewed 02/14/2024  HST- 09/20/23- AHI 49.9/hr, desat to 85%, body weight 132 lbs  Discussed the use of AI scribe software for clinical note transcription with the patient, who gave verbal consent to proceed.  02/14/2024 Follow up : OSA Patient presents for a follow-up visit.  Patient was seen last year for consult for snoring and found to have severe obstructive sleep apnea.  Sleep study on September 20, 2023 showed AHI of 49.9/hour and SpO2 low to 85%.  Patient was started on CPAP.  Patient says he recently received a CPAP and has had great difficulty tolerating.  Feels that he is suffocating.  Has been using a fullface mask.  Has had difficulty tolerating.  CPAP download shows minimum use with large mask leaks.  Patient is on auto CPAP 5 to 20 cm H2O.  Current weight is 140 pounds with a BMI of 23.   Allergies[1]  Immunization History  Administered Date(s) Administered   INFLUENZA, HIGH DOSE SEASONAL PF 01/10/2016, 11/12/2016, 12/10/2017, 09/28/2018, 10/18/2019, 11/11/2020, 11/10/2021, 10/28/2022, 11/03/2023   Influenza,inj,Quad PF,6+ Mos 11/30/2013, 12/20/2014   Moderna Covid-19 Fall Seasonal Vaccine 24yrs & older 10/28/2022   Moderna SARS-COV2 Booster Vaccination 11/03/2023   PFIZER Comirnaty(Gray Top)Covid-19 Tri-Sucrose Vaccine 05/08/2020, 11/10/2021   PFIZER(Purple Top)SARS-COV-2 Vaccination 02/16/2019, 03/07/2019, 11/11/2019   Pfizer Covid-19 Vaccine Bivalent Booster 68yrs & up 11/11/2020, 07/06/2021   Pneumococcal Conjugate-13 11/30/2013   Pneumococcal Polysaccharide-23 12/20/2014   Respiratory Syncytial Virus Vaccine,Recomb Aduvanted(Arexvy) 12/01/2021   Td  04/20/2014   Tdap 10/28/2023   Zoster Recombinant(Shingrix) 09/27/2018, 12/11/2018    Past Medical History:  Diagnosis Date   Arthritis    Carotid artery occlusion    Complication of anesthesia    slow to wake up after shoulder surgeries   Hyperlipidemia 03/08/2016   LDL 75 pre stroke. Goal <70 so on atorvastatin  20mg  every other day   Hypertension    Peripheral vascular disease    Pneumonia    Stroke [facility])    still has decreased sensation in left hand and arm (as of 03/09/16)    Tobacco History: Tobacco Use History[2] Counseling given: Not Answered   Outpatient Medications Prior to Visit  Medication Sig Dispense Refill   acetaminophen  (TYLENOL ) 500 MG tablet Take 500-1,000 mg by mouth every 6 (six) hours as needed (for pain.).      albuterol  (VENTOLIN  HFA) 108 (90 Base) MCG/ACT inhaler Inhale 2 puffs into the lungs every 6 (six) hours as needed for wheezing or shortness of breath. 1 each 0   amLODipine  (NORVASC ) 5 MG tablet Take 1 tablet (5 mg total) by mouth daily. 90 tablet 3   aspirin  EC 81 MG tablet Take 81 mg by mouth daily. Swallow whole.     gabapentin  (NEURONTIN ) 300 MG capsule TAKE 1 CAPSULE BY MOUTH THREE TIMES A DAY 90 capsule 3   Multiple Vitamin (MULTIVITAMIN WITH MINERALS) TABS tablet Take 1 tablet by mouth daily.     rosuvastatin  (CRESTOR ) 5 MG tablet TAKE 1 TABLET (5 MG TOTAL) BY MOUTH DAILY. 90 tablet 2   celecoxib  (CELEBREX ) 200 MG capsule Take 200 mg by mouth 2 (two) times daily. Per  Dr. Claudene (Patient not taking: Reported on 02/14/2024)     No facility-administered medications prior to visit.     Review of Systems:   Constitutional:   No  weight loss, night sweats,  Fevers, chills,+ fatigue, or  lassitude.  HEENT:   No headaches,  Difficulty swallowing,  Tooth/dental problems, or  Sore throat,                No sneezing, itching, ear ache, nasal congestion, post nasal drip,   CV:  No chest pain,  Orthopnea, PND, swelling in lower extremities, anasarca,  dizziness, palpitations, syncope.   GI  No heartburn, indigestion, abdominal pain, nausea, vomiting, diarrhea, change in bowel habits, loss of appetite, bloody stools.   Resp:   No chest wall deformity  Skin: no rash or lesions.  GU: no dysuria, change in color of urine, no urgency or frequency.  No flank pain, no hematuria   MS:  No joint pain or swelling.  No decreased range of motion.  No back pain.    Physical Exam  BP (!) 167/94   Pulse (!) 59   Temp 97.8 F (36.6 C)   Ht 5' 5 (1.651 m) Comment: Per pt  Wt 140 lb 3.2 oz (63.6 kg)   SpO2 96% Comment: RA  BMI 23.33 kg/m   GEN: A/Ox3; pleasant , NAD, well nourished    HEENT:  [place]/AT,   , NOSE-clear, THROAT-clear, no lesions, no postnasal drip or exudate noted.   NECK:  Supple w/ fair ROM; no JVD; normal carotid impulses w/o bruits; no thyromegaly or nodules palpated; no lymphadenopathy.    RESP  Clear  P & A; w/o, wheezes/ rales/ or rhonchi. no accessory muscle use, no dullness to percussion  CARD:  RRR, no m/r/g, no peripheral edema, pulses intact, no cyanosis or clubbing.  GI:   Soft & nt; nml bowel sounds; no organomegaly or masses detected.   Musco: Warm bil, no deformities or joint swelling noted.   Neuro: alert, no focal deficits noted.    Skin: Warm, no lesions or rashes    Lab Results:Reviewed 02/14/2024   CBC    BNP No results found for: BNP  ProBNP No results found for: PROBNP  Imaging:   Administration History     None           No data to display          No results found for: NITRICOXIDE      No data to display              Assessment & Plan:   Assessment and Plan Severe obstructive sleep apnea.  Recently diagnosed difficulty tolerating CPAP. Long discussion with patient regarding sleep apnea and CPAP usage.  Will adjust CPAP pressure for comfort changed to auto CPAP 5 to 10 cm.  Changed to ResMed N30 I nasal mask sample was given.  If still unable to  tolerate could consider CPAP titration study.  Also discussed alternative options such as inspire device.  For now continue to try to get comfortable with CPAP usage.  Plan . Patient Instructions  Try to wear CPAP At bedtime all night long,for at least 6hr or more Try Resmed N30i nasal mask.  Do not drive if sleepy  Use caution with sedating medications Change CPAP pressure 5-10cmH2o  Follow up in 1 month and As needed            Madelin Stank, NP 02/14/2024      [  1]  Allergies Allergen Reactions   Amoxicillin Rash    Has patient had a PCN reaction causing immediate rash, facial/tongue/throat swelling, SOB or lightheadedness with hypotension:unsure Has patient had a PCN reaction causing severe rash involving mucus membranes or skin necrosis:unsure Has patient had a PCN reaction that required hospitalization No Has patient had a PCN reaction occurring within the last 10 years: No If all of the above answers are NO, then may proceed with Cephalosporin use.   [2]  Social History Tobacco Use  Smoking Status Never   Passive exposure: Never  Smokeless Tobacco Never   "

## 2024-02-14 NOTE — Patient Instructions (Addendum)
 Try to wear CPAP At bedtime all night long,for at least 6hr or more Try Resmed N30i nasal mask.  Do not drive if sleepy  Use caution with sedating medications Change CPAP pressure 5-10cmH2o  Follow up in 1 month and As needed

## 2024-02-27 ENCOUNTER — Other Ambulatory Visit

## 2024-02-27 NOTE — Discharge Instructions (Signed)

## 2024-02-28 ENCOUNTER — Ambulatory Visit
Admission: RE | Admit: 2024-02-28 | Discharge: 2024-02-28 | Disposition: A | Source: Ambulatory Visit | Attending: Family Medicine | Admitting: Family Medicine

## 2024-02-28 DIAGNOSIS — M5416 Radiculopathy, lumbar region: Secondary | ICD-10-CM

## 2024-02-28 MED ORDER — IOPAMIDOL (ISOVUE-M 200) INJECTION 41%
1.0000 mL | Freq: Once | INTRAMUSCULAR | Status: AC
  Administered 2024-02-28: 1 mL via EPIDURAL

## 2024-02-28 MED ORDER — METHYLPREDNISOLONE ACETATE 40 MG/ML INJ SUSP (RADIOLOG
80.0000 mg | Freq: Once | INTRAMUSCULAR | Status: AC
  Administered 2024-02-28: 80 mg via EPIDURAL

## 2024-03-04 ENCOUNTER — Ambulatory Visit: Admitting: Physician Assistant

## 2024-03-04 ENCOUNTER — Ambulatory Visit: Admitting: Neurology

## 2024-03-12 ENCOUNTER — Ambulatory Visit: Admitting: Adult Health

## 2024-03-12 ENCOUNTER — Encounter: Payer: Self-pay | Admitting: Adult Health

## 2024-03-12 VITALS — BP 110/70 | HR 60 | Ht 65.0 in | Wt 139.0 lb

## 2024-03-12 DIAGNOSIS — G4733 Obstructive sleep apnea (adult) (pediatric): Secondary | ICD-10-CM

## 2024-03-12 NOTE — Progress Notes (Signed)
 "  @Patient  ID: Ricky [name]., male    DOB: 1945/08/25, 79 y.o.   MRN: [id number]  Chief Complaint  Patient presents with   Sleep Apnea    F/u    Referring provider: Katrinka Garnette MALVA, MD  HPI: 79 year old male never smoker followed for obstructive sleep apnea Former patient of Dr. Saundra    TEST/EVENTS : Reviewed 03/12/2024   HST- 09/20/23- AHI 49.9/hr, desat to 85%, body weight 132 lbs    Discussed the use of AI scribe software for clinical note transcription with the patient, who gave verbal consent to proceed.  History of Present Illness Ricky [name]. is an 79 year old male with sleep apnea who presents with issues related to CPAP usage.  He has been experiencing difficulties with CPAP therapy due to mask leakage. He has been using a full face mask for the past two weeks, with his wife assisting him each night. Despite this, the mask often leaks, especially when he detaches and reattaches the hose after waking up around 2 AM to use the bathroom.  He has been using the CPAP machine consistently for the past two weeks, which is an improvement from his previous usage. Pressure adjustments have made the machine more tolerable. However, he is concerned about the mask leakage affecting treatment efficacy, as his apnea episodes remain high, with recent nights showing over 20 episodes, although some nights have been as low as two.  He feels tired and sleepy after meals, attributing this to the amount of food consumed. He has also experienced cold symptoms, including frequent sneezing, which he finds bothersome. He has not been using any nasal sprays or gels to alleviate these symptoms.  CPAP download shows excellent usage over the last 2 weeks with daily average usage at 5.5 hours.  Patient is on auto CPAP 5 to 10 cm H2O.  AHI 20/hour with daily average pressure at 7.3 cm H2O.  We discussed changing his current fullface mask as he has significant mask leaks and tried the  nasal mask that he was given last visit.  Will also adjust CPAP pressure in hopes to decrease number of residual episodes.   Allergies[1]  Immunization History  Administered Date(s) Administered   INFLUENZA, HIGH DOSE SEASONAL PF 01/10/2016, 11/12/2016, 12/10/2017, 09/28/2018, 10/18/2019, 11/11/2020, 11/10/2021, 10/28/2022, 11/03/2023   Influenza,inj,Quad PF,6+ Mos 11/30/2013, 12/20/2014   Moderna Covid-19 Fall Seasonal Vaccine 33yrs & older 10/28/2022   Moderna SARS-COV2 Booster Vaccination 11/03/2023   PFIZER Comirnaty(Gray Top)Covid-19 Tri-Sucrose Vaccine 05/08/2020, 11/10/2021   PFIZER(Purple Top)SARS-COV-2 Vaccination 02/16/2019, 03/07/2019, 11/11/2019   Pfizer Covid-19 Vaccine Bivalent Booster 73yrs & up 11/11/2020, 07/06/2021   Pneumococcal Conjugate-13 11/30/2013   Pneumococcal Polysaccharide-23 12/20/2014   Respiratory Syncytial Virus Vaccine,Recomb Aduvanted(Arexvy) 12/01/2021   Td 04/20/2014   Tdap 10/28/2023   Zoster Recombinant(Shingrix) 09/27/2018, 12/11/2018    Past Medical History:  Diagnosis Date   Arthritis    Carotid artery occlusion    Complication of anesthesia    slow to wake up after shoulder surgeries   Hyperlipidemia 03/08/2016   LDL 75 pre stroke. Goal <70 so on atorvastatin  20mg  every other day   Hypertension    Peripheral vascular disease    Pneumonia    Stroke [facility])    still has decreased sensation in left hand and arm (as of 03/09/16)    Tobacco History: Tobacco Use History[2] Counseling given: Not Answered   Outpatient Medications Prior to Visit  Medication Sig Dispense Refill   acetaminophen  (TYLENOL ) 500 MG  tablet Take 500-1,000 mg by mouth every 6 (six) hours as needed (for pain.).      albuterol  (VENTOLIN  HFA) 108 (90 Base) MCG/ACT inhaler Inhale 2 puffs into the lungs every 6 (six) hours as needed for wheezing or shortness of breath. 1 each 0   amLODipine  (NORVASC ) 5 MG tablet Take 1 tablet (5 mg total) by mouth daily. 90 tablet 3    aspirin  EC 81 MG tablet Take 81 mg by mouth daily. Swallow whole.     gabapentin  (NEURONTIN ) 300 MG capsule TAKE 1 CAPSULE BY MOUTH THREE TIMES A DAY 90 capsule 3   Multiple Vitamin (MULTIVITAMIN WITH MINERALS) TABS tablet Take 1 tablet by mouth daily.     rosuvastatin  (CRESTOR ) 5 MG tablet TAKE 1 TABLET (5 MG TOTAL) BY MOUTH DAILY. 90 tablet 2   celecoxib  (CELEBREX ) 200 MG capsule Take 200 mg by mouth 2 (two) times daily. Per Dr. Claudene (Patient not taking: Reported on 02/14/2024)     No facility-administered medications prior to visit.     Review of Systems:   Constitutional:   No  weight loss, night sweats,  Fevers, chills,+ fatigue, or  lassitude.  HEENT:   No headaches,  Difficulty swallowing,  Tooth/dental problems, or  Sore throat,                No sneezing, itching, ear ache, nasal congestion, post nasal drip,   CV:  No chest pain,  Orthopnea, PND, swelling in lower extremities, anasarca, dizziness, palpitations, syncope.   GI  No heartburn, indigestion, abdominal pain, nausea, vomiting, diarrhea, change in bowel habits, loss of appetite, bloody stools.   Resp: No shortness of breath with exertion or at rest.  No excess mucus, no productive cough,  No non-productive cough,  No coughing up of blood.  No change in color of mucus.  No wheezing.  No chest wall deformity  Skin: no rash or lesions.  GU: no dysuria, change in color of urine, no urgency or frequency.  No flank pain, no hematuria   MS:  No joint pain or swelling.  No decreased range of motion.  No back pain.    Physical Exam  BP 110/70   Pulse 60   Ht 5' 5 (1.651 m) Comment: per patient  Wt 139 lb (63 kg)   SpO2 96%   BMI 23.13 kg/m   GEN: A/Ox3; pleasant , NAD, well nourished    HEENT:  [place]/AT,  NOSE-clear, THROAT-clear, no lesions, no postnasal drip or exudate noted.   NECK:  Supple w/ fair ROM; no JVD; normal carotid impulses w/o bruits; no thyromegaly or nodules palpated; no lymphadenopathy.    RESP   Clear  P & A; w/o, wheezes/ rales/ or rhonchi. no accessory muscle use, no dullness to percussion  CARD:  RRR, no m/r/g, no peripheral edema, pulses intact, no cyanosis or clubbing.  GI:   Soft & nt; nml bowel sounds; no organomegaly or masses detected.   Musco: Warm bil, no deformities or joint swelling noted.   Neuro: alert, no focal deficits noted.    Skin: Warm, no lesions or rashes    Lab Results:Reviewed 03/12/2024     BMET   BNP No results found for: BNP  ProBNP No results found for: PROBNP   Administration History     None           No data to display          No results found for: NITRICOXIDE  No data to display              Assessment & Plan:   Assessment and Plan Assessment & Plan Severe obstructive sleep apnea  -improved usage and tolerance on CPAP.  Does have significant residual episodes and mask leaking.  We discussed setting up for CPAP titration study but he wants to hold off and continue with some adjustments at home. He has shown improvement in CPAP tolerance over the last two weeks. . Pressure settings were adjusted previously and are now tolerable. Usage has increased to five hours per night,   Try  a nasal mask to improve seal and reduce leakage. Adjust CPAP pressure settings to optimize comfort and efficacy. Use saline nasal spray and gel to alleviate nasal congestion. Follow up in two months to reassess CPAP efficacy and comfort. Changed to auto CPAP 6 to 12 cm H2O.  Plan Patient Instructions  Try to wear CPAP At bedtime all night long,for at least 6hr or more Try Resmed N30i nasal mask.  Do not drive if sleepy  Use caution with sedating medications Change CPAP pressure 6-12cmH2o  Saline nasal spray  Twice daily   Saline nasal gel At bedtime   Follow up in 2 month and As needed             Madelin Stank, NP 03/12/2024     [1]  Allergies Allergen Reactions   Amoxicillin Rash    Has patient had a PCN  reaction causing immediate rash, facial/tongue/throat swelling, SOB or lightheadedness with hypotension:unsure Has patient had a PCN reaction causing severe rash involving mucus membranes or skin necrosis:unsure Has patient had a PCN reaction that required hospitalization No Has patient had a PCN reaction occurring within the last 10 years: No If all of the above answers are NO, then may proceed with Cephalosporin use.   [2]  Social History Tobacco Use  Smoking Status Never   Passive exposure: Never  Smokeless Tobacco Never   "

## 2024-03-12 NOTE — Patient Instructions (Signed)
 Try to wear CPAP At bedtime all night long,for at least 6hr or more Try Resmed N30i nasal mask.  Do not drive if sleepy  Use caution with sedating medications Change CPAP pressure 6-12cmH2o  Saline nasal spray  Twice daily   Saline nasal gel At bedtime   Follow up in 2 month and As needed

## 2024-03-24 ENCOUNTER — Ambulatory Visit: Admitting: Family Medicine

## 2024-04-16 ENCOUNTER — Ambulatory Visit

## 2024-04-16 ENCOUNTER — Ambulatory Visit (HOSPITAL_COMMUNITY)

## 2024-04-20 ENCOUNTER — Ambulatory Visit: Admitting: Family Medicine

## 2024-05-12 ENCOUNTER — Ambulatory Visit: Admitting: Adult Health

## 2024-05-25 ENCOUNTER — Institutional Professional Consult (permissible substitution): Admitting: Psychology

## 2024-05-25 ENCOUNTER — Ambulatory Visit: Payer: Self-pay

## 2024-06-01 ENCOUNTER — Encounter: Admitting: Psychology

## 2024-06-09 ENCOUNTER — Ambulatory Visit: Admitting: Physician Assistant

## 2024-07-06 ENCOUNTER — Ambulatory Visit
